# Patient Record
Sex: Female | Born: 1950
Health system: Southern US, Community
[De-identification: ages and names within clinical notes are randomized; demographics above are authoritative.]

## PROBLEM LIST (undated history)

## (undated) DIAGNOSIS — D751 Secondary polycythemia: Secondary | ICD-10-CM

## (undated) DIAGNOSIS — Z87410 Personal history of cervical dysplasia: Secondary | ICD-10-CM

## (undated) DIAGNOSIS — M810 Age-related osteoporosis without current pathological fracture: Secondary | ICD-10-CM

## (undated) DIAGNOSIS — K529 Noninfective gastroenteritis and colitis, unspecified: Secondary | ICD-10-CM

## (undated) DIAGNOSIS — T8859XA Other complications of anesthesia, initial encounter: Secondary | ICD-10-CM

## (undated) DIAGNOSIS — S62609A Fracture of unspecified phalanx of unspecified finger, initial encounter for closed fracture: Secondary | ICD-10-CM

## (undated) DIAGNOSIS — Z8673 Personal history of transient ischemic attack (TIA), and cerebral infarction without residual deficits: Secondary | ICD-10-CM

## (undated) DIAGNOSIS — K52832 Lymphocytic colitis: Secondary | ICD-10-CM

## (undated) DIAGNOSIS — T4145XA Adverse effect of unspecified anesthetic, initial encounter: Secondary | ICD-10-CM

## (undated) DIAGNOSIS — I10 Essential (primary) hypertension: Secondary | ICD-10-CM

## (undated) DIAGNOSIS — G4730 Sleep apnea, unspecified: Secondary | ICD-10-CM

## (undated) DIAGNOSIS — E785 Hyperlipidemia, unspecified: Secondary | ICD-10-CM

## (undated) DIAGNOSIS — J45909 Unspecified asthma, uncomplicated: Secondary | ICD-10-CM

## (undated) DIAGNOSIS — F17200 Nicotine dependence, unspecified, uncomplicated: Secondary | ICD-10-CM

## (undated) DIAGNOSIS — Z9889 Other specified postprocedural states: Secondary | ICD-10-CM

## (undated) DIAGNOSIS — K449 Diaphragmatic hernia without obstruction or gangrene: Secondary | ICD-10-CM

## (undated) DIAGNOSIS — H3530 Unspecified macular degeneration: Secondary | ICD-10-CM

## (undated) DIAGNOSIS — Z8489 Family history of other specified conditions: Secondary | ICD-10-CM

## (undated) DIAGNOSIS — F411 Generalized anxiety disorder: Secondary | ICD-10-CM

## (undated) DIAGNOSIS — Z9289 Personal history of other medical treatment: Secondary | ICD-10-CM

## (undated) DIAGNOSIS — J439 Emphysema, unspecified: Secondary | ICD-10-CM

## (undated) DIAGNOSIS — G4733 Obstructive sleep apnea (adult) (pediatric): Secondary | ICD-10-CM

## (undated) DIAGNOSIS — F419 Anxiety disorder, unspecified: Secondary | ICD-10-CM

## (undated) DIAGNOSIS — R002 Palpitations: Secondary | ICD-10-CM

## (undated) DIAGNOSIS — R112 Nausea with vomiting, unspecified: Secondary | ICD-10-CM

## (undated) DIAGNOSIS — N879 Dysplasia of cervix uteri, unspecified: Secondary | ICD-10-CM

## (undated) DIAGNOSIS — I4729 Other ventricular tachycardia: Secondary | ICD-10-CM

## (undated) DIAGNOSIS — M8580 Other specified disorders of bone density and structure, unspecified site: Secondary | ICD-10-CM

## (undated) DIAGNOSIS — J189 Pneumonia, unspecified organism: Secondary | ICD-10-CM

## (undated) DIAGNOSIS — K219 Gastro-esophageal reflux disease without esophagitis: Secondary | ICD-10-CM

## (undated) HISTORY — DX: Anxiety disorder, unspecified: F41.9

## (undated) HISTORY — PX: ROTATOR CUFF REPAIR: SHX139

## (undated) HISTORY — PX: TONSILLECTOMY: SUR1361

## (undated) HISTORY — PX: NOSE SURGERY: SHX723

## (undated) HISTORY — PX: OOPHORECTOMY: SHX86

## (undated) HISTORY — PX: SHOULDER SURGERY: SHX246

## (undated) HISTORY — PX: GYNECOLOGIC CRYOSURGERY: SHX857

## (undated) HISTORY — PX: COLPOSCOPY: SHX161

## (undated) HISTORY — DX: Hyperlipidemia, unspecified: E78.5

## (undated) HISTORY — DX: Other specified disorders of bone density and structure, unspecified site: M85.80

## (undated) HISTORY — DX: Dysplasia of cervix uteri, unspecified: N87.9

## (undated) HISTORY — PX: THROAT SURGERY: SHX803

## (undated) HISTORY — DX: Personal history of other medical treatment: Z92.89

---
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
## 1995-04-27 HISTORY — PX: THROAT SURGERY: SHX803

## 1997-11-12 ENCOUNTER — Other Ambulatory Visit: Admission: RE | Admit: 1997-11-12 | Discharge: 1997-11-12 | Payer: Self-pay | Admitting: Obstetrics and Gynecology

## 1997-12-14 ENCOUNTER — Emergency Department (HOSPITAL_COMMUNITY): Admission: EM | Admit: 1997-12-14 | Discharge: 1997-12-14 | Payer: Self-pay | Admitting: Emergency Medicine

## 1997-12-25 ENCOUNTER — Inpatient Hospital Stay (HOSPITAL_COMMUNITY): Admission: EM | Admit: 1997-12-25 | Discharge: 1997-12-29 | Payer: Self-pay | Admitting: Emergency Medicine

## 1997-12-25 ENCOUNTER — Encounter: Payer: Self-pay | Admitting: Emergency Medicine

## 1998-01-29 ENCOUNTER — Encounter: Admission: RE | Admit: 1998-01-29 | Discharge: 1998-01-29 | Payer: Self-pay | Admitting: Infectious Diseases

## 1998-01-31 ENCOUNTER — Ambulatory Visit (HOSPITAL_COMMUNITY): Admission: RE | Admit: 1998-01-31 | Discharge: 1998-01-31 | Payer: Self-pay | Admitting: Infectious Diseases

## 1998-04-11 ENCOUNTER — Encounter: Payer: Self-pay | Admitting: Internal Medicine

## 1998-04-11 ENCOUNTER — Ambulatory Visit (HOSPITAL_COMMUNITY): Admission: RE | Admit: 1998-04-11 | Discharge: 1998-04-11 | Payer: Self-pay | Admitting: Internal Medicine

## 1998-04-26 HISTORY — PX: VAGINAL HYSTERECTOMY: SUR661

## 1998-04-30 ENCOUNTER — Ambulatory Visit (HOSPITAL_COMMUNITY): Admission: RE | Admit: 1998-04-30 | Discharge: 1998-04-30 | Payer: Self-pay | Admitting: Internal Medicine

## 1998-06-05 ENCOUNTER — Ambulatory Visit (HOSPITAL_COMMUNITY): Admission: RE | Admit: 1998-06-05 | Discharge: 1998-06-05 | Payer: Self-pay | Admitting: Internal Medicine

## 1998-06-05 ENCOUNTER — Encounter: Payer: Self-pay | Admitting: Internal Medicine

## 1998-08-11 ENCOUNTER — Other Ambulatory Visit: Admission: RE | Admit: 1998-08-11 | Discharge: 1998-08-11 | Payer: Self-pay | Admitting: Obstetrics and Gynecology

## 1998-08-27 ENCOUNTER — Encounter: Payer: Self-pay | Admitting: Internal Medicine

## 1998-08-27 ENCOUNTER — Ambulatory Visit (HOSPITAL_COMMUNITY): Admission: RE | Admit: 1998-08-27 | Discharge: 1998-08-27 | Payer: Self-pay | Admitting: Internal Medicine

## 1998-10-01 ENCOUNTER — Ambulatory Visit (HOSPITAL_COMMUNITY): Admission: RE | Admit: 1998-10-01 | Discharge: 1998-10-01 | Payer: Self-pay | Admitting: Obstetrics and Gynecology

## 1998-12-23 ENCOUNTER — Other Ambulatory Visit: Admission: RE | Admit: 1998-12-23 | Discharge: 1998-12-23 | Payer: Self-pay | Admitting: Obstetrics and Gynecology

## 1999-01-14 ENCOUNTER — Encounter: Payer: Self-pay | Admitting: Internal Medicine

## 1999-01-14 ENCOUNTER — Ambulatory Visit (HOSPITAL_COMMUNITY): Admission: RE | Admit: 1999-01-14 | Discharge: 1999-01-14 | Payer: Self-pay | Admitting: Internal Medicine

## 1999-01-22 ENCOUNTER — Inpatient Hospital Stay (HOSPITAL_COMMUNITY): Admission: RE | Admit: 1999-01-22 | Discharge: 1999-01-24 | Payer: Self-pay | Admitting: Obstetrics and Gynecology

## 1999-02-05 ENCOUNTER — Encounter: Payer: Self-pay | Admitting: Obstetrics and Gynecology

## 1999-02-05 ENCOUNTER — Ambulatory Visit (HOSPITAL_COMMUNITY): Admission: RE | Admit: 1999-02-05 | Discharge: 1999-02-05 | Payer: Self-pay | Admitting: Obstetrics and Gynecology

## 1999-06-27 ENCOUNTER — Encounter: Payer: Self-pay | Admitting: Gastroenterology

## 1999-06-27 ENCOUNTER — Ambulatory Visit (HOSPITAL_COMMUNITY): Admission: RE | Admit: 1999-06-27 | Discharge: 1999-06-27 | Payer: Self-pay | Admitting: Gastroenterology

## 1999-09-28 ENCOUNTER — Encounter (INDEPENDENT_AMBULATORY_CARE_PROVIDER_SITE_OTHER): Payer: Self-pay

## 1999-09-28 ENCOUNTER — Ambulatory Visit (HOSPITAL_COMMUNITY): Admission: RE | Admit: 1999-09-28 | Discharge: 1999-09-28 | Payer: Self-pay | Admitting: Gastroenterology

## 1999-12-16 ENCOUNTER — Ambulatory Visit (HOSPITAL_COMMUNITY): Admission: RE | Admit: 1999-12-16 | Discharge: 1999-12-16 | Payer: Self-pay | Admitting: Neurosurgery

## 1999-12-16 ENCOUNTER — Encounter: Payer: Self-pay | Admitting: Neurosurgery

## 2000-02-28 ENCOUNTER — Encounter: Payer: Self-pay | Admitting: Internal Medicine

## 2000-02-28 ENCOUNTER — Inpatient Hospital Stay (HOSPITAL_COMMUNITY): Admission: EM | Admit: 2000-02-28 | Discharge: 2000-03-01 | Payer: Self-pay | Admitting: *Deleted

## 2000-02-29 ENCOUNTER — Encounter: Payer: Self-pay | Admitting: Internal Medicine

## 2000-04-04 ENCOUNTER — Other Ambulatory Visit: Admission: RE | Admit: 2000-04-04 | Discharge: 2000-04-04 | Payer: Self-pay | Admitting: Obstetrics and Gynecology

## 2000-04-26 HISTORY — PX: BACK SURGERY: SHX140

## 2000-07-30 ENCOUNTER — Ambulatory Visit (HOSPITAL_COMMUNITY): Admission: RE | Admit: 2000-07-30 | Discharge: 2000-07-30 | Payer: Self-pay | Admitting: Neurosurgery

## 2000-07-30 ENCOUNTER — Encounter: Payer: Self-pay | Admitting: Neurosurgery

## 2000-07-31 ENCOUNTER — Encounter: Payer: Self-pay | Admitting: Neurosurgery

## 2000-08-12 ENCOUNTER — Encounter: Payer: Self-pay | Admitting: Neurosurgery

## 2000-08-15 ENCOUNTER — Inpatient Hospital Stay (HOSPITAL_COMMUNITY): Admission: RE | Admit: 2000-08-15 | Discharge: 2000-08-16 | Payer: Self-pay | Admitting: Neurosurgery

## 2000-08-15 ENCOUNTER — Encounter: Payer: Self-pay | Admitting: Neurosurgery

## 2000-08-15 HISTORY — PX: LUMBAR DISC SURGERY: SHX700

## 2000-09-07 ENCOUNTER — Ambulatory Visit (HOSPITAL_COMMUNITY): Admission: RE | Admit: 2000-09-07 | Discharge: 2000-09-07 | Payer: Self-pay | Admitting: Neurosurgery

## 2000-09-07 ENCOUNTER — Encounter: Payer: Self-pay | Admitting: Neurosurgery

## 2001-04-07 ENCOUNTER — Other Ambulatory Visit: Admission: RE | Admit: 2001-04-07 | Discharge: 2001-04-07 | Payer: Self-pay | Admitting: Obstetrics and Gynecology

## 2001-10-10 ENCOUNTER — Encounter: Payer: Self-pay | Admitting: Internal Medicine

## 2001-10-10 ENCOUNTER — Ambulatory Visit (HOSPITAL_COMMUNITY): Admission: RE | Admit: 2001-10-10 | Discharge: 2001-10-10 | Payer: Self-pay | Admitting: Internal Medicine

## 2001-10-24 ENCOUNTER — Encounter: Payer: Self-pay | Admitting: *Deleted

## 2001-10-24 ENCOUNTER — Encounter: Admission: RE | Admit: 2001-10-24 | Discharge: 2001-10-24 | Payer: Self-pay | Admitting: *Deleted

## 2002-01-30 ENCOUNTER — Ambulatory Visit (HOSPITAL_COMMUNITY): Admission: RE | Admit: 2002-01-30 | Discharge: 2002-01-30 | Payer: Self-pay | Admitting: *Deleted

## 2002-04-26 HISTORY — PX: NASAL SINUS SURGERY: SHX719

## 2002-05-25 ENCOUNTER — Other Ambulatory Visit: Admission: RE | Admit: 2002-05-25 | Discharge: 2002-05-25 | Payer: Self-pay | Admitting: Obstetrics and Gynecology

## 2002-07-31 ENCOUNTER — Encounter: Payer: Self-pay | Admitting: Gastroenterology

## 2002-07-31 ENCOUNTER — Encounter: Admission: RE | Admit: 2002-07-31 | Discharge: 2002-07-31 | Payer: Self-pay | Admitting: Gastroenterology

## 2002-09-07 ENCOUNTER — Ambulatory Visit (HOSPITAL_COMMUNITY): Admission: RE | Admit: 2002-09-07 | Discharge: 2002-09-07 | Payer: Self-pay | Admitting: Gastroenterology

## 2002-10-01 ENCOUNTER — Encounter: Admission: RE | Admit: 2002-10-01 | Discharge: 2002-10-01 | Payer: Self-pay | Admitting: Gastroenterology

## 2002-10-01 ENCOUNTER — Encounter: Payer: Self-pay | Admitting: Gastroenterology

## 2002-10-08 ENCOUNTER — Ambulatory Visit (HOSPITAL_COMMUNITY): Admission: RE | Admit: 2002-10-08 | Discharge: 2002-10-08 | Payer: Self-pay | Admitting: Gastroenterology

## 2002-10-08 ENCOUNTER — Encounter: Payer: Self-pay | Admitting: Gastroenterology

## 2002-10-15 ENCOUNTER — Encounter (INDEPENDENT_AMBULATORY_CARE_PROVIDER_SITE_OTHER): Payer: Self-pay | Admitting: *Deleted

## 2002-10-15 ENCOUNTER — Ambulatory Visit (HOSPITAL_COMMUNITY): Admission: RE | Admit: 2002-10-15 | Discharge: 2002-10-15 | Payer: Self-pay | Admitting: *Deleted

## 2002-10-15 HISTORY — PX: LAPAROSCOPIC CHOLECYSTECTOMY: SUR755

## 2002-11-19 ENCOUNTER — Encounter: Payer: Self-pay | Admitting: Internal Medicine

## 2002-11-19 ENCOUNTER — Observation Stay (HOSPITAL_COMMUNITY): Admission: EM | Admit: 2002-11-19 | Discharge: 2002-11-21 | Payer: Self-pay | Admitting: Emergency Medicine

## 2002-11-20 HISTORY — PX: CARDIAC CATHETERIZATION: SHX172

## 2003-07-09 ENCOUNTER — Other Ambulatory Visit: Admission: RE | Admit: 2003-07-09 | Discharge: 2003-07-09 | Payer: Self-pay | Admitting: Obstetrics and Gynecology

## 2003-10-24 ENCOUNTER — Encounter: Admission: RE | Admit: 2003-10-24 | Discharge: 2003-10-24 | Payer: Self-pay | Admitting: Gastroenterology

## 2004-04-26 HISTORY — PX: CHOLECYSTECTOMY: SHX55

## 2004-07-28 ENCOUNTER — Other Ambulatory Visit: Admission: RE | Admit: 2004-07-28 | Discharge: 2004-07-28 | Payer: Self-pay | Admitting: Obstetrics and Gynecology

## 2004-09-14 ENCOUNTER — Encounter: Admission: RE | Admit: 2004-09-14 | Discharge: 2004-09-14 | Payer: Self-pay | Admitting: Gastroenterology

## 2004-10-08 ENCOUNTER — Ambulatory Visit (HOSPITAL_COMMUNITY): Admission: RE | Admit: 2004-10-08 | Discharge: 2004-10-08 | Payer: Self-pay | Admitting: Gastroenterology

## 2005-01-29 ENCOUNTER — Ambulatory Visit (HOSPITAL_COMMUNITY): Admission: RE | Admit: 2005-01-29 | Discharge: 2005-01-29 | Payer: Self-pay | Admitting: Gastroenterology

## 2005-06-18 ENCOUNTER — Encounter: Admission: RE | Admit: 2005-06-18 | Discharge: 2005-06-18 | Payer: Self-pay | Admitting: Internal Medicine

## 2005-08-25 ENCOUNTER — Encounter: Payer: Self-pay | Admitting: Internal Medicine

## 2005-09-06 ENCOUNTER — Other Ambulatory Visit: Admission: RE | Admit: 2005-09-06 | Discharge: 2005-09-06 | Payer: Self-pay | Admitting: Obstetrics and Gynecology

## 2006-01-21 ENCOUNTER — Ambulatory Visit (HOSPITAL_COMMUNITY): Admission: RE | Admit: 2006-01-21 | Discharge: 2006-01-21 | Payer: Self-pay | Admitting: Neurology

## 2006-09-13 ENCOUNTER — Other Ambulatory Visit: Admission: RE | Admit: 2006-09-13 | Discharge: 2006-09-13 | Payer: Self-pay | Admitting: Obstetrics and Gynecology

## 2007-09-19 ENCOUNTER — Other Ambulatory Visit: Admission: RE | Admit: 2007-09-19 | Discharge: 2007-09-19 | Payer: Self-pay | Admitting: Obstetrics and Gynecology

## 2007-10-19 ENCOUNTER — Ambulatory Visit (HOSPITAL_COMMUNITY): Admission: RE | Admit: 2007-10-19 | Discharge: 2007-10-19 | Payer: Self-pay | Admitting: Internal Medicine

## 2008-10-01 ENCOUNTER — Ambulatory Visit: Payer: Self-pay | Admitting: Obstetrics and Gynecology

## 2008-10-01 ENCOUNTER — Encounter: Payer: Self-pay | Admitting: Obstetrics and Gynecology

## 2008-10-01 ENCOUNTER — Other Ambulatory Visit: Admission: RE | Admit: 2008-10-01 | Discharge: 2008-10-01 | Payer: Self-pay | Admitting: Obstetrics and Gynecology

## 2009-10-02 ENCOUNTER — Other Ambulatory Visit: Admission: RE | Admit: 2009-10-02 | Discharge: 2009-10-02 | Payer: Self-pay | Admitting: Obstetrics and Gynecology

## 2009-10-02 ENCOUNTER — Ambulatory Visit: Payer: Self-pay | Admitting: Obstetrics and Gynecology

## 2009-10-15 ENCOUNTER — Ambulatory Visit (HOSPITAL_COMMUNITY): Admission: RE | Admit: 2009-10-15 | Discharge: 2009-10-15 | Payer: Self-pay | Admitting: Obstetrics and Gynecology

## 2009-12-24 ENCOUNTER — Ambulatory Visit: Payer: Self-pay | Admitting: Women's Health

## 2010-03-25 ENCOUNTER — Encounter: Admission: RE | Admit: 2010-03-25 | Discharge: 2010-03-25 | Payer: Self-pay | Admitting: Internal Medicine

## 2010-06-28 ENCOUNTER — Inpatient Hospital Stay (HOSPITAL_COMMUNITY)
Admission: EM | Admit: 2010-06-28 | Discharge: 2010-06-30 | DRG: 313 | Disposition: A | Discharge status: Home / Self Care | Payer: 59 | Attending: Internal Medicine | Admitting: Internal Medicine

## 2010-06-28 ENCOUNTER — Emergency Department (HOSPITAL_COMMUNITY): Payer: 59

## 2010-06-28 DIAGNOSIS — E785 Hyperlipidemia, unspecified: Secondary | ICD-10-CM | POA: Diagnosis present

## 2010-06-28 DIAGNOSIS — E876 Hypokalemia: Secondary | ICD-10-CM | POA: Diagnosis present

## 2010-06-28 DIAGNOSIS — I498 Other specified cardiac arrhythmias: Secondary | ICD-10-CM | POA: Diagnosis present

## 2010-06-28 DIAGNOSIS — K589 Irritable bowel syndrome without diarrhea: Secondary | ICD-10-CM | POA: Diagnosis present

## 2010-06-28 DIAGNOSIS — J45909 Unspecified asthma, uncomplicated: Secondary | ICD-10-CM | POA: Diagnosis present

## 2010-06-28 DIAGNOSIS — F1010 Alcohol abuse, uncomplicated: Secondary | ICD-10-CM | POA: Diagnosis present

## 2010-06-28 DIAGNOSIS — R42 Dizziness and giddiness: Secondary | ICD-10-CM | POA: Diagnosis present

## 2010-06-28 DIAGNOSIS — K219 Gastro-esophageal reflux disease without esophagitis: Secondary | ICD-10-CM | POA: Diagnosis present

## 2010-06-28 DIAGNOSIS — I10 Essential (primary) hypertension: Secondary | ICD-10-CM | POA: Diagnosis present

## 2010-06-28 DIAGNOSIS — R0789 Other chest pain: Principal | ICD-10-CM | POA: Diagnosis present

## 2010-06-28 DIAGNOSIS — F17200 Nicotine dependence, unspecified, uncomplicated: Secondary | ICD-10-CM | POA: Diagnosis present

## 2010-06-28 DIAGNOSIS — F411 Generalized anxiety disorder: Secondary | ICD-10-CM | POA: Diagnosis present

## 2010-06-28 DIAGNOSIS — Z7982 Long term (current) use of aspirin: Secondary | ICD-10-CM

## 2010-06-28 HISTORY — DX: Essential (primary) hypertension: I10

## 2010-06-28 HISTORY — DX: Nicotine dependence, unspecified, uncomplicated: F17.200

## 2010-06-28 LAB — DIFFERENTIAL
Eosinophils Relative: 1 % (ref 0–5)
Monocytes Absolute: 0.5 10*3/uL (ref 0.1–1.0)

## 2010-06-28 LAB — COMPREHENSIVE METABOLIC PANEL
Alkaline Phosphatase: 69 U/L (ref 39–117)
CO2: 26 mEq/L (ref 19–32)
Creatinine, Ser: 0.66 mg/dL (ref 0.4–1.2)
GFR calc Af Amer: >60 mL/min (ref >60)
Potassium: 4.5 mEq/L (ref 3.5–5.1)
Sodium: 139 mEq/L (ref 135–145)

## 2010-06-28 LAB — POCT CARDIAC MARKERS
Myoglobin, poc: 30.4 ng/mL (ref 12–200)
Myoglobin, poc: 33.7 ng/mL (ref 12–200)
Troponin i, poc: <0.05 ng/mL (ref 0.00–0.09)
Troponin i, poc: <0.05 ng/mL (ref 0.00–0.09)

## 2010-06-28 LAB — CBC
HCT: 44.8 % (ref 36.0–46.0)
MCHC: 32.1 g/dL (ref 30.0–36.0)
Platelets: 201 10*3/uL (ref 150–400)
WBC: 7.3 10*3/uL (ref 4.0–10.5)

## 2010-06-28 LAB — PROTIME-INR
INR: 0.84 (ref 0.00–1.49)
Prothrombin Time: 11.7 seconds (ref 11.6–15.2)

## 2010-06-28 LAB — GLUCOSE, CAPILLARY: Glucose-Capillary: 121 mg/dL — ABNORMAL HIGH (ref 70–99)

## 2010-06-28 LAB — APTT: aPTT: 27 seconds (ref 24–37)

## 2010-06-29 ENCOUNTER — Inpatient Hospital Stay (HOSPITAL_COMMUNITY): Payer: 59

## 2010-06-29 ENCOUNTER — Encounter (HOSPITAL_COMMUNITY): Payer: Self-pay | Admitting: Radiology

## 2010-06-29 LAB — CARDIAC PANEL(CRET KIN+CKTOT+MB+TROPI)
CK, MB: 0.7 ng/mL (ref 0.3–4.0)
CK, MB: 0.9 ng/mL (ref 0.3–4.0)
Relative Index: INVALID (ref 0.0–2.5)
Relative Index: INVALID (ref 0.0–2.5)
Total CK: 23 U/L (ref 7–177)
Troponin I: 0.01 ng/mL (ref 0.00–0.06)
Troponin I: <0.01 ng/mL (ref 0.00–0.06)

## 2010-06-29 LAB — LIPID PANEL
HDL: 74 mg/dL (ref >39)
Total CHOL/HDL Ratio: 2.3 RATIO
Triglycerides: 91 mg/dL (ref <150)
VLDL: 18 mg/dL (ref 0–40)

## 2010-06-29 LAB — COMPREHENSIVE METABOLIC PANEL
ALT: 23 U/L (ref 0–35)
AST: 24 U/L (ref 0–37)
Alkaline Phosphatase: 61 U/L (ref 39–117)
BUN: 8 mg/dL (ref 6–23)
CO2: 27 mEq/L (ref 19–32)
Calcium: 9 mg/dL (ref 8.4–10.5)
Creatinine, Ser: 0.66 mg/dL (ref 0.4–1.2)
GFR calc Af Amer: >60 mL/min (ref >60)
Total Bilirubin: 0.5 mg/dL (ref 0.3–1.2)

## 2010-06-29 LAB — PROTIME-INR: INR: 0.85 (ref 0.00–1.49)

## 2010-06-29 LAB — BASIC METABOLIC PANEL
BUN: 8 mg/dL (ref 6–23)
CO2: 27 mEq/L (ref 19–32)
Chloride: 105 mEq/L (ref 96–112)
GFR calc non Af Amer: >60 mL/min (ref >60)
Glucose, Bld: 117 mg/dL — ABNORMAL HIGH (ref 70–99)
Potassium: 3.1 mEq/L — ABNORMAL LOW (ref 3.5–5.1)
Sodium: 139 mEq/L (ref 135–145)

## 2010-06-29 LAB — URINALYSIS, ROUTINE W REFLEX MICROSCOPIC
Bilirubin Urine: NEGATIVE
Hgb urine dipstick: NEGATIVE
Ketones, ur: NEGATIVE mg/dL
Urobilinogen, UA: 0.2 mg/dL (ref 0.0–1.0)

## 2010-06-29 LAB — CBC
HCT: 42.4 % (ref 36.0–46.0)
Hemoglobin: 14.3 g/dL (ref 12.0–15.0)
WBC: 6.3 10*3/uL (ref 4.0–10.5)

## 2010-06-29 LAB — RAPID URINE DRUG SCREEN, HOSP PERFORMED
Barbiturates: NOT DETECTED
Tetrahydrocannabinol: NOT DETECTED

## 2010-06-29 LAB — D-DIMER, QUANTITATIVE: D-Dimer, Quant: 0.35 ug/mL-FEU (ref 0.00–0.48)

## 2010-06-30 LAB — BASIC METABOLIC PANEL
Calcium: 8.9 mg/dL (ref 8.4–10.5)
GFR calc Af Amer: >60 mL/min (ref >60)
GFR calc non Af Amer: >60 mL/min (ref >60)
Glucose, Bld: 94 mg/dL (ref 70–99)
Potassium: 4.2 mEq/L (ref 3.5–5.1)
Sodium: 141 mEq/L (ref 135–145)

## 2010-07-06 NOTE — Discharge Summary (Signed)
NAMEGERALDIN, [name]               ACCOUNT NO.:  [id number]  MEDICAL RECORD NO.:  [id number]           PATIENT TYPE:  I  LOCATION:  2035                         FACILITY:  [facility]  PHYSICIAN:  Jeoffrey Massed, MD    DATE OF BIRTH:  02/26/1951  DATE OF ADMISSION:  06/28/2010 DATE OF DISCHARGE:                        DISCHARGE SUMMARY - REFERRING   PRIMARY CARE PRACTITIONER:  Juline Patch, [name]  PRIMARY CARDIOLOGIST:  [place]aly Hilty, MD  PRIMARY DISCHARGE DIAGNOSES: 1. Dizziness of uncertain etiology, now resolved. 2. Atypical chest pain. 3. Hypertensive urgency. 4. Subpleural lymph node in the left lower lobe need to repeat CT     chest in 6 months' time.  SECONDARY DISCHARGE DIAGNOSES: 1. Longstanding history of hypertension. 2. Dyslipidemia. 3. Gastroesophageal reflux disease. 4. Tobacco abuse. 5. Bronchial asthma. 6. Questionable irritable bowel syndrome. 7. Occasionally anxiety.  DISCHARGE MEDICATIONS: 1. Amlodipine 10 mg 1 tablet daily. 2. Isosorbide mononitrate 30 mg 1 tablet p.o. daily. 3. Metoprolol 25 mg 1 tablet p.o. daily. 4. Crestor 10 mg 1 tablet p.o. daily. 5. Aspirin 81 mg 1 tablet p.o. daily. 6. Cholestyramine 1 packet p.o. daily. 7. Clonidine 0.1 mg 1 tablet p.o. b.i.d. 8. Extra Strength Tylenol 500 mg 1 tablet p.o. q.4 p.r.n. 9. Vitamin D2 50,000 units 1 capsule weekly. 10.Xanax 0.25 mg 1 tablet p.o. daily p.r.n. 11.Xopenex inhaler 1 puff inhaled daily p.r.n.  CONSULTATIONS:  [facility].  BRIEF HISTORY OF PRESENT ILLNESS:  The patient is a very pleasant 60- year-old female who came in on June 28, 2010, with a vague complaints of dizziness, lightheadedness and intermittent left-sided chest pain. Apparently, this was going on for 2 weeks prior to admission.  She did not have dysarthria.  She did not have any focal weakness.  She was then admitted to the hospitalist service for further management and treatment.  For further  details please see the history and physical that was dictated by Dr. Susie Cassette on admission.  PERTINENT RADIOLOGICAL STUDIES: 1. CT of the head done on June 28, 2010 was negative for any acute     intracranial abnormality. 2. X-ray of the chest 2-views on June 27, 2009 showed a 5-mm nodular     density in the right upper lung field.  No prior chest radiographs     for comparison. 3. MRI of the brain without contrast shows a negative noncontrast MRI     appearance to the brain and no significant change since 2007. 4. CT of the chest without contrast shows no evidence of right upper     lung lobe nodule.  Moderate central lobular emphysema.  Probable     subpleural lymph node in the left lower lobe.  If the patient is at     high risk for bronchogenic carcinoma follow-up CT is recommended at     6-12 months. 5. Ultrasound of the aorta showed no evidence of abdominal aortic     aneurysm.  PERTINENT LABORATORY DATA: 1. HbA1c is 5.6. 2. Cardiac enzymes were cycled and these were negative. 3. LDL cholesterol is 76. 4. Urine drug screen was negative. 5. D-dimer was 0.35.  BRIEF HOSPITAL COURSE: 1. Dizziness, lightheadedness.  The patient was admitted with these     complaints.  This was apparently going on for 2 weeks.  The     etiology of this is still uncertain, however, it has resolved.  She     had a CT of the head and MRI of the brain which are essentially     negative.  Her EKG basically showed sinus bradycardia.  Perhaps     this was all attributable to elevated blood pressure that was     noticed on admission. 2. Left-sided chest pain.  The patient did give a history of left-     sided chest pain.  Given the risk factors [facility] was consulted and the patient was put in for a     stress test.  However, that could not be completed today.  Dr.     Rennis Golden from [facility] did see this patient     today and did recommend that the  stress test can be done as an     outpatient.  He also noticed some hypokalemia in her labs yesterday     and has drawn renin and aldosterone levels and he will follow up     these values when the patient follows up with him.  Per his note     his office would call the patient with an appointment. 3. Uncontrolled hypertension.  The patient had very elevated blood     pressure on admission and was started on the medications as noted     above.  With this regimen blood pressure is mostly well controlled     with the highest systolically in the 150s.  Current plans are to     continue this medication.  If she were to develop a headache that     was not responding to Tylenol and she can discontinue the Imdur.     However, she will need her metoprolol dose to be increased back to     50 mg.  This was all explained to the patient and the family in     great detail by me. 4. Tobacco abuse.  She has been counseled extensively by me and also     by our tobacco cessation team.  She claims understanding and will     try and quit the habit as soon as possible. 5. Questionable subpleural lymph node on the left side.  She will need     a repeat CT scan of the chest done in 6 months to assess the lymph     node as she is a smoker.  This was also explained to the patient.     We will defer all of this to her primary doctors. 6. Dyslipidemia.  Pravastatin has now been changed to Crestor.  DISPOSITION:  The patient will be discharged home.  FOLLOWUP INSTRUCTIONS: 1. The patient will follow up with the primary care practitioner, Dr.     Ricki Miller within 1 week.  She is to call and make an appointment. 2. Dr. Rennis Golden from [facility] will follow this     patient up as an outpatient for further stress testing and other     workup for secondary causes of hypertension.  Per his note, his     office will call for an appointment. 3. The patient will need repeat CT of the chest done in 6-12  months  to     assess the left subtotal lymph node as she is a smoker. 4. Total time spent 45 minutes.     Jeoffrey Massed, MD     SG/MEDQ  D:  06/30/2010  T:  06/30/2010  Job:  [id number]  cc:   Juline Patch, [name] [place] Hilty, MD  Electronically Signed by Jeoffrey Massed  on 07/06/2010 03:19:06 PM

## 2010-07-07 DIAGNOSIS — Z9289 Personal history of other medical treatment: Secondary | ICD-10-CM

## 2010-07-07 HISTORY — DX: Personal history of other medical treatment: Z92.89

## 2010-07-08 LAB — ALDOSTERONE + RENIN ACTIVITY W/ RATIO
ALDO / PRA Ratio: 18.2 Ratio (ref 0.9–28.9)
Aldosterone: 2 ng/dL

## 2010-08-01 NOTE — H&P (Signed)
NAMEKOMAL, STANGELO               ACCOUNT NO.:  [id number]  MEDICAL RECORD NO.:  [id number]           PATIENT TYPE:  E  LOCATION:  [facility]                         FACILITY:  [facility]  PHYSICIAN:  Richarda Overlie, MD       DATE OF BIRTH:  04-17-1951  DATE OF ADMISSION:  06/28/2010 DATE OF DISCHARGE:                             HISTORY & PHYSICAL   PRIMARY CARE PHYSICIAN:  Dr. Renne Crigler.  CHIEF COMPLAINT:  Altered mental status.  SUBJECTIVE:  A 60 year old female who presents to the ED with a chief complaint of altered mental status.  The patient states that for the last 2 weeks she has had episodes "feeling swimming headed" as well as dizziness on and off described as lightheadedness, but without any syncopal or near-syncopal episodes.  Occasionally, she has been disoriented to her surroundings and failed to recognize people.  Today, the episode was prolonged and lasted about 1.5-2 hours while the patient was driving from [place] to [place].  After she reached [place], the patient was brought back to [place] by her daughter and brought to the ER for further evaluation.  During this episode, the patient also had a sharp left-sided chest pain under her left breast radiating to her arm associated with numbness and tingling of her left arm, but no obvious shortness of breath.  The patient denies any fever, chills, rigors, or cough over the last few days.  She has lost about 8-10 pounds and has been under a lot of stress.  She attributes her weight loss to her job.  She continues to smoke less than a pack a day and has smoked for the last several years.  She denies any orthopnea, paroxysmal nocturnal dyspnea, or dependent edema.  She occasionally also has epigastric and periumbilical pain, not particularly related to food. She has diarrhea chronically and goes about 4-6 times a day, but denies any blood in the stool or black tarry stools.  She denies any nausea or vomiting.  PAST MEDICAL  HISTORY: 1. History of hypertension. 2. Status post laparoscopic cholecystectomy in June 2004. 3. Small internal hemorrhoids by colonoscopy in 2004. 4. EGD in October 2003 was unremarkable per Dr. Virginia Rochester. 5. Occasional anxiety, tobacco abuse. 6. Gastroesophageal reflux disease. 7. Status post total abdominal hysterectomy secondary to dysfunctional     uterine bleeding in 2000. 8. Status post history of rotator cuff surgeries in the past. 9. History of two lumbar back surgeries. 10.Status post sinus surgery in the past. 11.Status post removal of a benign tumor from throat in 1997. 12.Status post bilateral tubal ligation in the past. 13.Status post cardiac catheterization by Dr. Kristen Cardinal in     2004, which was normal with normal left ventricular function.  MEDICATIONS: 1. Norvasc. 2. Cholestyramine. 3. Clonidine. 4. Xopenex. 5. Pravastatin. 6. Metoprolol.  ALLERGIES:  MORPHINE AND CONTRAST STUDIES.  FAMILY HISTORY:  The patient's mother died at the age of 25 of lung disease and liver cancer.  The death of her father is unknown.  Her maternal grandfather had his first myocardial infarction at 59 years of age and subsequently died of a myocardial infarction at  59 years of age. She has a brother who is 31 years old and has hypertension and hepatitis C.  SOCIAL HISTORY:  The patient is divorced, has 2 children, he lives in [place] and works in the [employer].  She is employed full time and finds her job very stressful.  She smokes less than a pack a day.  She drinks 4-5 times a week and occasionally drinks beer and vodka.  REVIEW OF SYSTEMS:  Complete review of systems was done as documented in HPI.  PHYSICAL EXAMINATION:  VITAL SIGNS:  Blood pressure 187/87, pulse of 64, respirations 18, temperature 98.3. GENERAL:  Currently, alert, oriented and comfortable, in no acute cardiopulmonary distress. HEENT: Pupils equal and reactive.  Extraocular movements intact. NECK:   Supple.  No JVD. LUNGS: Clear to auscultation bilaterally.  No wheezes, no crackles or rhonchi. CARDIOVASCULAR:  Regular rate and rhythm.  No murmurs, rubs, or gallops. ABDOMEN:  Soft, nontender, nondistended with mild periumbilical tenderness, but bowel sounds are hyperactive. EXTREMITIES:  Without any cyanosis, clubbing, or edema, or calf muscle tenderness. NEUROLOGIC:  Cranial nerves II-XII grossly intact.  Strength intact in bilateral upper and lower extremities.  Gait is intact.  LABORATORY DATA:  Chest x-ray shows a 5-mm nodular density in the right upper lung field.  No prior chest radiograph for comparison.  CT of the head without contrast shows no acute intracranial abnormality with a tiny hyperdensity in the left basal ganglion that may reflect to prior lacunar infarction.  Recent ultrasound of the abdomen in November 2011 shows hepatic steatosis, abdominal aortic atherosclerosis, status post cholecystectomy.  ASSESSMENT/PLAN: 8. A 60 year old female who presents with altered mental status and     intermittent chest pain. 2. Hypertensive urgency in the setting of neurologic and cardiac     symptoms. 3. Bradycardia associated with metoprolol and clonidine. 4. Nicotine dependence. 5. A 5-mm nodular density in the right upper lung field. 6. Chest pain fairly atypical for anginal pain.  PLAN:  The patient will be admitted to the telemetry floor.  We will admit her for a TIA workup.  We will obtain an MRI of the brain, carotid Doppler, evaluated for risk factors including hemoglobin A1c and a lipid panel.  She has been strongly counseled about cessation of smoking.  She will be started on a full-dose aspirin.  Lung nodule.  The patient will have a CT angiogram in the morning after being prepped for her contrast allergy with prednisone at 13, 7, and 1 hour prior to the CT and Benadryl 1 hour prior to the CT.  The patient is agreeable to this and she states that she has  been prepped in the past with prednisone and Benadryl for her contrast studies.  Hypertension, very poorly controlled at this time.  We will continue with her metoprolol with an attempt to taper off her clonidine over the next few days as possible as this is contributing to her bradycardia. We will decrease the dose of her metoprolol to 25 mg p.o. daily.  We will start her on p.r.n. hydralazine IV as well as Imdur p.o.  Nicotine dependence, a nicotine patch will be provided.  DISPOSITION:  The patient will be admitted primarily for a TIA, CVA workup if she is ruled out.  From the standpoint, she may need an inpatient or an outpatient stress test.     Richarda Overlie, MD     NA/MEDQ  D:  06/28/2010  T:  06/28/2010  Job:  [id number]  Electronically Signed by Richarda Overlie MD on 08/01/2010 08:39:17 PM

## 2010-09-11 NOTE — Op Note (Signed)
[name], [name]                          ACCOUNT NO.:  [id number]   MEDICAL RECORD NO.:  [id number]                   PATIENT TYPE:  OUT   LOCATION:  MRI                                  FACILITY:  [facility]   PHYSICIAN:  Vikki Ports, [name]         DATE OF BIRTH:  15-Aug-1950   DATE OF PROCEDURE:  10/15/2002  DATE OF DISCHARGE:  10/08/2002                                 OPERATIVE REPORT   PREOPERATIVE DIAGNOSES:  Gallbladder polyp, biliary colic.   POSTOPERATIVE DIAGNOSES:  Gallbladder polyp, biliary colic, evidence of  chronic cholecystitis.   OPERATION PERFORMED:  Laparoscopic cholecystectomy with intraoperative  cholangiogram.   SURGEON:  Vikki Ports, [name]   ASSISTANT:  None.   ANESTHESIA:  General.   DESCRIPTION OF PROCEDURE:  The patient was taken to the operating room and  placed in supine position.  After adequate general anesthesia was induced,  using endotracheal tube, the abdomen was prepped and draped in the normal  sterile fashion.  Using a transverse infraumbilical incision, I dissected  down to the fascia.  This was opened vertically.  The peritoneum was entered  and 0 Vicryl pursestring suture was placed around the fascial defect.  A  Hasson trocar was placed in the abdomen and the abdomen was insufflated with  continuous flow carbon dioxide to a measurement of 15 mmHg.  Under direct  visualization, a 10 mm port was placed in the subxiphoid region and two 5 mm  ports were placed in the right abdomen.  Gallbladder was identified and  retracted cephalad.  The duodenum was adherent to the infundibulum of the  gallbladder consistent with chronic cholecystitis.  This was taken down with  sharp dissection.  The infundibulum was then retracted laterally and the  cystic duct was easily identified.  A good window was created behind it.  It  was clipped.  Cholangiogram was not performed because of the patient's  anaphylaxis to contrast dyes.  The  duct was then clipped distally and  divided.  The cystic artery was identified, dissected the identical way,  triply clipped and divided.  The gallbladder was taken off the gallbladder  bed using Bovie electrocautery and removed through the umbilical port.  Adequate hemostasis was ensured.  Pneumoperitoneum was released.  The  infraumbilical fascial defect was closed with the 0 Vicryl pursestring  suture.  Skin was closed with subcuticular 4-0 Monocryl.  Steri-Strips and  sterile dressings were applied.  The patient tolerated the procedure well  and went to PACU in good condition.                                                Vikki Ports, [name]    KRH/MEDQ  D:  10/16/2002  T:  10/17/2002  Job:  [id number]

## 2010-09-11 NOTE — Discharge Summary (Signed)
[place]. [facility]  Patient:    [name], [name]                   MRN: [id number] Adm. Date:  [id number] Disc. Date: [id number] Attending:  Darnelle Bos                           Discharge Summary  ADMISSION DIAGNOSIS:  Amaurosis fugax.  DISCHARGE DIAGNOSES: 1. Amaurosis fugax. 2. Hypertension, poorly controlled. 3. Chronic tobacco use. 4. Surgically postmenopausal on hormone replacement therapy. 5. Diverticulosis.  Please see the admitted History & Physical examination for detail.  HOSPITAL COURSE:  The patient was admitted and immediately started on Norvasc 5 mg daily and a Catapres TTS-1 patch.  Over the subsequent 12 hours, her blood pressure improved although not quite down into the normal levels. Within 48 hours, however, her blood pressure was well within normal limits. She did suffer some headache from the Norvasc and possibly some stomach discomfort from aspirin and Plavix.  She otherwise had no recurrence of her neurologic symptoms and had total recovery of her vision disturbance.  An MRI and MRA were performed, and the results are pending at the time of discharge. Carotid Dopplers revealed no right internal carotid artery stenosis on the right and a 40 to 60% ICA stenosis on the left.  Vertebral flow was antegrade. No clear etiology was noted.  It was presumed this was on the basis of an embolic event from some vascular source, and so the patient will be continued on Plavix.  Aspirin will be discontinued because of her stomach discomfort. We will seek for better blood pressure control with the combination of medications listed below.  CONDITION UPON DISCHARGE:  Improved.  DISCHARGE MEDICATIONS: 1. Catapres TTS-1 patch, apply one weekly. 2. Climara apply weekly. 3. Plavix 75 mg daily. 4. Norvasc 5 mg daily. 5. Nicotine patch p.r.n. 6. Xanax 0.25 mg t.i.d. p.r.n.  ACTIVITY:  As tolerated.  DIET:  No added salt.  SPECIAL  INSTRUCTIONS:  She was instructed not to smoke.  FOLLOWUP:  She will call to make an appointment to see me in two to three weeks. DD:  03/01/00 TD:  03/01/00 Job: [id number]

## 2010-09-11 NOTE — Op Note (Signed)
[name], [name]               ACCOUNT NO.:  [id number]   MEDICAL RECORD NO.:  [id number]          PATIENT TYPE:  AMB   LOCATION:  ENDO                         FACILITY:  [facility]   PHYSICIAN:  Anselmo Rod, [name]  DATE OF BIRTH:  03-14-1951   DATE OF PROCEDURE:  01/29/2005  DATE OF DISCHARGE:                                 OPERATIVE REPORT   PROCEDURE PERFORMED:  Esophagogastroduodenoscopy with Botox injection above  the LES.   ENDOSCOPIST:  Anselmo Rod, [name]   INSTRUMENT USED:  Olympus video panendoscope.   INDICATIONS FOR PROCEDURE:  Hypertensive LES with chest pain in a 60-year-  old white female.  Botox injection planned.   PRE-PROCEDURE PREPARATION:  Informed consent was procured from the patient.  The patient was fasted for eight hours prior to the procedure.  The risks  and benefits of the procedure including perforation, bleeding, etc., were  discussed with her in great detail.   PRE-PROCEDURE PHYSICAL:  VITAL SIGNS:  The patient had stable vital signs.  NECK:  Supple.  CHEST:  Clear to auscultation.  CARDIOVASCULAR:  S1, S2 regular.  ABDOMEN:  Soft, with normal bowel sounds.   DESCRIPTION OF THE PROCEDURE:  The patient was placed in the left lateral  decubitus position, sedated with 50 mg of Demerol and 5 mg of Versed in slow  incremental doses.  Once the patient was adequately sedated and maintained  on low-flow oxygen and continuous cardiac monitoring, the Olympus video  panendoscope was advanced through the mouth, placed over the tongue, into  the esophagus under direct vision.  The entire esophagus appeared normal.  The LES was patent, but as she had hypertensive pressures on esophageal  manometry, 25 units of Botox were injected in every quadrant  circumferentially.  There was minimal bleeding from the injection site.  The  rest of the gastric mucosa and the proximal small bowel appeared normal.  A  small hiatal hernia was seen on high retroflexion.   The patient tolerated  the procedure well, without immediate complications.   IMPRESSION:  1.  Hypertensive lower esophageal sphincter, Botox injected.  2.  Considering her problems with diarrhea after a cholecystectomy, she has      been advised to increase the cholestyramine to 6 g q.12 h.  3.  Continue present medications.  4.  Avoid nonsteroidals for the next 2-3 weeks.  5.  Outpatient followup in the next 4 weeks, earlier if need be.      Anselmo Rod, [name]  Electronically Signed     JNM/MEDQ  D:  02/01/2005  T:  02/01/2005  Job:  [id number]   cc:   Juline Patch, [name]  Fax: [phone]

## 2010-09-11 NOTE — Op Note (Signed)
[place]. [facility]  Patient:    [name], [name]                       MRN: [id number] Proc. Date: 08/15/00 Adm. Date:  [id number] Attending:  Barton Fanny                           Operative Report  PREOPERATIVE DIAGNOSIS:  Left L3-4 foraminal, extraforaminal disk herniation.  POSTOPERATIVE DIAGNOSIS:  Left L3-4 foraminal, extraforaminal disk herniation.  PROCEDURE:  Left L3-4 extraforaminal microdiskectomy.  SURGEON:  Hewitt Shorts, [name]  ASSISTANT:  Lovell Sheehan.  ANESTHESIA:  General endotracheal anesthesia.  INDICATIONS:  This is a 60 year old woman who presented with an acute left lumbar radiculopathy which was found to be secondary to a left L3-4 extraforaminal disk herniation.  A decision was made to proceed with the left L3-4 extraforaminal microdiskectomy.  PROCEDURE:  The patient was brought to the operating room and placed under general endotracheal anesthesia.  The patient was turned to a prone position and the lumbar region was prepped with Betadine soap and solution and draped in a sterile fashion.  Local x-rays were taken and the L3-4 level identified. The midline was infiltrated with local anesthetic with epinephrine and a midline incision was made and carried down to the subcutaneous tissue. Bipolar cautery and electrocautery was used to maintain hemostasis. Dissection was carried to the left side of the midline and the fascia was incised and the paraspinal muscles dissected from the spinous process and lamina in a subperiosteal fashion.  The L3-4 intralaminar space was identified using x-ray and then dissection was carried laterally over the facet joints. The transverse processes of L4 was identified and then a lateral facetectomy was performed using the [employer].  Dissection was carried down to the superolateral aspect of the pedicle and the superior aspect of the transverse process.  The microscope was draped  and brought into the field to provide additional magnification illumination and visualization and the remainder of the procedure was performed using microsurgical microdissection technique.  The left L3 nerve root was identified and then the left L3-4 disk was identified. There was a subligamentous disk herniation.  The annulus was incised and we proceeded with a thorough diskectomy using a variety of pituitary rongeurs. The left L3-4 nerve root was thoroughly decompressed and all lose fragments of disk material were removed from both disk spaces and the extraforaminal space.  In the end after hemostasis was established and diskectomy completed we instilled 2 cc of penicillin and 80 mg of Depo-Medrol into the extraforaminal space and then proceeded with closure.  The deeper fascia was closed with interrupted undyed 0 Vicryl sutures and the subcutaneous and subcuticular were closed with interrupted and inverted 2-0 interrupted Vicryl sutures and the skin was reapproximated with dermabond.  The patient tolerated the procedure well.  The estimated blood loss was 25 cc.  Sponge and needle count were correct.  Following the surgery the patient was turned back to a supine position to reversed from the anesthetic, extubated and transferred to the recovery room for further care. DD:  08/15/00 TD:  08/15/00 Job: [id number]

## 2010-09-11 NOTE — Cardiovascular Report (Signed)
   [name], [name]                          ACCOUNT NO.:  [id number]   MEDICAL RECORD NO.:  [id number]                   PATIENT TYPE:  OBV   LOCATION:  2002                                 FACILITY:  [facility]   PHYSICIAN:  [name], [name]             DATE OF BIRTH:  [birth year]   DATE OF PROCEDURE:  11/20/2002  DATE OF DISCHARGE:                              CARDIAC CATHETERIZATION   PROCEDURES PERFORMED:  1. Selective coronary angiography by Judkins technique.  2. Retrograde left heart catheterization.  3. Left ventricular angiography.  4. Abdominal aortography.   ENTRY SITE:  Right femoral.   DYE USED:  Omnipaque.   MEDICATIONS GIVEN:  Fentanyl for sedation 25 mg IV and Zofran 4 mg IV for  nausea.  The patient showed no evidence of wheezes, rash, or angioedema  following Omnipaque.  She was premedicated prior to catheterization with  Benadryl, Pepcid, oral prednisone, and IV Solu-Medrol.   RESULTS:  PRESSURES:  The central aortic pressure was 130/70, mean of 95.  Left ventricular pressure was 130/0, end-diastolic pressure 9.  No aortic  valve gradient by pullback technique.   ANGIOGRAPHIC RESULTS:  The coronary arteries were entirely normal.  The  right coronary artery was codominant with the LAD which wrapped around the  cardiac apex.  No lesions were seen in LAD or diagonal branch #1 nor in  circumflex or two obtuse marginal branches.  The right coronary artery  showed no lesions in the main right coronary artery or its posterolateral  and posterior descending branches.   Left ventricular ejection fraction was 60% and normal LV wall motion was  observed.   The abdominal aorta was smooth.  Both common iliacs are normal.  The renal  arteries are normal.   FINAL DIAGNOSES:  1. Angiographically patent coronary arteries with a balanced right and left     coronary system in terms of dominance.  2. Normal left ventricular systolic function.  3. Normal renal  arteries and abdominal aorta.    PLAN:  The patient will recuperate for four hours.  I talked to her  attending during this hospitalization, Elliot Cousin, [name] who indicates the  possibility of a discharge in four hours.  She should follow up with Juline Patch, [name] and should see [place] again at Juline Patch, [name] request.                                               [name], [name]    WHG/MEDQ  D:  11/20/2002  T:  11/21/2002  Job:  [id number]   cc:   Juline Patch, [name]  [address]  [place], [place] [address]  Fax: [phone]   Elliot Cousin, [name]   Cath Lab

## 2010-09-11 NOTE — H&P (Signed)
. [facility]  Patient:    [name], [name]                   MRN: [id number] Adm. Date:  [id number] Attending:  Darnelle Bos CC:         Winn Jock. Earl Gala, [name]   History and Physical  CHIEF COMPLAINT: Temporary loss of vision in right eye with slight right facial numbness.  HISTORY OF PRESENT ILLNESS: [name] is a pleasant 60 year old female with a history of hypertension for almost 30 years.  She is a current smoker and has been smoking for years as well.  She present after an episode this morning of sudden loss of vision in her right eye.  She states it appeared "as though a window was closing over the eye".  This persisted for several minutes.  There was question of whether or not her left eye vision was effected as well, however.  She denies palpitations or shortness of breath.  She has had some left anterior pleuritic chest pain for a few days.  The patient was driving when this visual change occurred.  She has felt some pressure behind her right eye since and has a vague mild numbness to her right face relative to the left she perceives.  She denies any dysarthria, ataxia, or peripheral symptoms in her extremities.  ALLERGIES:  1. IVP DYE.  2. TUSSIONEX.  3. ERYTHROMYCIN.  4. She apparently cannot tolerate WELLBUTRIN in that it causes anxiety.  PAST MEDICAL HISTORY:  1. Hypertension, since approximately the age of 44.  She has used blood     pressure medications intermittently.  She has taken her Dyazide only about     two days out of the last ten, complaining that it causes foot cramps.  2. Chronic tobacco use.  3. Surgical menopause, on hormone replacement therapy.  4. Diverticulosis.  5. History of pleurisy in the past, which seems to be worse with elevated     blood pressures.  6. Herniated lumbar disk in the past.  7. History of depression in the past.  8. Allergic rhinitis.  9. Episodic sinusitis. 10. Questionable  history of asthma in the past.  PAST SURGICAL HISTORY:  1. Lumbar disk.  2. Tubal ligation.  3. Total abdominal hysterectomy and bilateral salpingo-oophorectomy.  4. Status post dilatation and curettage.  5. Status post nasal surgery and sinus surgery.  MEDICATIONS:  1. Dyazide, takes occasionally.  2. Claritin 10 mg q.d. on p.r.n. basis; tends to take it most of the time.  3. Tylenol P.M. 1 every night to every other night.  4. Vioxx 25 mg q.d. for the last three months.  5. Estrogen patch placed weekly.  FAMILY HISTORY: Positive for early coronary artery disease and cerebrovascular disease.  SOCIAL HISTORY: The patient is married.  She has used tobacco for many years.  REVIEW OF SYSTEMS: No exertional chest pain.  She does state that she has a high HDL cholesterol.  PHYSICAL EXAMINATION:  GENERAL: The patient is alert and oriented, and in no acute distress.  VITAL SIGNS: Blood pressure 182/109 on admission to the emergency room and 170/105 after p.o. Norvasc and clonidine patch placed.  Pulse 76 and regular. Respiratory rate 20 and easy.  Temperature 98.1 degrees.  Oxygen saturation 98% on room air.  HEENT: Vision in the right eye is 20/100 and in the left eye 20/70; both eyes 20/50.  She states she uses glasses and she has astigmatism in  the right eye, and does not think her vision is any less in the right eye than usual currently.  EOMI.  PERRL bilaterally.  Fundi not edematous bilaterally. Oropharynx clear.  NECK: Supple, without JVD.  No bruits.  Carotids 2+ bilaterally.  CHEST: Coarse breath sounds bilaterally.  No rales or wheezes.  CARDIAC: Regular rate and rhythm with positive S4.  ABDOMEN; Soft, nontender.  EXTREMITIES: Without clubbing, cyanosis, or edema.  NEUROLOGIC: Cranial nerves intact except for questionable mild numbness to light touch over the right forehead and right face; otherwise, nonfocal examination.  Both eyes with intact visual  fields.  SKIN: No rashes.  LABORATORY DATA: Chest x-ray shows no active disease.  EKG shows sinus rhythm at 60, nonspecific ST-T wave changes; no evidence of LVH.  Head CT shows ethmoid sinusitis present, otherwise normal.  WBC 4100, hemoglobin 16.4, platelet count 166,000.  Sodium 136, potassium 3.6, chloride 104, bicarbonate 25, BUN 12, creatinine 0.6.  Blood sugar 89.  Pro time 11.2, PTT 27.  LFTs normal.  CPK 43.  ASSESSMENT: The patient is a 60 year old female status post surgical menopause, on hormone replacement therapy, with a long history of hypertension, tobacco use (symptoms suggest amaurosis fugax on the right). Need to rule out cerebrovascular disease and control blood pressure.  No left ventricular hypertrophy on electrocardiogram noted.  Wonder if she may have had arterial spasm and a migraine equivalent.  PLAN:  1. Continue Norvasc 5 mg q.d.  2. Clonidine patch has been placed.  3. Hold Dyazide because of complaints of cramping in feet.  4. Add ARB or ACE inhibitor in a.m. if blood pressure still elevated.  May     use IV Hydralazine 10 mg q.3h to q.4h for blood pressure systolic greater     than 190.  Will try to avoid beta-blockers if possible because of history     of asthmatic symptoms in the past.  5. Start aspirin 325 mg q.d. and Plavix 75 mg q.d.  6. Check carotid Dopplers.  7. Consider MRI/MRA of head.  8. Consider 2D echocardiogram of heart.  9. Continue estrogen for now. 10. Nicotine patch topically. 11. Xanax 0.25 mg p.o. q.4h to q.6h p.r.n. anxiety secondary to nicotine     withdrawal. 12. Ethmoid sinus disease wear.  Consider nasal steroid. DD:  02/28/00 TD:  02/29/00 Job: [id number]

## 2010-09-11 NOTE — Op Note (Signed)
   NAMELAKINA, [name]                          ACCOUNT NO.:  [id number]   MEDICAL RECORD NO.:  [id number]                   PATIENT TYPE:  AMB   LOCATION:  ENDO                                 FACILITY:  [facility]   PHYSICIAN:  Anselmo Rod, [name]               DATE OF BIRTH:  July 10, 1950   DATE OF PROCEDURE:  09/07/2002  DATE OF DISCHARGE:                                 OPERATIVE REPORT   PROCEDURE:  Colonoscopy.   ENDOSCOPIST:  Charna Elizabeth, [name]   INSTRUMENT USED:  Olympus video colonoscope.   INDICATIONS FOR PROCEDURE:  This is a 60 year old female with a past history  of colitis undergoing a screening colonoscopy for history of rectal bleeding  and abdominal pain.  Rule out colonic polyps, masses, etc.  Question of IBD.   PROCEDURE PERFORMED:  Informed consent was procured from the patient.  The  patient fasted for eight hours prior to the procedure and prepped with a  bottle of magnesium citrate and a gallon of GOLYTELY the night prior to the  procedure.   PREPROCEDURE PHYSICAL EXAMINATION:  VITAL SIGNS:  The patient had stable  vital signs.  NECK:  Supple.  CHEST:  Clear to auscultation.  HEART:  S1 and S2 regular.  ABDOMEN:  Soft with normal bowel sounds.   DESCRIPTION OF PROCEDURE:  The patient was placed in the left lateral  decubitus position, sedated with 75 mg of Demerol and 7.5 mg of Versed  intravenously.  Once the patient was adequately sedated and maintained on  low flow oxygen, continuous cardiac monitoring, the Olympus video  colonoscope was advanced from the rectum to the cecum and terminal ileum  without difficulty.  The entire colonic mucosa appeared healthy with a  normal vascular pattern.  No masses, polyps, erosions, ulcerations or  diverticula were seen.  Small internal hemorrhoids were seen on  retroflexion.  No erosions or ulcerations were identified.  There was no  evidence of diverticulosis.   IMPRESSION:  Normal colonoscopy to the terminal  ileum except for small  internal hemorrhoids.   RECOMMENDATIONS:  1. An enteroscopy will be planned for the patient at the earliest.  2. A CBC was checked today.  3. Further recommendation at followup.                                              Anselmo Rod, [name]   JNM/MEDQ  D:  09/07/2002  T:  09/07/2002  Job:  [id number]   cc:   Hilliard Clark, [name]

## 2010-09-11 NOTE — H&P (Signed)
Melanie, Reyes                          ACCOUNT NO.:  [id number]   MEDICAL RECORD NO.:  [id number]                   PATIENT TYPE:  OBV   LOCATION:  1829                                 FACILITY:  [facility]   PHYSICIAN:  Elliot Cousin, [name]                 DATE OF BIRTH:  1950/06/22   DATE OF ADMISSION:  11/19/2002  DATE OF DISCHARGE:                                HISTORY & PHYSICAL   CHIEF COMPLAINT:  Chest pain.   HISTORY OF PRESENT ILLNESS:  [name] is a 60 year old woman with a past  medical history significant for hypertension and gastrointestinal reflux  disease, who presented to the emergency department today with chest pain.  The patient was actually seen in her doctor's office. [name]. Renne Crigler, [name],  evaluated the patient today, however, the patient's primary care physician  is Dr. Ricki Miller.  She was evaluated in her doctor's office today secondary to  chest pain.  She states that she woke up this morning at approximately 6:30  a.m. with chest pain.  However, the chest pain did not awaken her.  She  describes the pain as a pressure.  It is located substernally and it  radiates to the right chest, then back to the left chest, and then over to  the right shoulder occasionally.  She has no associated pleurisy,  diaphoresis, shortness of breath, nausea, or lightheadedness this morning.  The pain was initially rated as a 6-7/10, however, the pain subsided  substantially to a 1/10 during the two to three hours following.  The  patient actually went to work today, however, she decided to see her  physician because the chest pain returned after it subsided.  She actually  had an episode of chest pain last week that woke her up out of her sleep.  Again it was an a.m. chest pain.  At that time, the chest pain lasted about  30-45 minutes and it was associated with lightheadedness, diaphoresis, and  nausea.  She had no further chest pain until today.  The patient denies any  heavy  lifting or chest wall pain.  However, she does have a history of  reflux disease and a history of two left rotator cuff surgeries.  The  patient currently rates her pain as a 1/10.  Apparently she was not given  sublingual nitroglycerin in the emergency department.   PAST MEDICAL HISTORY:  1. Hypertension.  2. Status post laparoscopic cholecystectomy in June of 2004 per Dr.     Luan Pulling.  3. Small internal hemorrhoids per colonoscopy on Sep 07, 2002, per Dr. Loreta Ave.  4. EGD in October of 2003 was unremarkable per Dr. Virginia Rochester.  5. Occasional anxiety.  6. Tobacco use.  7. Gastrointestinal reflux disease.  8. Status post total abdominal hysterectomy secondary to dysfunctional     uterine bleeding in 2000.  9. Status post history of two left rotator  cuff surgeries in the past.  10.      Status post history of two lumbar back surgeries in the past.  11.      Status post sinus surgery in the past.  12.      Status post removal of a benign tumor from her throat in 1997.  13.      Status post bilateral tubal ligation in the past.   MEDICATIONS:  1. Clonidine 0.1 mg half of a tablet daily.  2. Hydrochlorothiazide/triamterene 25/37.5 mg daily.  3. Diltiazem ER 240 mg daily.  4. Atenolol 100 mg half of a tablet daily.  5. Ambien 5-10 mg q.h.s. p.r.n.  6. Xanax 0.25 mg t.i.d. p.r.n.  7. Protonix 40 mg b.i.d.  8. Aspirin 81 mg daily.   ALLERGIES:  The patient has allergies to MORPHINE and CONTRAST DYE.   FAMILY HISTORY:  The patient's mother died at 65 years of age of lung and  liver cancer.  The death of her father is unknown.  Her maternal grandfather  had his first myocardial infarction at 59 years of age and subsequently died  of a myocardial infarction at 59 years of age.  She has a brother who is 7  years of age and has hypertension and hepatitis C.   SOCIAL HISTORY:  The patient is separated.  She has two children.  [place]  She is employed full-time.   She now smokes  four cigarettes per day, however, this has been a recent reduction from a  pack per day after nearly 30 years of smoking.  She does drink alcohol,  approximately two beers and one glass of wine three to four days a week.  She denies any illegal drug use.   REVIEW OF SYSTEMS:  As above in the history of present illness.  In  addition, the review of systems is negative for fever, chills, upper  respiratory symptoms, cough, pleurisy, shortness of breath, and swelling in  her legs.  Her review of systems is positive for loose stools, approximately  three to four per day over the past few days.   PHYSICAL EXAMINATION:  VITAL SIGNS:  Temperature 98.6 degrees and blood  pressure 113/85.  The respiratory rate initially was 120, but now is 50.  The oxygen saturation is 100% on room air.  GENERAL APPEARANCE:  The patient is a middle-aged Caucasian woman who is  currently lying in bed in no acute distress.  HEENT:  The head is normocephalic and atraumatic.  Pupils are equal, round,  and reactive to light.  Extraocular movements are intact.  The conjunctivae  are clear.  The sclerae are white.  The oropharynx reveals good dentition.  Mucous membranes are moist.  No posterior exudates or erythema.  NECK:  Supple without any JVD and without any thyromegaly.  LUNGS:  Clear to auscultation bilaterally.  HEART:  S1 and S2 with no murmurs, rubs, or gallops.  ABDOMEN:  There is a well-healed epigastric scar.  Bowel sounds are present.  The abdomen is soft, nontender, and nondistended.  No hepatosplenomegaly.  EXTREMITIES:  Pedal pulses are barely palpable bilaterally.  Her feet are  warm to touch.  No pretibial edema.  No pedal edema.  NEUROLOGIC:  The patient is alert and oriented x 3.  Cranial nerves II-XII  are intact.  Strength is 5/5 throughout.  Sensation is intact to soft touch.   ADMISSION LABORATORY DATA:  Chest x-ray with no acute disease.  EKG with sinus bradycardia and  nonspecific T-wave abnormalities.  The heart rate is  51 beats per minute.  CK-MB 1.6, less than 1.0, and less than 1.0.  Myoglobin 61, 48.5, and 47.9.  Troponin I less than 0.5 x 3 readings.  WBC  5.5, hemoglobin 14.4, hematocrit 41.9, MCV 96.5, platelets 192.  Sodium 138,  potassium 3.2, chloride 100, CO2 33, glucose 88, BUN 14, creatinine 0.6,  calcium 9.1, total protein 6.0, albumin 3.8, AST 32, ALT 30, alkaline  phosphatase 62, total bilirubin 0.9.   ASSESSMENT:  1. Chest pain.  The etiology of the chest pain is uncertain.  However, the     patient will be admitted for cardiac evaluation and to rule out a     myocardial infarction.  The patient does have risk factors, namely     hypertension, tobacco abuse, and family history.  Other considerations     for chest pain include a GI source given that the patient is status post     laparoscopic cholecystectomy in June of 2004 and the patient has a     chronic history of gastrointestinal reflux disease.  Pulmonary etiology,     namely pulmonary embolism, is less likely given that the patient does not     have pleurisy and she is oxygenating 100% on room air.  2. Bradycardia.  The patient's heart rate is ranging between 50-55 during     the emergency department stay.  The bradycardia is most likely secondary     to the combination of clonidine, diltiazem, and atenolol.  The patient     does have some nonspecific T-wave abnormalities on exam.  It is important     to note that the patient had an EKG done in the doctor's office today     which revealed sinus bradycardia with a heart rate of 48 and nonspecific     anterior T-wave abnormalities.  She also recently had a stress test     approximately a week ago, but the results are unknown at this time.  3. Hypokalemia.  This is probably secondary to the patient's HCTZ.  4. Tobacco abuse.  The patient is currently trying to quit.   PLAN:  1. The patient will be admitted for observation to a  telemetry bed.  Cardiac     enzymes, CK, CK-MB, and troponin I will be collected over the next 24     hours.  2. The patient will be continued on aspirin each day.  3. Cardiology will be consulted for further evaluation and management.  4. She will be treated with nitroglycerin sublingual p.r.n. for chest pain,     as well as Percocet.  If the nitroglycerin and Percocet do not relieve     her pain, then will try Demerol IV as needed.  5. Will continue antihypertensives, however, will hold diltiazem for a heart     rate less than 55.  6. Continue PPI with the Protonix 40 mg b.i.d.  7. Will replete potassium chloride p.o. and IV if needed.  Will hold HCTZ     and the patient gentle volume repletion with normal saline with potassium     chloride added at 50 mL/hr.                                                Angelique Blonder  Sherrie Mustache, [name]    DF/MEDQ  D:  11/19/2002  T:  11/19/2002  Job:  [id number]   cc:   Juline Patch, [name]  [address] [place], [place] [address]  Fax: [phone]

## 2010-09-11 NOTE — Discharge Summary (Signed)
[name], [name]                          ACCOUNT NO.:  [id number]   MEDICAL RECORD NO.:  [id number]                   PATIENT TYPE:  OBV   LOCATION:  2002                                 FACILITY:  [facility]   PHYSICIAN:  Elliot Cousin, [name]                 DATE OF BIRTH:  1950-11-19   DATE OF ADMISSION:  11/19/2002  DATE OF DISCHARGE:  11/21/2002                                 DISCHARGE SUMMARY   DISCHARGE DIAGNOSES:  1. Substernal chest pain.  The patient ruled out for an myocardial     infarction.     a. Cardiac catheterization was completely normal per [name],        [name]  2. Bradycardia thought to be secondary to a combination of clonidine,     atenolol, and Cardizem.  3. Hypokalemia thought to be secondary to hydrochlorothiazide.  4. Hypertension.  5. Tobacco abuse.  6. Occasional anxiety.  7. Gastroesophageal reflux disease.  8. 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  9. EGD in October of 2003 was unremarkable per Georgiana Spinner, [name]  10.      Status post total abdominal hysterectomy secondary to dysfunctional     uterine bleeding in 2000.  11.      Status post history of two left rotator cuff surgeries in the past.  12.      Status post history of two lumbar back surgeries in the past.  13.      Status post sinus surgery in the past.  14.      Status post removal of a benign tumor from her throat in 1997.  15.      Status post bilateral tubal ligation in the past.   DISCHARGE MEDICATIONS:  1. HOLD CLONIDINE AND DILTIAZEM UNTIL REEVALUATED BY YOUR PRIMARY CARE     PHYSICIAN.  2. Atenolol 100 mg one-fourth of a tablet daily.  3. HCTZ/Triamterene 25/37.5 mg daily.  4. Aspirin 81 mg daily.  5. Xanax 0.25 mg t.i.d. p.r.n.  6. Ambien 5 mg q.h.s. p.r.n.  7. Protonix 40 mg b.i.d.  8. Extra strength Tylenol as needed for pain.  9. Lipitor 10 mg half tablet at bedtime.   DISCHARGE DISPOSITION:  The patient was  discharged to home on November 21, 2002  in improved and stable condition.  She was asked to call her primary care  physician's office, Juline Patch, [name] for an appointment to be followed up  in the next three to five days.   CONSULTATIONS:  [name], [name], cardiologist.   PROCEDURES PERFORMED:  Cardiac catheterization on November 17, 2002 per [name], [name]  The results revealed normal coronary arteries, left  ventricular function normal, normal renal arteries.  Ejection fraction  approximately 60%.   HISTORY OF PRESENT ILLNESS:  The patient is a 60 year old woman  with a past  medical history significant for hypertension and gastroesophageal reflux  disease who presented to the emergency department on November 19, 2002 with  chest pain.  The patient was evaluated in the office by Soyla Murphy. Renne Crigler,  [name]  When she complained of chest pain Soyla Murphy. Renne Crigler, [name] recommended  that she be evaluated in the emergency department.  The patient states that  the chest pain occurred the morning of admission.  She described the pain as  pressure.  It was located substernally and it radiated to the right chest,  then back to the left chest, then over to the right shoulder occasionally.  There was no associated pleurisy, diaphoresis, shortness of breath, nausea,  or lightheadedness.  Given that the patient had risk factors for cardiac  disease, namely hypertension, tobacco abuse, and positive family history,  the patient was admitted for evaluation of chest pain.   HOSPITAL COURSE:  Problem 1 - CHEST PAIN:  The patient on arrival to the  emergency department had minimal chest pain.  In fact, she rated the chest  pain as 1/10 while she was in the emergency department.  However, during the  earlier part of the day her pain was rated as a 6-7/10.  The patient was not  treated with any heparin or nitroglycerin while she was down in the  emergency department.  The management started when the  patient was admitted  to a telemetry bed.  Cardiac enzymes were obtained q.8h. x3.  The patient  was continued on her baby aspirin each day.  The patient was ordered  sublingual nitroglycerin p.r.n. for chest pain as well as Percocet as needed  for chest pain.  However, the patient did not need either.  She was  continued on her proton pump inhibitor, Protonix 40 mg b.i.d.  She was given  mild/gentle volume repletion with normal saline at 50 mL/hour.  A cardiology  consult was obtained with [facility].   The patient's EKG initially and subsequently revealed sinus bradycardia with  nonspecific T-wave abnormalities.  Initially, her heart rate ranged between  40-55 during the emergency department stay.  The cardiac enzymes were  negative x3.  A cardiac catheterization was provided by [name],  [name] which was performed on November 20, 2002.  The cardiac catheterization was  within normal limits with the patient having normal coronary arteries and  normal left ventricular function with an ejection fraction of 60%.  The  patient subsequently was pain-free during the entire hospital course.  The  etiology of the patient's chest pain is unknown.  Given her history of  gastroesophageal reflux disease, it may have been transient exacerbation.  The patient was admonished to stop smoking and she was advised to continue  her baby aspirin each day.   Problem 2 - BRADYCARDIA:  The patient's initial heart rate ranged between 50-  55 during the first 12 hours of hospitalization.  However, it did drop down  into the mid 40s during the night.  The bradycardia was thought to be  secondary to combination of her antihypertensives Diltiazem, clonidine, and  atenolol.  These medications were withheld during the hospital course.  The  atenolol was restarted at 100 mg.  However, it was only a fourth of a tablet each day.  The clonidine and the Cardizem will be held until the patient is   reevaluated by her primary care physician at the hospital follow-up  appointment.  The patient's heart rate prior  to hospital discharge ranged  between 55-60.   Problem 3 - HYPERTENSION:  The patient's blood pressures were well within  normal limits during the hospitalization.  Her systolic blood pressure  ranged between 110 and 135 despite the antihypertensives being held.  As  above, the patient was advised to not take the clonidine and Cardizem until  reevaluated by her primary care physician.  She was advised to take a fourth  of a tablet of atenolol each day.   Problem 4 - The patient's lipid panel was assessed and was found to be  within normal limits relatively speaking with a total cholesterol of 188, a  total triglyceride of 114, LDL cholesterol of 104, and an HDL cholesterol of  61.  The decision was made to start the patient on Lipitor at 10 mg half  tablet at bedtime given her history of possible inferior mesenteric artery  occlusion seen on the MRI in June of 2004.  She will need follow-up  assessment of her liver function tests and cholesterol in several weeks to  several months.   Problem 5 - HYPOKALEMIA:  The patient's potassium was mildly depressed at  3.2.  She received supplementation p.o. and IV during the hospital course.  Her potassium was 3.7 prior to hospital discharge.  The hypokalemia was  thought to be secondary to the hydrochlorothiazide.  The patient may need  continued potassium supplementation as an outpatient.   Problem 6 - The patient's thyroid function tests were assessed during  hospitalization; however, they were pending at hospital discharge.                                                Elliot Cousin, [name]    DF/MEDQ  D:  11/21/2002  T:  11/22/2002  Job:  [id number]   cc:   Juline Patch, [name]  [address]  [place], [place] [address]  Fax: [phone]

## 2010-09-11 NOTE — Op Note (Signed)
   [name], [name]                          ACCOUNT NO.:  [id number]   MEDICAL RECORD NO.:  [id number]                   PATIENT TYPE:  AMB   LOCATION:  ENDO                                 FACILITY:  [facility]   PHYSICIAN:  Georgiana Spinner, [name]                 DATE OF BIRTH:  1950-07-06   DATE OF PROCEDURE:  DATE OF DISCHARGE:                                 OPERATIVE REPORT   PROCEDURE:  Upper endoscopy.   ENDOSCOPIST:  Georgiana Spinner, [name]   INDICATIONS FOR PROCEDURE:  GERD.   ANESTHESIA:  Demerol 70 and Versed 7 mg .   DESCRIPTION OF PROCEDURE:  With the patient mildly sedated and in the left  lateral decubitus position the Olympus videoscopic endoscope was inserted  into the mouth, passed under direct vision through the esophagus, which  appeared normal.  There was no evidence of Barrett's seen.   We entered into the stomach.  Fundus, body, antrum, duodenal bulb, and  second portion of the duodenum appeared normal and were photographed.  From  this point the endoscope was slowly withdrawn, taking several different  views of the duodenal mucosa. _________ the endoscope and pulled back  into  stomach and placed in retroflexed, and viewed the stomach from below.  This  too was photographed.  The endoscope was then straightened and withdrawn  taking circumferential views in the midepigastric and esophageal mucosa.   The patient's vital signs and pulse oximeter remained stable.  The patient  tolerated the procedure well without apparent complications.   FINDINGS:  This was an unremarkable endoscopic examination.   PLAN:  Add Carafate to patient's present regimen and have patient follow up  with me as an outpatient.                                               Georgiana Spinner, [name]    GMO/MEDQ  D:  01/30/2002  T:  01/30/2002  Job:  [id number]

## 2010-09-11 NOTE — H&P (Signed)
[place]  Patient:    [name], [name]                       MRN: [id number] Adm. Date:  [id number] Attending:  Barton Fanny                         History and Physical  HISTORY OF PRESENT ILLNESS:  A 60 year old right-handed white female who has been a patient of mine for a number of years who presents for evaluation of left lumbar radiculopathy.  We had previously last seen her in October of 2001 and referred her for physical therapy for low back discomfort.   She did not find this therapy helped much, and she continued to have diffuse low back discomfort.  However, about four and one-half months ago, she developed pain that was shooting into the anterior proximal left thigh.  It does not radiate through the entire thigh but is located slightly below the level of the left groin.  She has continued to have low back pain but does not describe any weakness, numbness, or paresthesias.  She was treated with two courses of prednisone 6 to 8 dosepak which tended to help some, but the pain would recur. She has been treated with a number of medications including Neurontin, Tegretol, and Elavil by a number of different physicians without relief.  The patient was studied with MRI of the lumbar spine which shows post surgical changes on the right side of the L4-5 level.  There is degenerative disk disease and spondylosis throughout the lumbar spine, but there is a left L3-4 foraminal and extraforaminal disk herniation which compresses the left L3 nerve roots.  The patient has found the radicular pain increasingly incapacitating and is now admitted for surgery.  PAST MEDICAL HISTORY:  Notable for a history of hypertension as well as a history of previous lumbar surgery.  ALLERGIES:  She reports allergies to IV CONTRAST as well as the fact that MORPHINE and CODEINE cause nausea.  She also reports reaction to AMITRIPTYLINE.  FAMILY HISTORY:   Noncontributory.  SOCIAL HISTORY:  The patient is married.  She smokes about a pack and one-half a day and has been smoking for 34 years.  She has two to three alcoholic drinks per week.  She works in the [employer].  REVIEW OF SYSTEMS:  Notable as described in her History of Present Illness and Past Medical History and is otherwise unremarkable.  PHYSICAL EXAMINATION:  GENERAL:  The patient is a well-developed, well-nourished white female in no acute distress.  LUNGS:  Clear to auscultation.  She has symmetric respiratory excursion.  HEART:  Regular rate and rhythm, normal S1 and S2.  No murmur.  ABDOMEN:  Soft, nondistended, nontender.  Bowel sounds are present.  EXTREMITIES:  No clubbing, cyanosis, or edema.  MUSCULOSKELETAL:  On examination of the patients lumbar spinous process and lumbar musculature, she has flexion of 90 degrees.  She has some discomfort with that.  She is able to extend but has more significant discomfort on extension.  Straight leg raise is negative bilaterally.  NEUROLOGIC:  Good random testing of the iliopsoas bilaterally but somewhat more readily on the left side suggesting mild left iliopsoas weakness.  The quadriceps, dorsiflexors, and plantar flexion are 5 bilaterally.  The left extensor hallucis longus is 4/5, the right is 5/5.  Sensation is intact to pinprick.  Lower extremity  reflexes are 1 to 2 at quadriceps, minimal in the gastrocnemius, and symmetric bilaterally.  Toes are downgoing bilaterally. She has normal gait and stance.  IMPRESSION:  Left L3 radiculopathy secondary to left L3-4 foraminal and extraforaminal disk herniation.  PLAN:  The patient will be admitted for a left L3-4 extraforaminal microdiskectomy.  We discussed the alternatives to the surgery, the nature of the surgical procedure itself, typical length of surgery, hospital stay, and overall recuperation, limitations during the postoperative period, and  risks of surgery including risks of infection, bleeding, possibility of transfusion, the risk of nerve dysfunction, pain, weakness, numbness, paresthesias, risk of anesthetic complications particularly in light of her significant hypertensive and smoking histories, as well as risk of myocardial infarction, stroke, pneumonia, and death.  Understanding all of this, she does wish to proceed with surgery and is admitted for such. DD:  08/15/00 TD:  08/15/00 Job: [id number]

## 2010-10-05 ENCOUNTER — Encounter: Payer: 59 | Admitting: Obstetrics and Gynecology

## 2010-11-21 ENCOUNTER — Other Ambulatory Visit: Payer: Self-pay | Admitting: Obstetrics and Gynecology

## 2010-11-23 ENCOUNTER — Other Ambulatory Visit: Payer: Self-pay | Admitting: *Deleted

## 2010-11-23 MED ORDER — ERGOCALCIFEROL 1.25 MG (50000 UT) PO CAPS: 50000.0000 [IU] | ORAL_CAPSULE | ORAL | Status: DC

## 2010-12-24 ENCOUNTER — Other Ambulatory Visit: Payer: Self-pay | Admitting: Obstetrics and Gynecology

## 2011-01-29 ENCOUNTER — Other Ambulatory Visit: Payer: Self-pay | Admitting: Obstetrics and Gynecology

## 2011-03-04 ENCOUNTER — Other Ambulatory Visit: Payer: Self-pay | Admitting: Obstetrics and Gynecology

## 2011-04-08 ENCOUNTER — Other Ambulatory Visit: Payer: Self-pay | Admitting: *Deleted

## 2011-04-08 DIAGNOSIS — R921 Mammographic calcification found on diagnostic imaging of breast: Secondary | ICD-10-CM

## 2011-04-09 ENCOUNTER — Other Ambulatory Visit: Payer: Self-pay | Admitting: Obstetrics and Gynecology

## 2011-04-09 DIAGNOSIS — R921 Mammographic calcification found on diagnostic imaging of breast: Secondary | ICD-10-CM

## 2011-05-14 ENCOUNTER — Ambulatory Visit: Payer: Self-pay | Admitting: Family Medicine

## 2011-05-28 ENCOUNTER — Encounter: Payer: Self-pay | Admitting: Family Medicine

## 2011-05-28 NOTE — Progress Notes (Signed)
This encounter was created in error - please disregard.

## 2011-09-22 ENCOUNTER — Encounter: Payer: 59 | Admitting: Obstetrics and Gynecology

## 2011-11-08 ENCOUNTER — Encounter: Payer: Self-pay | Admitting: Women's Health

## 2011-11-08 ENCOUNTER — Ambulatory Visit (INDEPENDENT_AMBULATORY_CARE_PROVIDER_SITE_OTHER): Payer: 59 | Admitting: Women's Health

## 2011-11-08 DIAGNOSIS — R300 Dysuria: Secondary | ICD-10-CM

## 2011-11-08 DIAGNOSIS — N898 Other specified noninflammatory disorders of vagina: Secondary | ICD-10-CM

## 2011-11-08 DIAGNOSIS — L293 Anogenital pruritus, unspecified: Secondary | ICD-10-CM

## 2011-11-08 LAB — URINALYSIS W MICROSCOPIC + REFLEX CULTURE
Bilirubin Urine: NEGATIVE
Casts: NONE SEEN
Crystals: NONE SEEN
Glucose, UA: NEGATIVE mg/dL
Protein, ur: NEGATIVE mg/dL
pH: 5.5 (ref 5.0–8.0)

## 2011-11-08 LAB — WET PREP FOR TRICH, YEAST, CLUE: Trich, Wet Prep: NONE SEEN

## 2011-11-08 MED ORDER — NITROFURANTOIN MONOHYD MACRO 100 MG PO CAPS: 100.0000 mg | ORAL_CAPSULE | Freq: Two times a day (BID) | ORAL | Status: AC

## 2011-11-08 MED ORDER — TERCONAZOLE 0.4 % VA CREA: 1.0000 | TOPICAL_CREAM | Freq: Every day | VAGINAL | Status: AC

## 2011-11-08 NOTE — Progress Notes (Signed)
Patient ID: [name], female   DOB: 02-08-1951, 61 y.o.   MRN: [id number] Presents with vaginal itching X a few weeks and dysuria/urinary frequency X 2 days. Denies fever, chills, or flank pain. TVH with BSO/no HRT.   Exam:External vaginal introitus erythematous,  Wet prep done with Q-tip: positive for yeast. U/A: 7-10 WBC's, many bacteria, nitrite positive, trace RBCs. Urine culture pending.  Yeast Vaginitis UTI  Plan: Terazole 1 applicator at bedtime X 7 nights, prescription and use reviewed. Macrobid 100 mg bid X 7 days, instructed to take with food, prescription and use reviewed. Reviewed yeast and UTI prevention. Instructed to increase fluid intake. Instructed to call if no improvement of symptoms.

## 2011-11-08 NOTE — Patient Instructions (Addendum)

## 2011-11-11 LAB — URINE CULTURE: Colony Count: >=100000

## 2011-11-29 ENCOUNTER — Telehealth: Payer: Self-pay | Admitting: *Deleted

## 2011-11-29 MED ORDER — MAGIC MOUTHWASH W/LIDOCAINE: ORAL | Status: DC

## 2011-11-29 NOTE — Telephone Encounter (Signed)
okay

## 2011-11-29 NOTE — Telephone Encounter (Signed)
Patient c/o thinking she has "thrush".  Has been on antibiotics for uti and yeast inf.  Wants to know if we can call in Magic mouth wash?

## 2011-11-29 NOTE — Telephone Encounter (Signed)
rx called in.  Lm for patient informed called in.

## 2012-03-20 ENCOUNTER — Encounter: Payer: Self-pay | Admitting: Obstetrics and Gynecology

## 2012-03-20 ENCOUNTER — Ambulatory Visit (INDEPENDENT_AMBULATORY_CARE_PROVIDER_SITE_OTHER): Payer: 59 | Admitting: Obstetrics and Gynecology

## 2012-03-20 VITALS — BP 122/78 | Ht 64.0 in | Wt 116.0 lb

## 2012-03-20 DIAGNOSIS — Z01419 Encounter for gynecological examination (general) (routine) without abnormal findings: Secondary | ICD-10-CM

## 2012-03-20 DIAGNOSIS — I10 Essential (primary) hypertension: Secondary | ICD-10-CM | POA: Insufficient documentation

## 2012-03-20 DIAGNOSIS — N809 Endometriosis, unspecified: Secondary | ICD-10-CM | POA: Insufficient documentation

## 2012-03-20 DIAGNOSIS — N879 Dysplasia of cervix uteri, unspecified: Secondary | ICD-10-CM | POA: Insufficient documentation

## 2012-03-20 DIAGNOSIS — G43909 Migraine, unspecified, not intractable, without status migrainosus: Secondary | ICD-10-CM | POA: Insufficient documentation

## 2012-03-20 DIAGNOSIS — M8580 Other specified disorders of bone density and structure, unspecified site: Secondary | ICD-10-CM | POA: Insufficient documentation

## 2012-03-20 MED ORDER — ESTRADIOL 2 MG VA RING: 2.0000 mg | VAGINAL_RING | VAGINAL | Status: DC

## 2012-03-20 NOTE — Patient Instructions (Signed)
Schedule mammogram. Schedule IV Reclast with Dr. Ricki Miller.

## 2012-03-20 NOTE — Progress Notes (Signed)
Patient came to see me today for her annual GYN exam. We have treated her osteopenia. She had initially taken Fosamax. She was switched to IV Reclast due  to severe reflux. She has done it twice. The last time was 2011. She is due for her mammogram in December. Her last bone density was 2012. Her worst T score was -1.9. She is status post vaginal hysterectomy, bilateral salpingo-oophorectomy done in 2000 for endometriosis. Prior to hysterectomy she had  cervical dysplasia and was treated with cryosurgery. This was 25-30 years ago. She has had normal Pap smears since then. Her last Pap smear was 2011. She does lab through her PCP. She is not sexually active. She is however having symptomatic atrophic vaginitis.  HEENT: Within normal limits.Kennon Portela present. Neck: No masses. Supraclavicular lymph nodes: Not enlarged. Breasts: Examined in both sitting and lying position. Symmetrical without skin changes or masses. Abdomen: Soft no masses guarding or rebound. No hernias. Pelvic: External within normal limits. BUS within normal limits. Vaginal examination shows poor estrogen effect, no cystocele enterocele or rectocele. Cervix and uterus absent. Adnexa within normal limits. Rectovaginal confirmatory. Extremities within normal limits.  Assessment: #1. Atrophic vaginitis. #2. Cervical dysplasia. #3. Osteopenia.  Plan. Estring ordered. Discussed over-the-counter lubrication. Information on hyalo GYN gel given. Pap not done.The new Pap smear guidelines were discussed with the patient. Plan IV Reclast. She is heard her internist does it in his office. If so she will do it there. If not she will call Filomena Jungling. Mammogram. Discussed followup bone density in June, 2014 after third Reclast.

## 2012-03-21 LAB — URINALYSIS W MICROSCOPIC + REFLEX CULTURE
Bacteria, UA: NONE SEEN
Casts: NONE SEEN
Crystals: NONE SEEN
Leukocytes, UA: NEGATIVE
Nitrite: NEGATIVE
Specific Gravity, Urine: 1.027 (ref 1.005–1.030)
Urobilinogen, UA: 0.2 mg/dL (ref 0.0–1.0)
pH: 5.5 (ref 5.0–8.0)

## 2012-03-27 ENCOUNTER — Telehealth: Payer: Self-pay | Admitting: Obstetrics and Gynecology

## 2012-03-27 NOTE — Telephone Encounter (Signed)
Patient called to let Dr. Reece Agar know Dr. Ricki Miller will authorize her Reclast but he needs you to fax the last Bone Density results with a note saying that you reviewed these results with patient last Monday.   Fax # [phone]

## 2012-03-28 NOTE — Telephone Encounter (Signed)
Records faxed per Dr. Timoteo Expose request. Patient informed we took care of her request.

## 2012-03-28 NOTE — Telephone Encounter (Signed)
Please send office note from 11-25. Last bone density was dec, 2012. I did not find it in epic. I suspect claudia sent it off to be scanned. Please let me know.

## 2012-03-29 ENCOUNTER — Encounter: Payer: Self-pay | Admitting: Obstetrics and Gynecology

## 2012-08-28 ENCOUNTER — Other Ambulatory Visit: Payer: Self-pay | Admitting: Internal Medicine

## 2012-08-28 DIAGNOSIS — R109 Unspecified abdominal pain: Secondary | ICD-10-CM

## 2012-08-30 ENCOUNTER — Inpatient Hospital Stay: Admission: RE | Admit: 2012-08-30 | Payer: 59 | Source: Ambulatory Visit

## 2012-08-30 ENCOUNTER — Ambulatory Visit
Admission: RE | Admit: 2012-08-30 | Discharge: 2012-08-30 | Disposition: A | Discharge status: Home / Self Care | Payer: 59 | Source: Ambulatory Visit | Attending: Internal Medicine | Admitting: Internal Medicine

## 2012-08-30 ENCOUNTER — Other Ambulatory Visit: Payer: Self-pay | Admitting: Internal Medicine

## 2012-08-30 DIAGNOSIS — R110 Nausea: Secondary | ICD-10-CM

## 2012-08-30 DIAGNOSIS — R197 Diarrhea, unspecified: Secondary | ICD-10-CM

## 2012-08-30 DIAGNOSIS — R109 Unspecified abdominal pain: Secondary | ICD-10-CM

## 2012-08-30 MED ORDER — IOHEXOL 300 MG/ML  SOLN: 100.0000 mL | Freq: Once | INTRAMUSCULAR | Status: DC | PRN

## 2012-08-30 MED ORDER — IOHEXOL 300 MG/ML  SOLN
100.0000 mL | Freq: Once | INTRAMUSCULAR | Status: AC | PRN
  Administered 2012-08-30: 100 mL via INTRAVENOUS

## 2012-09-25 ENCOUNTER — Other Ambulatory Visit: Payer: Self-pay | Admitting: Internal Medicine

## 2012-09-26 ENCOUNTER — Telehealth: Payer: Self-pay | Admitting: *Deleted

## 2012-09-26 NOTE — Telephone Encounter (Signed)
Sent refill

## 2012-11-22 ENCOUNTER — Other Ambulatory Visit: Payer: Self-pay | Admitting: *Deleted

## 2012-11-22 ENCOUNTER — Encounter: Payer: Self-pay | Admitting: Women's Health

## 2012-11-22 DIAGNOSIS — M8580 Other specified disorders of bone density and structure, unspecified site: Secondary | ICD-10-CM

## 2012-11-23 ENCOUNTER — Other Ambulatory Visit: Payer: Self-pay | Admitting: *Deleted

## 2012-11-23 DIAGNOSIS — M8580 Other specified disorders of bone density and structure, unspecified site: Secondary | ICD-10-CM

## 2012-11-28 ENCOUNTER — Other Ambulatory Visit: Payer: Self-pay | Admitting: *Deleted

## 2012-11-28 DIAGNOSIS — R928 Other abnormal and inconclusive findings on diagnostic imaging of breast: Secondary | ICD-10-CM

## 2012-12-04 ENCOUNTER — Other Ambulatory Visit: Payer: Self-pay | Admitting: *Deleted

## 2012-12-04 DIAGNOSIS — R928 Other abnormal and inconclusive findings on diagnostic imaging of breast: Secondary | ICD-10-CM

## 2012-12-05 ENCOUNTER — Encounter: Payer: Self-pay | Admitting: Women's Health

## 2012-12-05 ENCOUNTER — Other Ambulatory Visit: Payer: Self-pay | Admitting: Internal Medicine

## 2012-12-05 NOTE — Telephone Encounter (Signed)
Rx was sent to pharmacy electronically. 

## 2012-12-12 ENCOUNTER — Encounter: Payer: Self-pay | Admitting: *Deleted

## 2012-12-13 ENCOUNTER — Encounter: Payer: Self-pay | Admitting: Internal Medicine

## 2012-12-14 ENCOUNTER — Ambulatory Visit (INDEPENDENT_AMBULATORY_CARE_PROVIDER_SITE_OTHER): Payer: 59 | Admitting: Internal Medicine

## 2012-12-14 ENCOUNTER — Encounter: Payer: Self-pay | Admitting: Internal Medicine

## 2012-12-14 VITALS — BP 120/88 | HR 77 | Ht 64.0 in | Wt 120.3 lb

## 2012-12-14 DIAGNOSIS — I10 Essential (primary) hypertension: Secondary | ICD-10-CM

## 2012-12-14 MED ORDER — ROSUVASTATIN CALCIUM 10 MG PO TABS: 10.0000 mg | ORAL_TABLET | Freq: Every day | ORAL | Status: DC

## 2012-12-14 MED ORDER — TELMISARTAN-HCTZ 80-12.5 MG PO TABS: ORAL_TABLET | ORAL | Status: DC

## 2012-12-14 NOTE — Progress Notes (Signed)
OFFICE NOTE  Chief Complaint:  Routine followup  Primary Care Physician: Juline Patch, MD  HPI:  [name] is a 62 year old female I have been following for anxiety as well as difficult to control hypertension. Blood pressure has been much better controlled on her current regimen of Micardis/HCTZ as well as the metoprolol. She is also on Crestor for dyslipidemia and has had a well-controlled lipid profile. Recently, she has been concerned about fast intestinal transits, diarrhea, and/or stomach gurgling. She takes cholestyramine for this and was started on Dexilant for reflux-type symptoms. Although this has been helpful, she continues to have some of those complaints, but no other active cardiac complaints. Unfortunately, she continues to smoke. We discussed smoking cessation today. However, she is not quite ready to quit due to stress in her job. Fortunately her boss quit in the past year and her stress is improved significantly. She tells her she has 2 more years left and then she can retire with attention. Overall she thinks she is doing very well. The only other new issue is that she broke a small bone in the left foot but that has healed up very quickly.  PMHx:  Past Medical History  Diagnosis Date  . Active smoker   . Migraine   . Osteopenia     08/2006 and 08/2008 Dexa Scans w Yolanda Bonine   . Hypertension   . Endometriosis   . Cervical dysplasia   . Anxiety   . Dyslipidemia   . History of nuclear stress test 07/07/2010    dipyridamole; low risk, post-stress EF 65%    Past Surgical History  Procedure Laterality Date  . Vaginal hysterectomy  2000    WITH BSO  . Throat surgery      REMOVAL OF TUMOR  . Shoulder surgery      ROTATOR CUFF X 2  . Nose surgery      X2  . Back surgery  2002    RUPTURED DISC   . Cholecystectomy  2006  . Colposcopy    . Gynecologic cryosurgery    . Oophorectomy      BSO  . Cardiac catheterization  11/20/2002    patent coronary arteries       FAMHx:  Family History  Problem Relation Age of Onset  . Hypertension Mother   . Cancer Mother     LIVER AND LUNG  . Hypertension Brother   . Heart disease Brother   . Hypertension Maternal Grandfather   . Heart disease Maternal Grandfather   . Heart attack Maternal Grandfather     SOCHx:   reports that she has been smoking Cigarettes.  She has a 42 pack-year smoking history. She has never used smokeless tobacco. She reports that she drinks about 3.0 ounces of alcohol per week. She reports that she does not use illicit drugs.  ALLERGIES:  Allergies  Allergen Reactions  . Iodinated Diagnostic Agents Itching    itching but no hives just after iv contrast injection w/ 13 hr prep, future contrast not advised unless absolutely necessary, 13 hr prep would still be advised.//a.calhoun  . Omnipaque [Iohexol] Anaphylaxis  . Codeine Nausea And Vomiting  . Erythromycin   . Morphine And Related Nausea And Vomiting    ROS: A comprehensive review of systems was negative.  HOME MEDS: Current Outpatient Prescriptions  Medication Sig Dispense Refill  . Acetaminophen (TYLENOL PO) Take by mouth as needed.      . Acetaminophen-Guaifenesin 325-200 MG TABS Take by mouth as needed.      Marland Kitchen  ALPRAZolam (XANAX) 0.25 MG tablet Take 0.25 mg by mouth.        Marland Kitchen amLODipine (NORVASC) 5 MG tablet Take 5 mg by mouth. PLS VERIFY DOSE AS WE DO NOT HAVE IT ON FILE!       . CHOLESTYRAMINE PO Take by mouth.       . CLONIDINE HCL PO Take by mouth.        . dexlansoprazole (DEXILANT) 60 MG capsule Take 60 mg by mouth daily as needed.       . diphenhydrAMINE (SOMINEX) 25 MG tablet Take 25 mg by mouth at bedtime as needed for sleep.      . ergocalciferol (VITAMIN D2) 50000 UNITS capsule take 1 capsule by mouth every week  4 capsule  0  . estradiol (ESTRING) 2 MG vaginal ring Place 2 mg vaginally every 3 (three) months. follow package directions  1 each  4  . FLUTICASONE PROPIONATE NA Place into the nose as  needed.      . loperamide (IMODIUM) 2 MG capsule Take 2 mg by mouth 4 (four) times daily as needed for diarrhea or loose stools.      Marland Kitchen loratadine (CLARITIN) 10 MG tablet Take 10 mg by mouth daily. PRN       . metoprolol succinate (TOPROL-XL) 50 MG 24 hr tablet take 1 tablet by mouth once daily  30 tablet  3  . rosuvastatin (CRESTOR) 10 MG tablet Take 1 tablet (10 mg total) by mouth daily.  30 tablet  11  . telmisartan-hydrochlorothiazide (MICARDIS HCT) 80-12.5 MG per tablet take 1 tablet by mouth every morning  30 tablet  11  . VENTOLIN HFA 108 (90 BASE) MCG/ACT inhaler as needed.       No current facility-administered medications for this visit.    LABS/IMAGING: No results found for this or any previous visit (from the past 48 hour(s)). No results found.  VITALS: BP 120/88  Pulse 77  Ht 5\' 4"  (1.626 m)  Wt 120 lb 4.8 oz (54.568 kg)  BMI 20.64 kg/m2  EXAM: General appearance: alert and no distress Neck: no adenopathy, no carotid bruit, no JVD, supple, symmetrical, trachea midline and thyroid not enlarged, symmetric, no tenderness/mass/nodules Lungs: clear to auscultation bilaterally Heart: regular rate and rhythm, S1, S2 normal, no murmur, click, rub or gallop Abdomen: soft, non-tender; bowel sounds normal; no masses,  no organomegaly Extremities: extremities normal, atraumatic, no cyanosis or edema Pulses: 2+ and symmetric Skin: Skin color, texture, turgor normal. No rashes or lesions Neurologic: Grossly normal  EKG: Sinus rhythm at 77 with PACs  ASSESSMENT: 1. Hypertension-controlled 2. Asymptomatic PACs 3. Hyperlipidemia.  PLAN: 1.   this week and is doing well with her blood pressure control now on her current regimen. She stopped taking isosorbide and I think it's indicated she is no longer on this. I would recommend continuing her other blood pressure medications as she is at goal. She also has had pretty good cholesterol control, but is not taking her Crestor every  day. I encouraged her to continue to try to take it every day or at least a few times a week. Finally she had some asymptomatic PACs today. She reports she's had some palpitations on and off for most of her life but is not bothered by them. We'll plan to see her back in a year.  Chrystie Nose, MD, [facility] Attending Cardiologist [facility]  Aliyana Dlugosz C 12/14/2012, 5:00 PM

## 2012-12-14 NOTE — Patient Instructions (Signed)
Your physician wants you to follow-up in: 1 year. You will receive a reminder letter in the mail two months in advance. If you don't receive a letter, please call our office to schedule the follow-up appointment.  

## 2013-01-29 ENCOUNTER — Other Ambulatory Visit: Payer: Self-pay | Admitting: *Deleted

## 2013-01-29 ENCOUNTER — Other Ambulatory Visit: Payer: Self-pay | Admitting: Internal Medicine

## 2013-01-29 MED ORDER — AMLODIPINE BESYLATE 5 MG PO TABS: 5.0000 mg | ORAL_TABLET | Freq: Every day | ORAL | Status: DC

## 2013-01-29 NOTE — Telephone Encounter (Signed)
Rx was sent to pharmacy electronically. 

## 2013-07-26 ENCOUNTER — Telehealth: Payer: Self-pay | Admitting: *Deleted

## 2013-07-26 NOTE — Telephone Encounter (Signed)
I called patient regarding not scheduling her 6 month follow up from [facility]. We received a letter requesting patient to please contact. # given to patient and she is going to call.

## 2013-08-06 ENCOUNTER — Encounter: Payer: Self-pay | Admitting: Women's Health

## 2013-11-30 ENCOUNTER — Encounter: Payer: Self-pay | Admitting: Women's Health

## 2013-12-14 ENCOUNTER — Encounter: Payer: Self-pay | Admitting: Internal Medicine

## 2013-12-14 ENCOUNTER — Ambulatory Visit (INDEPENDENT_AMBULATORY_CARE_PROVIDER_SITE_OTHER): Payer: 59 | Admitting: Internal Medicine

## 2013-12-14 VITALS — BP 122/80 | HR 71 | Ht 63.75 in | Wt 119.5 lb

## 2013-12-14 DIAGNOSIS — E785 Hyperlipidemia, unspecified: Secondary | ICD-10-CM

## 2013-12-14 DIAGNOSIS — I10 Essential (primary) hypertension: Secondary | ICD-10-CM

## 2013-12-14 DIAGNOSIS — R9431 Abnormal electrocardiogram [ECG] [EKG]: Secondary | ICD-10-CM | POA: Insufficient documentation

## 2013-12-14 MED ORDER — DEXLANSOPRAZOLE 60 MG PO CPDR: 60.0000 mg | DELAYED_RELEASE_CAPSULE | Freq: Every day | ORAL | Status: DC | PRN

## 2013-12-14 MED ORDER — ROSUVASTATIN CALCIUM 10 MG PO TABS: 10.0000 mg | ORAL_TABLET | Freq: Every day | ORAL | Status: DC

## 2013-12-14 NOTE — Progress Notes (Signed)
OFFICE NOTE  Chief Complaint:  Routine followup  Primary Care Physician: Tommy Medal, MD  HPI:  [name] is a 63 year old female I have been following for anxiety as well as difficult to control hypertension. Blood pressure has been much better controlled on her current regimen of Micardis/HCTZ as well as the metoprolol. She is also on Crestor for dyslipidemia and has had a well-controlled lipid profile. Recently, she has been concerned about fast intestinal transits, diarrhea, and/or stomach gurgling. She takes cholestyramine for this and was started on Dexilant for reflux-type symptoms. Although this has been helpful, she continues to have some of those complaints, but no other active cardiac complaints. Unfortunately, she continues to smoke. We discussed smoking cessation today. However, she is not quite ready to quit due to stress in her job. Fortunately her boss quit in the past year and her stress is improved significantly. She tells her she has 2 more years left and then she can retire with attention. Overall she thinks she is doing very well. The only other new issue is that she broke a small bone in the left foot but that has healed up very quickly.  [name] returns today for followup. She reports having some upper midepigastric pain but has not been taking her PPI regularly. Just reports not taking her cholesterol medication regularly. She doesn't be taken her blood pressure medicines and her blood pressure is well-controlled today.  PMHx:  Past Medical History  Diagnosis Date  . Active smoker   . Migraine   . Osteopenia     08/2006 and 08/2008 Dexa Scans w Isaiah Blakes   . Hypertension   . Endometriosis   . Cervical dysplasia   . Anxiety   . Dyslipidemia   . History of nuclear stress test 07/07/2010    dipyridamole; low risk, post-stress EF 65%    Past Surgical History  Procedure Laterality Date  . Vaginal hysterectomy  2000    WITH BSO  . Throat  surgery      REMOVAL OF TUMOR  . Shoulder surgery      ROTATOR CUFF X 2  . Nose surgery      X2  . Back surgery  2002    RUPTURED DISC   . Cholecystectomy  2006  . Colposcopy    . Gynecologic cryosurgery    . Oophorectomy      BSO  . Cardiac catheterization  11/20/2002    patent coronary arteries     FAMHx:  Family History  Problem Relation Age of Onset  . Hypertension Mother   . Cancer Mother     LIVER AND LUNG  . Hypertension Brother   . Heart disease Brother   . Hypertension Maternal Grandfather   . Heart disease Maternal Grandfather   . Heart attack Maternal Grandfather     SOCHx:   reports that she has been smoking Cigarettes.  She has a 42 pack-year smoking history. She has never used smokeless tobacco. She reports that she drinks about 3 - 4 ounces of alcohol per week. She reports that she does not use illicit drugs.  ALLERGIES:  Allergies  Allergen Reactions  . Iodinated Diagnostic Agents Itching    itching but no hives just after iv contrast injection w/ 13 hr prep, future contrast not advised unless absolutely necessary, 13 hr prep would still be advised.//a.calhoun  . Omnipaque [Iohexol] Anaphylaxis  . Codeine Nausea And Vomiting  . Erythromycin   . Morphine And Related Nausea And Vomiting  ROS: A comprehensive review of systems was negative except for: Gastrointestinal: positive for abdominal pain and reflux symptoms  HOME MEDS: Current Outpatient Prescriptions  Medication Sig Dispense Refill  . Acetaminophen (TYLENOL PO) Take by mouth as needed.      . ALPRAZolam (XANAX) 0.25 MG tablet Take 0.25 mg by mouth.        Marland Kitchen amLODipine (NORVASC) 5 MG tablet Take 1 tablet (5 mg total) by mouth daily.  30 tablet  11  . CHOLESTYRAMINE PO Take by mouth daily.       Marland Kitchen dexlansoprazole (DEXILANT) 60 MG capsule Take 1 capsule (60 mg total) by mouth daily as needed.  30 capsule  11  . diphenhydrAMINE (SOMINEX) 25 MG tablet Take 25 mg by mouth at bedtime as needed  for sleep.      . famotidine (PEPCID) 10 MG tablet Take 10 mg by mouth daily as needed for heartburn or indigestion.      Marland Kitchen FLUTICASONE PROPIONATE NA Place into the nose as needed.      . loperamide (IMODIUM) 2 MG capsule Take 2 mg by mouth 4 (four) times daily as needed for diarrhea or loose stools.      Marland Kitchen loratadine (CLARITIN) 10 MG tablet Take 10 mg by mouth daily. PRN       . metoprolol succinate (TOPROL-XL) 50 MG 24 hr tablet Take 1 tablet (50 mg total) by mouth daily.  30 tablet  10  . rosuvastatin (CRESTOR) 10 MG tablet Take 1 tablet (10 mg total) by mouth daily.  30 tablet  11  . telmisartan-hydrochlorothiazide (MICARDIS HCT) 80-12.5 MG per tablet take 1 tablet by mouth every morning  30 tablet  11  . temazepam (RESTORIL) 15 MG capsule Take 1 capsule by mouth at bedtime as needed.      . tiotropium (SPIRIVA) 18 MCG inhalation capsule Place 18 mcg into inhaler and inhale daily.      . VENTOLIN HFA 108 (90 BASE) MCG/ACT inhaler as needed.       No current facility-administered medications for this visit.    LABS/IMAGING: No results found for this or any previous visit (from the past 48 hour(s)). No results found.  VITALS: BP 122/80  Pulse 71  Ht 5' 3.75" (1.619 m)  Wt 119 lb 8 oz (54.205 kg)  BMI 20.68 kg/m2  EXAM: General appearance: alert and no distress Neck: no adenopathy, no carotid bruit, no JVD, supple, symmetrical, trachea midline and thyroid not enlarged, symmetric, no tenderness/mass/nodules Lungs: clear to auscultation bilaterally Heart: regular rate and rhythm, S1, S2 normal, no murmur, click, rub or gallop Abdomen: soft, non-tender; bowel sounds normal; no masses,  no organomegaly Extremities: extremities normal, atraumatic, no cyanosis or edema Pulses: 2+ and symmetric Skin: Skin color, texture, turgor normal. No rashes or lesions Neurologic: Grossly normal  EKG: Sinus rhythm at 71, non-specific TW  changes  ASSESSMENT: 1. Hypertension-controlled 2. Hyperlipidemia (not taking medications)  PLAN: 1.   [name] is doing well with good blood pressure control. Unfortunately she's not taking her cholesterol medication as prescribed. Every written for that prescription and encouraged her to take it. In addition a she's having some reflux complaints and is not taking heard excellent as prescribed. She says she has been taking some over her reflux medication but is not clear. The patient would do better on that medication and I gave her prescription for it. Let us see her back annually or sooner as necessary.  Pixie Casino, MD, [facility] Attending  Cardiologist [facility] 12/14/2013, 8:36 AM

## 2013-12-14 NOTE — Patient Instructions (Signed)
Your physician wants you to follow-up in: 1 year. You will receive a reminder letter in the mail two months in advance. If you don't receive a letter, please call our office to schedule the follow-up appointment.  

## 2013-12-18 ENCOUNTER — Other Ambulatory Visit: Payer: Self-pay | Admitting: Internal Medicine

## 2013-12-18 NOTE — Telephone Encounter (Signed)
Rx was sent to pharmacy electronically. 

## 2014-02-20 ENCOUNTER — Other Ambulatory Visit: Payer: Self-pay | Admitting: Internal Medicine

## 2014-02-20 NOTE — Telephone Encounter (Signed)
Rx was sent to pharmacy electronically. 

## 2014-02-25 ENCOUNTER — Encounter: Payer: Self-pay | Admitting: Internal Medicine

## 2014-02-26 ENCOUNTER — Other Ambulatory Visit: Payer: Self-pay

## 2014-05-02 ENCOUNTER — Ambulatory Visit (INDEPENDENT_AMBULATORY_CARE_PROVIDER_SITE_OTHER): Payer: 59 | Admitting: Women's Health

## 2014-05-02 ENCOUNTER — Other Ambulatory Visit (HOSPITAL_COMMUNITY)
Admission: RE | Admit: 2014-05-02 | Discharge: 2014-05-02 | Disposition: A | Discharge status: Home / Self Care | Payer: 59 | Source: Ambulatory Visit | Attending: Gynecology | Admitting: Gynecology

## 2014-05-02 ENCOUNTER — Encounter: Payer: Self-pay | Admitting: Women's Health

## 2014-05-02 VITALS — BP 110/80 | Ht 64.0 in | Wt 120.0 lb

## 2014-05-02 DIAGNOSIS — M8580 Other specified disorders of bone density and structure, unspecified site: Secondary | ICD-10-CM

## 2014-05-02 DIAGNOSIS — Z01419 Encounter for gynecological examination (general) (routine) without abnormal findings: Secondary | ICD-10-CM | POA: Diagnosis present

## 2014-05-02 DIAGNOSIS — Z23 Encounter for immunization: Secondary | ICD-10-CM

## 2014-05-02 MED ORDER — VITAMIN D (ERGOCALCIFEROL) 1.25 MG (50000 UNIT) PO CAPS: 50000.0000 [IU] | ORAL_CAPSULE | ORAL | Status: DC

## 2014-05-02 NOTE — Progress Notes (Signed)
[name] [birth year] [id number]    History:    Presents for annual exam.  2000 TVH with BSO for endometriosis history of abnormal Pap treated with cryotherapy prior. Normal colonoscopy 2001, 2011. Has had Zostavax. 2014 Osteopenia T score -2, managed by primary care on Prolia for the past 2 years, Reclast 1 dose, prior to that Fosamax originally but had problems with side effects. Not sexually active in years.  Past medical history, past surgical history, family history and social history were all reviewed and documented in the EPIC chart. Works for the [employer]. Planning to retire this year. Has 2 children, 6 grandchildren all doing well.   ROS:  A ROS was performed and pertinent positives and negatives are included.  Exam:  Filed Vitals:   05/02/14 1520  BP: 110/80    General appearance:  Normal Thyroid:  Symmetrical, normal in size, without palpable masses or nodularity. Respiratory  Auscultation:  Clear without wheezing or rhonchi Cardiovascular  Auscultation:  Regular rate, without rubs, murmurs or gallops  Edema/varicosities:  Not grossly evident Abdominal  Soft,nontender, without masses, guarding or rebound.  Liver/spleen:  No organomegaly noted  Hernia:  None appreciated  Skin  Inspection:  Grossly normal   Breasts: Examined lying and sitting/pendulous.     Right: Without masses, retractions, discharge or axillary adenopathy.     Left: Without masses, retractions, discharge or axillary adenopathy. Gentitourinary   Inguinal/mons:  Normal without inguinal adenopathy  External genitalia:  Normal  BUS/Urethra/Skene's glands:  Normal  Vagina:  Normal  Cervix:  Absent Uterus: Absent Adnexa/parametria:     Rt: Without masses or tenderness.   Lt: Without masses or tenderness.  Anus and perineum: Normal  Digital rectal exam: Normal sphincter tone without palpated masses or tenderness  Assessment/Plan:  64 y.o. DWF G2P2 for annual exam.   TVH/BSO for  endometriosis on no HRT Smoker Osteopenia primary care manages Prolia Hypertension/heart disease primary care manages labs and meds  Plan: T dap today, encouraged Pneumovax will discuss with primary care. Aware of hazards of smoking trying to quit. Reviewed importance of home safety, fall prevention and regular exercise. SBE's, continue annual 3-D tomography history of dense breasts. UA, Pap. Reviewed if Pap normal would not need to do any further Paps.   Huel Cote [facility], 5:17 PM 05/02/2014

## 2014-05-02 NOTE — Patient Instructions (Signed)

## 2014-05-03 LAB — VITAMIN D 25 HYDROXY (VIT D DEFICIENCY, FRACTURES): Vit D, 25-Hydroxy: 25 ng/mL — ABNORMAL LOW (ref 30–100)

## 2014-05-03 LAB — URINALYSIS W MICROSCOPIC + REFLEX CULTURE
BACTERIA UA: NONE SEEN
BILIRUBIN URINE: NEGATIVE
CASTS: NONE SEEN
Crystals: NONE SEEN
GLUCOSE, UA: NEGATIVE mg/dL
HGB URINE DIPSTICK: NEGATIVE
KETONES UR: NEGATIVE mg/dL
Leukocytes, UA: NEGATIVE
NITRITE: NEGATIVE
PROTEIN: NEGATIVE mg/dL
Specific Gravity, Urine: 1.019 (ref 1.005–1.030)
Squamous Epithelial / LPF: NONE SEEN
Urobilinogen, UA: 0.2 mg/dL (ref 0.0–1.0)
pH: 5 (ref 5.0–8.0)

## 2014-05-06 ENCOUNTER — Other Ambulatory Visit: Payer: Self-pay | Admitting: Women's Health

## 2014-05-06 DIAGNOSIS — E559 Vitamin D deficiency, unspecified: Secondary | ICD-10-CM

## 2014-05-06 LAB — CYTOLOGY - PAP

## 2014-07-25 ENCOUNTER — Telehealth: Payer: Self-pay | Admitting: *Deleted

## 2014-07-25 ENCOUNTER — Other Ambulatory Visit: Payer: 59

## 2014-07-25 DIAGNOSIS — E559 Vitamin D deficiency, unspecified: Secondary | ICD-10-CM

## 2014-07-25 DIAGNOSIS — R3915 Urgency of urination: Secondary | ICD-10-CM

## 2014-07-25 LAB — URINALYSIS W MICROSCOPIC + REFLEX CULTURE
GLUCOSE, UA: NEGATIVE mg/dL
Hgb urine dipstick: NEGATIVE
Ketones, ur: NEGATIVE mg/dL
Leukocytes, UA: NEGATIVE
Nitrite: NEGATIVE
Protein, ur: 30 mg/dL — AB
Specific Gravity, Urine: >1.03 — ABNORMAL HIGH (ref 1.005–1.030)
Urobilinogen, UA: 0.2 mg/dL (ref 0.0–1.0)
pH: 5 (ref 5.0–8.0)

## 2014-07-25 NOTE — Telephone Encounter (Signed)
Pt aware.

## 2014-07-25 NOTE — Telephone Encounter (Signed)
ok 

## 2014-07-25 NOTE — Telephone Encounter (Signed)
Pt is coming in today to have Vitamin D level checked, would like to leave a u/a as well c/o lower back discomfort, urgency. Order will be placed.

## 2014-07-26 LAB — VITAMIN D 25 HYDROXY (VIT D DEFICIENCY, FRACTURES): VIT D 25 HYDROXY: 35 ng/mL (ref 30–100)

## 2014-09-13 ENCOUNTER — Other Ambulatory Visit: Payer: Self-pay | Admitting: Internal Medicine

## 2014-09-13 DIAGNOSIS — R42 Dizziness and giddiness: Secondary | ICD-10-CM

## 2014-09-13 DIAGNOSIS — R2689 Other abnormalities of gait and mobility: Secondary | ICD-10-CM

## 2014-09-20 ENCOUNTER — Ambulatory Visit
Admission: RE | Admit: 2014-09-20 | Discharge: 2014-09-20 | Disposition: A | Discharge status: Home / Self Care | Payer: 59 | Source: Ambulatory Visit | Attending: Internal Medicine | Admitting: Internal Medicine

## 2014-09-20 DIAGNOSIS — R2689 Other abnormalities of gait and mobility: Secondary | ICD-10-CM

## 2014-09-20 DIAGNOSIS — R42 Dizziness and giddiness: Secondary | ICD-10-CM

## 2014-09-25 ENCOUNTER — Telehealth: Payer: Self-pay | Admitting: Internal Medicine

## 2014-10-01 NOTE — Telephone Encounter (Signed)
Close encounter 

## 2014-11-26 ENCOUNTER — Other Ambulatory Visit: Payer: Self-pay | Admitting: Women's Health

## 2014-11-26 NOTE — Telephone Encounter (Signed)
Per 07/25/14  Result note "Please call and review vitamin D level 35, normal range can now use over-the-counter 2000 vitamin D daily." rx will be denied

## 2014-12-02 ENCOUNTER — Encounter: Payer: Self-pay | Admitting: Internal Medicine

## 2014-12-02 ENCOUNTER — Telehealth: Payer: Self-pay | Admitting: Internal Medicine

## 2014-12-06 ENCOUNTER — Encounter: Payer: Self-pay | Admitting: Women's Health

## 2014-12-06 NOTE — Telephone Encounter (Signed)
Close encounter 

## 2014-12-09 ENCOUNTER — Encounter: Payer: Self-pay | Admitting: Women's Health

## 2014-12-11 ENCOUNTER — Telehealth: Payer: Self-pay | Admitting: Gynecology

## 2014-12-11 NOTE — Telephone Encounter (Signed)
[name] needs prolia, DEXA is stable, had no Prolia last year due to a mix up at her primary care, (had several doses prior, 1 infusion of reclast) she is going to fax a calcium level/CMP that she had done at primary care. Please check coverage and schedule with patient. Thanks  Above note from [employer]  Per Elon Alas, place the above note on hold for now. Pt did receive her Prolia in May 2016

## 2014-12-17 ENCOUNTER — Ambulatory Visit: Payer: 59 | Admitting: Internal Medicine

## 2014-12-18 NOTE — Telephone Encounter (Signed)
Per Christ Kick. She will receive her Prolia at her PCP this is where she started her Prolia and will continue.

## 2014-12-20 ENCOUNTER — Other Ambulatory Visit: Payer: Self-pay | Admitting: Internal Medicine

## 2014-12-20 NOTE — Telephone Encounter (Signed)
Rx request sent to pharmacy.  

## 2015-01-20 ENCOUNTER — Other Ambulatory Visit: Payer: Self-pay | Admitting: Internal Medicine

## 2015-01-20 NOTE — Telephone Encounter (Signed)
°  1. Which medications need to be refilled? Telmisartan- HCTZ   2. Which pharmacy is medication to be sent to? [facility] on [address]   3. Do they need a 30 day or 90 day supply? She would like a 90 day supply since Friday is her last day at her job  4. Would they like a call back once the medication has been sent to the pharmacy? Yes

## 2015-01-20 NOTE — Telephone Encounter (Signed)
Rx request sent to pharmacy.  

## 2015-01-22 ENCOUNTER — Ambulatory Visit (INDEPENDENT_AMBULATORY_CARE_PROVIDER_SITE_OTHER): Payer: 59 | Admitting: Internal Medicine

## 2015-01-22 ENCOUNTER — Encounter: Payer: Self-pay | Admitting: Internal Medicine

## 2015-01-22 VITALS — BP 120/84 | HR 88 | Ht 64.0 in | Wt 118.6 lb

## 2015-01-22 DIAGNOSIS — Z720 Tobacco use: Secondary | ICD-10-CM

## 2015-01-22 DIAGNOSIS — I10 Essential (primary) hypertension: Secondary | ICD-10-CM

## 2015-01-22 DIAGNOSIS — R9431 Abnormal electrocardiogram [ECG] [EKG]: Secondary | ICD-10-CM

## 2015-01-22 DIAGNOSIS — E785 Hyperlipidemia, unspecified: Secondary | ICD-10-CM | POA: Diagnosis not present

## 2015-01-22 MED ORDER — TELMISARTAN-HCTZ 80-12.5 MG PO TABS: 1.0000 | ORAL_TABLET | Freq: Every morning | ORAL | Status: DC

## 2015-01-22 NOTE — Patient Instructions (Signed)
Follow-up annually

## 2015-01-22 NOTE — Progress Notes (Signed)
OFFICE NOTE  Chief Complaint:  Routine followup  Primary Care Physician: Melanie Gravel, MD  HPI:  [name] is a 64 year old female I have been following for anxiety as well as difficult to control hypertension. Blood pressure has been much better controlled on her current regimen of Micardis/HCTZ as well as the metoprolol. She is also on Crestor for dyslipidemia and has had a well-controlled lipid profile. Recently, she has been concerned about fast intestinal transits, diarrhea, and/or stomach gurgling. She takes cholestyramine for this and was started on Dexilant for reflux-type symptoms. Although this has been helpful, she continues to have some of those complaints, but no other active cardiac complaints. Unfortunately, she continues to smoke. We discussed smoking cessation today. However, she is not quite ready to quit due to stress in her job. Fortunately her boss quit in the past year and her stress is improved significantly. She tells her she has 2 more years left and then she can retire with attention. Overall she thinks she is doing very well. The only other new issue is that she broke a small bone in the left foot but that has healed up very quickly.  [name] returns today for followup. She reports having some upper midepigastric pain but has not been taking her PPI regularly. Just reports not taking her cholesterol medication regularly. She doesn't be taken her blood pressure medicines and her blood pressure is well-controlled today.  I saw [name] back today in follow-up. She tells me that she is only a few days away from retirement. Unfortunately there'll be a 3-4 week delay in insurance coverage before she can start with her new state insurance. She was told that she could get insurance coverage from [place] however the cost would be more than $800. From a medical standpoint she seems to be doing well and is interested in quitting cigarettes. She got a prescription  for Chantix but has not yet started. Blood pressure is well-controlled. She has had good cholesterol control on Crestor.  PMHx:  Past Medical History  Diagnosis Date  . Active smoker   . Migraine   . Osteopenia     08/2006 and 08/2008 Dexa Scans w Isaiah Blakes   . Hypertension   . Endometriosis   . Cervical dysplasia   . Anxiety   . Dyslipidemia   . History of nuclear stress test 07/07/2010    dipyridamole; low risk, post-stress EF 65%    Past Surgical History  Procedure Laterality Date  . Vaginal hysterectomy  2000    WITH BSO  . Throat surgery      REMOVAL OF TUMOR  . Shoulder surgery      ROTATOR CUFF X 2  . Nose surgery      X2  . Back surgery  2002    RUPTURED DISC   . Cholecystectomy  2006  . Colposcopy    . Gynecologic cryosurgery    . Oophorectomy      BSO  . Cardiac catheterization  11/20/2002    patent coronary arteries     FAMHx:  Family History  Problem Relation Age of Onset  . Hypertension Mother   . Cancer Mother     LIVER AND LUNG  . Hypertension Brother   . Heart disease Brother   . Hypertension Maternal Grandfather   . Heart disease Maternal Grandfather   . Heart attack Maternal Grandfather     SOCHx:   reports that she has been smoking Cigarettes.  She has a  42 pack-year smoking history. She has never used smokeless tobacco. She reports that she drinks about 3.6 - 4.8 oz of alcohol per week. She reports that she does not use illicit drugs.  ALLERGIES:  Allergies  Allergen Reactions  . Iodinated Diagnostic Agents Itching    itching but no hives just after iv contrast injection w/ 13 hr prep, future contrast not advised unless absolutely necessary, 13 hr prep would still be advised.//a.calhoun  . Omnipaque [Iohexol] Anaphylaxis  . Codeine Nausea And Vomiting  . Erythromycin   . Morphine And Related Nausea And Vomiting    ROS: A comprehensive review of systems was negative.  HOME MEDS: Current Outpatient Prescriptions  Medication Sig  Dispense Refill  . Acetaminophen (TYLENOL PO) Take by mouth as needed.    . ALPRAZolam (XANAX) 0.25 MG tablet Take 0.25 mg by mouth at bedtime as needed.     Marland Kitchen amLODipine (NORVASC) 5 MG tablet take 1 tablet by mouth once daily 30 tablet 10  . CHANTIX CONTINUING MONTH PAK 1 MG tablet   0  . CHANTIX STARTING MONTH PAK 0.5 MG X 11 & 1 MG X 42 tablet   0  . cholestyramine (QUESTRAN) 4 G packet Take 1 packet by mouth daily.  1  . CHOLESTYRAMINE PO Take by mouth daily.     . CRESTOR 10 MG tablet take 1 tablet by mouth once daily 30 tablet 11  . DEXILANT 60 MG capsule take 1 capsule by mouth once daily if needed 30 capsule 11  . FLUTICASONE PROPIONATE NA Place into the nose as needed.    . loperamide (IMODIUM) 2 MG capsule Take 2 mg by mouth 4 (four) times daily as needed for diarrhea or loose stools.    Marland Kitchen loratadine (CLARITIN) 10 MG tablet Take 10 mg by mouth daily. PRN     . metoprolol succinate (TOPROL-XL) 50 MG 24 hr tablet take 1 tablet by mouth once daily 30 tablet 10  . telmisartan-hydrochlorothiazide (MICARDIS HCT) 80-12.5 MG tablet Take 1 tablet by mouth every morning. 90 tablet 3  . temazepam (RESTORIL) 15 MG capsule Take 1 capsule by mouth at bedtime as needed.    . tiotropium (SPIRIVA) 18 MCG inhalation capsule Place 18 mcg into inhaler and inhale daily.    . VENTOLIN HFA 108 (90 BASE) MCG/ACT inhaler as needed.     No current facility-administered medications for this visit.    LABS/IMAGING: No results found for this or any previous visit (from the past 48 hour(s)). No results found.  VITALS: BP 120/84 mmHg  Pulse 88  Ht '5\' 4"'$  (1.626 m)  Wt 118 lb 9.6 oz (53.797 kg)  BMI 20.35 kg/m2  EXAM: General appearance: alert and no distress Neck: no adenopathy, no carotid bruit, no JVD, supple, symmetrical, trachea midline and thyroid not enlarged, symmetric, no tenderness/mass/nodules Lungs: clear to auscultation bilaterally Heart: regular rate and rhythm, S1, S2 normal, no murmur,  click, rub or gallop Abdomen: soft, non-tender; bowel sounds normal; no masses,  no organomegaly Extremities: extremities normal, atraumatic, no cyanosis or edema Pulses: 2+ and symmetric Skin: Skin color, texture, turgor normal. No rashes or lesions Neurologic: Grossly normal  EKG: Normal sinus rhythm at 88  ASSESSMENT: 1. Hypertension-controlled 2. Hyperlipidemia- on Crestor 3. Tobacco abuse-ready to quit with Chantix  PLAN: 1.   Mrs. Darthula is doing well. Her blood pressures controlled and she is now on Crestor. Cholesterol is followed by her primary care provider. She's not ready to quit smoking and has  a prescription for Chantix. She says she only needs to retire. Hopefully this will happen in a few days. Plan to see her back annually or sooner as necessary.  Pixie Casino, MD, [facility] Attending Cardiologist [place] 01/22/2015, 1:17 PM

## 2015-04-10 ENCOUNTER — Other Ambulatory Visit: Payer: Self-pay | Admitting: Internal Medicine

## 2015-04-11 NOTE — Telephone Encounter (Signed)
Rx request sent to pharmacy.  

## 2015-10-14 ENCOUNTER — Encounter: Payer: Self-pay | Admitting: Hematology and Oncology

## 2015-10-14 ENCOUNTER — Telehealth: Payer: Self-pay | Admitting: Hematology and Oncology

## 2015-10-14 NOTE — Telephone Encounter (Signed)
Telephone call to patient to schedule appointment. Scheduled with Gudena on 6/26 at 1pm, voiced understanding. Demographics verified, address and location given to the patient. Letter to referring.

## 2015-10-20 ENCOUNTER — Telehealth: Payer: Self-pay | Admitting: Hematology and Oncology

## 2015-10-20 ENCOUNTER — Ambulatory Visit (HOSPITAL_BASED_OUTPATIENT_CLINIC_OR_DEPARTMENT_OTHER): Payer: BC Managed Care – PPO

## 2015-10-20 ENCOUNTER — Encounter: Payer: Self-pay | Admitting: Hematology and Oncology

## 2015-10-20 ENCOUNTER — Ambulatory Visit (HOSPITAL_BASED_OUTPATIENT_CLINIC_OR_DEPARTMENT_OTHER): Payer: BC Managed Care – PPO | Admitting: Hematology and Oncology

## 2015-10-20 VITALS — BP 131/92 | HR 77 | Temp 98.7°F | Resp 18 | Wt 112.5 lb

## 2015-10-20 DIAGNOSIS — R7989 Other specified abnormal findings of blood chemistry: Secondary | ICD-10-CM

## 2015-10-20 DIAGNOSIS — Z720 Tobacco use: Secondary | ICD-10-CM

## 2015-10-20 DIAGNOSIS — D751 Secondary polycythemia: Secondary | ICD-10-CM

## 2015-10-20 LAB — CBC WITH DIFFERENTIAL/PLATELET
BASO%: 1.3 % (ref 0.0–2.0)
BASOS ABS: 0.1 10*3/uL (ref 0.0–0.1)
EOS%: 2.4 % (ref 0.0–7.0)
Eosinophils Absolute: 0.1 10*3/uL (ref 0.0–0.5)
HEMATOCRIT: 46.4 % (ref 34.8–46.6)
HEMOGLOBIN: 15.6 g/dL (ref 11.6–15.9)
LYMPH#: 2 10*3/uL (ref 0.9–3.3)
LYMPH%: 40.2 % (ref 14.0–49.7)
MCH: 33.3 pg (ref 25.1–34.0)
MCHC: 33.7 g/dL (ref 31.5–36.0)
MCV: 98.9 fL (ref 79.5–101.0)
MONO#: 0.3 10*3/uL (ref 0.1–0.9)
MONO%: 6.2 % (ref 0.0–14.0)
NEUT%: 49.9 % (ref 38.4–76.8)
NEUTROS ABS: 2.4 10*3/uL (ref 1.5–6.5)
Platelets: 205 10*3/uL (ref 145–400)
RBC: 4.7 10*6/uL (ref 3.70–5.45)
RDW: 13.1 % (ref 11.2–14.5)
WBC: 4.9 10*3/uL (ref 3.9–10.3)

## 2015-10-20 LAB — IRON AND TIBC
%SAT: 36 % (ref 21–57)
Iron: 118 ug/dL (ref 41–142)
TIBC: 325 ug/dL (ref 236–444)
UIBC: 207 ug/dL (ref 120–384)

## 2015-10-20 LAB — FERRITIN: FERRITIN: 776 ng/mL — AB (ref 9–269)

## 2015-10-20 NOTE — Telephone Encounter (Signed)
appt made and avs printed. Pt sent to lab per [facility] orders

## 2015-10-20 NOTE — Progress Notes (Signed)
[place] NOTE  Patient Care Team: Jani Gravel, MD as PCP - General (Internal Medicine)  CHIEF COMPLAINTS/PURPOSE OF CONSULTATION:  Elevated hemoglobin and ferritin  HISTORY OF PRESENTING ILLNESS:  [name] 65 y.o. female is here because of recent diagnosis of elevated hemoglobin of 16.5 and elevated ferritin of 344. Patient has a past medical history significant for tobacco abuse, obstructive sleep apnea, COPD, hypertension, hyperlipidemia, GERD. She reports that all her life she was told that she has "man blood" probably referring to elevation of hemoglobin. Patient has face mask for CAPP to treat obstructive sleep apnea.  I reviewed her records extensively and collaborated the history with the patient.  MEDICAL HISTORY:  Past Medical History  Diagnosis Date  . Active smoker   . Migraine   . Osteopenia     08/2006 and 08/2008 Dexa Scans w Isaiah Blakes   . Hypertension   . Endometriosis   . Cervical dysplasia   . Anxiety   . Dyslipidemia   . History of nuclear stress test 07/07/2010    dipyridamole; low risk, post-stress EF 65%    SURGICAL HISTORY: Past Surgical History  Procedure Laterality Date  . Vaginal hysterectomy  2000    WITH BSO  . Throat surgery      REMOVAL OF TUMOR  . Shoulder surgery      ROTATOR CUFF X 2  . Nose surgery      X2  . Back surgery  2002    RUPTURED DISC   . Cholecystectomy  2006  . Colposcopy    . Gynecologic cryosurgery    . Oophorectomy      BSO  . Cardiac catheterization  11/20/2002    patent coronary arteries     SOCIAL HISTORY: Social History   Social History  . Marital Status: Divorced    Spouse Name: N/A  . Number of Children: N/A  . Years of Education: N/A   Occupational History  . Not on file.   Social History Main Topics  . Smoking status: Current Every Day Smoker -- 1.00 packs/day for 42 years    Types: Cigarettes  . Smokeless tobacco: Never Used  . Alcohol Use: 3.6 - 4.8 oz/week    6-8  Standard drinks or equivalent per week  . Drug Use: No  . Sexual Activity: No     Comment: INTERCOUSRE AGE 16,SEXUAL PARTNERS MORE THAN 5   Other Topics Concern  . Not on file   Social History Narrative    FAMILY HISTORY: Family History  Problem Relation Age of Onset  . Hypertension Mother   . Cancer Mother     LIVER AND LUNG  . Hypertension Brother   . Heart disease Brother   . Hypertension Maternal Grandfather   . Heart disease Maternal Grandfather   . Heart attack Maternal Grandfather     ALLERGIES:  is allergic to iodinated diagnostic agents; omnipaque; codeine; erythromycin; and morphine and related.  MEDICATIONS:  Current Outpatient Prescriptions  Medication Sig Dispense Refill  . Acetaminophen (TYLENOL PO) Take by mouth as needed.    . ALPRAZolam (XANAX) 0.25 MG tablet Take 0.25 mg by mouth at bedtime as needed.     Marland Kitchen amLODipine (NORVASC) 5 MG tablet take 1 tablet by mouth once daily 30 tablet 6  . CHANTIX CONTINUING MONTH PAK 1 MG tablet   0  . CHANTIX STARTING MONTH PAK 0.5 MG X 11 & 1 MG X 42 tablet   0  . cholestyramine (QUESTRAN)  4 G packet Take 1 packet by mouth daily.  1  . CHOLESTYRAMINE PO Take by mouth daily.     . CRESTOR 10 MG tablet take 1 tablet by mouth once daily 30 tablet 11  . DEXILANT 60 MG capsule take 1 capsule by mouth once daily if needed 30 capsule 11  . FLUTICASONE PROPIONATE NA Place into the nose as needed.    . loperamide (IMODIUM) 2 MG capsule Take 2 mg by mouth 4 (four) times daily as needed for diarrhea or loose stools.    Marland Kitchen loratadine (CLARITIN) 10 MG tablet Take 10 mg by mouth daily. PRN     . metoprolol succinate (TOPROL-XL) 50 MG 24 hr tablet take 1 tablet by mouth once daily 30 tablet 6  . telmisartan-hydrochlorothiazide (MICARDIS HCT) 80-12.5 MG tablet Take 1 tablet by mouth every morning. 90 tablet 3  . temazepam (RESTORIL) 15 MG capsule Take 1 capsule by mouth at bedtime as needed.    . tiotropium (SPIRIVA) 18 MCG inhalation  capsule Place 18 mcg into inhaler and inhale daily.    . VENTOLIN HFA 108 (90 BASE) MCG/ACT inhaler as needed.     No current facility-administered medications for this visit.    REVIEW OF SYSTEMS:   Constitutional: Denies fevers, chills or abnormal night sweats Eyes: Denies blurriness of vision, double vision or watery eyes Ears, nose, mouth, throat, and face: Denies mucositis or sore throat Respiratory: Mild shortness of breath exertion Cardiovascular: Denies palpitation, chest discomfort or lower extremity swelling Gastrointestinal:  Denies nausea, heartburn or change in bowel habits Skin: Denies abnormal skin rashes Lymphatics: Denies new lymphadenopathy or easy bruising Neurological:Denies numbness, tingling or new weaknesses Behavioral/Psych: Mood is stable, no new changes  Breast:  Denies any palpable lumps or discharge All other systems were reviewed with the patient and are negative.  PHYSICAL EXAMINATION: ECOG PERFORMANCE STATUS: 1 - Symptomatic but completely ambulatory  Filed Vitals:   10/20/15 1313  BP: 131/92  Pulse: 77  Temp: 98.7 F (37.1 C)  Resp: 18   Filed Weights   10/20/15 1313  Weight: 112 lb 8 oz (51.03 kg)    GENERAL:alert, no distress and comfortable SKIN: skin color, texture, turgor are normal, no rashes or significant lesions EYES: normal, conjunctiva are pink and non-injected, sclera clear OROPHARYNX:no exudate, no erythema and lips, buccal mucosa, and tongue normal  NECK: supple, thyroid normal size, non-tender, without nodularity LYMPH:  no palpable lymphadenopathy in the cervical, axillary or inguinal LUNGS: clear to auscultation and percussion with normal breathing effort HEART: regular rate & rhythm and no murmurs and no lower extremity edema ABDOMEN:abdomen soft, non-tender and normal bowel sounds Musculoskeletal:no cyanosis of digits and no clubbing  PSYCH: alert & oriented x 3 with fluent speech NEURO: no focal motor/sensory  deficits  LABORATORY DATA:  I have reviewed the data as listed Lab Results  Component Value Date   WBC 6.3 06/28/2010   HGB 14.3 06/28/2010   HCT 42.4 06/28/2010   MCV 97.0 06/28/2010   PLT 201 06/28/2010   Lab Results  Component Value Date   NA 141 06/30/2010   K 4.2 06/30/2010   CL 111 06/30/2010   CO2 26 06/30/2010    RADIOGRAPHIC STUDIES: I have personally reviewed the radiological reports and agreed with the findings in the report.  ASSESSMENT AND PLAN:  Polycythemia, secondary I discussed the differential diagnosis for elevated hemoglobin between reactive/secondary polycythemia versus Riley polycythemia vera. Patient does have a history of tobacco abuse long-standing along  with some symptoms of COPD with bilateral chest. Most likely the etiology for elevated hemoglobin is a combination of COPD plus obstructive sleep apnea. However in order to be thorough I would like to send for JAK-2 mutation testing as well as erythropoietin level.  Elevated ferritin: 344 I discussed with the patient that it is moderately elevated in the differential diagnosis once again consists of true hemochromatosis versus reactive secondary to inflammation. I would like to send for the complete iron studies including iron TIBC 9 saturation to determine if it is primary or false-positive test due to underlying inflammation.  I wrote down the discussion and explaining her in detail the purpose of these testing. Patient understands this thoroughly and I will see her back in 2 weeks to discuss the results.   All questions were answered. The patient knows to call the clinic with any problems, questions or concerns.    Rulon Eisenmenger, MD 10/20/2015

## 2015-10-20 NOTE — Assessment & Plan Note (Signed)
I discussed the differential diagnosis for elevated hemoglobin between reactive/secondary polycythemia versus Riley polycythemia vera. Patient does have a history of tobacco abuse long-standing along with some symptoms of COPD with bilateral chest. Most likely the etiology for elevated hemoglobin is a combination of COPD plus obstructive sleep apnea. However in order to be thorough I would like to send for JAK-2 mutation testing as well as erythropoietin level.  Elevated ferritin: 344 I discussed with the patient that it is moderately elevated in the differential diagnosis once again consists of true hemochromatosis versus reactive secondary to inflammation. I would like to send for the complete iron studies including iron TIBC 9 saturation to determine if it is primary or false-positive test due to underlying inflammation.  I wrote down the discussion and explaining her in detail the purpose of these testing. Patient understands this thoroughly and I will see her back in 2 weeks to discuss the results.

## 2015-10-21 LAB — ERYTHROPOIETIN: ERYTHROPOIETIN: 12.9 m[IU]/mL (ref 2.6–18.5)

## 2015-11-03 ENCOUNTER — Ambulatory Visit (HOSPITAL_BASED_OUTPATIENT_CLINIC_OR_DEPARTMENT_OTHER): Payer: BC Managed Care – PPO | Admitting: Hematology and Oncology

## 2015-11-03 ENCOUNTER — Encounter: Payer: Self-pay | Admitting: Hematology and Oncology

## 2015-11-03 ENCOUNTER — Other Ambulatory Visit: Payer: Self-pay

## 2015-11-03 VITALS — BP 133/93 | HR 79 | Temp 98.0°F | Resp 18 | Ht 64.0 in | Wt 113.4 lb

## 2015-11-03 DIAGNOSIS — Z720 Tobacco use: Secondary | ICD-10-CM | POA: Diagnosis not present

## 2015-11-03 DIAGNOSIS — D751 Secondary polycythemia: Secondary | ICD-10-CM

## 2015-11-03 NOTE — Progress Notes (Signed)
Patient Care Team: Jani Gravel, MD as PCP - General (Internal Medicine)  DIAGNOSIS: Secondary polycythemia  CHIEF COMPLIANT: Follow-up on blood work done for evaluation of erythrocytosis  INTERVAL HISTORY: [name] is a 65 year old with above-mentioned history of elevated hemoglobin on underwent blood work to evaluate causes of elevated hemoglobin. She is here today to discuss the results of the tests. She continues to smoke cigarettes. Denies any other new complaints or concerns.  REVIEW OF SYSTEMS:   Constitutional: Denies fevers, chills or abnormal weight loss Eyes: Denies blurriness of vision Ears, nose, mouth, throat, and face: Denies mucositis or sore throat Respiratory: Denies cough, dyspnea or wheezes Cardiovascular: Denies palpitation, chest discomfort Gastrointestinal:  Denies nausea, heartburn or change in bowel habits Skin: Denies abnormal skin rashes Lymphatics: Denies new lymphadenopathy or easy bruising Neurological:Denies numbness, tingling or new weaknesses Behavioral/Psych: Mood is stable, no new changes  Extremities: No lower extremity edema  All other systems were reviewed with the patient and are negative.  I have reviewed the past medical history, past surgical history, social history and family history with the patient and they are unchanged from previous note.  ALLERGIES:  is allergic to iodinated diagnostic agents; omnipaque; codeine; erythromycin; and morphine and related.  MEDICATIONS:  Current Outpatient Prescriptions  Medication Sig Dispense Refill  . Acetaminophen (TYLENOL PO) Take by mouth as needed.    . ALPRAZolam (XANAX) 0.25 MG tablet Take 0.25 mg by mouth at bedtime as needed.     Marland Kitchen amLODipine (NORVASC) 5 MG tablet take 1 tablet by mouth once daily 30 tablet 6  . ciprofloxacin (CIPRO) 500 MG tablet Take 500 mg by mouth 2 (two) times daily. for 10 days  0  . CRESTOR 10 MG tablet take 1 tablet by mouth once daily 30 tablet 11  .  diphenhydrAMINE (SOMINEX) 25 MG tablet Take 25 mg by mouth daily.    Marland Kitchen loperamide (IMODIUM) 2 MG capsule Take 2 mg by mouth 4 (four) times daily as needed for diarrhea or loose stools.    Marland Kitchen loratadine (CLARITIN) 10 MG tablet Take 10 mg by mouth daily. PRN     . metoprolol succinate (TOPROL-XL) 50 MG 24 hr tablet take 1 tablet by mouth once daily 30 tablet 6  . telmisartan-hydrochlorothiazide (MICARDIS HCT) 80-12.5 MG tablet Take 1 tablet by mouth every morning. 90 tablet 3  . tiotropium (SPIRIVA) 18 MCG inhalation capsule Place 18 mcg into inhaler and inhale daily.    . VENTOLIN HFA 108 (90 BASE) MCG/ACT inhaler as needed.    . cholestyramine (QUESTRAN) 4 G packet Take 1 packet by mouth daily. Reported on 11/03/2015  1   No current facility-administered medications for this visit.    PHYSICAL EXAMINATION: ECOG PERFORMANCE STATUS: 1 - Symptomatic but completely ambulatory  Filed Vitals:   11/03/15 1512  BP: 133/93  Pulse: 79  Temp: 98 F (36.7 C)  Resp: 18   Filed Weights   11/03/15 1512  Weight: 113 lb 6.4 oz (51.438 kg)    GENERAL:alert, no distress and comfortable SKIN: skin color, texture, turgor are normal, no rashes or significant lesions EYES: normal, Conjunctiva are pink and non-injected, sclera clear OROPHARYNX:no exudate, no erythema and lips, buccal mucosa, and tongue normal  NECK: supple, thyroid normal size, non-tender, without nodularity LYMPH:  no palpable lymphadenopathy in the cervical, axillary or inguinal LUNGS: clear to auscultation and percussion with normal breathing effort HEART: regular rate & rhythm and no murmurs and no lower extremity edema ABDOMEN:abdomen soft,  non-tender and normal bowel sounds MUSCULOSKELETAL:no cyanosis of digits and no clubbing  NEURO: alert & oriented x 3 with fluent speech, no focal motor/sensory deficits EXTREMITIES: No lower extremity edema  LABORATORY DATA:  I have reviewed the data as listed   Chemistry      Component  Value Date/Time   NA 141 06/30/2010 1025   K 4.2 06/30/2010 1025   CL 111 06/30/2010 1025   CO2 26 06/30/2010 1025   BUN 6 06/30/2010 1025   CREATININE 0.51 06/30/2010 1025      Component Value Date/Time   CALCIUM 8.9 06/30/2010 1025   ALKPHOS 61 06/28/2010 2340   AST 24 06/28/2010 2340   ALT 23 06/28/2010 2340   BILITOT 0.5 06/28/2010 2340       Lab Results  Component Value Date   WBC 4.9 10/20/2015   HGB 15.6 10/20/2015   HCT 46.4 10/20/2015   MCV 98.9 10/20/2015   PLT 205 10/20/2015   NEUTROABS 2.4 10/20/2015     ASSESSMENT & PLAN:  Secondary polycythemia: Most likely related to tobacco abuse/respiratory issues. JAK-2 mutation analysis: Normal Erythropoietin level: Normal 12.9 Hemoglobin: 15.6 Iron studies: Normal 36% Iron saturation Ferritin 776: Acute phase reactant related to underlying inflammation.  Since this is secondary polycythemia, the goals of treatment would be to help her quit smoking and improve her oxygenation. She does not need phlebotomy on any other hemoglobin lowering therapies. Patient was asked to return back to see [place] if there has been any change in her health status or hemoglobin levels. We are happy to see the patient on an as-needed basis.  No orders of the defined types were placed in this encounter.   The patient has a good understanding of the overall plan. she agrees with it. she will call with any problems that may develop before the next visit here.   Rulon Eisenmenger, MD 11/03/2015

## 2015-11-11 ENCOUNTER — Other Ambulatory Visit: Payer: Self-pay | Admitting: Internal Medicine

## 2015-11-11 DIAGNOSIS — R3915 Urgency of urination: Secondary | ICD-10-CM

## 2015-11-11 DIAGNOSIS — M545 Low back pain: Secondary | ICD-10-CM

## 2015-11-12 ENCOUNTER — Other Ambulatory Visit: Payer: BC Managed Care – PPO

## 2015-12-15 ENCOUNTER — Encounter: Payer: Self-pay | Admitting: Women's Health

## 2015-12-17 ENCOUNTER — Other Ambulatory Visit: Payer: Self-pay | Admitting: Radiology

## 2015-12-22 ENCOUNTER — Encounter: Payer: Self-pay | Admitting: Women's Health

## 2015-12-23 ENCOUNTER — Other Ambulatory Visit: Payer: Self-pay | Admitting: Internal Medicine

## 2015-12-23 NOTE — Telephone Encounter (Signed)
Rx request sent to pharmacy.  

## 2015-12-24 ENCOUNTER — Encounter: Payer: Self-pay | Admitting: Women's Health

## 2016-01-02 ENCOUNTER — Encounter: Payer: Self-pay | Admitting: Women's Health

## 2016-02-01 ENCOUNTER — Other Ambulatory Visit: Payer: Self-pay | Admitting: Internal Medicine

## 2016-03-25 ENCOUNTER — Encounter: Payer: Self-pay | Admitting: Internal Medicine

## 2016-03-25 ENCOUNTER — Ambulatory Visit (INDEPENDENT_AMBULATORY_CARE_PROVIDER_SITE_OTHER): Payer: BC Managed Care – PPO | Admitting: Internal Medicine

## 2016-03-25 VITALS — BP 130/86 | HR 74 | Ht 64.0 in | Wt 112.8 lb

## 2016-03-25 DIAGNOSIS — R9431 Abnormal electrocardiogram [ECG] [EKG]: Secondary | ICD-10-CM

## 2016-03-25 DIAGNOSIS — Z720 Tobacco use: Secondary | ICD-10-CM | POA: Diagnosis not present

## 2016-03-25 DIAGNOSIS — I10 Essential (primary) hypertension: Secondary | ICD-10-CM

## 2016-03-25 DIAGNOSIS — R0602 Shortness of breath: Secondary | ICD-10-CM | POA: Diagnosis not present

## 2016-03-25 DIAGNOSIS — E785 Hyperlipidemia, unspecified: Secondary | ICD-10-CM | POA: Diagnosis not present

## 2016-03-25 NOTE — Patient Instructions (Signed)
Medication Instructions:   NO CHANGE  Testing/Procedures:  Your physician has requested that you have a lexiscan myoview. For further information please visit [email or web address]. Please follow instruction sheet, as given.    Follow-Up:  Your physician wants you to follow-up in: [place] will receive a reminder letter in the mail two months in advance. If you don't receive a letter, please call our office to schedule the follow-up appointment.   If you need a refill on your cardiac medications before your next appointment, please call your pharmacy.

## 2016-03-26 DIAGNOSIS — R0602 Shortness of breath: Secondary | ICD-10-CM | POA: Insufficient documentation

## 2016-03-26 NOTE — Progress Notes (Signed)
OFFICE NOTE  Chief Complaint:  Routine follow-up, no complaints  Primary Care Physician: Jani Gravel, MD  HPI:  [name] is a 65 year old female I have been following for anxiety as well as difficult to control hypertension. Blood pressure has been much better controlled on her current regimen of Micardis/HCTZ as well as the metoprolol. She is also on Crestor for dyslipidemia and has had a well-controlled lipid profile. Recently, she has been concerned about fast intestinal transits, diarrhea, and/or stomach gurgling. She takes cholestyramine for this and was started on Dexilant for reflux-type symptoms. Although this has been helpful, she continues to have some of those complaints, but no other active cardiac complaints. Unfortunately, she continues to smoke. We discussed smoking cessation today. However, she is not quite ready to quit due to stress in her job. Fortunately her boss quit in the past year and her stress is improved significantly. She tells her she has 2 more years left and then she can retire with attention. Overall she thinks she is doing very well. The only other new issue is that she broke a small bone in the left foot but that has healed up very quickly.  [name] returns today for followup. She reports having some upper midepigastric pain but has not been taking her PPI regularly. Just reports not taking her cholesterol medication regularly. She doesn't be taken her blood pressure medicines and her blood pressure is well-controlled today.  I saw [name] back today in follow-up. She tells me that she is only a few days away from retirement. Unfortunately there'll be a 3-4 week delay in insurance coverage before she can start with her new state insurance. She was told that she could get insurance coverage from [facility] however the cost would be more than $800. From a medical standpoint she seems to be doing well and is interested in quitting cigarettes. She got a  prescription for Chantix but has not yet started. Blood pressure is well-controlled. She has had good cholesterol control on Crestor.  03/26/2016  [name] returns today for follow-up. Blood pressure appears to be well-controlled. She denies any chest pain, but has had some worsening fatigue and dyspnea. She says she gets short of breath walking up stairs and has had recent decreased exercise tolerance.   PMHx:  Past Medical History:  Diagnosis Date  . Active smoker   . Anxiety   . Cervical dysplasia   . Dyslipidemia   . Endometriosis   . History of nuclear stress test 07/07/2010   dipyridamole; low risk, post-stress EF 65%  . Hypertension   . Migraine   . Osteopenia    08/2006 and 08/2008 Dexa Scans w Isaiah Blakes     Past Surgical History:  Procedure Laterality Date  . BACK SURGERY  2002   RUPTURED DISC   . CARDIAC CATHETERIZATION  11/20/2002   patent coronary arteries   . CHOLECYSTECTOMY  2006  . COLPOSCOPY    . GYNECOLOGIC CRYOSURGERY    . NOSE SURGERY     X2  . OOPHORECTOMY     BSO  . SHOULDER SURGERY     ROTATOR CUFF X 2  . THROAT SURGERY     REMOVAL OF TUMOR  . VAGINAL HYSTERECTOMY  2000   WITH BSO    FAMHx:  Family History  Problem Relation Age of Onset  . Hypertension Mother   . Cancer Mother     LIVER AND LUNG  . Hypertension Brother   . Heart disease  Brother   . Hypertension Maternal Grandfather   . Heart disease Maternal Grandfather   . Heart attack Maternal Grandfather     SOCHx:   reports that she has been smoking Cigarettes.  She has a 42.00 pack-year smoking history. She has never used smokeless tobacco. She reports that she drinks about 3.6 - 4.8 oz of alcohol per week . She reports that she does not use drugs.  ALLERGIES:  Allergies  Allergen Reactions  . Iodinated Diagnostic Agents Itching    itching but no hives just after iv contrast injection w/ 13 hr prep, future contrast not advised unless absolutely necessary, 13 hr prep would still  be advised.//a.calhoun  . Ioxaglate Itching    itching but no hives just after iv contrast injection w/ 13 hr prep, future contrast not advised unless absolutely necessary, 13 hr prep would still be advised.//a.calhoun  . Omnipaque [Iohexol] Anaphylaxis  . Buprenorphine Hcl Nausea And Vomiting  . Codeine Nausea And Vomiting  . Erythromycin   . Morphine And Related Nausea And Vomiting    ROS: A comprehensive review of systems was negative.  HOME MEDS: Current Outpatient Prescriptions  Medication Sig Dispense Refill  . Acetaminophen (TYLENOL PO) Take by mouth as needed.    . ALPRAZolam (XANAX) 0.25 MG tablet Take 0.25 mg by mouth at bedtime as needed.     Marland Kitchen amLODipine (NORVASC) 5 MG tablet take 1 tablet by mouth once daily 30 tablet 11  . cholestyramine (QUESTRAN) 4 G packet Take 1 packet by mouth daily. Reported on 11/03/2015  1  . diphenhydrAMINE (SOMINEX) 25 MG tablet Take 25 mg by mouth daily.    Marland Kitchen loperamide (IMODIUM) 2 MG capsule Take 2 mg by mouth 4 (four) times daily as needed for diarrhea or loose stools.    Marland Kitchen loratadine (CLARITIN) 10 MG tablet Take 10 mg by mouth daily. PRN     . metoprolol succinate (TOPROL-XL) 50 MG 24 hr tablet take 1 tablet by mouth once daily 30 tablet 11  . rosuvastatin (CRESTOR) 10 MG tablet take 1 tablet by mouth once daily 30 tablet 11  . telmisartan-hydrochlorothiazide (MICARDIS HCT) 80-12.5 MG tablet take 1 tablet by mouth every morning 90 tablet 0  . tiotropium (SPIRIVA) 18 MCG inhalation capsule Place 18 mcg into inhaler and inhale daily.    . VENTOLIN HFA 108 (90 BASE) MCG/ACT inhaler as needed.     No current facility-administered medications for this visit.     LABS/IMAGING: No results found for this or any previous visit (from the past 48 hour(s)). No results found.  VITALS: BP 130/86   Pulse 74   Ht '5\' 4"'$  (1.626 m)   Wt 112 lb 12.8 oz (51.2 kg)   BMI 19.36 kg/m   EXAM: General appearance: alert and no distress Neck: no  adenopathy, no carotid bruit, no JVD, supple, symmetrical, trachea midline and thyroid not enlarged, symmetric, no tenderness/mass/nodules Lungs: clear to auscultation bilaterally Heart: regular rate and rhythm, S1, S2 normal, no murmur, click, rub or gallop Abdomen: soft, non-tender; bowel sounds normal; no masses,  no organomegaly Extremities: extremities normal, atraumatic, no cyanosis or edema Pulses: 2+ and symmetric Skin: Skin color, texture, turgor normal. No rashes or lesions Neurologic: Grossly normal  EKG: Normal sinus rhythm at 74, T-wave abnormalities inferiorly and anteriorly suggestive of ischemia  ASSESSMENT: 1. Progressive dyspnea and fatigue with ischemic EKG changes 2. Hypertension-controlled 3. Hyperlipidemia- on Crestor 4. Tobacco abuse  PLAN: 1.   [name] has had some  progressive dyspnea and fatigue and is noted to have some T-wave changes on her EKG concerning for ischemia. It's been more than 5 years since her last stress test and I like for her to undergo another exercise Myoview. We again talked about smoking cessation. Blood pressure appears well controlled. She is on cholesterol medication. I'll contact her with the results of her stress test and otherwise see her back annually or sooner if it's abnormal.  Pixie Casino, MD, [facility] Attending Cardiologist [place] 03/26/2016, 4:28 PM

## 2016-04-02 ENCOUNTER — Telehealth (HOSPITAL_COMMUNITY): Payer: Self-pay

## 2016-04-02 NOTE — Telephone Encounter (Signed)
Encounter complete. 

## 2016-04-07 ENCOUNTER — Ambulatory Visit (HOSPITAL_COMMUNITY)
Admission: RE | Admit: 2016-04-07 | Discharge: 2016-04-07 | Disposition: A | Discharge status: Home / Self Care | Payer: BC Managed Care – PPO | Source: Ambulatory Visit | Attending: Cardiovascular Disease | Admitting: Cardiovascular Disease

## 2016-04-07 DIAGNOSIS — R0602 Shortness of breath: Secondary | ICD-10-CM | POA: Insufficient documentation

## 2016-04-07 LAB — MYOCARDIAL PERFUSION IMAGING
CHL CUP NUCLEAR SDS: 2
CHL CUP NUCLEAR SRS: 5
CSEPPHR: 103 {beats}/min
LV dias vol: 52 mL (ref 46–106)
LV sys vol: 15 mL
NUC STRESS TID: 1.21
Rest HR: 85 {beats}/min
SSS: 7

## 2016-04-07 MED ORDER — TECHNETIUM TC 99M TETROFOSMIN IV KIT
32.0000 | PACK | Freq: Once | INTRAVENOUS | Status: AC | PRN
  Administered 2016-04-07: 32 via INTRAVENOUS
  Filled 2016-04-07: qty 32

## 2016-04-07 MED ORDER — AMINOPHYLLINE 25 MG/ML IV SOLN
75.0000 mg | Freq: Once | INTRAVENOUS | Status: AC
  Administered 2016-04-07: 75 mg via INTRAVENOUS

## 2016-04-07 MED ORDER — TECHNETIUM TC 99M TETROFOSMIN IV KIT
10.2000 | PACK | Freq: Once | INTRAVENOUS | Status: AC | PRN
  Administered 2016-04-07: 10.2 via INTRAVENOUS
  Filled 2016-04-07: qty 11

## 2016-04-07 MED ORDER — REGADENOSON 0.4 MG/5ML IV SOLN
0.4000 mg | Freq: Once | INTRAVENOUS | Status: AC
  Administered 2016-04-07: 0.4 mg via INTRAVENOUS

## 2016-05-08 ENCOUNTER — Other Ambulatory Visit: Payer: Self-pay | Admitting: Internal Medicine

## 2016-05-10 NOTE — Telephone Encounter (Signed)
Rx(s) sent to pharmacy electronically.  

## 2016-06-24 ENCOUNTER — Encounter: Payer: Self-pay | Admitting: Women's Health

## 2016-07-02 ENCOUNTER — Encounter: Payer: Self-pay | Admitting: Women's Health

## 2016-07-13 ENCOUNTER — Ambulatory Visit (INDEPENDENT_AMBULATORY_CARE_PROVIDER_SITE_OTHER): Payer: BC Managed Care – PPO | Admitting: Women's Health

## 2016-07-13 ENCOUNTER — Encounter: Payer: Self-pay | Admitting: Women's Health

## 2016-07-13 VITALS — BP 121/81 | Ht 64.0 in | Wt 113.6 lb

## 2016-07-13 DIAGNOSIS — F5101 Primary insomnia: Secondary | ICD-10-CM | POA: Diagnosis not present

## 2016-07-13 DIAGNOSIS — Z01419 Encounter for gynecological examination (general) (routine) without abnormal findings: Secondary | ICD-10-CM | POA: Diagnosis not present

## 2016-07-13 MED ORDER — ESZOPICLONE 1 MG PO TABS
1.0000 mg | ORAL_TABLET | Freq: Every evening | ORAL | 3 refills | Status: DC | PRN
Start: 2016-07-13 — End: 2017-03-25

## 2016-07-13 MED ORDER — ALPRAZOLAM 0.25 MG PO TABS: 0.2500 mg | ORAL_TABLET | Freq: Every evening | ORAL | 1 refills | Status: DC | PRN

## 2016-07-13 NOTE — Progress Notes (Signed)
RYAN PALERMO Oct 03, 1950 [id number]    History:    Presents for annual exam.   2000 TVH with BSO for endometriosis on no HRT. Normal Pap history, negative breast biopsy 12/2015 follow-up mammogram this month normal. Osteopenia managed by primary care had been on Reclast for one year has now been on Prolia for 4 years at primary care. Negative colonoscopy 2011. Zostavax 2014. Not sexually active in years. Smoker. Having a difficult time with sleep especially  past year after retiring from working at [employer] for many years. No relief with numerous over-the-counter products for sleep.  Past medical history, past surgical history, family history and social history were all reviewed and documented in the EPIC chart. Daughter has 4 children, son has 2 children, daughter is 2-1/2 has had several open heart surgeries for defect. Doing better.  ROS:  A ROS was performed and pertinent positives and negatives are included.  Exam:  Vitals:   07/13/16 1500  BP: 121/81  Weight: 113 lb 9.6 oz (51.5 kg)  Height: '5\' 4"'$  (1.626 m)   Body mass index is 19.5 kg/m.   General appearance:  Normal Thyroid:  Symmetrical, normal in size, without palpable masses or nodularity. Respiratory  Auscultation:  Clear without wheezing or rhonchi Cardiovascular  Auscultation:  Regular rate, without rubs, murmurs or gallops  Edema/varicosities:  Not grossly evident Abdominal  Soft,nontender, without masses, guarding or rebound.  Liver/spleen:  No organomegaly noted  Hernia:  None appreciated  Skin  Inspection:  Grossly normal   Breasts: Examined lying and sitting.     Right: Without masses, retractions, discharge or axillary adenopathy.     Left: Without masses, retractions, discharge or axillary adenopathy. Gentitourinary   Inguinal/mons:  Normal without inguinal adenopathy  External genitalia:  Normal  BUS/Urethra/Skene's glands:  Normal  Vagina:  Normal  Cervix:  And uterus  absent  Adnexa/parametria:     Rt: Without masses or tenderness.   Lt: Without masses or tenderness.  Anus and perineum: Normal  Digital rectal exam: Normal sphincter tone without palpated masses or tenderness  Assessment/Plan:  66 y.o. D WF G2 P2 for annual exam, biggest problem is no sleep.    2000 TVH with BSO for endometriosis on no HRT Insomnia Osteopenia on Prolia per primary care Smoker Hypertension, asthma, hypercholesterolemia-primary care manages labs and meds  Plan: Long discussion on insomnia/sleep hygiene. Will try Lunesta 1 mg at bedtime for 1 week. Prescription given, reviewed importance of short-term use, addictive. Reviewed importance of bedtime and awakening times, currently sleeping most of the day due to poor sleep at night. Instructed to call if no relief. SBE's, continue annual screening 3-D mammogram. Strongly encouraged no or decreasing smoking. Aware of hazards, tips to quit reviewed. Home safety, fall prevention and importance of weightbearing exercise reviewed, encourage daily brisk walk. Pneumonia vaccine at 89. Refill of Xanax 0.25 when necessary uses rarely, addictive properties reviewed.    Huel Cote WHNP, 5:10 PM 07/13/2016

## 2016-07-13 NOTE — Patient Instructions (Addendum)
Health Maintenance for Postmenopausal Women Menopause is a normal process in which your reproductive ability comes to an end. This process happens gradually over a span of months to years, usually between the ages of 33 and 38. Menopause is complete when you have missed 12 consecutive menstrual periods. It is important to talk with your health care provider about some of the most common conditions that affect postmenopausal women, such as heart disease, cancer, and bone loss (osteoporosis). Adopting a healthy lifestyle and getting preventive care can help to promote your health and wellness. Those actions can also lower your chances of developing some of these common conditions. What should I know about menopause? During menopause, you may experience a number of symptoms, such as:  Moderate-to-severe hot flashes.  Night sweats.  Decrease in sex drive.  Mood swings.  Headaches.  Tiredness.  Irritability.  Memory problems.  Insomnia. Choosing to treat or not to treat menopausal changes is an individual decision that you make with your health care provider. What should I know about hormone replacement therapy and supplements? Hormone therapy products are effective for treating symptoms that are associated with menopause, such as hot flashes and night sweats. Hormone replacement carries certain risks, especially as you become older. If you are thinking about using estrogen or estrogen with progestin treatments, discuss the benefits and risks with your health care provider. What should I know about heart disease and stroke? Heart disease, heart attack, and stroke become more likely as you age. This may be due, in part, to the hormonal changes that your body experiences during menopause. These can affect how your body processes dietary fats, triglycerides, and cholesterol. Heart attack and stroke are both medical emergencies. There are many things that you can do to help prevent heart disease  and stroke:  Have your blood pressure checked at least every 1-2 years. High blood pressure causes heart disease and increases the risk of stroke.  If you are 48-61 years old, ask your health care provider if you should take aspirin to prevent a heart attack or a stroke.  Do not use any tobacco products, including cigarettes, chewing tobacco, or electronic cigarettes. If you need help quitting, ask your health care provider.  It is important to eat a healthy diet and maintain a healthy weight.  Be sure to include plenty of vegetables, fruits, low-fat dairy products, and lean protein.  Avoid eating foods that are high in solid fats, added sugars, or salt (sodium).  Get regular exercise. This is one of the most important things that you can do for your health.  Try to exercise for at least 150 minutes each week. The type of exercise that you do should increase your heart rate and make you sweat. This is known as moderate-intensity exercise.  Try to do strengthening exercises at least twice each week. Do these in addition to the moderate-intensity exercise.  Know your numbers.Ask your health care provider to check your cholesterol and your blood glucose. Continue to have your blood tested as directed by your health care provider. What should I know about cancer screening? There are several types of cancer. Take the following steps to reduce your risk and to catch any cancer development as early as possible. Breast Cancer  Practice breast self-awareness.  This means understanding how your breasts normally appear and feel.  It also means doing regular breast self-exams. Let your health care provider know about any changes, no matter how small.  If you are 40 or older,  have a clinician do a breast exam (clinical breast exam or CBE) every year. Depending on your age, family history, and medical history, it may be recommended that you also have a yearly breast X-ray (mammogram).  If you  have a family history of breast cancer, talk with your health care provider about genetic screening.  If you are at high risk for breast cancer, talk with your health care provider about having an MRI and a mammogram every year.  Breast cancer (BRCA) gene test is recommended for women who have family members with BRCA-related cancers. Results of the assessment will determine the need for genetic counseling and BRCA1 and for BRCA2 testing. BRCA-related cancers include these types:  Breast. This occurs in males or females.  Ovarian.  Tubal. This may also be called fallopian tube cancer.  Cancer of the abdominal or pelvic lining (peritoneal cancer).  Prostate.  Pancreatic. Cervical, Uterine, and Ovarian Cancer  Your health care provider may recommend that you be screened regularly for cancer of the pelvic organs. These include your ovaries, uterus, and vagina. This screening involves a pelvic exam, which includes checking for microscopic changes to the surface of your cervix (Pap test).  For women ages 21-65, health care providers may recommend a pelvic exam and a Pap test every three years. For women ages 23-65, they may recommend the Pap test and pelvic exam, combined with testing for human papilloma virus (HPV), every five years. Some types of HPV increase your risk of cervical cancer. Testing for HPV may also be done on women of any age who have unclear Pap test results.  Other health care providers may not recommend any screening for nonpregnant women who are considered low risk for pelvic cancer and have no symptoms. Ask your health care provider if a screening pelvic exam is right for you.  If you have had past treatment for cervical cancer or a condition that could lead to cancer, you need Pap tests and screening for cancer for at least 20 years after your treatment. If Pap tests have been discontinued for you, your risk factors (such as having a new sexual partner) need to be reassessed  to determine if you should start having screenings again. Some women have medical problems that increase the chance of getting cervical cancer. In these cases, your health care provider may recommend that you have screening and Pap tests more often.  If you have a family history of uterine cancer or ovarian cancer, talk with your health care provider about genetic screening.  If you have vaginal bleeding after reaching menopause, tell your health care provider.  There are currently no reliable tests available to screen for ovarian cancer. Lung Cancer  Lung cancer screening is recommended for adults 99-83 years old who are at high risk for lung cancer because of a history of smoking. A yearly low-dose CT scan of the lungs is recommended if you:  Currently smoke.  Have a history of at least 30 pack-years of smoking and you currently smoke or have quit within the past 15 years. A pack-year is smoking an average of one pack of cigarettes per day for one year. Yearly screening should:  Continue until it has been 15 years since you quit.  Stop if you develop a health problem that would prevent you from having lung cancer treatment. Colorectal Cancer  This type of cancer can be detected and can often be prevented.  Routine colorectal cancer screening usually begins at age 72 and continues  through age 75.  If you have risk factors for colon cancer, your health care provider may recommend that you be screened at an earlier age.  If you have a family history of colorectal cancer, talk with your health care provider about genetic screening.  Your health care provider may also recommend using home test kits to check for hidden blood in your stool.  A small camera at the end of a tube can be used to examine your colon directly (sigmoidoscopy or colonoscopy). This is done to check for the earliest forms of colorectal cancer.  Direct examination of the colon should be repeated every 5-10 years until  age 75. However, if early forms of precancerous polyps or small growths are found or if you have a family history or genetic risk for colorectal cancer, you may need to be screened more often. Skin Cancer  Check your skin from head to toe regularly.  Monitor any moles. Be sure to tell your health care provider:  About any new moles or changes in moles, especially if there is a change in a mole's shape or color.  If you have a mole that is larger than the size of a pencil eraser.  If any of your family members has a history of skin cancer, especially at a young age, talk with your health care provider about genetic screening.  Always use sunscreen. Apply sunscreen liberally and repeatedly throughout the day.  Whenever you are outside, protect yourself by wearing long sleeves, pants, a wide-brimmed hat, and sunglasses. What should I know about osteoporosis? Osteoporosis is a condition in which bone destruction happens more quickly than new bone creation. After menopause, you may be at an increased risk for osteoporosis. To help prevent osteoporosis or the bone fractures that can happen because of osteoporosis, the following is recommended:  If you are 19-50 years old, get at least 1,000 mg of calcium and at least 600 mg of vitamin D per day.  If you are older than age 50 but younger than age 70, get at least 1,200 mg of calcium and at least 600 mg of vitamin D per day.  If you are older than age 70, get at least 1,200 mg of calcium and at least 800 mg of vitamin D per day. Smoking and excessive alcohol intake increase the risk of osteoporosis. Eat foods that are rich in calcium and vitamin D, and do weight-bearing exercises several times each week as directed by your health care provider. What should I know about how menopause affects my mental health? Depression may occur at any age, but it is more common as you become older. Common symptoms of depression include:  Low or sad  mood.  Changes in sleep patterns.  Changes in appetite or eating patterns.  Feeling an overall lack of motivation or enjoyment of activities that you previously enjoyed.  Frequent crying spells. Talk with your health care provider if you think that you are experiencing depression. What should I know about immunizations? It is important that you get and maintain your immunizations. These include:  Tetanus, diphtheria, and pertussis (Tdap) booster vaccine.  Influenza every year before the flu season begins.  Pneumonia vaccine.  Shingles vaccine. Your health care provider may also recommend other immunizations. This information is not intended to replace advice given to you by your health care provider. Make sure you discuss any questions you have with your health care provider. Document Released: 06/04/2005 Document Revised: 10/31/2015 Document Reviewed: 01/14/2015 Elsevier Interactive Patient   Education  2017 [employer]. Insomnia Insomnia is a sleep disorder that makes it difficult to fall asleep or to stay asleep. Insomnia can cause tiredness (fatigue), low energy, difficulty concentrating, mood swings, and poor performance at work or school. There are three different ways to classify insomnia:  Difficulty falling asleep.  Difficulty staying asleep.  Waking up too early in the morning. Any type of insomnia can be long-term (chronic) or short-term (acute). Both are common. Short-term insomnia usually lasts for three months or less. Chronic insomnia occurs at least three times a week for longer than three months. What are the causes? Insomnia may be caused by another condition, situation, or substance, such as:  Anxiety.  Certain medicines.  Gastroesophageal reflux disease (GERD) or other gastrointestinal conditions.  Asthma or other breathing conditions.  Restless legs syndrome, sleep apnea, or other sleep disorders.  Chronic pain.  Menopause. This may include hot  flashes.  Stroke.  Abuse of alcohol, tobacco, or illegal drugs.  Depression.  Caffeine.  Neurological disorders, such as Alzheimer disease.  An overactive thyroid (hyperthyroidism). The cause of insomnia may not be known. What increases the risk? Risk factors for insomnia include:  Gender. Women are more commonly affected than men.  Age. Insomnia is more common as you get older.  Stress. This may involve your professional or personal life.  Income. Insomnia is more common in people with lower income.  Lack of exercise.  Irregular work schedule or night shifts.  Traveling between different time zones. What are the signs or symptoms? If you have insomnia, trouble falling asleep or trouble staying asleep is the main symptom. This may lead to other symptoms, such as:  Feeling fatigued.  Feeling nervous about going to sleep.  Not feeling rested in the morning.  Having trouble concentrating.  Feeling irritable, anxious, or depressed. How is this treated? Treatment for insomnia depends on the cause. If your insomnia is caused by an underlying condition, treatment will focus on addressing the condition. Treatment may also include:  Medicines to help you sleep.  Counseling or therapy.  Lifestyle adjustments. Follow these instructions at home:  Take medicines only as directed by your health care provider.  Keep regular sleeping and waking hours. Avoid naps.  Keep a sleep diary to help you and your health care provider figure out what could be causing your insomnia. Include:  When you sleep.  When you wake up during the night.  How well you sleep.  How rested you feel the next day.  Any side effects of medicines you are taking.  What you eat and drink.  Make your bedroom a comfortable place where it is easy to fall asleep:  Put up shades or special blackout curtains to block light from outside.  Use a white noise machine to block noise.  Keep the  temperature cool.  Exercise regularly as directed by your health care provider. Avoid exercising right before bedtime.  Use relaxation techniques to manage stress. Ask your health care provider to suggest some techniques that may work well for you. These may include:  Breathing exercises.  Routines to release muscle tension.  Visualizing peaceful scenes.  Cut back on alcohol, caffeinated beverages, and cigarettes, especially close to bedtime. These can disrupt your sleep.  Do not overeat or eat spicy foods right before bedtime. This can lead to digestive discomfort that can make it hard for you to sleep.  Limit screen use before bedtime. This includes:  Watching TV.  Using your smartphone, tablet,  and computer.  Stick to a routine. This can help you fall asleep faster. Try to do a quiet activity, brush your teeth, and go to bed at the same time each night.  Get out of bed if you are still awake after 15 minutes of trying to sleep. Keep the lights down, but try reading or doing a quiet activity. When you feel sleepy, go back to bed.  Make sure that you drive carefully. Avoid driving if you feel very sleepy.  Keep all follow-up appointments as directed by your health care provider. This is important. Contact a health care provider if:  You are tired throughout the day or have trouble in your daily routine due to sleepiness.  You continue to have sleep problems or your sleep problems get worse. Get help right away if:  You have serious thoughts about hurting yourself or someone else. This information is not intended to replace advice given to you by your health care provider. Make sure you discuss any questions you have with your health care provider. Document Released: 04/09/2000 Document Revised: 09/12/2015 Document Reviewed: 01/11/2014 Elsevier Interactive Patient Education  2017 [employer]  Clostridium Difficile Infection  Clostridium difficile (C. difficile or C. diff)  infection causes inflammation of the large intestine (colon). This condition can result in damage to the lining of your colon and may lead to another condition called colitis. This infection can be passed from person to person (is contagious). Follow these instructions at home: Eating and drinking   Drink enough fluid to keep your pee (urine) clear or pale yellow.  Avoid drinking:  Milk.  Caffeine.  Alcohol.  Follow exact instructions from your doctor about how to get enough fluid in your body (rehydrate).  Eat small meals often instead of large meals. Medicines   Take your antibiotic medicine as told by your doctor. Do not stop taking the antibiotic even if you start to feel better unless your doctor told you to do that.  Take over-the-counter and prescription medicines only as told by your doctor.  Do not use medicines to help with watery poop (diarrhea). General instructions   Wash your hands fully before you prepare food and after you use the bathroom. Make sure people who live with you also wash their  hands often.  Clean the surfaces that you touch. Use a product that contains chlorine bleach.  Keep all follow-up visits as told by your doctor. This is important. Contact a doctor if:  Your symptoms do not get better with treatment.  Your symptoms get worse with treatment.  Your symptoms go away and then come back.  You have a fever.  You have new symptoms. Get help right away if:  You have more pain or tenderness in your belly (abdomen).  Your poop (stool) is mostly bloody.  Your poop looks dark black and tarry.  You cannot eat or drink without throwing up (vomiting).  You have signs of dehydration, such as:  Dark pee, very little pee, or no pee.  Cracked lips.  Not making tears when you cry.  Dry mouth.  Sunken eyes.  Feeling sleepy.  Feeling weak.  Feeling dizzy. This information is not intended to replace advice given to you by your  health care provider. Make sure you discuss any questions you have with your health care provider. Document Released: 02/07/2009 Document Revised: 09/18/2015 Document Reviewed: 10/14/2014 Elsevier Interactive Patient Education  2017 [employer].

## 2016-08-11 ENCOUNTER — Telehealth: Payer: Self-pay | Admitting: *Deleted

## 2016-08-11 MED ORDER — ESZOPICLONE 2 MG PO TABS: 2.0000 mg | ORAL_TABLET | Freq: Every evening | ORAL | 0 refills | Status: DC | PRN

## 2016-08-11 NOTE — Telephone Encounter (Signed)
Pt was prescribed lunesta 1 mg at [facility] 07/13/16 states medication is not helping with sleep. Pt said she can fall asleep then wake back up and not able to go back to sleep. Please advise

## 2016-08-11 NOTE — Telephone Encounter (Signed)
Okay, try Lunesta 2 mg, please call in #[phone] per patient. Review with patient importance of sleep hygiene going to bed at same time, getting up same time even on the weekends, no napping, avoid sleeping in on the weekends. 1 mg helps you to fall asleep , 2 mg sometimes will help with both falling asleep and staying asleep.

## 2016-08-11 NOTE — Telephone Encounter (Signed)
Pt informed, Rx called in.

## 2016-10-07 ENCOUNTER — Other Ambulatory Visit: Payer: Self-pay | Admitting: Internal Medicine

## 2016-10-07 DIAGNOSIS — R1011 Right upper quadrant pain: Secondary | ICD-10-CM

## 2016-10-11 ENCOUNTER — Ambulatory Visit
Admission: RE | Admit: 2016-10-11 | Discharge: 2016-10-11 | Disposition: A | Discharge status: Home / Self Care | Payer: Medicare Other | Source: Ambulatory Visit | Attending: Internal Medicine | Admitting: Internal Medicine

## 2016-10-11 DIAGNOSIS — R1011 Right upper quadrant pain: Secondary | ICD-10-CM

## 2016-11-10 ENCOUNTER — Other Ambulatory Visit: Payer: Self-pay | Admitting: Internal Medicine

## 2016-11-10 DIAGNOSIS — R945 Abnormal results of liver function studies: Secondary | ICD-10-CM

## 2016-11-15 ENCOUNTER — Other Ambulatory Visit: Payer: Self-pay | Admitting: Gastroenterology

## 2016-11-15 DIAGNOSIS — R935 Abnormal findings on diagnostic imaging of other abdominal regions, including retroperitoneum: Secondary | ICD-10-CM

## 2016-11-15 NOTE — Progress Notes (Signed)
Dorien Bessent MD 

## 2016-11-26 ENCOUNTER — Ambulatory Visit
Admission: RE | Admit: 2016-11-26 | Discharge: 2016-11-26 | Disposition: A | Discharge status: Home / Self Care | Payer: Medicare Other | Source: Ambulatory Visit | Attending: Gastroenterology | Admitting: Gastroenterology

## 2016-11-26 DIAGNOSIS — R935 Abnormal findings on diagnostic imaging of other abdominal regions, including retroperitoneum: Secondary | ICD-10-CM

## 2016-11-26 MED ORDER — GADOBENATE DIMEGLUMINE 529 MG/ML IV SOLN
10.0000 mL | Freq: Once | INTRAVENOUS | Status: AC | PRN
  Administered 2016-11-26: 10 mL via INTRAVENOUS

## 2017-01-11 ENCOUNTER — Other Ambulatory Visit: Payer: Self-pay | Admitting: Internal Medicine

## 2017-01-11 NOTE — Telephone Encounter (Signed)
Rx(s) sent to pharmacy electronically.  

## 2017-01-13 ENCOUNTER — Telehealth: Payer: Self-pay | Admitting: Internal Medicine

## 2017-01-13 NOTE — Telephone Encounter (Signed)
Called patient and LVM for her to call back to schedule her yearly followup with Dr. Debara Pickett.

## 2017-02-06 ENCOUNTER — Other Ambulatory Visit: Payer: Self-pay | Admitting: Internal Medicine

## 2017-03-25 ENCOUNTER — Ambulatory Visit: Payer: Medicare Other | Admitting: Internal Medicine

## 2017-03-25 ENCOUNTER — Encounter: Payer: Self-pay | Admitting: Internal Medicine

## 2017-03-25 VITALS — BP 122/72 | HR 79 | Ht 64.0 in | Wt 113.0 lb

## 2017-03-25 DIAGNOSIS — R0602 Shortness of breath: Secondary | ICD-10-CM | POA: Diagnosis not present

## 2017-03-25 DIAGNOSIS — Z720 Tobacco use: Secondary | ICD-10-CM

## 2017-03-25 DIAGNOSIS — E785 Hyperlipidemia, unspecified: Secondary | ICD-10-CM

## 2017-03-25 DIAGNOSIS — I10 Essential (primary) hypertension: Secondary | ICD-10-CM

## 2017-03-25 NOTE — Patient Instructions (Signed)
Your physician wants you to follow-up in: 12 months with Dr. Hilty. You will receive a reminder letter in the mail two months in advance. If you don't receive a letter, please call our office to schedule the follow-up appointment.  

## 2017-03-25 NOTE — Progress Notes (Signed)
OFFICE NOTE  Chief Complaint:  No complaints  Primary Care Physician: Jani Gravel, MD  HPI:  [name] is a 66 year old female I have been following for anxiety as well as difficult to control hypertension. Blood pressure has been much better controlled on her current regimen of Micardis/HCTZ as well as the metoprolol. She is also on Crestor for dyslipidemia and has had a well-controlled lipid profile. Recently, she has been concerned about fast intestinal transits, diarrhea, and/or stomach gurgling. She takes cholestyramine for this and was started on Dexilant for reflux-type symptoms. Although this has been helpful, she continues to have some of those complaints, but no other active cardiac complaints. Unfortunately, she continues to smoke. We discussed smoking cessation today. However, she is not quite ready to quit due to stress in her job. Fortunately her boss quit in the past year and her stress is improved significantly. She tells her she has 2 more years left and then she can retire with attention. Overall she thinks she is doing very well. The only other new issue is that she broke a small bone in the left foot but that has healed up very quickly.  [name] returns today for followup. She reports having some upper midepigastric pain but has not been taking her PPI regularly. Just reports not taking her cholesterol medication regularly. She doesn't be taken her blood pressure medicines and her blood pressure is well-controlled today.  I saw [name] back today in follow-up. She tells me that she is only a few days away from retirement. Unfortunately there'll be a 3-4 week delay in insurance coverage before she can start with her new state insurance. She was told that she could get insurance coverage from [employer] however the cost would be more than $800. From a medical standpoint she seems to be doing well and is interested in quitting cigarettes. She got a prescription for  Chantix but has not yet started. Blood pressure is well-controlled. She has had good cholesterol control on Crestor.  03/26/2016  [name] returns today for follow-up. Blood pressure appears to be well-controlled. She denies any chest pain, but has had some worsening fatigue and dyspnea. She says she gets short of breath walking up stairs and has had recent decreased exercise tolerance.   03/25/2017  [name] was seen today in follow-up.  She recently won a trip to [place] and unfortunately fell and fractured 3 ribs.  She seems to be recovering from that.  She is not taking pain medicine regularly.  She has had some nausea and shortness of breath which is consistent.  She had a stress test last year which was negative for ischemia and showed normal LV function.  She denies any worsening or new symptoms associated with that.  PMHx:  Past Medical History:  Diagnosis Date  . Active smoker   . Anxiety   . Cervical dysplasia   . Dyslipidemia   . Endometriosis   . History of nuclear stress test 07/07/2010   dipyridamole; low risk, post-stress EF 65%  . Hypertension   . Migraine   . Osteopenia    08/2006 and 08/2008 Dexa Scans w Isaiah Blakes     Past Surgical History:  Procedure Laterality Date  . BACK SURGERY  2002   RUPTURED DISC   . CARDIAC CATHETERIZATION  11/20/2002   patent coronary arteries   . CHOLECYSTECTOMY  2006  . COLPOSCOPY    . GYNECOLOGIC CRYOSURGERY    . NOSE SURGERY  X2  . OOPHORECTOMY     BSO  . SHOULDER SURGERY     ROTATOR CUFF X 2  . THROAT SURGERY     REMOVAL OF TUMOR  . VAGINAL HYSTERECTOMY  2000   WITH BSO    FAMHx:  Family History  Problem Relation Age of Onset  . Hypertension Mother   . Cancer Mother        LIVER AND LUNG  . Hypertension Brother   . Heart disease Brother   . Hypertension Maternal Grandfather   . Heart disease Maternal Grandfather   . Heart attack Maternal Grandfather     SOCHx:   reports that she has been smoking  cigarettes.  She has a 42.00 pack-year smoking history. she has never used smokeless tobacco. She reports that she drinks about 3.6 - 4.8 oz of alcohol per week. She reports that she does not use drugs.  ALLERGIES:  Allergies  Allergen Reactions  . Iodinated Diagnostic Agents Itching    itching but no hives just after iv contrast injection w/ 13 hr prep, future contrast not advised unless absolutely necessary, 13 hr prep would still be advised.//a.calhoun  . Ioxaglate Itching    itching but no hives just after iv contrast injection w/ 13 hr prep, future contrast not advised unless absolutely necessary, 13 hr prep would still be advised.//a.calhoun  . Omnipaque [Iohexol] Anaphylaxis  . Buprenorphine Hcl Nausea And Vomiting  . Codeine Nausea And Vomiting  . Erythromycin   . Morphine And Related Nausea And Vomiting    ROS: A comprehensive review of systems was negative.  HOME MEDS: Current Outpatient Medications  Medication Sig Dispense Refill  . ALPRAZolam (XANAX) 0.5 MG tablet Take 0.5 mg by mouth at bedtime as needed for anxiety.    Marland Kitchen amLODipine (NORVASC) 5 MG tablet take 1 tablet by mouth once daily 60 tablet 0  . aspirin EC 81 MG tablet Take 81 mg by mouth daily.    Marland Kitchen azithromycin (ZITHROMAX) 250 MG tablet TK 2 TS PO FOR 1 DAY THEN TK 1 T PO D FOR 4 DAYS  1  . BELSOMRA 20 MG TABS Take 20 mg by mouth daily.  0  . denosumab (PROLIA) 60 MG/ML SOLN injection Inject 60 mg into the skin every 6 (six) months. Administer in upper arm, thigh, or abdomen    . diphenhydrAMINE (BENADRYL) 25 MG tablet Take 25 mg by mouth every 6 (six) hours as needed.    . fluconazole (DIFLUCAN) 150 MG tablet Take 150 mg by mouth daily.    Marland Kitchen loperamide (IMODIUM) 2 MG capsule Take 2 mg by mouth 4 (four) times daily as needed for diarrhea or loose stools.    . Mesalamine (DELZICOL) 400 MG CPDR DR capsule Take 400 mg by mouth 4 (four) times daily.    . metoprolol succinate (TOPROL-XL) 50 MG 24 hr tablet take 1  tablet by mouth once daily 60 tablet 0  . montelukast (SINGULAIR) 10 MG tablet Take 10 mg by mouth daily.  0  . oxyCODONE-acetaminophen (PERCOCET/ROXICET) 5-325 MG tablet Take 0.5-1 tablets by mouth every 6 (six) hours as needed.  0  . rosuvastatin (CRESTOR) 10 MG tablet take 1 tablet by mouth once daily 60 tablet 0  . temazepam (RESTORIL) 15 MG capsule Take 15 mg by mouth at bedtime as needed for sleep.    . VENTOLIN HFA 108 (90 BASE) MCG/ACT inhaler as needed.    . Vitamin D, Ergocalciferol, (DRISDOL) 50000 units CAPS capsule Take 1  capsule by mouth once a week.  0   No current facility-administered medications for this visit.     LABS/IMAGING: No results found for this or any previous visit (from the past 48 hour(s)). No results found.  VITALS: BP 122/72   Pulse 79   Ht 5\' 4"  (1.626 m)   Wt 113 lb (51.3 kg)   BMI 19.40 kg/m   EXAM: General appearance: alert and no distress Neck: no adenopathy, no carotid bruit, no JVD, supple, symmetrical, trachea midline and thyroid not enlarged, symmetric, no tenderness/mass/nodules Lungs: clear to auscultation bilaterally Heart: regular rate and rhythm, S1, S2 normal, no murmur, click, rub or gallop Abdomen: soft, non-tender; bowel sounds normal; no masses,  no organomegaly Extremities: extremities normal, atraumatic, no cyanosis or edema Pulses: 2+ and symmetric Skin: Skin color, texture, turgor normal. No rashes or lesions Neurologic: Grossly normal  EKG: Normal sinus rhythm at 79, nonspecific T wave changes-personally reviewed   ASSESSMENT:  1. Dyspnea on exertion and fatigue-low risk nuclear stress test (03/2016) 2. Hypertension-controlled 3. Hyperlipidemia- on Crestor 4. Tobacco abuse  PLAN: 1.   Mrs. Michalle is doing well with stable dyspnea and fatigue.  She has difficulty sleeping at night which plays a role in this.  Blood pressures been well controlled.  Her cholesterol is at goal.  She continues to smoke.  Recently she had  several rib fractures after a fall when in [place], but is recovering from that.  Otherwise no changes to her medicines today.  Follow-up with me annually or sooner as necessary.  Pixie Casino, MD, [facility], [place] Director of the [facility]  [facility] Attending Cardiologist  Direct Dial: [phone]  Fax: [phone]  Website:  [email or web address] 03/25/2017, 3:59 PM

## 2017-03-30 ENCOUNTER — Other Ambulatory Visit: Payer: Self-pay

## 2017-03-30 MED ORDER — METOPROLOL SUCCINATE ER 50 MG PO TB24: 50.0000 mg | ORAL_TABLET | Freq: Every day | ORAL | 6 refills | Status: DC

## 2017-03-30 MED ORDER — AMLODIPINE BESYLATE 5 MG PO TABS: 5.0000 mg | ORAL_TABLET | Freq: Every day | ORAL | 6 refills | Status: DC

## 2017-03-30 MED ORDER — ROSUVASTATIN CALCIUM 10 MG PO TABS: 10.0000 mg | ORAL_TABLET | Freq: Every day | ORAL | 0 refills | Status: DC

## 2017-04-12 ENCOUNTER — Other Ambulatory Visit: Payer: Self-pay

## 2017-04-12 MED ORDER — ROSUVASTATIN CALCIUM 10 MG PO TABS: 10.0000 mg | ORAL_TABLET | Freq: Every day | ORAL | 0 refills | Status: DC

## 2017-06-10 ENCOUNTER — Other Ambulatory Visit: Payer: Self-pay | Admitting: Internal Medicine

## 2017-06-10 NOTE — Telephone Encounter (Signed)
REFILL 

## 2017-06-27 ENCOUNTER — Other Ambulatory Visit: Payer: Self-pay | Admitting: *Deleted

## 2017-09-01 ENCOUNTER — Other Ambulatory Visit: Payer: Self-pay

## 2017-09-05 ENCOUNTER — Other Ambulatory Visit: Payer: Self-pay

## 2017-09-05 ENCOUNTER — Telehealth: Payer: Self-pay | Admitting: Internal Medicine

## 2017-09-05 MED ORDER — TELMISARTAN-HCTZ 80-12.5 MG PO TABS: 1.0000 | ORAL_TABLET | Freq: Every day | ORAL | 2 refills | Status: DC

## 2017-09-05 NOTE — Telephone Encounter (Signed)
New Message:      Pt c/o medication issue:  1. Name of Medication: telmesartan  2. How are you currently taking this medication (dosage and times per day)? N/A  3. Are you having a reaction (difficulty breathing--STAT)? No  4. What is your medication issue? Pt states we will not refill this prescription for her and she would like to know why

## 2017-09-05 NOTE — Telephone Encounter (Signed)
Returned call to patient. She states she has been on telmisartan-hctz 80-12.5mg  for 5 years and it was not refilled. Per chart review, was taken off med list 02/2017 for "change in therapy" but MD did not change meds, was removed during work up. Apologized to patient for inconvenience. Rx(s) sent to pharmacy electronically.

## 2017-10-31 ENCOUNTER — Encounter: Payer: Self-pay | Admitting: Women's Health

## 2017-10-31 ENCOUNTER — Ambulatory Visit: Payer: Medicare Other | Admitting: Women's Health

## 2017-10-31 VITALS — Ht 64.0 in | Wt 109.0 lb

## 2017-10-31 DIAGNOSIS — N898 Other specified noninflammatory disorders of vagina: Secondary | ICD-10-CM | POA: Diagnosis not present

## 2017-10-31 DIAGNOSIS — B373 Candidiasis of vulva and vagina: Secondary | ICD-10-CM

## 2017-10-31 DIAGNOSIS — Z01419 Encounter for gynecological examination (general) (routine) without abnormal findings: Secondary | ICD-10-CM | POA: Diagnosis not present

## 2017-10-31 DIAGNOSIS — B3731 Acute candidiasis of vulva and vagina: Secondary | ICD-10-CM

## 2017-10-31 LAB — WET PREP FOR TRICH, YEAST, CLUE

## 2017-10-31 MED ORDER — TERCONAZOLE 0.4 % VA CREA
1.0000 | TOPICAL_CREAM | Freq: Every day | VAGINAL | 0 refills | Status: DC
Start: 2017-10-31 — End: 2018-04-06

## 2017-10-31 MED ORDER — ALPRAZOLAM 0.5 MG PO TABS: 0.5000 mg | ORAL_TABLET | Freq: Every evening | ORAL | 0 refills | Status: DC | PRN

## 2017-10-31 MED ORDER — NYSTATIN 100000 UNIT/GM EX POWD: Freq: Four times a day (QID) | CUTANEOUS | 0 refills | Status: DC

## 2017-10-31 NOTE — Patient Instructions (Signed)
Health Maintenance for Postmenopausal Women Menopause is a normal process in which your reproductive ability comes to an end. This process happens gradually over a span of months to years, usually between the ages of 22 and 9. Menopause is complete when you have missed 12 consecutive menstrual periods. It is important to talk with your health care provider about some of the most common conditions that affect postmenopausal women, such as heart disease, cancer, and bone loss (osteoporosis). Adopting a healthy lifestyle and getting preventive care can help to promote your health and wellness. Those actions can also lower your chances of developing some of these common conditions. What should I know about menopause? During menopause, you may experience a number of symptoms, such as:  Moderate-to-severe hot flashes.  Night sweats.  Decrease in sex drive.  Mood swings.  Headaches.  Tiredness.  Irritability.  Memory problems.  Insomnia.  Choosing to treat or not to treat menopausal changes is an individual decision that you make with your health care provider. What should I know about hormone replacement therapy and supplements? Hormone therapy products are effective for treating symptoms that are associated with menopause, such as hot flashes and night sweats. Hormone replacement carries certain risks, especially as you become older. If you are thinking about using estrogen or estrogen with progestin treatments, discuss the benefits and risks with your health care provider. What should I know about heart disease and stroke? Heart disease, heart attack, and stroke become more likely as you age. This may be due, in part, to the hormonal changes that your body experiences during menopause. These can affect how your body processes dietary fats, triglycerides, and cholesterol. Heart attack and stroke are both medical emergencies. There are many things that you can do to help prevent heart disease  and stroke:  Have your blood pressure checked at least every 1-2 years. High blood pressure causes heart disease and increases the risk of stroke.  If you are 53-22 years old, ask your health care provider if you should take aspirin to prevent a heart attack or a stroke.  Do not use any tobacco products, including cigarettes, chewing tobacco, or electronic cigarettes. If you need help quitting, ask your health care provider.  It is important to eat a healthy diet and maintain a healthy weight. ? Be sure to include plenty of vegetables, fruits, low-fat dairy products, and lean protein. ? Avoid eating foods that are high in solid fats, added sugars, or salt (sodium).  Get regular exercise. This is one of the most important things that you can do for your health. ? Try to exercise for at least 150 minutes each week. The type of exercise that you do should increase your heart rate and make you sweat. This is known as moderate-intensity exercise. ? Try to do strengthening exercises at least twice each week. Do these in addition to the moderate-intensity exercise.  Know your numbers.Ask your health care provider to check your cholesterol and your blood glucose. Continue to have your blood tested as directed by your health care provider.  What should I know about cancer screening? There are several types of cancer. Take the following steps to reduce your risk and to catch any cancer development as early as possible. Breast Cancer  Practice breast self-awareness. ? This means understanding how your breasts normally appear and feel. ? It also means doing regular breast self-exams. Let your health care provider know about any changes, no matter how small.  If you are 40  or older, have a clinician do a breast exam (clinical breast exam or CBE) every year. Depending on your age, family history, and medical history, it may be recommended that you also have a yearly breast X-ray (mammogram).  If you  have a family history of breast cancer, talk with your health care provider about genetic screening.  If you are at high risk for breast cancer, talk with your health care provider about having an MRI and a mammogram every year.  Breast cancer (BRCA) gene test is recommended for women who have family members with BRCA-related cancers. Results of the assessment will determine the need for genetic counseling and BRCA1 and for BRCA2 testing. BRCA-related cancers include these types: ? Breast. This occurs in males or females. ? Ovarian. ? Tubal. This may also be called fallopian tube cancer. ? Cancer of the abdominal or pelvic lining (peritoneal cancer). ? Prostate. ? Pancreatic.  Cervical, Uterine, and Ovarian Cancer Your health care provider may recommend that you be screened regularly for cancer of the pelvic organs. These include your ovaries, uterus, and vagina. This screening involves a pelvic exam, which includes checking for microscopic changes to the surface of your cervix (Pap test).  For women ages 21-65, health care providers may recommend a pelvic exam and a Pap test every three years. For women ages 79-65, they may recommend the Pap test and pelvic exam, combined with testing for human papilloma virus (HPV), every five years. Some types of HPV increase your risk of cervical cancer. Testing for HPV may also be done on women of any age who have unclear Pap test results.  Other health care providers may not recommend any screening for nonpregnant women who are considered low risk for pelvic cancer and have no symptoms. Ask your health care provider if a screening pelvic exam is right for you.  If you have had past treatment for cervical cancer or a condition that could lead to cancer, you need Pap tests and screening for cancer for at least 20 years after your treatment. If Pap tests have been discontinued for you, your risk factors (such as having a new sexual partner) need to be  reassessed to determine if you should start having screenings again. Some women have medical problems that increase the chance of getting cervical cancer. In these cases, your health care provider may recommend that you have screening and Pap tests more often.  If you have a family history of uterine cancer or ovarian cancer, talk with your health care provider about genetic screening.  If you have vaginal bleeding after reaching menopause, tell your health care provider.  There are currently no reliable tests available to screen for ovarian cancer.  Lung Cancer Lung cancer screening is recommended for adults 69-62 years old who are at high risk for lung cancer because of a history of smoking. A yearly low-dose CT scan of the lungs is recommended if you:  Currently smoke.  Have a history of at least 30 pack-years of smoking and you currently smoke or have quit within the past 15 years. A pack-year is smoking an average of one pack of cigarettes per day for one year.  Yearly screening should:  Continue until it has been 15 years since you quit.  Stop if you develop a health problem that would prevent you from having lung cancer treatment.  Colorectal Cancer  This type of cancer can be detected and can often be prevented.  Routine colorectal cancer screening usually begins at  age 42 and continues through age 45.  If you have risk factors for colon cancer, your health care provider may recommend that you be screened at an earlier age.  If you have a family history of colorectal cancer, talk with your health care provider about genetic screening.  Your health care provider may also recommend using home test kits to check for hidden blood in your stool.  A small camera at the end of a tube can be used to examine your colon directly (sigmoidoscopy or colonoscopy). This is done to check for the earliest forms of colorectal cancer.  Direct examination of the colon should be repeated every  5-10 years until age 71. However, if early forms of precancerous polyps or small growths are found or if you have a family history or genetic risk for colorectal cancer, you may need to be screened more often.  Skin Cancer  Check your skin from head to toe regularly.  Monitor any moles. Be sure to tell your health care provider: ? About any new moles or changes in moles, especially if there is a change in a mole's shape or color. ? If you have a mole that is larger than the size of a pencil eraser.  If any of your family members has a history of skin cancer, especially at a Perry Brucato age, talk with your health care provider about genetic screening.  Always use sunscreen. Apply sunscreen liberally and repeatedly throughout the day.  Whenever you are outside, protect yourself by wearing long sleeves, pants, a wide-brimmed hat, and sunglasses.  What should I know about osteoporosis? Osteoporosis is a condition in which bone destruction happens more quickly than new bone creation. After menopause, you may be at an increased risk for osteoporosis. To help prevent osteoporosis or the bone fractures that can happen because of osteoporosis, the following is recommended:  If you are 46-71 years old, get at least 1,000 mg of calcium and at least 600 mg of vitamin D per day.  If you are older than age 55 but younger than age 65, get at least 1,200 mg of calcium and at least 600 mg of vitamin D per day.  If you are older than age 54, get at least 1,200 mg of calcium and at least 800 mg of vitamin D per day.  Smoking and excessive alcohol intake increase the risk of osteoporosis. Eat foods that are rich in calcium and vitamin D, and do weight-bearing exercises several times each week as directed by your health care provider. What should I know about how menopause affects my mental health? Depression may occur at any age, but it is more common as you become older. Common symptoms of depression  include:  Low or sad mood.  Changes in sleep patterns.  Changes in appetite or eating patterns.  Feeling an overall lack of motivation or enjoyment of activities that you previously enjoyed.  Frequent crying spells.  Talk with your health care provider if you think that you are experiencing depression. What should I know about immunizations? It is important that you get and maintain your immunizations. These include:  Tetanus, diphtheria, and pertussis (Tdap) booster vaccine.  Influenza every year before the flu season begins.  Pneumonia vaccine.  Shingles vaccine.  Your health care provider may also recommend other immunizations. This information is not intended to replace advice given to you by your health care provider. Make sure you discuss any questions you have with your health care provider. Document Released: 06/04/2005  Document Revised: 10/31/2015 Document Reviewed: 01/14/2015 Elsevier Interactive Patient Education  2018 Elsevier Inc.  

## 2017-10-31 NOTE — Progress Notes (Signed)
[name] [birth year] [id number]    History:    Presents for breast and pelvic exam.  2000 TVH with BSO for endometriosis on no HRT.  2014 zostavac.  Normal Pap and mammogram history.  Osteopenia on Prolia per primary care.  Primary care manages hypertension, hypercholesteremia.  Smoker.  History of colitis.  Past medical history, past surgical history, family history and social history were all reviewed and documented in the EPIC chart.  2 children, son has 2 children youngest with a heart defect and has had several open heart surgeries, daughter has 4 daughters all healthy.  Went to [place] had a fall in the shower broke ribs and had to be flown home second day of vacation.  ROS:  A ROS was performed and pertinent positives and negatives are included.  Exam:  Vitals:   10/31/17 1548  Weight: 109 lb (49.4 kg)  Height: 5\' 4"  (1.626 m)   Body mass index is 18.71 kg/m.   General appearance:  Normal Thyroid:  Symmetrical, normal in size, without palpable masses or nodularity. Respiratory  Auscultation:  Clear without wheezing or rhonchi Cardiovascular  Auscultation:  Regular rate, without rubs, murmurs or gallops  Edema/varicosities:  Not grossly evident Abdominal  Soft,nontender, without masses, guarding or rebound.  Liver/spleen:  No organomegaly noted  Hernia:  None appreciated  Skin  Inspection:  Grossly normal   Breasts: Examined lying and sitting.     Right: Without masses, retractions, discharge or axillary adenopathy.     Left: Without masses, retractions, discharge or axillary adenopathy. Gentitourinary   Inguinal/mons:  Normal without inguinal adenopathy  External genitalia:  Normal  BUS/Urethra/Skene's glands:  Normal  Vagina: Mild atrophy, erythema at introitus wet prep positive for yeast  Cervix: Absent uterus: Absent  Adnexa/parametria:     Rt: Without masses or tenderness.   Lt: Without masses or tenderness.  Anus and perineum: Normal  Digital rectal  exam: Normal sphincter tone without palpated masses or tenderness  Assessment/Plan:  67 y.o. D WF G2, P2 for breast and pelvic exam with vaginal itching  2000 TVH with BSO for endometriosis Yeast vaginitis Osteopenia on Prolia per primary care Hypertension primary care manages Smoker  Plan: Terazol 7 1 applicator at bedtime x7, prescription, proper use given and reviewed requested a cream.  SBE's,  annual screening mammogram overdue instructed to schedule.  Safety, fall prevention and importance of balance type exercise encouraged.  Vitamin D 2000 daily encouraged.  Aware of hazards of smoking tips for quitting reviewed.  Xanax 0.5 as needed.  Has used one prescription in the past year, aware of addictive properties.   Hiko, 5:14 PM 10/31/2017

## 2018-01-11 ENCOUNTER — Encounter: Payer: Self-pay | Admitting: Women's Health

## 2018-03-26 ENCOUNTER — Other Ambulatory Visit: Payer: Self-pay | Admitting: Internal Medicine

## 2018-04-06 ENCOUNTER — Encounter (INDEPENDENT_AMBULATORY_CARE_PROVIDER_SITE_OTHER): Payer: Self-pay

## 2018-04-06 ENCOUNTER — Ambulatory Visit: Payer: Medicare Other | Admitting: Internal Medicine

## 2018-04-06 ENCOUNTER — Encounter: Payer: Self-pay | Admitting: Internal Medicine

## 2018-04-06 VITALS — BP 106/80 | HR 75 | Ht 64.0 in | Wt 114.2 lb

## 2018-04-06 DIAGNOSIS — E785 Hyperlipidemia, unspecified: Secondary | ICD-10-CM

## 2018-04-06 DIAGNOSIS — Z720 Tobacco use: Secondary | ICD-10-CM | POA: Diagnosis not present

## 2018-04-06 DIAGNOSIS — I10 Essential (primary) hypertension: Secondary | ICD-10-CM

## 2018-04-06 NOTE — Patient Instructions (Signed)
Medication Instructions:  Continue current medications If you need a refill on your cardiac medications before your next appointment, please call your pharmacy.   Lab work: NONE If you have labs (blood work) drawn today and your tests are completely normal, you will receive your results only by: Marland Kitchen MyChart Message (if you have MyChart) OR . A paper copy in the mail If you have any lab test that is abnormal or we need to change your treatment, we will call you to review the results.  Testing/Procedures: NONE  Follow-Up: At [facility], you and your health needs are our priority.  As part of our continuing mission to provide you with exceptional heart care, we have created designated Provider Care Teams.  These Care Teams include your primary Cardiologist (physician) and Advanced Practice Providers (APPs -  Physician Assistants and Nurse Practitioners) who all work together to provide you with the care you need, when you need it. You will need a follow up appointment in 12 months.  Please call our office 2 months in advance to schedule this appointment.  You may see Dr. Debara Pickett or one of the following Advanced Practice Providers on your designated Care Team: Almyra Deforest, [place] . Fabian Sharp, PA-C  Any Other Special Instructions Will Be Listed Below (If Applicable).

## 2018-04-06 NOTE — Progress Notes (Signed)
OFFICE NOTE  Chief Complaint:  No complaints  Primary Care Physician: Jani Gravel, MD  HPI:  [name] is a 67 year old female I have been following for anxiety as well as difficult to control hypertension. Blood pressure has been much better controlled on her current regimen of Micardis/HCTZ as well as the metoprolol. She is also on Crestor for dyslipidemia and has had a well-controlled lipid profile. Recently, she has been concerned about fast intestinal transits, diarrhea, and/or stomach gurgling. She takes cholestyramine for this and was started on Dexilant for reflux-type symptoms. Although this has been helpful, she continues to have some of those complaints, but no other active cardiac complaints. Unfortunately, she continues to smoke. We discussed smoking cessation today. However, she is not quite ready to quit due to stress in her job. Fortunately her boss quit in the past year and her stress is improved significantly. She tells her she has 2 more years left and then she can retire with attention. Overall she thinks she is doing very well. The only other new issue is that she broke a small bone in the left foot but that has healed up very quickly.  [name] returns today for followup. She reports having some upper midepigastric pain but has not been taking her PPI regularly. Just reports not taking her cholesterol medication regularly. She doesn't be taken her blood pressure medicines and her blood pressure is well-controlled today.  I saw [name] back today in follow-up. She tells me that she is only a few days away from retirement. Unfortunately there'll be a 3-4 week delay in insurance coverage before she can start with her new state insurance. She was told that she could get insurance coverage from [place] however the cost would be more than $800. From a medical standpoint she seems to be doing well and is interested in quitting cigarettes. She got a prescription for  Chantix but has not yet started. Blood pressure is well-controlled. She has had good cholesterol control on Crestor.  03/26/2016  [name] returns today for follow-up. Blood pressure appears to be well-controlled. She denies any chest pain, but has had some worsening fatigue and dyspnea. She says she gets short of breath walking up stairs and has had recent decreased exercise tolerance.   03/25/2017  [name] was seen today in follow-up.  She recently won a trip to [place] and unfortunately fell and fractured 3 ribs.  She seems to be recovering from that.  She is not taking pain medicine regularly.  She has had some nausea and shortness of breath which is consistent.  She had a stress test last year which was negative for ischemia and showed normal LV function.  She denies any worsening or new symptoms associated with that.  04/06/2018  [name] is seen today in follow-up.  Overall she is doing well.  She denies any chest pain or worsening shortness of breath.  Her EKG, personally reviewed today shows some anteroseptal T wave inversions which are slightly worse than previously.  Despite this she is completely asymptomatic.  She is trying to work on smoking cessation.  She says she still struggles with sleep at night.  Of note her weight has been declining.  She is borderline underweight today.  Labs from June 2019 showed total cholesterol 140, HDL 56, LDL 66 and triglycerides 92.  Creatinine was normal.  PMHx:  Past Medical History:  Diagnosis Date  . Active smoker   . Anxiety   .  Cervical dysplasia   . Dyslipidemia   . Endometriosis   . History of nuclear stress test 07/07/2010   dipyridamole; low risk, post-stress EF 65%  . Hypertension   . Migraine   . Osteopenia    08/2006 and 08/2008 Dexa Scans w Isaiah Blakes     Past Surgical History:  Procedure Laterality Date  . BACK SURGERY  2002   RUPTURED DISC   . CARDIAC CATHETERIZATION  11/20/2002   patent coronary arteries   .  CHOLECYSTECTOMY  2006  . COLPOSCOPY    . GYNECOLOGIC CRYOSURGERY    . NOSE SURGERY     X2  . OOPHORECTOMY     BSO  . SHOULDER SURGERY     ROTATOR CUFF X 2  . THROAT SURGERY     REMOVAL OF TUMOR  . VAGINAL HYSTERECTOMY  2000   WITH BSO    FAMHx:  Family History  Problem Relation Age of Onset  . Hypertension Mother   . Cancer Mother        LIVER AND LUNG  . Hypertension Brother   . Heart disease Brother   . Hypertension Maternal Grandfather   . Heart disease Maternal Grandfather   . Heart attack Maternal Grandfather     SOCHx:   reports that she has been smoking cigarettes. She has a 42.00 pack-year smoking history. She has never used smokeless tobacco. She reports current alcohol use of about 6.0 - 8.0 standard drinks of alcohol per week. She reports that she does not use drugs.  ALLERGIES:  Allergies  Allergen Reactions  . Iodinated Diagnostic Agents Itching    itching but no hives just after iv contrast injection w/ 13 hr prep, future contrast not advised unless absolutely necessary, 13 hr prep would still be advised.//a.calhoun  . Ioxaglate Itching    itching but no hives just after iv contrast injection w/ 13 hr prep, future contrast not advised unless absolutely necessary, 13 hr prep would still be advised.//a.calhoun  . Omnipaque [Iohexol] Anaphylaxis  . Buprenorphine Hcl Nausea And Vomiting  . Codeine Nausea And Vomiting  . Erythromycin   . Morphine And Related Nausea And Vomiting    ROS: Pertinent items noted in HPI and remainder of comprehensive ROS otherwise negative.  HOME MEDS: Current Outpatient Medications  Medication Sig Dispense Refill  . ALPRAZolam (XANAX) 1 MG tablet Take 1 mg by mouth 3 (three) times daily as needed for anxiety.    Marland Kitchen amLODipine (NORVASC) 5 MG tablet Take 1 tablet (5 mg total) by mouth daily. 60 tablet 6  . aspirin EC 81 MG tablet Take 81 mg by mouth daily.    . cholestyramine (QUESTRAN) 4 GM/DOSE powder Take 4 g by mouth every  other day.    . denosumab (PROLIA) 60 MG/ML SOLN injection Inject 60 mg into the skin every 6 (six) months. Administer in upper arm, thigh, or abdomen    . diphenhydrAMINE (BENADRYL) 25 MG tablet Take 25 mg by mouth every 6 (six) hours as needed.    . loperamide (IMODIUM) 2 MG capsule Take 2 mg by mouth 4 (four) times daily as needed for diarrhea or loose stools.    . metoprolol succinate (TOPROL-XL) 50 MG 24 hr tablet Take 1 tablet (50 mg total) by mouth daily. Take with or immediately following a meal. 60 tablet 6  . montelukast (SINGULAIR) 10 MG tablet Take 10 mg by mouth daily.  0  . nystatin (MYCOSTATIN/NYSTOP) powder Apply topically 4 (four) times daily. 15 g 0  .  rosuvastatin (CRESTOR) 10 MG tablet TAKE 1 TABLET(10 MG) BY MOUTH DAILY 90 tablet 0  . telmisartan-hydrochlorothiazide (MICARDIS HCT) 80-12.5 MG tablet Take 1 tablet by mouth daily. 90 tablet 2  . VENTOLIN HFA 108 (90 BASE) MCG/ACT inhaler as needed.    . BELSOMRA 20 MG TABS Take 20 mg by mouth daily.  0   No current facility-administered medications for this visit.     LABS/IMAGING: No results found for this or any previous visit (from the past 48 hour(s)). No results found.  VITALS: BP 106/80   Pulse 75   Ht 5\' 4"  (1.626 m)   Wt 114 lb 3.2 oz (51.8 kg)   BMI 19.60 kg/m   EXAM: General appearance: alert and no distress Neck: no adenopathy, no carotid bruit, no JVD, supple, symmetrical, trachea midline and thyroid not enlarged, symmetric, no tenderness/mass/nodules Lungs: clear to auscultation bilaterally Heart: regular rate and rhythm, S1, S2 normal, no murmur, click, rub or gallop Abdomen: soft, non-tender; bowel sounds normal; no masses,  no organomegaly Extremities: extremities normal, atraumatic, no cyanosis or edema Pulses: 2+ and symmetric Skin: Skin color, texture, turgor normal. No rashes or lesions Neurologic: Grossly normal  EKG: Normal sinus rhythm, anteroseptal T wave inversions-personally  reviewed  ASSESSMENT:  1. Dyspnea on exertion and fatigue-low risk nuclear stress test (03/2016) 2. Hypertension-controlled 3. Hyperlipidemia- on Crestor 4. Tobacco abuse  PLAN: 1.   Mrs. Stephanieann not describing any worsening dyspnea on exertion, chest pain or other concerning symptoms.  She did have a low-grade stress test about 2 years ago.  Her EKG shows some abnormal anteroseptal T waves today but because she is asymptomatic I will not pursue further testing.  I encouraged her to reach out to [place] if she has any further symptoms.  Blood pressures well controlled.  Her weight is declining and she is borderline underweight at this point.  She is working on smoking cessation.  She reports poor sleep at night.  Will defer to her PCP regarding this.  Follow-up annually or sooner as necessary.  Pixie Casino, MD, [facility], [place] Director of the [facility]  [facility] Attending Cardiologist  Direct Dial: [phone]  Fax: [phone]  Website:  [email or web address] 04/06/2018, 10:06 AM

## 2018-06-16 ENCOUNTER — Other Ambulatory Visit: Payer: Self-pay | Admitting: Internal Medicine

## 2018-08-04 ENCOUNTER — Other Ambulatory Visit: Payer: Self-pay | Admitting: Internal Medicine

## 2018-08-07 ENCOUNTER — Other Ambulatory Visit: Payer: Self-pay | Admitting: Internal Medicine

## 2018-11-04 ENCOUNTER — Other Ambulatory Visit: Payer: Self-pay | Admitting: Internal Medicine

## 2018-11-22 NOTE — Progress Notes (Signed)
Cardiology Office Note:    Date:  11/23/2018   ID:  [name], DOB 1950-11-01, MRN [id number]  PCP:  Jani Gravel, MD  Cardiologist:  Pixie Casino, MD   Referring MD: Jani Gravel, MD   Chief Complaint  Patient presents with  . Follow-up    medication refill    History of Present Illness:    [name] is a 68 y.o. female with a hx of anxiety, difficult to control HTN, dyslipidemia, and tobacco use. She had normal coronaries by heart cath in 2004 and negative stress test in 2012. Stress test in 2017 did not show ischemia but did show inferior lateral TWI on EKG. She remains asymptomatic, so further evaluation was not pursued. She was last seen in clinic by Dr. Debara Pickett 04/06/18 and was doing well without complaints. EKG notable for abnormal anteroseptal T waves at that time, but she remained asymptomatic.   She presents today for routine follow up and medication refills. She is maintained on ASA, statin, norvasc, toprol, and telmisartan-HCTZ.    She has some anxiety because her brother suddenly died last month of a heart attack.   She denies anginal complaints. She continues to smoke. BP is well-controlled on her current regimen. KPN with recent normal renal function and lipid profile. Overall she is doing very well.   Past Medical History:  Diagnosis Date  . Active smoker   . Anxiety   . Cervical dysplasia   . Dyslipidemia   . Endometriosis   . History of nuclear stress test 07/07/2010   dipyridamole; low risk, post-stress EF 65%  . Hypertension   . Migraine   . Osteopenia    08/2006 and 08/2008 Dexa Scans w Isaiah Blakes     Past Surgical History:  Procedure Laterality Date  . BACK SURGERY  2002   RUPTURED DISC   . CARDIAC CATHETERIZATION  11/20/2002   patent coronary arteries   . CHOLECYSTECTOMY  2006  . COLPOSCOPY    . GYNECOLOGIC CRYOSURGERY    . NOSE SURGERY     X2  . OOPHORECTOMY     BSO  . SHOULDER SURGERY     ROTATOR CUFF X 2  . THROAT SURGERY     REMOVAL OF TUMOR  . VAGINAL HYSTERECTOMY  2000   WITH BSO    Current Medications: Current Meds  Medication Sig  . ALPRAZolam (XANAX) 1 MG tablet Take 1 mg by mouth 3 (three) times daily as needed for anxiety.  Marland Kitchen amLODipine (NORVASC) 5 MG tablet Take 1 tablet (5 mg total) by mouth daily.  Marland Kitchen aspirin EC 81 MG tablet Take 81 mg by mouth daily.  . cholestyramine (QUESTRAN) 4 GM/DOSE powder Take 4 g by mouth every other day.  . denosumab (PROLIA) 60 MG/ML SOLN injection Inject 60 mg into the skin every 6 (six) months. Administer in upper arm, thigh, or abdomen  . diphenhydrAMINE (BENADRYL) 25 MG tablet Take 25 mg by mouth every 6 (six) hours as needed.  . metoprolol succinate (TOPROL-XL) 50 MG 24 hr tablet Take 1 tablet (50 mg total) by mouth daily. Take with or immediately following a meal.  . montelukast (SINGULAIR) 10 MG tablet Take 10 mg by mouth daily.  . rosuvastatin (CRESTOR) 10 MG tablet Take 1 tablet (10 mg total) by mouth daily.  Marland Kitchen telmisartan-hydrochlorothiazide (MICARDIS HCT) 80-12.5 MG tablet Take 1 tablet by mouth daily.  . temazepam (RESTORIL) 15 MG capsule Take 1 capsule by mouth at bedtime as needed for sleep.  Marland Kitchen  VENTOLIN HFA 108 (90 BASE) MCG/ACT inhaler as needed.  . Vitamin D, Ergocalciferol, (DRISDOL) 1.25 MG (50000 UT) CAPS capsule Take 1 capsule by mouth once a week.  . [DISCONTINUED] amLODipine (NORVASC) 5 MG tablet TAKE 1 TABLET(5 MG) BY MOUTH DAILY  . [DISCONTINUED] metoprolol succinate (TOPROL-XL) 50 MG 24 hr tablet Take 1 tablet (50 mg total) by mouth daily. Take with or immediately following a meal.  . [DISCONTINUED] rosuvastatin (CRESTOR) 10 MG tablet TAKE 1 TABLET(10 MG) BY MOUTH DAILY  . [DISCONTINUED] telmisartan-hydrochlorothiazide (MICARDIS HCT) 80-12.5 MG tablet Take 1 tablet by mouth daily. Pt needs to make appt for further refills     Allergies:   Iodinated diagnostic agents, Ioxaglate, Omnipaque [iohexol], Buprenorphine hcl, Codeine, Erythromycin, and  Morphine and related   Social History   Socioeconomic History  . Marital status: Divorced    Spouse name: Not on file  . Number of children: Not on file  . Years of education: Not on file  . Highest education level: Not on file  Occupational History  . Not on file  Social Needs  . Financial resource strain: Not on file  . Food insecurity    Worry: Not on file    Inability: Not on file  . Transportation needs    Medical: Not on file    Non-medical: Not on file  Tobacco Use  . Smoking status: Current Every Day Smoker    Packs/day: 1.00    Years: 42.00    Pack years: 42.00    Types: Cigarettes  . Smokeless tobacco: Never Used  Substance and Sexual Activity  . Alcohol use: Yes    Alcohol/week: 6.0 - 8.0 standard drinks    Types: 6 - 8 Standard drinks or equivalent per week  . Drug use: No  . Sexual activity: Never    Birth control/protection: Surgical    Comment: INTERCOUSRE AGE 87,SEXUAL PARTNERS MORE THAN 5  Lifestyle  . Physical activity    Days per week: Not on file    Minutes per session: Not on file  . Stress: Not on file  Relationships  . Social Herbalist on phone: Not on file    Gets together: Not on file    Attends religious service: Not on file    Active member of club or organization: Not on file    Attends meetings of clubs or organizations: Not on file    Relationship status: Not on file  Other Topics Concern  . Not on file  Social History Narrative  . Not on file     Family History: The patient's family history includes Cancer in her mother; Heart attack in her maternal grandfather; Heart disease in her brother and maternal grandfather; Hypertension in her brother, maternal grandfather, and mother.  ROS:   Please see the history of present illness.     All other systems reviewed and are negative.  EKGs/Labs/Other Studies Reviewed:    The following studies were reviewed today:  Myoview 04/07/16:  The left ventricular ejection  fraction is hyperdynamic (>65%).  Nuclear stress EF: 71%.  The study is normal.  This is a low risk study.  There was no ST segment deviation noted during stress.   Normal resting and stress perfusion. No ischemia or infarction EF 71% Baseline ECG with inferior lateral T wave inversions   EKG:  EKG is not ordered today.   Recent Labs: No results found for requested labs within last 8760 hours.  Recent Lipid Panel  Component Value Date/Time   CHOL  06/29/2010 0415    168        ATP III CLASSIFICATION:  <200     mg/dL   Desirable  200-239  mg/dL   Borderline High  >=240    mg/dL   High          TRIG 91 06/29/2010 0415   HDL 74 06/29/2010 0415   CHOLHDL 2.3 06/29/2010 0415   VLDL 18 06/29/2010 0415   LDLCALC  06/29/2010 0415    76        Total Cholesterol/HDL:CHD Risk Coronary Heart Disease Risk Table                     Men   Women  1/2 Average Risk   3.4   3.3  Average Risk       5.0   4.4  2 X Average Risk   9.6   7.1  3 X Average Risk  23.4   11.0        Use the calculated Patient Ratio above and the CHD Risk Table to determine the patient's CHD Risk.        ATP III CLASSIFICATION (LDL):  <100     mg/dL   Optimal  100-129  mg/dL   Near or Above                    Optimal  130-159  mg/dL   Borderline  160-189  mg/dL   High  >190     mg/dL   Very High    Physical Exam:    VS:  BP 127/81   Pulse 92   Temp 99.4 F (37.4 C) (Temporal)   Ht 5\' 4"  (1.626 m)   Wt 105 lb (47.6 kg)   SpO2 93%   BMI 18.02 kg/m     Wt Readings from Last 3 Encounters:  11/23/18 105 lb (47.6 kg)  04/06/18 114 lb 3.2 oz (51.8 kg)  10/31/17 109 lb (49.4 kg)     GEN:  Well nourished, well developed in no acute distress HEENT: Normal NECK: No JVD; No carotid bruits LYMPHATICS: No lymphadenopathy CARDIAC: RRR, no murmurs, rubs, gallops RESPIRATORY:  Clear to auscultation without rales, wheezing or rhonchi  ABDOMEN: Soft, non-tender, non-distended MUSCULOSKELETAL:  No  edema; No deformity  SKIN: Warm and dry NEUROLOGIC:  Alert and oriented x 3 PSYCHIATRIC:  Normal affect   ASSESSMENT:    1. Essential hypertension   2. Dyslipidemia   3. Tobacco abuse   4. SOB (shortness of breath)    PLAN:    In order of problems listed above:  Essential hypertension  Pressure is well-controlled. Continue on current regimen. Recent labs with normal creatinine.   Dyslipidemia  Per KPN, lipids on 09/2018: Total cholesterol: 175 Triglycerides: 132 HDL: 72 LDL: 77 Continue 10 mg crestor.   Tobacco abuse  No plans to quit smoking  SOB (shortness of breath) Stable   Follow up with Dr. Debara Pickett in 6 months.    Medication Adjustments/Labs and Tests Ordered: Current medicines are reviewed at length with the patient today.  Concerns regarding medicines are outlined above.  No orders of the defined types were placed in this encounter.  Meds ordered this encounter  Medications  . amLODipine (NORVASC) 5 MG tablet    Sig: Take 1 tablet (5 mg total) by mouth daily.    Dispense:  90 tablet    Refill:  1  .  metoprolol succinate (TOPROL-XL) 50 MG 24 hr tablet    Sig: Take 1 tablet (50 mg total) by mouth daily. Take with or immediately following a meal.    Dispense:  90 tablet    Refill:  1  . rosuvastatin (CRESTOR) 10 MG tablet    Sig: Take 1 tablet (10 mg total) by mouth daily.    Dispense:  90 tablet    Refill:  1  . telmisartan-hydrochlorothiazide (MICARDIS HCT) 80-12.5 MG tablet    Sig: Take 1 tablet by mouth daily.    Dispense:  90 tablet    Refill:  1    Signed, Ledora Bottcher, [place]  11/23/2018 4:32 PM    [facility]

## 2018-11-23 ENCOUNTER — Ambulatory Visit (INDEPENDENT_AMBULATORY_CARE_PROVIDER_SITE_OTHER): Payer: Medicare Other | Admitting: Physician Assistant

## 2018-11-23 ENCOUNTER — Other Ambulatory Visit: Payer: Self-pay

## 2018-11-23 ENCOUNTER — Encounter (INDEPENDENT_AMBULATORY_CARE_PROVIDER_SITE_OTHER): Payer: Self-pay

## 2018-11-23 ENCOUNTER — Encounter: Payer: Self-pay | Admitting: Physician Assistant

## 2018-11-23 VITALS — BP 127/81 | HR 92 | Temp 99.4°F | Ht 64.0 in | Wt 105.0 lb

## 2018-11-23 DIAGNOSIS — Z720 Tobacco use: Secondary | ICD-10-CM

## 2018-11-23 DIAGNOSIS — E785 Hyperlipidemia, unspecified: Secondary | ICD-10-CM

## 2018-11-23 DIAGNOSIS — I10 Essential (primary) hypertension: Secondary | ICD-10-CM | POA: Diagnosis not present

## 2018-11-23 DIAGNOSIS — R0602 Shortness of breath: Secondary | ICD-10-CM | POA: Diagnosis not present

## 2018-11-23 MED ORDER — TELMISARTAN-HCTZ 80-12.5 MG PO TABS: 1.0000 | ORAL_TABLET | Freq: Every day | ORAL | 1 refills | Status: DC

## 2018-11-23 MED ORDER — METOPROLOL SUCCINATE ER 50 MG PO TB24: 50.0000 mg | ORAL_TABLET | Freq: Every day | ORAL | 1 refills | Status: DC

## 2018-11-23 MED ORDER — AMLODIPINE BESYLATE 5 MG PO TABS: 5.0000 mg | ORAL_TABLET | Freq: Every day | ORAL | 1 refills | Status: DC

## 2018-11-23 MED ORDER — ROSUVASTATIN CALCIUM 10 MG PO TABS: 10.0000 mg | ORAL_TABLET | Freq: Every day | ORAL | 1 refills | Status: DC

## 2018-11-23 NOTE — Patient Instructions (Addendum)
Medication Instructions:  Your physician recommends that you continue on your current medications as directed. Please refer to the Current Medication list given to you today.  If you need a refill on your cardiac medications before your next appointment, please call your pharmacy.    Follow-Up: At [facility], you and your health needs are our priority.  As part of our continuing mission to provide you with exceptional heart care, we have created designated Provider Care Teams.  These Care Teams include your primary Cardiologist (physician) and Advanced Practice Providers (APPs -  Physician Assistants and Nurse Practitioners) who all work together to provide you with the care you need, when you need it. . Please keep your scheduled follow-up appointment with Dr. Debara Pickett in December.  Any Other Special Instructions Will Be Listed Below (If Applicable). None

## 2019-01-17 ENCOUNTER — Encounter: Payer: Self-pay | Admitting: Women's Health

## 2019-01-22 ENCOUNTER — Encounter: Payer: Self-pay | Admitting: Women's Health

## 2019-04-09 ENCOUNTER — Ambulatory Visit: Payer: Medicare Other | Admitting: Internal Medicine

## 2019-04-09 ENCOUNTER — Encounter: Payer: Self-pay | Admitting: Internal Medicine

## 2019-04-09 ENCOUNTER — Other Ambulatory Visit: Payer: Self-pay

## 2019-04-09 VITALS — BP 104/70 | HR 85 | Ht 64.0 in | Wt 106.8 lb

## 2019-04-09 DIAGNOSIS — E785 Hyperlipidemia, unspecified: Secondary | ICD-10-CM | POA: Diagnosis not present

## 2019-04-09 DIAGNOSIS — R911 Solitary pulmonary nodule: Secondary | ICD-10-CM | POA: Diagnosis not present

## 2019-04-09 DIAGNOSIS — Z801 Family history of malignant neoplasm of trachea, bronchus and lung: Secondary | ICD-10-CM

## 2019-04-09 DIAGNOSIS — F17200 Nicotine dependence, unspecified, uncomplicated: Secondary | ICD-10-CM | POA: Diagnosis not present

## 2019-04-09 DIAGNOSIS — R634 Abnormal weight loss: Secondary | ICD-10-CM | POA: Diagnosis not present

## 2019-04-09 DIAGNOSIS — I10 Essential (primary) hypertension: Secondary | ICD-10-CM

## 2019-04-09 MED ORDER — METOPROLOL SUCCINATE ER 25 MG PO TB24: 25.0000 mg | ORAL_TABLET | Freq: Every day | ORAL | 3 refills | Status: DC

## 2019-04-09 NOTE — Progress Notes (Signed)
OFFICE NOTE  Chief Complaint:  No complaints  Primary Care Physician: Jani Gravel, MD  HPI:  [name] is a 68 year old female I have been following for anxiety as well as difficult to control hypertension. Blood pressure has been much better controlled on her current regimen of Micardis/HCTZ as well as the metoprolol. She is also on Crestor for dyslipidemia and has had a well-controlled lipid profile. Recently, she has been concerned about fast intestinal transits, diarrhea, and/or stomach gurgling. She takes cholestyramine for this and was started on Dexilant for reflux-type symptoms. Although this has been helpful, she continues to have some of those complaints, but no other active cardiac complaints. Unfortunately, she continues to smoke. We discussed smoking cessation today. However, she is not quite ready to quit due to stress in her job. Fortunately her boss quit in the past year and her stress is improved significantly. She tells her she has 2 more years left and then she can retire with attention. Overall she thinks she is doing very well. The only other new issue is that she broke a small bone in the left foot but that has healed up very quickly.  Ms. [name] returns today for followup. She reports having some upper midepigastric pain but has not been taking her PPI regularly. Just reports not taking her cholesterol medication regularly. She doesn't be taken her blood pressure medicines and her blood pressure is well-controlled today.  I saw [name] back today in follow-up. She tells me that she is only a few days away from retirement. Unfortunately there'll be a 3-4 week delay in insurance coverage before she can start with her new state insurance. She was told that she could get insurance coverage from [place] however the cost would be more than $800. From a medical standpoint she seems to be doing well and is interested in quitting cigarettes. She got a prescription for Chantix  but has not yet started. Blood pressure is well-controlled. She has had good cholesterol control on Crestor.  03/26/2016  [name] returns today for follow-up. Blood pressure appears to be well-controlled. She denies any chest pain, but has had some worsening fatigue and dyspnea. She says she gets short of breath walking up stairs and has had recent decreased exercise tolerance.   03/25/2017  [name] was seen today in follow-up.  She recently won a trip to [place] and unfortunately fell and fractured 3 ribs.  She seems to be recovering from that.  She is not taking pain medicine regularly.  She has had some nausea and shortness of breath which is consistent.  She had a stress test last year which was negative for ischemia and showed normal LV function.  She denies any worsening or new symptoms associated with that.  04/06/2018  [name] is seen today in follow-up.  Overall she is doing well.  She denies any chest pain or worsening shortness of breath.  Her EKG, personally reviewed today shows some anteroseptal T wave inversions which are slightly worse than previously.  Despite this she is completely asymptomatic.  She is trying to work on smoking cessation.  She says she still struggles with sleep at night.  Of note her weight has been declining.  She is borderline underweight today.  Labs from June 2019 showed total cholesterol 140, HDL 56, LDL 66 and triglycerides 92.  Creatinine was normal.  04/09/2019  [name] returns today for follow-up.  Her weight is down a little more.  She continues  to smoke and recently has reported increasing productive cough and phlegm.  I was reviewing old records and indicated that she had a CT scan in 2012 of the chest which showed emphysematous disease and some pulmonary nodules.  I do not see any follow-up of that in our system nor even a chest x-ray.  Is not clear whether she has had follow-up with this with her primary care provider.  She continues to  smoke and her mother actually died of lung cancer.  She also told me that her brother recently died.  He has been in the hospital couple times with coronary disease and had a bypass.  Blood pressure is actually low today.  Recently she was seen by Doreene Adas, PA-C, who restarted Toprol.  Apparently she was not taking this medicine but restarted it however is also on amlodipine in combination telmisartan/HCTZ.  Blood pressure today is accordingly low.  PMHx:  Past Medical History:  Diagnosis Date  . Active smoker   . Anxiety   . Cervical dysplasia   . Dyslipidemia   . Endometriosis   . History of nuclear stress test 07/07/2010   dipyridamole; low risk, post-stress EF 65%  . Hypertension   . Migraine   . Osteopenia    08/2006 and 08/2008 Dexa Scans w Isaiah Blakes     Past Surgical History:  Procedure Laterality Date  . BACK SURGERY  2002   RUPTURED DISC   . CARDIAC CATHETERIZATION  11/20/2002   patent coronary arteries   . CHOLECYSTECTOMY  2006  . COLPOSCOPY    . GYNECOLOGIC CRYOSURGERY    . NOSE SURGERY     X2  . OOPHORECTOMY     BSO  . SHOULDER SURGERY     ROTATOR CUFF X 2  . THROAT SURGERY     REMOVAL OF TUMOR  . VAGINAL HYSTERECTOMY  2000   WITH BSO    FAMHx:  Family History  Problem Relation Age of Onset  . Hypertension Mother   . Cancer Mother        LIVER AND LUNG  . Hypertension Brother   . Heart disease Brother   . Hypertension Maternal Grandfather   . Heart disease Maternal Grandfather   . Heart attack Maternal Grandfather     SOCHx:   reports that she has been smoking cigarettes. She has a 42.00 pack-year smoking history. She has never used smokeless tobacco. She reports current alcohol use of about 6.0 - 8.0 standard drinks of alcohol per week. She reports that she does not use drugs.  ALLERGIES:  Allergies  Allergen Reactions  . Iodinated Diagnostic Agents Itching    itching but no hives just after iv contrast injection w/ 13 hr prep, future contrast  not advised unless absolutely necessary, 13 hr prep would still be advised.//a.calhoun  . Ioxaglate Itching    itching but no hives just after iv contrast injection w/ 13 hr prep, future contrast not advised unless absolutely necessary, 13 hr prep would still be advised.//a.calhoun  . Omnipaque [Iohexol] Anaphylaxis  . Buprenorphine Hcl Nausea And Vomiting  . Codeine Nausea And Vomiting  . Erythromycin   . Morphine And Related Nausea And Vomiting    ROS: Pertinent items noted in HPI and remainder of comprehensive ROS otherwise negative.  HOME MEDS: Current Outpatient Medications  Medication Sig Dispense Refill  . ALPRAZolam (XANAX) 1 MG tablet Take 1 mg by mouth 3 (three) times daily as needed for anxiety.    Marland Kitchen amLODipine (NORVASC) 5 MG  tablet Take 1 tablet (5 mg total) by mouth daily. 90 tablet 1  . aspirin EC 81 MG tablet Take 81 mg by mouth daily.    . cholestyramine (QUESTRAN) 4 GM/DOSE powder Take 4 g by mouth every other day.    . denosumab (PROLIA) 60 MG/ML SOLN injection Inject 60 mg into the skin every 6 (six) months. Administer in upper arm, thigh, or abdomen    . diphenhydrAMINE (BENADRYL) 25 MG tablet Take 25 mg by mouth every 6 (six) hours as needed.    . metoprolol succinate (TOPROL-XL) 50 MG 24 hr tablet Take 1 tablet (50 mg total) by mouth daily. Take with or immediately following a meal. 90 tablet 1  . montelukast (SINGULAIR) 10 MG tablet Take 10 mg by mouth daily.  0  . rosuvastatin (CRESTOR) 10 MG tablet Take 1 tablet (10 mg total) by mouth daily. 90 tablet 1  . telmisartan-hydrochlorothiazide (MICARDIS HCT) 80-12.5 MG tablet Take 1 tablet by mouth daily. 90 tablet 1  . temazepam (RESTORIL) 15 MG capsule Take 1 capsule by mouth at bedtime as needed for sleep.    . VENTOLIN HFA 108 (90 BASE) MCG/ACT inhaler as needed.    . Vitamin D, Ergocalciferol, (DRISDOL) 1.25 MG (50000 UT) CAPS capsule Take 1 capsule by mouth once a week.     No current facility-administered  medications for this visit.    LABS/IMAGING: No results found for this or any previous visit (from the past 48 hour(s)). No results found.  VITALS: BP 104/70   Pulse 85   Ht 5\' 4"  (1.626 m)   Wt 106 lb 12.8 oz (48.4 kg)   SpO2 92%   BMI 18.33 kg/m   EXAM: General appearance: alert and no distress Neck: no adenopathy, no carotid bruit, no JVD, supple, symmetrical, trachea midline and thyroid not enlarged, symmetric, no tenderness/mass/nodules Lungs: clear to auscultation bilaterally Heart: regular rate and rhythm, S1, S2 normal, no murmur, click, rub or gallop Abdomen: soft, non-tender; bowel sounds normal; no masses,  no organomegaly Extremities: extremities normal, atraumatic, no cyanosis or edema Pulses: 2+ and symmetric Skin: Skin color, texture, turgor normal. No rashes or lesions Neurologic: Grossly normal  EKG: Sinus rhythm with PACs at 85, inferolateral ST and T wave changes-personally reviewed  ASSESSMENT:  1. Dyspnea on exertion and fatigue-low risk nuclear stress test (03/2016) 2. Hypertension-controlled 3. Hyperlipidemia- on Crestor 4. Tobacco abuse 5. History of lung nodules/emphysema  PLAN: 1.   Mrs. Merrilee recently has an increase in phlegm and productive cough.  She has a history of lung nodules and emphysema seen on CT scan in 2012.  Is not clear whether she has had any recent follow-up of this.  I had like to repeat a CT scan for follow-up.  Her mother did die of lung cancer.  Additionally, her blood pressure is low today.  I advised her to decrease her Toprol-XL from 50 to 25 mg daily.  Follow-up annually or sooner as necessary.  Pixie Casino, MD, [facility], [place] Director of the [facility] Attending Cardiologist  Direct Dial: [phone]  Fax: [phone]  Website:  [email or web address] 04/09/2019, 4:12 PM

## 2019-04-09 NOTE — Patient Instructions (Addendum)
Medication Instructions:  Decrease metoprolol succinate to 25mg  daily  *If you need a refill on your cardiac medications before your next appointment, please call your pharmacy*  Lab Work: NONE If you have labs (blood work) drawn today and your tests are completely normal, you will receive your results only by: Marland Kitchen MyChart Message (if you have MyChart) OR . A paper copy in the mail If you have any lab test that is abnormal or we need to change your treatment, we will call you to review the results.  Testing/Procedures: Chest Ct @ [place]: At [facility], you and your health needs are our priority.  As part of our continuing mission to provide you with exceptional heart care, we have created designated Provider Care Teams.  These Care Teams include your primary Cardiologist (physician) and Advanced Practice Providers (APPs -  Physician Assistants and Nurse Practitioners) who all work together to provide you with the care you need, when you need it.  Your next appointment:   12 month(s)  The format for your next appointment:   In Person  Provider:   You may see Pixie Casino, MD or one of the following Advanced Practice Providers on your designated Care Team:    Almyra Deforest, PA-C  Fabian Sharp, PA-C or   Roby Lofts, [place]   Other Instructions

## 2019-04-13 ENCOUNTER — Telehealth: Payer: Self-pay | Admitting: Internal Medicine

## 2019-04-13 DIAGNOSIS — R05 Cough: Secondary | ICD-10-CM

## 2019-04-13 DIAGNOSIS — R634 Abnormal weight loss: Secondary | ICD-10-CM

## 2019-04-13 DIAGNOSIS — Z801 Family history of malignant neoplasm of trachea, bronchus and lung: Secondary | ICD-10-CM

## 2019-04-13 DIAGNOSIS — R059 Cough, unspecified: Secondary | ICD-10-CM

## 2019-04-13 DIAGNOSIS — R911 Solitary pulmonary nodule: Secondary | ICD-10-CM

## 2019-04-13 DIAGNOSIS — F17200 Nicotine dependence, unspecified, uncomplicated: Secondary | ICD-10-CM

## 2019-04-13 NOTE — Telephone Encounter (Signed)
Lung cancer screening CT.  Dr. Lemmie Evens

## 2019-04-13 NOTE — Telephone Encounter (Signed)
Hildred Alamin from [facility] was calling for clarification on the patient's CT orders for 12/22. Hildred Alamin wanted to know if the patient should have a  lung cancer screening based on family hx, or if Dr. Debara Pickett just wants to do a regular CT without Contrast.  If Hildred Alamin is not available, Avril will be able to help document any new orders.

## 2019-04-13 NOTE — Telephone Encounter (Signed)
Please advise on order

## 2019-04-13 NOTE — Telephone Encounter (Signed)
Order changed per MD

## 2019-04-17 ENCOUNTER — Other Ambulatory Visit: Payer: Medicare Other

## 2019-04-17 ENCOUNTER — Ambulatory Visit
Admission: RE | Admit: 2019-04-17 | Discharge: 2019-04-17 | Disposition: A | Discharge status: Home / Self Care | Payer: Medicare Other | Source: Ambulatory Visit | Attending: Internal Medicine | Admitting: Internal Medicine

## 2019-04-17 DIAGNOSIS — R911 Solitary pulmonary nodule: Secondary | ICD-10-CM

## 2019-04-17 DIAGNOSIS — R634 Abnormal weight loss: Secondary | ICD-10-CM

## 2019-04-17 DIAGNOSIS — F17200 Nicotine dependence, unspecified, uncomplicated: Secondary | ICD-10-CM

## 2019-04-17 DIAGNOSIS — R05 Cough: Secondary | ICD-10-CM

## 2019-04-17 DIAGNOSIS — Z801 Family history of malignant neoplasm of trachea, bronchus and lung: Secondary | ICD-10-CM

## 2019-04-17 DIAGNOSIS — R059 Cough, unspecified: Secondary | ICD-10-CM

## 2019-04-23 ENCOUNTER — Other Ambulatory Visit: Payer: Self-pay

## 2019-04-23 MED ORDER — ROSUVASTATIN CALCIUM 10 MG PO TABS: 10.0000 mg | ORAL_TABLET | Freq: Every day | ORAL | 1 refills | Status: DC

## 2019-04-24 ENCOUNTER — Other Ambulatory Visit: Payer: Self-pay | Admitting: Internal Medicine

## 2019-04-24 DIAGNOSIS — R911 Solitary pulmonary nodule: Secondary | ICD-10-CM

## 2019-04-26 ENCOUNTER — Other Ambulatory Visit: Payer: Self-pay | Admitting: *Deleted

## 2019-04-26 DIAGNOSIS — R911 Solitary pulmonary nodule: Secondary | ICD-10-CM

## 2019-04-26 NOTE — Progress Notes (Unsigned)
PET

## 2019-05-01 ENCOUNTER — Other Ambulatory Visit: Payer: Self-pay

## 2019-05-01 ENCOUNTER — Institutional Professional Consult (permissible substitution): Payer: Medicare PPO | Admitting: Thoracic Surgery (Cardiothoracic Vascular Surgery)

## 2019-05-01 ENCOUNTER — Encounter: Payer: Self-pay | Admitting: Thoracic Surgery (Cardiothoracic Vascular Surgery)

## 2019-05-01 VITALS — BP 136/91 | HR 98 | Temp 97.7°F | Resp 16 | Ht 64.0 in | Wt 106.0 lb

## 2019-05-01 DIAGNOSIS — I712 Thoracic aortic aneurysm, without rupture, unspecified: Secondary | ICD-10-CM

## 2019-05-01 DIAGNOSIS — D381 Neoplasm of uncertain behavior of trachea, bronchus and lung: Secondary | ICD-10-CM | POA: Diagnosis not present

## 2019-05-01 DIAGNOSIS — I700 Atherosclerosis of aorta: Secondary | ICD-10-CM

## 2019-05-01 NOTE — Progress Notes (Signed)
PCP is Jani Gravel, MD Referring Provider is Debara Pickett Nadean Corwin, MD  Chief Complaint  Patient presents with  . Lung Lesion    LULobe per lung cancer screening CT ....Marland KitchenPET is scheduled for 05/08/19    HPI: [name] is sent for consultation regarding a right upper lobe lung nodule.  [name] is a 69 year old woman with a history of tobacco abuse, COPD, anxiety, hypertension, dyslipidemia, migraine, and endometriosis.  She is followed by Dr. Debara Pickett for her hypertension.  She recently saw Dr. Debara Pickett.  In review of her chart he noted that she had a CT back in 2012 which showed some lung nodules.  Given her smoking history she was a candidate for low-dose CT screening.  He did a CT of the chest which showed a 1.3 cm mixed density nodule in the posterior lateral right upper lobe.  There was no hilar or mediastinal adenopathy.  She was also noted to have a 4.2 cm ascending aneurysm in the setting of aortic atherosclerosis.  She says that she has been very anxious lately.  She smokes a pack a day of cigarettes or less.  She started smoking when she was 17.  She has tried to quit but has not been able to do so.  She lives alone.  She says that she is lost about 5 pounds over the past 3 months.  She attributes that to not eating well during the Covid situation.  She denies any unusual headaches or visual changes.  She does get short of breath with heavy exertion but not with routine activities and says that she can walk up a flight of stairs without stopping.  She is not having any chest pain, pressure, or tightness.  Zubrod Score: At the time of surgery this patient's most appropriate activity status/level should be described as: []     0    Normal activity, no symptoms [x]     1    Restricted in physical strenuous activity but ambulatory, able to do out light work []     2    Ambulatory and capable of self care, unable to do work activities, up and about >50 % of waking hours                              []      3    Only limited self care, in bed greater than 50% of waking hours []     4    Completely disabled, no self care, confined to bed or chair []     5    Moribund  Past Medical History:  Diagnosis Date  . Active smoker   . Anxiety   . Cervical dysplasia   . Dyslipidemia   . Endometriosis   . History of nuclear stress test 07/07/2010   dipyridamole; low risk, post-stress EF 65%  . Hypertension   . Migraine   . Osteopenia    08/2006 and 08/2008 Dexa Scans w Isaiah Blakes     Past Surgical History:  Procedure Laterality Date  . BACK SURGERY  2002   RUPTURED DISC   . CARDIAC CATHETERIZATION  11/20/2002   patent coronary arteries   . CHOLECYSTECTOMY  2006  . COLPOSCOPY    . GYNECOLOGIC CRYOSURGERY    . NOSE SURGERY     X2  . OOPHORECTOMY     BSO  . SHOULDER SURGERY     ROTATOR CUFF X 2  . THROAT SURGERY  REMOVAL OF TUMOR  . VAGINAL HYSTERECTOMY  2000   WITH BSO    Family History  Problem Relation Age of Onset  . Hypertension Mother   . Cancer Mother        LIVER AND LUNG  . Hypertension Brother   . Heart disease Brother   . Hypertension Maternal Grandfather   . Heart disease Maternal Grandfather   . Heart attack Maternal Grandfather     Social History Social History   Tobacco Use  . Smoking status: Current Every Day Smoker    Packs/day: 1.00    Years: 42.00    Pack years: 42.00    Types: Cigarettes  . Smokeless tobacco: Never Used  Substance Use Topics  . Alcohol use: Yes    Alcohol/week: 6.0 - 8.0 standard drinks    Types: 6 - 8 Standard drinks or equivalent per week  . Drug use: No    Current Outpatient Medications  Medication Sig Dispense Refill  . amLODipine (NORVASC) 5 MG tablet Take 1 tablet (5 mg total) by mouth daily. 90 tablet 1  . aspirin EC 81 MG tablet Take 81 mg by mouth daily.    . cholestyramine (QUESTRAN) 4 GM/DOSE powder Take 4 g by mouth every other day.    . denosumab (PROLIA) 60 MG/ML SOLN injection Inject 60 mg into the skin every  6 (six) months. Administer in upper arm, thigh, or abdomen    . diphenhydrAMINE (BENADRYL) 25 MG tablet Take 25 mg by mouth every 6 (six) hours as needed.    . montelukast (SINGULAIR) 10 MG tablet Take 10 mg by mouth daily.  0  . rosuvastatin (CRESTOR) 10 MG tablet Take 1 tablet (10 mg total) by mouth daily. 90 tablet 1  . telmisartan-hydrochlorothiazide (MICARDIS HCT) 80-12.5 MG tablet Take 1 tablet by mouth daily. 90 tablet 1  . temazepam (RESTORIL) 15 MG capsule Take 1 capsule by mouth at bedtime as needed for sleep.    . VENTOLIN HFA 108 (90 BASE) MCG/ACT inhaler as needed.    . Vitamin D, Ergocalciferol, (DRISDOL) 1.25 MG (50000 UT) CAPS capsule Take 1 capsule by mouth once a week.    . ALPRAZolam (XANAX) 1 MG tablet Take 1 mg by mouth 3 (three) times daily as needed for anxiety.    . metoprolol succinate (TOPROL-XL) 25 MG 24 hr tablet Take 1 tablet (25 mg total) by mouth daily. Take with or immediately following a meal. (Patient not taking: Reported on 05/01/2019) 90 tablet 3   No current facility-administered medications for this visit.    Allergies  Allergen Reactions  . Iodinated Diagnostic Agents Itching    itching but no hives just after iv contrast injection w/ 13 hr prep, future contrast not advised unless absolutely necessary, 13 hr prep would still be advised.//a.calhoun  . Ioxaglate Itching    itching but no hives just after iv contrast injection w/ 13 hr prep, future contrast not advised unless absolutely necessary, 13 hr prep would still be advised.//a.calhoun  . Omnipaque [Iohexol] Anaphylaxis  . Buprenorphine Hcl Nausea And Vomiting  . Codeine Nausea And Vomiting  . Erythromycin   . Morphine And Related Nausea And Vomiting    Review of Systems  Constitutional: Positive for unexpected weight change (Has lost 5 pounds). Negative for activity change and appetite change.  HENT: Positive for trouble swallowing. Negative for voice change.   Eyes: Negative for visual  disturbance.  Respiratory: Positive for cough (Chronic, productive, clear sputum) and shortness of breath (  With heavy exertion).   Cardiovascular: Negative for chest pain and leg swelling.  Gastrointestinal: Positive for abdominal pain (Reflux) and diarrhea.  Genitourinary: Negative for difficulty urinating and dysuria.  Musculoskeletal: Positive for myalgias (Leg cramps).  Neurological: Negative for seizures and syncope.       History of TIA 7 years ago  Hematological: Negative for adenopathy. Bruises/bleeds easily.  Psychiatric/Behavioral:       Stress    BP (!) 136/91 (BP Location: Left Arm, Patient Position: Sitting, Cuff Size: Normal)   Pulse 98   Temp 97.7 F (36.5 C)   Resp 16   Ht 5\' 4"  (1.626 m)   Wt 106 lb (48.1 kg)   SpO2 93% Comment: RA  BMI 18.19 kg/m  Physical Exam Vitals reviewed.  Constitutional:      General: She is not in acute distress. HENT:     Head: Normocephalic and atraumatic.  Eyes:     General: No scleral icterus.    Extraocular Movements: Extraocular movements intact.  Cardiovascular:     Rate and Rhythm: Normal rate and regular rhythm.     Heart sounds: Normal heart sounds. No murmur. No friction rub. No gallop.   Pulmonary:     Effort: Pulmonary effort is normal. No respiratory distress.     Breath sounds: No wheezing or rales.  Abdominal:     General: There is no distension.     Palpations: Abdomen is soft.     Tenderness: There is no abdominal tenderness.  Musculoskeletal:        General: No swelling.     Cervical back: Neck supple.  Lymphadenopathy:     Cervical: No cervical adenopathy.  Skin:    General: Skin is warm and dry.  Neurological:     General: No focal deficit present.     Mental Status: She is alert and oriented to person, place, and time.     Cranial Nerves: No cranial nerve deficit.    Diagnostic Tests: CT CHEST WITHOUT CONTRAST LOW-DOSE FOR LUNG CANCER SCREENING  TECHNIQUE: Multidetector CT imaging of the chest  was performed following the standard protocol without IV contrast.  COMPARISON:  CT chest dated 06/29/2010  FINDINGS: Cardiovascular: The heart is normal in size. No pericardial effusion.  4.2 cm ascending thoracic aortic aneurysm, unchanged. Atherosclerotic calcifications of the aortic arch.  Three vessel coronary atherosclerosis.  Mediastinum/Nodes: No suspicious mediastinal lymphadenopathy.  Visualized thyroid is unremarkable.  Lungs/Pleura: Moderate centrilobular and paraseptal emphysematous changes, upper lung predominant.  Biapical pleural-parenchymal scarring, right greater than left.  No focal consolidation. Mild scarring/atelectasis in the medial right lower lobe.  13.1 mm solid/subsolid subpleural nodule in the posterolateral right upper lobe (image 61), new from 2012, suspicious.  No pleural effusion or pneumothorax.  Upper Abdomen: Visualized upper abdomen is notable for prior cholecystectomy and vascular calcifications.  Musculoskeletal: Visualized osseous structures are within normal limits.  IMPRESSION: Lung-RADS 4A, suspicious. Follow up low-dose chest CT without contrast in 3 months (please use the following order, "CT CHEST LCS NODULE FOLLOW-UP W/O CM") is recommended. Alternatively, PET could be considered.  13.1 mm solid/subsolid subpleural nodule in the posterior left upper lobe.  Stable 4.2 cm ascending thoracic aortic aneurysm. In this patient, attention on follow-up is satisfactory. This recommendation follows 2010 ACCF/AHA/AATS/ACR/ASA/SCA/SCAI/SIR/STS/SVM Guidelines for the Diagnosis and Management of Patients with Thoracic Aortic Disease. Circulation. 2010; 121: D176-H607. Aortic aneurysm NOS (ICD10-I71.9)  Aortic Atherosclerosis (ICD10-I70.0) and Emphysema (ICD10-J43.9).   Electronically Signed   By: Henderson Newcomer.D.  On: 04/18/2019 12:03 I personally reviewed the CT images and concur with the findings  noted above  Impression: Trinaty Bundrick is a 70 year old woman with a history of hypertension, hyperlipidemia, tobacco abuse, COPD, anxiety, migraines, and endometriosis.  She recently had a CT for lung cancer screening which revealed emphysema, a 1.3 cm mixed density nodule in the right upper lobe, a 4.2 cm ascending aneurysm, and thoracic aortic atherosclerosis.  Lung nodule-obviously primary concern is lung cancer given her age and smoking history.  The nodule is mixed density.  Infectious and inflammatory nodules are also in the differential diagnosis.  This nodule is really a toss up in a PET/CT will be vital in helping [place] determine the initial diagnostic work-up.  This nodule is really not favorable for percutaneous or bronchoscopic biopsy.  Really I think the options fall to surgical resection versus radiographic follow-up.  The PET will help determine which of those is the better option for her.  Both she and her son understand that the PET is not definitive one way or the other in regards to cancer, it only provides additional information to help [place] make our decision.  They understand the relative advantages and disadvantages to an aggressive versus a conservative approach.  We did have a discussion about the surgical approach which in her case would be a robotic VATS approach.  That is minimally invasive.  We would do a wedge resection followed by a frozen section intraoperatively.  Frozen section will determine whether a lobectomy were necessary.  Based on her history I think she could tolerate a lobectomy.  It may be difficult to get pulmonary function testing.  If it looks like we will need to do surgery, then we will plan to do a 6-minute walk test on her.  Ascending aneurysm/aortic atherosclerosis-4.2 cm ascending aneurysm with calcification.  Emphasized the importance of tobacco cessation.  Emphasized the importance of blood pressure control.  Will need annual follow-up.  Tobacco  abuse-discussed the importance of cessation.  She understands the issues involved, but unfortunately has been unable to quit at this point in time.  Plan: PET/CT next week Return after PET/CT to discuss surgery versus radiographic follow-up.  Melrose Nakayama, MD [facility] [phone]

## 2019-05-08 ENCOUNTER — Ambulatory Visit (HOSPITAL_COMMUNITY): Payer: Medicare Other

## 2019-05-08 ENCOUNTER — Other Ambulatory Visit: Payer: Self-pay

## 2019-05-08 ENCOUNTER — Ambulatory Visit (HOSPITAL_COMMUNITY)
Admission: RE | Admit: 2019-05-08 | Discharge: 2019-05-08 | Disposition: A | Discharge status: Home / Self Care | Payer: Medicare PPO | Source: Ambulatory Visit | Attending: Thoracic Surgery (Cardiothoracic Vascular Surgery) | Admitting: Thoracic Surgery (Cardiothoracic Vascular Surgery)

## 2019-05-08 DIAGNOSIS — I2510 Atherosclerotic heart disease of native coronary artery without angina pectoris: Secondary | ICD-10-CM | POA: Insufficient documentation

## 2019-05-08 DIAGNOSIS — I700 Atherosclerosis of aorta: Secondary | ICD-10-CM | POA: Insufficient documentation

## 2019-05-08 DIAGNOSIS — I712 Thoracic aortic aneurysm, without rupture: Secondary | ICD-10-CM | POA: Insufficient documentation

## 2019-05-08 DIAGNOSIS — J439 Emphysema, unspecified: Secondary | ICD-10-CM | POA: Diagnosis not present

## 2019-05-08 DIAGNOSIS — R911 Solitary pulmonary nodule: Secondary | ICD-10-CM | POA: Diagnosis present

## 2019-05-08 LAB — GLUCOSE, CAPILLARY: Glucose-Capillary: 110 mg/dL — ABNORMAL HIGH (ref 70–99)

## 2019-05-08 MED ORDER — FLUDEOXYGLUCOSE F - 18 (FDG) INJECTION
5.0000 | Freq: Once | INTRAVENOUS | Status: AC | PRN
  Administered 2019-05-08: 5 via INTRAVENOUS

## 2019-05-09 ENCOUNTER — Encounter: Payer: Self-pay | Admitting: *Deleted

## 2019-05-09 ENCOUNTER — Ambulatory Visit: Payer: Medicare PPO | Admitting: Thoracic Surgery (Cardiothoracic Vascular Surgery)

## 2019-05-09 ENCOUNTER — Other Ambulatory Visit: Payer: Self-pay | Admitting: *Deleted

## 2019-05-09 VITALS — BP 119/65 | HR 85 | Temp 97.7°F | Resp 20 | Ht 64.0 in | Wt 106.0 lb

## 2019-05-09 DIAGNOSIS — R911 Solitary pulmonary nodule: Secondary | ICD-10-CM

## 2019-05-09 NOTE — Progress Notes (Signed)
Six minute walk test performed. Beginning VS @ 1610: Resp 20, HR 85, sp02 94% on RA. Pt denies dyspnea or fatigue prior to beginning test. Pt walks 9.5 laps=931 feet in 6 minutes without difficulty. She denies dyspnea or fatigue during and after walking. VS @ 1616: Resp 24, HR 89, spO2 94% on RA.

## 2019-05-09 NOTE — H&P (View-Only) (Signed)
[address]       [place] [address]             [phone]     HPI: [name] returns to discussed the results of [name] PET/CT  [name] is a 69 year old woman with a history of tobacco abuse, COPD, anxiety, hypertension, dyslipidemia, endometriosis, and migraines.  She recently saw Dr. Debara Pickett regarding [name] hypertension.  In reviewing [name] chart he noted she had a lung nodules on a CT back in 2012.  Given [name] smoking history she was a candidate for low-dose CT screening.  A CT of the chest showed a 1.3 cm mixed density nodule in the posterior lateral right upper lobe.  There was no hilar or mediastinal adenopathy.  I saw [name] in the office last week.  She was felt to be a good candidate for surgical resection but [name] PET scan has not yet been done.  She now returns to discuss the results of the PET and further decide how she would like to proceed.  Past Medical History:  Diagnosis Date  . Active smoker   . Anxiety   . Cervical dysplasia   . Dyslipidemia   . Endometriosis   . History of nuclear stress test 07/07/2010   dipyridamole; low risk, post-stress EF 65%  . Hypertension   . Migraine   . Osteopenia    08/2006 and 08/2008 Dexa Scans w [name]     Current Outpatient Medications  Medication Sig Dispense Refill  . ALPRAZolam (XANAX) 1 MG tablet Take 1 mg by mouth 3 (three) times daily as needed for anxiety.    Marland Kitchen amLODipine (NORVASC) 5 MG tablet Take 1 tablet (5 mg total) by mouth daily. 90 tablet 1  . aspirin EC 81 MG tablet Take 81 mg by mouth daily.    . cholestyramine (QUESTRAN) 4 GM/DOSE powder Take 4 g by mouth every other day.    . denosumab (PROLIA) 60 MG/ML SOLN injection Inject 60 mg into the skin every 6 (six) months. Administer in upper arm, thigh, or abdomen    . diphenhydrAMINE (BENADRYL) 25 MG tablet Take 25 mg by mouth every 6 (six) hours as needed.    . metoprolol succinate (TOPROL-XL) 25 MG 24 hr tablet Take 1 tablet (25 mg total) by  mouth daily. Take with or immediately following a meal. 90 tablet 3  . montelukast (SINGULAIR) 10 MG tablet Take 10 mg by mouth daily.  0  . rosuvastatin (CRESTOR) 10 MG tablet Take 1 tablet (10 mg total) by mouth daily. 90 tablet 1  . telmisartan-hydrochlorothiazide (MICARDIS HCT) 80-12.5 MG tablet Take 1 tablet by mouth daily. 90 tablet 1  . temazepam (RESTORIL) 15 MG capsule Take 1 capsule by mouth at bedtime as needed for sleep.    . VENTOLIN HFA 108 (90 BASE) MCG/ACT inhaler as needed.    . Vitamin D, Ergocalciferol, (DRISDOL) 1.25 MG (50000 UT) CAPS capsule Take 1 capsule by mouth once a week.     No current facility-administered medications for this visit.    Physical Exam BP 119/65 (BP Location: Right Arm, Patient Position: Sitting, Cuff Size: Normal)   Pulse 85   Temp 97.7 F (36.5 C) (Skin)   Resp 20   Ht 5\' 4"  (1.626 m)   Wt 106 lb (48.1 kg)   SpO2 91% Comment: RA  BMI 18.83 kg/m  69 year old woman in no acute distress Alert and oriented x3 with no focal deficits Lungs clear  Cardiac regular rate and rhythm No edema See note from 05/01/2019 for complete exam.  Diagnostic Tests: NUCLEAR MEDICINE PET SKULL BASE TO THIGH  TECHNIQUE: 5.0 mCi F-18 FDG was injected intravenously. Full-ring PET imaging was performed from the skull base to thigh after the radiotracer. CT data was obtained and used for attenuation correction and anatomic localization.  Fasting blood glucose: 110 mg/dl  COMPARISON:  Lung cancer screening CT of 04/17/2019. Abdominopelvic CT 10/11/2016.  FINDINGS: Mediastinal blood pool activity: SUV max 2.1  Liver activity: SUV max NA  NECK: No areas of abnormal hypermetabolism.  Incidental CT findings: No cervical adenopathy. Bilateral carotid atherosclerosis.  CHEST: Hypermetabolism which corresponds to the area of pleural-based posterior right upper lobe nodularity. Example of 1.3 cm and a S.U.V. max of 2.4 on 18/8.  No thoracic nodal  hypermetabolism.  Incidental CT findings: Advanced bullous type emphysema. Aortic and coronary artery atherosclerosis. Ascending aortic aneurysm including at 4.1 cm.  ABDOMEN/PELVIS: No abdominopelvic parenchymal or nodal hypermetabolism.  Incidental CT findings: Normal adrenal glands. Cholecystectomy. Bilateral low-density renal lesions which are likely cysts. Abdominal aortic atherosclerosis. Hysterectomy. Pelvic floor laxity.  SKELETON: Hypermetabolism corresponding to a non healed, likely subacute right posterolateral tenth rib fracture on 95/4. There is adjacent right posterior eleventh rib deformity which is likely related to remote trauma.  Focus of hypermetabolism about the anterior superior most aspect of the right first rib and adjacent medial right clavicle, without well-defined CT correlate. This measures a S.U.V. max of 5.4.  Incidental CT findings: none  IMPRESSION: 1. Mild hypermetabolism corresponding to the posterior right upper lobe nodular opacity. Although this could represent an area of post infectious/inflammatory scarring, indolent neoplasm cannot be excluded. 2. No hypermetabolic thoracic nodes. 3. Right tenth rib likely subacute non healed fracture with hypermetabolism. Hypermetabolism within either the anterior first right rib or adjacent medial right clavicle is without CT correlate, but also favored to be posttraumatic. 4. Incidental findings, including: Aortic atherosclerosis (ICD10-I70.0), coronary artery atherosclerosis and emphysema (ICD10-J43.9). Ascending aortic aneurysm of 4.1 cm.   Electronically Signed   By: Abigail Miyamoto [name]   On: 05/08/2019 15:08 I personally reviewed the PET/CT images and concur with the findings noted above.  Right 10th rib fracture consistent with recent trauma.  Impression: [name] is a 69 year old woman with a history of hypertension, hyperlipidemia, tobacco abuse, COPD, anxiety, endometriosis,  and migraines.  She was found to have a 1.3 cm mixed density nodule in the right upper lobe on a low-dose CT for lung cancer screening.  That nodule is hypermetabolic on PET/CT with an SUV of 2.4.  These findings are highly suspicious for a stage Ia (T1, N0) non-small cell lung cancer.  Infectious and inflammatory nodules are also in the differential but are less likely given [name] age, smoking history, appearance of the nodule, and activity on PET/CT.  My index of suspicion is high enough that a negative CT-guided or bronchoscopic biopsy would not be sufficient to rule out cancer.  I recommended to [name] and [name] son that we proceed with a robotic right VATS for wedge resection to be followed by right upper lobectomy if the intraoperative frozen section is positive for cancer.  If the lesion is benign we would not need to do any additional resection.  I described the general nature of the procedure to them.  They understand this would be a minimally invasive approach requiring multiple small incisions.  We would plan to do the procedure under general anesthesia.  We  discussed the use of drains to postoperatively, the expected hospital stay, and the overall recovery.  I informed him of the indications, risks, benefits, and alternatives.  They understand the risks include, but are not limited to death, MI, DVT, PE, bleeding, possible need for transfusion, infection, prolonged air leak, cardiac arrhythmias, as well as the possibility of other unforeseeable complications.  6-minute walk test was done in the office.  She tolerated that well walking 935 feet with no desaturation or tachycardia.  This would indicate adequate pulmonary reserve to tolerate a lobectomy.  Plan: Robotic right VATS for wedge resection and possible right upper lobectomy on Monday, 05/28/2019  Melrose Nakayama, MD [facility] [phone]

## 2019-05-09 NOTE — Progress Notes (Signed)
[address]       [place] [address]             [phone]     HPI: [name] returns to discussed the results of her PET/CT  [name] is a 69 year old woman with a history of tobacco abuse, COPD, anxiety, hypertension, dyslipidemia, endometriosis, and migraines.  She recently saw Dr. Debara Pickett regarding her hypertension.  In reviewing her chart he noted she had a lung nodules on a CT back in 2012.  Given her smoking history she was a candidate for low-dose CT screening.  A CT of the chest showed a 1.3 cm mixed density nodule in the posterior lateral right upper lobe.  There was no hilar or mediastinal adenopathy.  I saw her in the office last week.  She was felt to be a good candidate for surgical resection but her PET scan has not yet been done.  She now returns to discuss the results of the PET and further decide how she would like to proceed.  Past Medical History:  Diagnosis Date  . Active smoker   . Anxiety   . Cervical dysplasia   . Dyslipidemia   . Endometriosis   . History of nuclear stress test 07/07/2010   dipyridamole; low risk, post-stress EF 65%  . Hypertension   . Migraine   . Osteopenia    08/2006 and 08/2008 Dexa Scans w Isaiah Blakes     Current Outpatient Medications  Medication Sig Dispense Refill  . ALPRAZolam (XANAX) 1 MG tablet Take 1 mg by mouth 3 (three) times daily as needed for anxiety.    Marland Kitchen amLODipine (NORVASC) 5 MG tablet Take 1 tablet (5 mg total) by mouth daily. 90 tablet 1  . aspirin EC 81 MG tablet Take 81 mg by mouth daily.    . cholestyramine (QUESTRAN) 4 GM/DOSE powder Take 4 g by mouth every other day.    . denosumab (PROLIA) 60 MG/ML SOLN injection Inject 60 mg into the skin every 6 (six) months. Administer in upper arm, thigh, or abdomen    . diphenhydrAMINE (BENADRYL) 25 MG tablet Take 25 mg by mouth every 6 (six) hours as needed.    . metoprolol succinate (TOPROL-XL) 25 MG 24 hr tablet Take 1 tablet (25 mg total) by  mouth daily. Take with or immediately following a meal. 90 tablet 3  . montelukast (SINGULAIR) 10 MG tablet Take 10 mg by mouth daily.  0  . rosuvastatin (CRESTOR) 10 MG tablet Take 1 tablet (10 mg total) by mouth daily. 90 tablet 1  . telmisartan-hydrochlorothiazide (MICARDIS HCT) 80-12.5 MG tablet Take 1 tablet by mouth daily. 90 tablet 1  . temazepam (RESTORIL) 15 MG capsule Take 1 capsule by mouth at bedtime as needed for sleep.    . VENTOLIN HFA 108 (90 BASE) MCG/ACT inhaler as needed.    . Vitamin D, Ergocalciferol, (DRISDOL) 1.25 MG (50000 UT) CAPS capsule Take 1 capsule by mouth once a week.     No current facility-administered medications for this visit.    Physical Exam BP 119/65 (BP Location: Right Arm, Patient Position: Sitting, Cuff Size: Normal)   Pulse 85   Temp 97.7 F (36.5 C) (Skin)   Resp 20   Ht 5\' 4"  (1.626 m)   Wt 106 lb (48.1 kg)   SpO2 91% Comment: RA  BMI 18.37 kg/m  69 year old woman in no acute distress Alert and oriented x3 with no focal deficits Lungs clear  Cardiac regular rate and rhythm No edema See note from 05/01/2019 for complete exam.  Diagnostic Tests: NUCLEAR MEDICINE PET SKULL BASE TO THIGH  TECHNIQUE: 5.0 mCi F-18 FDG was injected intravenously. Full-ring PET imaging was performed from the skull base to thigh after the radiotracer. CT data was obtained and used for attenuation correction and anatomic localization.  Fasting blood glucose: 110 mg/dl  COMPARISON:  Lung cancer screening CT of 04/17/2019. Abdominopelvic CT 10/11/2016.  FINDINGS: Mediastinal blood pool activity: SUV max 2.1  Liver activity: SUV max NA  NECK: No areas of abnormal hypermetabolism.  Incidental CT findings: No cervical adenopathy. Bilateral carotid atherosclerosis.  CHEST: Hypermetabolism which corresponds to the area of pleural-based posterior right upper lobe nodularity. Example of 1.3 cm and a S.U.V. max of 2.4 on 18/8.  No thoracic nodal  hypermetabolism.  Incidental CT findings: Advanced bullous type emphysema. Aortic and coronary artery atherosclerosis. Ascending aortic aneurysm including at 4.1 cm.  ABDOMEN/PELVIS: No abdominopelvic parenchymal or nodal hypermetabolism.  Incidental CT findings: Normal adrenal glands. Cholecystectomy. Bilateral low-density renal lesions which are likely cysts. Abdominal aortic atherosclerosis. Hysterectomy. Pelvic floor laxity.  SKELETON: Hypermetabolism corresponding to a non healed, likely subacute right posterolateral tenth rib fracture on 95/4. There is adjacent right posterior eleventh rib deformity which is likely related to remote trauma.  Focus of hypermetabolism about the anterior superior most aspect of the right first rib and adjacent medial right clavicle, without well-defined CT correlate. This measures a S.U.V. max of 5.4.  Incidental CT findings: none  IMPRESSION: 1. Mild hypermetabolism corresponding to the posterior right upper lobe nodular opacity. Although this could represent an area of post infectious/inflammatory scarring, indolent neoplasm cannot be excluded. 2. No hypermetabolic thoracic nodes. 3. Right tenth rib likely subacute non healed fracture with hypermetabolism. Hypermetabolism within either the anterior first right rib or adjacent medial right clavicle is without CT correlate, but also favored to be posttraumatic. 4. Incidental findings, including: Aortic atherosclerosis (ICD10-I70.0), coronary artery atherosclerosis and emphysema (ICD10-J43.9). Ascending aortic aneurysm of 4.1 cm.   Electronically Signed   By: Abigail Miyamoto [name]   On: 05/08/2019 15:08 I personally reviewed the PET/CT images and concur with the findings noted above.  Right 10th rib fracture consistent with recent trauma.  Impression: [name] is a 69 year old woman with a history of hypertension, hyperlipidemia, tobacco abuse, COPD, anxiety, endometriosis,  and migraines.  She was found to have a 1.3 cm mixed density nodule in the right upper lobe on a low-dose CT for lung cancer screening.  That nodule is hypermetabolic on PET/CT with an SUV of 2.4.  These findings are highly suspicious for a stage Ia (T1, N0) non-small cell lung cancer.  Infectious and inflammatory nodules are also in the differential but are less likely given her age, smoking history, appearance of the nodule, and activity on PET/CT.  My index of suspicion is high enough that a negative CT-guided or bronchoscopic biopsy would not be sufficient to rule out cancer.  I recommended to Mrs. Ellender and her son that we proceed with a robotic right VATS for wedge resection to be followed by right upper lobectomy if the intraoperative frozen section is positive for cancer.  If the lesion is benign we would not need to do any additional resection.  I described the general nature of the procedure to them.  They understand this would be a minimally invasive approach requiring multiple small incisions.  We would plan to do the procedure under general anesthesia.  We  discussed the use of drains to postoperatively, the expected hospital stay, and the overall recovery.  I informed him of the indications, risks, benefits, and alternatives.  They understand the risks include, but are not limited to death, MI, DVT, PE, bleeding, possible need for transfusion, infection, prolonged air leak, cardiac arrhythmias, as well as the possibility of other unforeseeable complications.  6-minute walk test was done in the office.  She tolerated that well walking 935 feet with no desaturation or tachycardia.  This would indicate adequate pulmonary reserve to tolerate a lobectomy.  Plan: Robotic right VATS for wedge resection and possible right upper lobectomy on Monday, 05/28/2019  Melrose Nakayama, MD [facility] [phone]

## 2019-05-14 ENCOUNTER — Encounter: Payer: Self-pay | Admitting: Thoracic Surgery (Cardiothoracic Vascular Surgery)

## 2019-05-20 ENCOUNTER — Encounter: Payer: Self-pay | Admitting: Thoracic Surgery (Cardiothoracic Vascular Surgery)

## 2019-05-21 ENCOUNTER — Other Ambulatory Visit: Payer: Self-pay

## 2019-05-21 MED ORDER — TELMISARTAN-HCTZ 80-12.5 MG PO TABS: 1.0000 | ORAL_TABLET | Freq: Every day | ORAL | 1 refills | Status: DC

## 2019-05-23 NOTE — Progress Notes (Signed)
[facility] [place], [place] [address] AT [address] [place] [address] Phone: [phone] Fax: [phone]      Your procedure is scheduled on Monday May 28, 2019.  Report to [facility] Main Entrance "A" at 05:30 A.M., and check in at the Admitting office.  Call this number if you have problems the morning of surgery:  [phone]   Call [phone] if you have any questions prior to your surgery date Monday-Friday 8am-4pm    Remember:  Do not eat or drink after midnight the night before your surgery  Take these medicines the morning of surgery with A SIP OF WATER: Amlodipine (NORVASC) Cholestyramine (QUESTRAN) Metoprolol succinate (TOPROL-XL) Rosuvastatin (CRESTOR) Ventolin HFA inhaler - as needed  **Please bring all inhalers with you the day of surgery.    As of today, STOP taking any Aleve, Naproxen, Ibuprofen, Motrin, Advil, Goody's, BC's, all herbal medications, fish oil, and all vitamins.   Follow your surgeon's instructions on when to stop Aspirin.  If no instructions were given by your surgeon then you will need to call the office to get those instructions.      The Morning of Surgery  Do not wear jewelry, make-up or nail polish.  Do not wear lotions, powders, perfumes, or deodorant  Do not shave 48 hours prior to surgery.   Do not bring valuables to the hospital.  [facility] is not responsible for any belongings or valuables.  If you are a smoker, DO NOT Smoke 24 hours prior to surgery  If you wear a CPAP at night please bring your mask the morning of surgery   Remember that you must have someone to transport you home after your surgery, and remain with you for 24 hours if you are discharged the same day.   Please bring cases for contacts, glasses, hearing aids, dentures or bridgework because it cannot be worn into surgery.    Leave your suitcase in the car.  After surgery it  may be brought to your room.  For patients admitted to the hospital, discharge time will be determined by your treatment team.  Patients discharged the day of surgery will not be allowed to drive home.    Special instructions:   [facility]- Preparing For Surgery  Before surgery, you can play an important role. Because skin is not sterile, your skin needs to be as free of germs as possible. You can reduce the number of germs on your skin by washing with CHG (chlorahexidine gluconate) Soap before surgery.  CHG is an antiseptic cleaner which kills germs and bonds with the skin to continue killing germs even after washing.    Oral Hygiene is also important to reduce your risk of infection.  Remember - BRUSH YOUR TEETH THE MORNING OF SURGERY WITH YOUR REGULAR TOOTHPASTE  Please do not use if you have an allergy to CHG or antibacterial soaps. If your skin becomes reddened/irritated stop using the CHG.  Do not shave (including legs and underarms) for at least 48 hours prior to first CHG shower. It is OK to shave your face.  Please follow these instructions carefully.   1. Shower the NIGHT BEFORE SURGERY and the MORNING OF SURGERY with CHG Soap.   2. If you chose to wash your hair, wash your hair first as usual with your normal shampoo.  3. After you shampoo, rinse your hair and body thoroughly to remove the shampoo.  4. Use CHG as you would any other liquid soap. You can apply CHG directly to the skin and wash gently with a scrungie or a clean washcloth.   5. Apply the CHG Soap to your body ONLY FROM THE NECK DOWN.  Do not use on open wounds or open sores. Avoid contact with your eyes, ears, mouth and genitals (private parts). Wash Face and genitals (private parts)  with your normal soap.   6. Wash thoroughly, paying special attention to the area where your surgery will be performed.  7. Thoroughly rinse your body with warm water from the neck down.  8. DO NOT shower/wash with your normal  soap after using and rinsing off the CHG Soap.  9. Pat yourself dry with a CLEAN TOWEL.  10. Wear CLEAN PAJAMAS to bed the night before surgery, wear comfortable clothes the morning of surgery  11. Place CLEAN SHEETS on your bed the night of your first shower and DO NOT SLEEP WITH PETS.    Day of Surgery:  Please shower the morning of surgery with the CHG soap Do not apply any deodorants/lotions. Please wear clean clothes to the hospital/surgery center.   Remember to brush your teeth WITH YOUR REGULAR TOOTHPASTE.   Please read over the following fact sheets that you were given.

## 2019-05-24 ENCOUNTER — Encounter (HOSPITAL_COMMUNITY)
Admission: RE | Admit: 2019-05-24 | Discharge: 2019-05-24 | Disposition: A | Discharge status: Home / Self Care | Payer: Medicare PPO | Source: Ambulatory Visit | Attending: Thoracic Surgery (Cardiothoracic Vascular Surgery) | Admitting: Thoracic Surgery (Cardiothoracic Vascular Surgery)

## 2019-05-24 ENCOUNTER — Other Ambulatory Visit (HOSPITAL_COMMUNITY)
Admission: RE | Admit: 2019-05-24 | Discharge: 2019-05-24 | Disposition: A | Discharge status: Home / Self Care | Payer: Medicare PPO | Source: Ambulatory Visit | Attending: Thoracic Surgery (Cardiothoracic Vascular Surgery) | Admitting: Thoracic Surgery (Cardiothoracic Vascular Surgery)

## 2019-05-24 ENCOUNTER — Ambulatory Visit (HOSPITAL_COMMUNITY)
Admission: RE | Admit: 2019-05-24 | Discharge: 2019-05-24 | Disposition: A | Discharge status: Home / Self Care | Payer: Medicare PPO | Source: Ambulatory Visit | Attending: Thoracic Surgery (Cardiothoracic Vascular Surgery) | Admitting: Thoracic Surgery (Cardiothoracic Vascular Surgery)

## 2019-05-24 ENCOUNTER — Other Ambulatory Visit: Payer: Self-pay

## 2019-05-24 ENCOUNTER — Encounter (HOSPITAL_COMMUNITY): Payer: Self-pay

## 2019-05-24 DIAGNOSIS — Z20822 Contact with and (suspected) exposure to covid-19: Secondary | ICD-10-CM | POA: Insufficient documentation

## 2019-05-24 DIAGNOSIS — Z01818 Encounter for other preprocedural examination: Secondary | ICD-10-CM | POA: Insufficient documentation

## 2019-05-24 DIAGNOSIS — Z7982 Long term (current) use of aspirin: Secondary | ICD-10-CM | POA: Diagnosis not present

## 2019-05-24 DIAGNOSIS — R911 Solitary pulmonary nodule: Secondary | ICD-10-CM

## 2019-05-24 DIAGNOSIS — E785 Hyperlipidemia, unspecified: Secondary | ICD-10-CM | POA: Insufficient documentation

## 2019-05-24 DIAGNOSIS — Z7901 Long term (current) use of anticoagulants: Secondary | ICD-10-CM | POA: Diagnosis not present

## 2019-05-24 DIAGNOSIS — Z79899 Other long term (current) drug therapy: Secondary | ICD-10-CM | POA: Insufficient documentation

## 2019-05-24 DIAGNOSIS — J449 Chronic obstructive pulmonary disease, unspecified: Secondary | ICD-10-CM | POA: Diagnosis not present

## 2019-05-24 DIAGNOSIS — F17200 Nicotine dependence, unspecified, uncomplicated: Secondary | ICD-10-CM | POA: Diagnosis not present

## 2019-05-24 DIAGNOSIS — I10 Essential (primary) hypertension: Secondary | ICD-10-CM | POA: Diagnosis not present

## 2019-05-24 HISTORY — DX: Nausea with vomiting, unspecified: R11.2

## 2019-05-24 HISTORY — DX: Other complications of anesthesia, initial encounter: T88.59XA

## 2019-05-24 HISTORY — DX: Sleep apnea, unspecified: G47.30

## 2019-05-24 HISTORY — DX: Nausea with vomiting, unspecified: Z98.890

## 2019-05-24 HISTORY — DX: Pneumonia, unspecified organism: J18.9

## 2019-05-24 LAB — PROTIME-INR
INR: 0.9 (ref 0.8–1.2)
Prothrombin Time: 11.6 seconds (ref 11.4–15.2)

## 2019-05-24 LAB — URINALYSIS, ROUTINE W REFLEX MICROSCOPIC
Bilirubin Urine: NEGATIVE
Glucose, UA: NEGATIVE mg/dL
Hgb urine dipstick: NEGATIVE
Ketones, ur: 20 mg/dL — AB
Nitrite: NEGATIVE
Protein, ur: NEGATIVE mg/dL
Specific Gravity, Urine: 1.018 (ref 1.005–1.030)
pH: 5 (ref 5.0–8.0)

## 2019-05-24 LAB — CBC
HCT: 44.9 % (ref 36.0–46.0)
Hemoglobin: 15.7 g/dL — ABNORMAL HIGH (ref 12.0–15.0)
MCH: 34.1 pg — ABNORMAL HIGH (ref 26.0–34.0)
MCHC: 35 g/dL (ref 30.0–36.0)
MCV: 97.4 fL (ref 80.0–100.0)
Platelets: 218 10*3/uL (ref 150–400)
RBC: 4.61 MIL/uL (ref 3.87–5.11)
RDW: 12.6 % (ref 11.5–15.5)
WBC: 9.3 10*3/uL (ref 4.0–10.5)
nRBC: 0 % (ref 0.0–0.2)

## 2019-05-24 LAB — APTT: aPTT: 29 seconds (ref 24–36)

## 2019-05-24 LAB — COMPREHENSIVE METABOLIC PANEL
ALT: 30 U/L (ref 0–44)
AST: 30 U/L (ref 15–41)
Albumin: 3.9 g/dL (ref 3.5–5.0)
Alkaline Phosphatase: 69 U/L (ref 38–126)
Anion gap: 15 (ref 5–15)
BUN: 7 mg/dL — ABNORMAL LOW (ref 8–23)
CO2: 22 mmol/L (ref 22–32)
Calcium: 9 mg/dL (ref 8.9–10.3)
Chloride: 92 mmol/L — ABNORMAL LOW (ref 98–111)
Creatinine, Ser: 0.51 mg/dL (ref 0.44–1.00)
GFR calc Af Amer: >60 mL/min (ref >60)
GFR calc non Af Amer: >60 mL/min (ref >60)
Glucose, Bld: 110 mg/dL — ABNORMAL HIGH (ref 70–99)
Potassium: 3.2 mmol/L — ABNORMAL LOW (ref 3.5–5.1)
Sodium: 129 mmol/L — ABNORMAL LOW (ref 135–145)
Total Bilirubin: 1 mg/dL (ref 0.3–1.2)
Total Protein: 6.6 g/dL (ref 6.5–8.1)

## 2019-05-24 LAB — BLOOD GAS, ARTERIAL
Acid-Base Excess: 0.9 mmol/L (ref 0.0–2.0)
Bicarbonate: 24.3 mmol/L (ref 20.0–28.0)
Drawn by: 421801
FIO2: 21
O2 Saturation: 97.6 %
Patient temperature: 37
pCO2 arterial: 34.7 mmHg (ref 32.0–48.0)
pH, Arterial: 7.459 — ABNORMAL HIGH (ref 7.350–7.450)
pO2, Arterial: 93.3 mmHg (ref 83.0–108.0)

## 2019-05-24 LAB — SURGICAL PCR SCREEN
MRSA, PCR: NEGATIVE
Staphylococcus aureus: NEGATIVE

## 2019-05-24 LAB — ABO/RH: ABO/RH(D): O POS

## 2019-05-24 LAB — SARS CORONAVIRUS 2 (TAT 6-24 HRS): SARS Coronavirus 2: NEGATIVE

## 2019-05-24 NOTE — Progress Notes (Signed)
PCP - Jani Gravel MD Cardiologist - Lyman Bishop MD   Chest x-ray - 05/24/19 EKG - 05/24/19 Stress Test - 07/07/2010 ECHO - pt denies Cardiac Cath - 11/20/2002   CPAP - yes- "I got a CPAP but I don't wear it because it's given me so many issues. I've had bronchitis and pneumonia with it so I don't wear it."   Aspirin Instructions: Follow your surgeon's instructions on when to stop Aspirin.  If no instructions were given by your surgeon then you will need to call the office to get those instructions.   Per pt she contacted office and they told her do not take DOS.   COVID TEST- 05/24/19   Anesthesia review: no  Patient denies shortness of breath, fever, cough and chest pain at PAT appointment   All instructions explained to the patient, with a verbal understanding of the material. Patient agrees to go over the instructions while at home for a better understanding. Patient also instructed to self quarantine after being tested for COVID-19. The opportunity to ask questions was provided.

## 2019-05-25 NOTE — Anesthesia Preprocedure Evaluation (Addendum)
Anesthesia Evaluation  Patient identified by MRN, date of birth, ID band Patient awake    Reviewed: Allergy & Precautions, NPO status , Patient's Chart, lab work & pertinent test results  Airway Mallampati: II  TM Distance: >3 FB  Positive for:  Tracheal deviation   Dental  (+) Teeth Intact, Chipped, Dental Advisory Given,    Pulmonary Current Smoker and Patient abstained from smoking.,    breath sounds clear to auscultation       Cardiovascular hypertension,  Rhythm:Regular Rate:Normal     Neuro/Psych    GI/Hepatic   Endo/Other    Renal/GU      Musculoskeletal   Abdominal   Peds  Hematology   Anesthesia Other Findings   Reproductive/Obstetrics                           Anesthesia Physical Anesthesia Plan  ASA: III  Anesthesia Plan: General   Post-op Pain Management:    Induction: Intravenous  PONV Risk Score and Plan: Ondansetron and Dexamethasone  Airway Management Planned: Double Lumen EBT  Additional Equipment: Arterial line, CVP and Ultrasound Guidance Line Placement  Intra-op Plan:   Post-operative Plan: Extubation in OR  Informed Consent: I have reviewed the patients History and Physical, chart, labs and discussed the procedure including the risks, benefits and alternatives for the proposed anesthesia with the patient or authorized representative who has indicated his/her understanding and acceptance.     Dental advisory given  Plan Discussed with: CRNA and Anesthesiologist  Anesthesia Plan Comments: (PAT note written 05/25/2019 by Myra Gianotti, PA-C. )       Anesthesia Quick Evaluation

## 2019-05-25 NOTE — Progress Notes (Signed)
Anesthesia Chart Review:  Case: [id number] Date/Time: 05/28/19 0715   Procedure: XI ROBOTIC ASSISTED THORASCOPY-WEDGE RESECTION, POSSIBLE UPPER LOBECTOMY (Right Chest)   Anesthesia type: General   Pre-op diagnosis: RUL NODULE   Location: [facility] OR ROOM 10 / [place] [facility]   Surgeons: Melrose Nakayama, MD      DISCUSSION: Patient is a 69 year old female scheduled for the above procedure. She recently had a lung cancer screening CT that showed a RUL nodule. She was referred to CT surgery by cardiologist Dr. Debara Pickett.  History includes smoking, post-operative N/V, HTN, endometriosis, dyslipidemia, OSA (intolerant to CPAP), anxiety, back surgery (left L3-4 microdiskectomy 08/15/00). Reported, "I'm usually hard to wake up" after anesthesia. Remote history of normal coronaries by 2004 [facility]. Known inferolateral T wave abnormality with non-ischemic stress test in 2017. Stable 4.2 cm ascending TAA by 03/2019 chest CT.  Preoperative labs showed mild hyponatremia with Na 129 and mild hypokalemia with K 3.2. No recent comparison labs currently available. She is on an HCTZ combo medication (Micardis HCT). Will order ISTAT8 for the morning of surgery to check for stability, otherwise anticipate that surgeon can follow lab trends post-operatively. 05/24/19 presurgical COVID-19 test was negative. Anesthesia team to evaluate on the day of surgery.    VS: BP 112/80   Pulse 85   Temp 36.9 C (Oral)   Resp 17   Ht 5\' 4"  (1.626 m)   Wt 49 kg   SpO2 96%   BMI 18.54 kg/m   PROVIDERS: Jani Gravel, MD is PCP Pixie Casino, MD is cardiologist. Last visit 04/09/19 for difficult to control HTN.    LABS: Preoperative labs noted. See DISCUSSION. (all labs ordered are listed, but only abnormal results are displayed)  Labs Reviewed  BLOOD GAS, ARTERIAL - Abnormal; Notable for the following components:      Result Value   pH, Arterial 7.459 (*)    Allens test (pass/fail) BRACHIAL ARTERY (*)    All other components within  normal limits  CBC - Abnormal; Notable for the following components:   Hemoglobin 15.7 (*)    MCH 34.1 (*)    All other components within normal limits  COMPREHENSIVE METABOLIC PANEL - Abnormal; Notable for the following components:   Sodium 129 (*)    Potassium 3.2 (*)    Chloride 92 (*)    Glucose, Bld 110 (*)    BUN 7 (*)    All other components within normal limits  URINALYSIS, ROUTINE W REFLEX MICROSCOPIC - Abnormal; Notable for the following components:   APPearance HAZY (*)    Ketones, ur 20 (*)    Leukocytes,Ua TRACE (*)    Bacteria, UA RARE (*)    All other components within normal limits  SURGICAL PCR SCREEN  APTT  PROTIME-INR  TYPE AND SCREEN  ABO/RH    OTHER: 6 minute walk test 05/09/19: Beginning VS @ 1610: Resp 20, HR 85, sp02 94% on RA. Pt denies dyspnea or fatigue prior to beginning test. Pt walks 9.5 laps=931 feet in 6 minutes without difficulty. She denies dyspnea or fatigue during and after walking. VS @ 1616: Resp 24, HR 89, spO2 94% on RA.    IMAGES: CXR 05/24/19: IMPRESSION: 1. No acute cardiopulmonary disease. 2. COPD. Pleural based right upper lobe nodule noted on the prior CT is vaguely seen radiographically.  PET scan 05/08/19: IMPRESSION: 1. Mild hypermetabolism corresponding to the posterior right upper lobe nodular opacity. Although this could represent an area of post infectious/inflammatory scarring, indolent  neoplasm cannot be excluded. 2. No hypermetabolic thoracic nodes. 3. Right tenth rib likely subacute non healed fracture with hypermetabolism. Hypermetabolism within either the anterior first right rib or adjacent medial right clavicle is without CT correlate, but also favored to be posttraumatic. 4. Incidental findings, including: Aortic atherosclerosis (ICD10-I70.0), coronary artery atherosclerosis and emphysema (ICD10-J43.9). Ascending aortic aneurysm of 4.1 cm.  CT Chest lung cancer screening 04/17/19: IMPRESSION: -  Lung-RADS 4A, suspicious. Follow up low-dose chest CT without contrast in 3 months (please use the following order, "CT CHEST LCS NODULE FOLLOW-UP W/O CM") is recommended. Alternatively, PET could be considered. - 13.1 mm solid/subsolid subpleural nodule in the posterior left upper lobe. - Stable 4.2 cm ascending thoracic aortic aneurysm. In this patient, attention on follow-up is satisfactory. This recommendation follows 2010 ACCF/AHA/AATS/ACR/ASA/SCA/SCAI/SIR/STS/SVM Guidelines for the Diagnosis and Management of Patients with Thoracic Aortic Disease. Circulation. 2010; 121: K160-F093. Aortic aneurysm NOS (ICD10-I71.9) - Aortic Atherosclerosis (ICD10-I70.0) and Emphysema (ICD10-J43.9).   EKG: 05/24/19:  Normal sinus rhythm Septal infarct , age undetermined new since 06/29/2010. T wave inversion now absent. Abnormal ECG Confirmed by Virgina Jock, Manish ([id number]) on 05/24/2019 7:24:18 PM - Most recent comparison EKG is from 04/09/19 (from [facility]) and also shows septal infarct with inferolateral T wave abnormality.   CV: Nuclear stress test 04/07/16:  The left ventricular ejection fraction is hyperdynamic (>65%).  Nuclear stress EF: 71%.  The study is normal.  This is a low risk study.  There was no ST segment deviation noted during stress. Normal resting and stress perfusion. No ischemia or infarction EF 71% Baseline ECG with inferior lateral T wave inversions   Cardiac cath 11/20/02: FINAL DIAGNOSES:  1. Angiographically patent coronary arteries with a balanced right and left     coronary system in terms of dominance.  2. Normal left ventricular systolic function.  3. Normal renal arteries and abdominal aorta.  Carotid US 10/24/01: IMPRESSION NO SIGNIFICANT CAROTID STENOSES DETECTED BY ULTRASOUND.  ESTIMATED 0 TO 40% BILATERAL ICA STENOSES BASED ON VELOCITY CRITERIA.  Past Medical History:  Diagnosis Date  . Active smoker   . Anxiety   . Cervical dysplasia   .  Complication of anesthesia    "I'm usually hard to wake up"  . Dyslipidemia   . Endometriosis   . History of nuclear stress test 07/07/2010   dipyridamole; low risk, post-stress EF 65%  . Hypertension   . Migraine   . Osteopenia    08/2006 and 08/2008 Dexa Scans w [facility] Blakes   . Pneumonia    hx pneunomia ~2016  . PONV (postoperative nausea and vomiting)   . Sleep apnea    does not wear her CPAP because "it gave me respiratory issues (bronchities and pneumonia)"    Past Surgical History:  Procedure Laterality Date  . BACK SURGERY  2002   RUPTURED DISC   . CARDIAC CATHETERIZATION  11/20/2002   patent coronary arteries   . CHOLECYSTECTOMY  2006  . COLPOSCOPY    . GYNECOLOGIC CRYOSURGERY    . NOSE SURGERY     X2  . OOPHORECTOMY     BSO  . SHOULDER SURGERY     ROTATOR CUFF X 2  . THROAT SURGERY     REMOVAL OF TUMOR  . TONSILLECTOMY    . VAGINAL HYSTERECTOMY  2000   WITH BSO    MEDICATIONS: . amLODipine (NORVASC) 5 MG tablet  . aspirin EC 81 MG tablet  . cholestyramine (QUESTRAN) 4 GM/DOSE powder  . denosumab (PROLIA) 60 MG/ML  SOLN injection  . metoprolol succinate (TOPROL-XL) 25 MG 24 hr tablet  . montelukast (SINGULAIR) 10 MG tablet  . rosuvastatin (CRESTOR) 10 MG tablet  . telmisartan-hydrochlorothiazide (MICARDIS HCT) 80-12.5 MG tablet  . temazepam (RESTORIL) 15 MG capsule  . VENTOLIN HFA 108 (90 BASE) MCG/ACT inhaler  . Vitamin D, Ergocalciferol, (DRISDOL) 1.25 MG (50000 UT) CAPS capsule   No current facility-administered medications for this encounter.    Myra Gianotti, PA-C Surgical Short Stay/Anesthesiology [facility] Phone [phone] [facility] Phone [phone] 05/25/2019 11:06 AM

## 2019-05-28 ENCOUNTER — Other Ambulatory Visit: Payer: Self-pay

## 2019-05-28 ENCOUNTER — Inpatient Hospital Stay (HOSPITAL_COMMUNITY): Payer: Medicare PPO | Admitting: Physician Assistant

## 2019-05-28 ENCOUNTER — Inpatient Hospital Stay (HOSPITAL_COMMUNITY)
Admission: RE | Admit: 2019-05-28 | Discharge: 2019-06-13 | DRG: 164 | Disposition: A | Discharge status: Home Health | Payer: Medicare PPO | Attending: Thoracic Surgery (Cardiothoracic Vascular Surgery) | Admitting: Thoracic Surgery (Cardiothoracic Vascular Surgery)

## 2019-05-28 ENCOUNTER — Inpatient Hospital Stay (HOSPITAL_COMMUNITY): Payer: Medicare PPO

## 2019-05-28 ENCOUNTER — Encounter (HOSPITAL_COMMUNITY)
Admission: RE | Disposition: A | Discharge status: Home Health | Payer: Self-pay | Source: Home / Self Care | Attending: Thoracic Surgery (Cardiothoracic Vascular Surgery)

## 2019-05-28 ENCOUNTER — Inpatient Hospital Stay (HOSPITAL_COMMUNITY): Payer: Medicare PPO | Admitting: Vascular Surgery

## 2019-05-28 ENCOUNTER — Encounter (HOSPITAL_COMMUNITY): Payer: Self-pay | Admitting: Thoracic Surgery (Cardiothoracic Vascular Surgery)

## 2019-05-28 DIAGNOSIS — Z801 Family history of malignant neoplasm of trachea, bronchus and lung: Secondary | ICD-10-CM

## 2019-05-28 DIAGNOSIS — I471 Supraventricular tachycardia: Secondary | ICD-10-CM | POA: Diagnosis not present

## 2019-05-28 DIAGNOSIS — Z4682 Encounter for fitting and adjustment of non-vascular catheter: Secondary | ICD-10-CM

## 2019-05-28 DIAGNOSIS — K2100 Gastro-esophageal reflux disease with esophagitis, without bleeding: Secondary | ICD-10-CM | POA: Diagnosis not present

## 2019-05-28 DIAGNOSIS — D62 Acute posthemorrhagic anemia: Secondary | ICD-10-CM | POA: Diagnosis not present

## 2019-05-28 DIAGNOSIS — I442 Atrioventricular block, complete: Secondary | ICD-10-CM | POA: Diagnosis not present

## 2019-05-28 DIAGNOSIS — E876 Hypokalemia: Secondary | ICD-10-CM | POA: Diagnosis not present

## 2019-05-28 DIAGNOSIS — Z7982 Long term (current) use of aspirin: Secondary | ICD-10-CM

## 2019-05-28 DIAGNOSIS — K259 Gastric ulcer, unspecified as acute or chronic, without hemorrhage or perforation: Secondary | ICD-10-CM | POA: Diagnosis present

## 2019-05-28 DIAGNOSIS — Z902 Acquired absence of lung [part of]: Secondary | ICD-10-CM

## 2019-05-28 DIAGNOSIS — Z79899 Other long term (current) drug therapy: Secondary | ICD-10-CM

## 2019-05-28 DIAGNOSIS — Z9049 Acquired absence of other specified parts of digestive tract: Secondary | ICD-10-CM

## 2019-05-28 DIAGNOSIS — I10 Essential (primary) hypertension: Secondary | ICD-10-CM | POA: Diagnosis present

## 2019-05-28 DIAGNOSIS — K567 Ileus, unspecified: Secondary | ICD-10-CM

## 2019-05-28 DIAGNOSIS — I472 Ventricular tachycardia: Secondary | ICD-10-CM | POA: Diagnosis not present

## 2019-05-28 DIAGNOSIS — K449 Diaphragmatic hernia without obstruction or gangrene: Secondary | ICD-10-CM | POA: Diagnosis present

## 2019-05-28 DIAGNOSIS — J398 Other specified diseases of upper respiratory tract: Secondary | ICD-10-CM | POA: Diagnosis present

## 2019-05-28 DIAGNOSIS — F419 Anxiety disorder, unspecified: Secondary | ICD-10-CM | POA: Diagnosis present

## 2019-05-28 DIAGNOSIS — F17210 Nicotine dependence, cigarettes, uncomplicated: Secondary | ICD-10-CM | POA: Diagnosis present

## 2019-05-28 DIAGNOSIS — C3491 Malignant neoplasm of unspecified part of right bronchus or lung: Secondary | ICD-10-CM

## 2019-05-28 DIAGNOSIS — K9189 Other postprocedural complications and disorders of digestive system: Secondary | ICD-10-CM | POA: Diagnosis not present

## 2019-05-28 DIAGNOSIS — R001 Bradycardia, unspecified: Secondary | ICD-10-CM | POA: Diagnosis not present

## 2019-05-28 DIAGNOSIS — M8580 Other specified disorders of bone density and structure, unspecified site: Secondary | ICD-10-CM | POA: Diagnosis present

## 2019-05-28 DIAGNOSIS — I6523 Occlusion and stenosis of bilateral carotid arteries: Secondary | ICD-10-CM | POA: Diagnosis present

## 2019-05-28 DIAGNOSIS — G4730 Sleep apnea, unspecified: Secondary | ICD-10-CM | POA: Diagnosis present

## 2019-05-28 DIAGNOSIS — I2510 Atherosclerotic heart disease of native coronary artery without angina pectoris: Secondary | ICD-10-CM | POA: Diagnosis present

## 2019-05-28 DIAGNOSIS — J439 Emphysema, unspecified: Secondary | ICD-10-CM | POA: Diagnosis present

## 2019-05-28 DIAGNOSIS — X58XXXA Exposure to other specified factors, initial encounter: Secondary | ICD-10-CM | POA: Diagnosis present

## 2019-05-28 DIAGNOSIS — Z888 Allergy status to other drugs, medicaments and biological substances status: Secondary | ICD-10-CM

## 2019-05-28 DIAGNOSIS — Z885 Allergy status to narcotic agent status: Secondary | ICD-10-CM

## 2019-05-28 DIAGNOSIS — Z90710 Acquired absence of both cervix and uterus: Secondary | ICD-10-CM

## 2019-05-28 DIAGNOSIS — Z8701 Personal history of pneumonia (recurrent): Secondary | ICD-10-CM

## 2019-05-28 DIAGNOSIS — R911 Solitary pulmonary nodule: Secondary | ICD-10-CM

## 2019-05-28 DIAGNOSIS — E785 Hyperlipidemia, unspecified: Secondary | ICD-10-CM | POA: Diagnosis present

## 2019-05-28 DIAGNOSIS — J9382 Other air leak: Secondary | ICD-10-CM | POA: Diagnosis not present

## 2019-05-28 DIAGNOSIS — Z87410 Personal history of cervical dysplasia: Secondary | ICD-10-CM

## 2019-05-28 DIAGNOSIS — S2231XA Fracture of one rib, right side, initial encounter for closed fracture: Secondary | ICD-10-CM | POA: Diagnosis present

## 2019-05-28 DIAGNOSIS — C3411 Malignant neoplasm of upper lobe, right bronchus or lung: Principal | ICD-10-CM | POA: Diagnosis present

## 2019-05-28 DIAGNOSIS — R140 Abdominal distension (gaseous): Secondary | ICD-10-CM

## 2019-05-28 DIAGNOSIS — Z8249 Family history of ischemic heart disease and other diseases of the circulatory system: Secondary | ICD-10-CM

## 2019-05-28 DIAGNOSIS — Z09 Encounter for follow-up examination after completed treatment for conditions other than malignant neoplasm: Secondary | ICD-10-CM

## 2019-05-28 DIAGNOSIS — Z881 Allergy status to other antibiotic agents status: Secondary | ICD-10-CM

## 2019-05-28 DIAGNOSIS — I712 Thoracic aortic aneurysm, without rupture: Secondary | ICD-10-CM | POA: Diagnosis present

## 2019-05-28 DIAGNOSIS — J939 Pneumothorax, unspecified: Secondary | ICD-10-CM

## 2019-05-28 DIAGNOSIS — K120 Recurrent oral aphthae: Secondary | ICD-10-CM | POA: Diagnosis not present

## 2019-05-28 DIAGNOSIS — R112 Nausea with vomiting, unspecified: Secondary | ICD-10-CM

## 2019-05-28 DIAGNOSIS — E871 Hypo-osmolality and hyponatremia: Secondary | ICD-10-CM | POA: Diagnosis not present

## 2019-05-28 DIAGNOSIS — R9431 Abnormal electrocardiogram [ECG] [EKG]: Secondary | ICD-10-CM | POA: Diagnosis not present

## 2019-05-28 DIAGNOSIS — Z8711 Personal history of peptic ulcer disease: Secondary | ICD-10-CM

## 2019-05-28 HISTORY — PX: THORACOSCOPY: SUR1347

## 2019-05-28 HISTORY — DX: Personal history of peptic ulcer disease: Z87.11

## 2019-05-28 HISTORY — PX: INTERCOSTAL NERVE BLOCK: SHX5021

## 2019-05-28 HISTORY — PX: NODE DISSECTION: SHX5269

## 2019-05-28 HISTORY — DX: Malignant neoplasm of unspecified part of right bronchus or lung: C34.91

## 2019-05-28 LAB — POCT I-STAT 7, (LYTES, BLD GAS, ICA,H+H)
Acid-base deficit: 1 mmol/L (ref 0.0–2.0)
Bicarbonate: 28.2 mmol/L — ABNORMAL HIGH (ref 20.0–28.0)
Calcium, Ion: 1.16 mmol/L (ref 1.15–1.40)
HCT: 39 % (ref 36.0–46.0)
Hemoglobin: 13.3 g/dL (ref 12.0–15.0)
O2 Saturation: 96 %
Patient temperature: 34.3
Potassium: 3.3 mmol/L — ABNORMAL LOW (ref 3.5–5.1)
Sodium: 132 mmol/L — ABNORMAL LOW (ref 135–145)
TCO2: 30 mmol/L (ref 22–32)
pCO2 arterial: 61.5 mmHg — ABNORMAL HIGH (ref 32.0–48.0)
pH, Arterial: 7.255 — ABNORMAL LOW (ref 7.350–7.450)
pO2, Arterial: 89 mmHg (ref 83.0–108.0)

## 2019-05-28 LAB — POCT I-STAT, CHEM 8
BUN: 12 mg/dL (ref 8–23)
Calcium, Ion: 1.13 mmol/L — ABNORMAL LOW (ref 1.15–1.40)
Chloride: 92 mmol/L — ABNORMAL LOW (ref 98–111)
Creatinine, Ser: 0.5 mg/dL (ref 0.44–1.00)
Glucose, Bld: 96 mg/dL (ref 70–99)
HCT: 45 % (ref 36.0–46.0)
Hemoglobin: 15.3 g/dL — ABNORMAL HIGH (ref 12.0–15.0)
Potassium: 3.2 mmol/L — ABNORMAL LOW (ref 3.5–5.1)
Sodium: 131 mmol/L — ABNORMAL LOW (ref 135–145)
TCO2: 31 mmol/L (ref 22–32)

## 2019-05-28 LAB — PREPARE RBC (CROSSMATCH)

## 2019-05-28 SURGERY — LOBECTOMY, LUNG, ROBOT-ASSISTED, USING VATS
Anesthesia: General | Site: Chest | Laterality: Right

## 2019-05-28 MED ORDER — MIDAZOLAM HCL 2 MG/2ML IJ SOLN
INTRAMUSCULAR | Status: AC
  Filled 2019-05-28: qty 2

## 2019-05-28 MED ORDER — MIDAZOLAM HCL 5 MG/5ML IJ SOLN
INTRAMUSCULAR | Status: DC | PRN
  Administered 2019-05-28: 1 mg via INTRAVENOUS
  Administered 2019-05-28: 2 mg via INTRAVENOUS

## 2019-05-28 MED ORDER — PROPOFOL 10 MG/ML IV BOLUS
INTRAVENOUS | Status: DC | PRN
  Administered 2019-05-28: 30 mg via INTRAVENOUS
  Administered 2019-05-28: 100 mg via INTRAVENOUS
  Administered 2019-05-28: 10 mg via INTRAVENOUS

## 2019-05-28 MED ORDER — ONDANSETRON HCL 4 MG/2ML IJ SOLN
4.0000 mg | Freq: Four times a day (QID) | INTRAMUSCULAR | Status: DC | PRN
  Administered 2019-05-29 – 2019-06-07 (×8): 4 mg via INTRAVENOUS
  Filled 2019-05-28 (×8): qty 2

## 2019-05-28 MED ORDER — SODIUM CHLORIDE 0.9 % IR SOLN
Status: DC | PRN
  Administered 2019-05-28: 1000 mL

## 2019-05-28 MED ORDER — SUGAMMADEX SODIUM 200 MG/2ML IV SOLN
INTRAVENOUS | Status: DC | PRN
  Administered 2019-05-28: 200 mg via INTRAVENOUS

## 2019-05-28 MED ORDER — EPHEDRINE SULFATE 50 MG/ML IJ SOLN
INTRAMUSCULAR | Status: DC | PRN
  Administered 2019-05-28 (×7): 5 mg via INTRAVENOUS
  Administered 2019-05-28: 10 mg via INTRAVENOUS
  Administered 2019-05-28: 5 mg via INTRAVENOUS

## 2019-05-28 MED ORDER — CHOLESTYRAMINE 4 G PO PACK
4.0000 g | PACK | Freq: Every day | ORAL | Status: DC
  Administered 2019-05-29 – 2019-06-04 (×5): 4 g via ORAL
  Filled 2019-05-28 (×9): qty 1

## 2019-05-28 MED ORDER — FENTANYL CITRATE (PF) 100 MCG/2ML IJ SOLN: 25.0000 ug | INTRAMUSCULAR | Status: DC | PRN

## 2019-05-28 MED ORDER — NICOTINE 21 MG/24HR TD PT24
21.0000 mg | MEDICATED_PATCH | Freq: Every day | TRANSDERMAL | Status: DC
  Administered 2019-05-28 – 2019-06-13 (×17): 21 mg via TRANSDERMAL
  Filled 2019-05-28 (×17): qty 1

## 2019-05-28 MED ORDER — SCOPOLAMINE 1 MG/3DAYS TD PT72
MEDICATED_PATCH | TRANSDERMAL | Status: DC | PRN
  Administered 2019-05-28: 1 via TRANSDERMAL

## 2019-05-28 MED ORDER — FENTANYL 40 MCG/ML IV SOLN
INTRAVENOUS | Status: DC
  Administered 2019-05-28 – 2019-05-29 (×2): 10 ug via INTRAVENOUS
  Administered 2019-05-29: 20 ug via INTRAVENOUS
  Filled 2019-05-28: qty 25

## 2019-05-28 MED ORDER — SODIUM CHLORIDE 0.9 % IV SOLN: INTRAVENOUS | Status: DC | PRN

## 2019-05-28 MED ORDER — ALBUMIN HUMAN 5 % IV SOLN
INTRAVENOUS | Status: AC
  Filled 2019-05-28: qty 250

## 2019-05-28 MED ORDER — 0.9 % SODIUM CHLORIDE (POUR BTL) OPTIME
TOPICAL | Status: DC | PRN
  Administered 2019-05-28 (×2): 1000 mL

## 2019-05-28 MED ORDER — ASPIRIN EC 81 MG PO TBEC
81.0000 mg | DELAYED_RELEASE_TABLET | Freq: Every day | ORAL | Status: DC
  Administered 2019-05-28 – 2019-06-13 (×17): 81 mg via ORAL
  Filled 2019-05-28 (×17): qty 1

## 2019-05-28 MED ORDER — FENTANYL CITRATE (PF) 250 MCG/5ML IJ SOLN
INTRAMUSCULAR | Status: AC
  Filled 2019-05-28: qty 5

## 2019-05-28 MED ORDER — ONDANSETRON HCL 4 MG/2ML IJ SOLN
INTRAMUSCULAR | Status: DC | PRN
  Administered 2019-05-28: 4 mg via INTRAVENOUS

## 2019-05-28 MED ORDER — ONDANSETRON HCL 4 MG/2ML IJ SOLN
INTRAMUSCULAR | Status: AC
  Filled 2019-05-28: qty 2

## 2019-05-28 MED ORDER — BISACODYL 5 MG PO TBEC
10.0000 mg | DELAYED_RELEASE_TABLET | Freq: Every day | ORAL | Status: DC
  Administered 2019-05-28: 17:00:00 10 mg via ORAL
  Filled 2019-05-28 (×2): qty 2

## 2019-05-28 MED ORDER — CEFAZOLIN SODIUM-DEXTROSE 2-4 GM/100ML-% IV SOLN
2.0000 g | INTRAVENOUS | Status: AC
  Administered 2019-05-28: 2 g via INTRAVENOUS
  Filled 2019-05-28: qty 100

## 2019-05-28 MED ORDER — NALOXONE HCL 0.4 MG/ML IJ SOLN: 0.4000 mg | INTRAMUSCULAR | Status: DC | PRN

## 2019-05-28 MED ORDER — PHENYLEPHRINE HCL (PRESSORS) 10 MG/ML IV SOLN
INTRAVENOUS | Status: DC | PRN
  Administered 2019-05-28 (×3): 80 ug via INTRAVENOUS
  Administered 2019-05-28 (×3): 40 ug via INTRAVENOUS

## 2019-05-28 MED ORDER — AMLODIPINE BESYLATE 5 MG PO TABS
5.0000 mg | ORAL_TABLET | Freq: Every day | ORAL | Status: DC
  Administered 2019-05-29 – 2019-06-13 (×16): 5 mg via ORAL
  Filled 2019-05-28 (×16): qty 1

## 2019-05-28 MED ORDER — HEMOSTATIC AGENTS (NO CHARGE) OPTIME
TOPICAL | Status: DC | PRN
  Administered 2019-05-28: 2 via TOPICAL
  Administered 2019-05-28: 3 via TOPICAL

## 2019-05-28 MED ORDER — EPHEDRINE 5 MG/ML INJ
INTRAVENOUS | Status: AC
  Filled 2019-05-28: qty 10

## 2019-05-28 MED ORDER — ONDANSETRON HCL 4 MG/2ML IJ SOLN: 4.0000 mg | Freq: Four times a day (QID) | INTRAMUSCULAR | Status: DC | PRN

## 2019-05-28 MED ORDER — LIDOCAINE 2% (20 MG/ML) 5 ML SYRINGE
INTRAMUSCULAR | Status: AC
  Filled 2019-05-28: qty 5

## 2019-05-28 MED ORDER — KETOROLAC TROMETHAMINE 15 MG/ML IJ SOLN
15.0000 mg | Freq: Three times a day (TID) | INTRAMUSCULAR | Status: AC | PRN
  Administered 2019-06-02: 15 mg via INTRAVENOUS
  Filled 2019-05-28: qty 1

## 2019-05-28 MED ORDER — LACTATED RINGERS IV SOLN: INTRAVENOUS | Status: DC | PRN

## 2019-05-28 MED ORDER — ROCURONIUM BROMIDE 10 MG/ML (PF) SYRINGE
PREFILLED_SYRINGE | INTRAVENOUS | Status: AC
  Filled 2019-05-28: qty 10

## 2019-05-28 MED ORDER — SODIUM CHLORIDE 0.9% FLUSH
9.0000 mL | INTRAVENOUS | Status: DC | PRN
  Administered 2019-06-04: 9 mL via INTRAVENOUS

## 2019-05-28 MED ORDER — ALBUMIN HUMAN 5 % IV SOLN: INTRAVENOUS | Status: DC | PRN

## 2019-05-28 MED ORDER — TELMISARTAN-HCTZ 80-12.5 MG PO TABS: 1.0000 | ORAL_TABLET | Freq: Every day | ORAL | Status: DC

## 2019-05-28 MED ORDER — PHENYLEPHRINE HCL-NACL 10-0.9 MG/250ML-% IV SOLN
INTRAVENOUS | Status: DC | PRN
  Administered 2019-05-28: 30 ug/min via INTRAVENOUS

## 2019-05-28 MED ORDER — IRBESARTAN 150 MG PO TABS
300.0000 mg | ORAL_TABLET | Freq: Every day | ORAL | Status: DC
  Administered 2019-05-29: 10:00:00 300 mg via ORAL
  Filled 2019-05-28: qty 2

## 2019-05-28 MED ORDER — ALBUMIN HUMAN 5 % IV SOLN
12.5000 g | Freq: Once | INTRAVENOUS | Status: AC
  Administered 2019-05-28: 12.5 g via INTRAVENOUS

## 2019-05-28 MED ORDER — ONDANSETRON HCL 4 MG/2ML IJ SOLN: 4.0000 mg | Freq: Once | INTRAMUSCULAR | Status: DC | PRN

## 2019-05-28 MED ORDER — DIPHENHYDRAMINE HCL 12.5 MG/5ML PO ELIX: 12.5000 mg | ORAL_SOLUTION | Freq: Four times a day (QID) | ORAL | Status: DC | PRN

## 2019-05-28 MED ORDER — PHENYLEPHRINE 40 MCG/ML (10ML) SYRINGE FOR IV PUSH (FOR BLOOD PRESSURE SUPPORT)
PREFILLED_SYRINGE | INTRAVENOUS | Status: AC
  Filled 2019-05-28: qty 10

## 2019-05-28 MED ORDER — GLYCOPYRROLATE PF 0.2 MG/ML IJ SOSY
PREFILLED_SYRINGE | INTRAMUSCULAR | Status: DC | PRN
  Administered 2019-05-28: .1 mg via INTRAVENOUS

## 2019-05-28 MED ORDER — PROPOFOL 10 MG/ML IV BOLUS
INTRAVENOUS | Status: AC
  Filled 2019-05-28: qty 20

## 2019-05-28 MED ORDER — MONTELUKAST SODIUM 10 MG PO TABS
10.0000 mg | ORAL_TABLET | Freq: Every day | ORAL | Status: DC
  Administered 2019-05-29 – 2019-06-12 (×15): 10 mg via ORAL
  Filled 2019-05-28 (×15): qty 1

## 2019-05-28 MED ORDER — CEFAZOLIN SODIUM-DEXTROSE 2-4 GM/100ML-% IV SOLN
2.0000 g | Freq: Three times a day (TID) | INTRAVENOUS | Status: AC
  Administered 2019-05-28 – 2019-05-29 (×2): 2 g via INTRAVENOUS
  Filled 2019-05-28 (×2): qty 100

## 2019-05-28 MED ORDER — ENOXAPARIN SODIUM 40 MG/0.4ML ~~LOC~~ SOLN
40.0000 mg | Freq: Every day | SUBCUTANEOUS | Status: DC
  Administered 2019-05-30 – 2019-06-13 (×15): 40 mg via SUBCUTANEOUS
  Filled 2019-05-28 (×17): qty 0.4

## 2019-05-28 MED ORDER — CHLORHEXIDINE GLUCONATE CLOTH 2 % EX PADS
6.0000 | MEDICATED_PAD | Freq: Every day | CUTANEOUS | Status: DC
  Administered 2019-05-29 – 2019-06-13 (×12): 6 via TOPICAL

## 2019-05-28 MED ORDER — DEXAMETHASONE SODIUM PHOSPHATE 10 MG/ML IJ SOLN
INTRAMUSCULAR | Status: DC | PRN
  Administered 2019-05-28: 10 mg via INTRAVENOUS

## 2019-05-28 MED ORDER — ROSUVASTATIN CALCIUM 5 MG PO TABS
10.0000 mg | ORAL_TABLET | Freq: Every day | ORAL | Status: DC
  Administered 2019-05-29 – 2019-06-13 (×16): 10 mg via ORAL
  Filled 2019-05-28 (×17): qty 2

## 2019-05-28 MED ORDER — BUPIVACAINE LIPOSOME 1.3 % IJ SUSP
20.0000 mL | Freq: Once | INTRAMUSCULAR | Status: DC
  Filled 2019-05-28: qty 20

## 2019-05-28 MED ORDER — SODIUM CHLORIDE 0.9 % IV SOLN: INTRAVENOUS | Status: DC

## 2019-05-28 MED ORDER — ACETAMINOPHEN 160 MG/5ML PO SOLN
1000.0000 mg | Freq: Four times a day (QID) | ORAL | Status: AC
  Administered 2019-05-29 – 2019-06-01 (×7): 1000 mg via ORAL
  Filled 2019-05-28 (×7): qty 40.6

## 2019-05-28 MED ORDER — BUPIVACAINE HCL (PF) 0.5 % IJ SOLN
INTRAMUSCULAR | Status: AC
  Filled 2019-05-28: qty 30

## 2019-05-28 MED ORDER — DEXAMETHASONE SODIUM PHOSPHATE 10 MG/ML IJ SOLN
INTRAMUSCULAR | Status: AC
  Filled 2019-05-28: qty 1

## 2019-05-28 MED ORDER — DIPHENHYDRAMINE HCL 50 MG/ML IJ SOLN: 12.5000 mg | Freq: Four times a day (QID) | INTRAMUSCULAR | Status: DC | PRN

## 2019-05-28 MED ORDER — ALBUTEROL SULFATE HFA 108 (90 BASE) MCG/ACT IN AERS
INHALATION_SPRAY | RESPIRATORY_TRACT | Status: DC | PRN
  Administered 2019-05-28 (×2): 6 via RESPIRATORY_TRACT

## 2019-05-28 MED ORDER — ALBUTEROL SULFATE (2.5 MG/3ML) 0.083% IN NEBU
2.5000 mg | INHALATION_SOLUTION | RESPIRATORY_TRACT | Status: DC
  Administered 2019-05-28 – 2019-05-29 (×5): 2.5 mg via RESPIRATORY_TRACT
  Filled 2019-05-28 (×5): qty 3

## 2019-05-28 MED ORDER — FENTANYL 40 MCG/ML IV SOLN
INTRAVENOUS | Status: DC
  Administered 2019-05-28: 14:00:00 1000 ug via INTRAVENOUS
  Filled 2019-05-28: qty 25

## 2019-05-28 MED ORDER — PANTOPRAZOLE SODIUM 40 MG PO TBEC
40.0000 mg | DELAYED_RELEASE_TABLET | Freq: Every day | ORAL | Status: DC
  Administered 2019-05-28 – 2019-06-03 (×7): 40 mg via ORAL
  Filled 2019-05-28 (×7): qty 1

## 2019-05-28 MED ORDER — SODIUM CHLORIDE 0.9% FLUSH: 9.0000 mL | INTRAVENOUS | Status: DC | PRN

## 2019-05-28 MED ORDER — GLYCOPYRROLATE PF 0.2 MG/ML IJ SOSY
PREFILLED_SYRINGE | INTRAMUSCULAR | Status: AC
  Filled 2019-05-28: qty 1

## 2019-05-28 MED ORDER — SODIUM CHLORIDE FLUSH 0.9 % IV SOLN
INTRAVENOUS | Status: DC | PRN
  Administered 2019-05-28: 80 mL

## 2019-05-28 MED ORDER — ROCURONIUM BROMIDE 50 MG/5ML IV SOSY
PREFILLED_SYRINGE | INTRAVENOUS | Status: DC | PRN
  Administered 2019-05-28: 60 mg via INTRAVENOUS
  Administered 2019-05-28: 10 mg via INTRAVENOUS
  Administered 2019-05-28: 40 mg via INTRAVENOUS
  Administered 2019-05-28: 10 mg via INTRAVENOUS

## 2019-05-28 MED ORDER — HYDROCHLOROTHIAZIDE 12.5 MG PO CAPS
12.5000 mg | ORAL_CAPSULE | Freq: Every day | ORAL | Status: DC
  Administered 2019-05-29 – 2019-06-07 (×10): 12.5 mg via ORAL
  Filled 2019-05-28 (×10): qty 1

## 2019-05-28 MED ORDER — SODIUM CHLORIDE 0.9% IV SOLUTION: Freq: Once | INTRAVENOUS | Status: DC

## 2019-05-28 MED ORDER — SENNOSIDES-DOCUSATE SODIUM 8.6-50 MG PO TABS
1.0000 | ORAL_TABLET | Freq: Every day | ORAL | Status: DC
  Administered 2019-05-28 – 2019-06-12 (×7): 1 via ORAL
  Filled 2019-05-28 (×10): qty 1

## 2019-05-28 MED ORDER — ACETAMINOPHEN 500 MG PO TABS
1000.0000 mg | ORAL_TABLET | Freq: Four times a day (QID) | ORAL | Status: AC
  Administered 2019-05-28 – 2019-05-29 (×4): 1000 mg via ORAL
  Filled 2019-05-28 (×7): qty 2

## 2019-05-28 MED ORDER — FENTANYL CITRATE (PF) 100 MCG/2ML IJ SOLN
INTRAMUSCULAR | Status: DC | PRN
  Administered 2019-05-28 (×2): 50 ug via INTRAVENOUS
  Administered 2019-05-28: 100 ug via INTRAVENOUS
  Administered 2019-05-28 (×4): 50 ug via INTRAVENOUS

## 2019-05-28 SURGICAL SUPPLY — 146 items
ADH SKN CLS APL DERMABOND .7 (GAUZE/BANDAGES/DRESSINGS) ×2
APPLIER CLIP ROT 10 11.4 M/L (STAPLE)
APR CLP MED LRG 11.4X10 (STAPLE)
BAG SPEC RTRVL 10 TROC 200 (ENDOMECHANICALS) ×2
BAG SPEC RTRVL LRG 6X4 10 (ENDOMECHANICALS)
BLADE CLIPPER SURG (BLADE) ×2 IMPLANT
BLADE SURG 11 STRL SS (BLADE) ×2 IMPLANT
BNDG COHESIVE 4X5 TAN STRL (GAUZE/BANDAGES/DRESSINGS) ×2 IMPLANT
CANISTER SUCT 3000ML PPV (MISCELLANEOUS) ×4 IMPLANT
CANNULA REDUC XI 12-8 STAPL (CANNULA) ×2
CANNULA REDUC XI 12-8MM STAPL (CANNULA) ×2
CANNULA REDUCER 12-8 DVNC XI (CANNULA) ×4 IMPLANT
CATH THORACIC 28FR (CATHETERS) ×2 IMPLANT
CATH THORACIC 28FR RT ANG (CATHETERS) IMPLANT
CATH THORACIC 36FR (CATHETERS) IMPLANT
CATH THORACIC 36FR RT ANG (CATHETERS) IMPLANT
CLIP APPLIE ROT 10 11.4 M/L (STAPLE) IMPLANT
CLIP VESOCCLUDE MED 6/CT (CLIP) IMPLANT
CNTNR URN SCR LID CUP LEK RST (MISCELLANEOUS) IMPLANT
CONN ST 1/4X3/8  BEN (MISCELLANEOUS)
CONN ST 1/4X3/8 BEN (MISCELLANEOUS) IMPLANT
CONN Y 3/8X3/8X3/8  BEN (MISCELLANEOUS)
CONN Y 3/8X3/8X3/8 BEN (MISCELLANEOUS) IMPLANT
CONT SPEC 4OZ CLIKSEAL STRL BL (MISCELLANEOUS) ×8 IMPLANT
CONT SPEC 4OZ STRL OR WHT (MISCELLANEOUS) ×60
COVER SURGICAL LIGHT HANDLE (MISCELLANEOUS) IMPLANT
DEFOGGER SCOPE WARMER CLEARIFY (MISCELLANEOUS) ×4 IMPLANT
DERMABOND ADVANCED (GAUZE/BANDAGES/DRESSINGS) ×2
DERMABOND ADVANCED .7 DNX12 (GAUZE/BANDAGES/DRESSINGS) IMPLANT
DRAIN CHANNEL 28F RND 3/8 FF (WOUND CARE) IMPLANT
DRAIN CHANNEL 32F RND 10.7 FF (WOUND CARE) IMPLANT
DRAPE ARM DVNC X/XI (DISPOSABLE) ×8 IMPLANT
DRAPE COLUMN DVNC XI (DISPOSABLE) ×2 IMPLANT
DRAPE CV SPLIT W-CLR ANES SCRN (DRAPES) ×4 IMPLANT
DRAPE DA VINCI XI ARM (DISPOSABLE) ×8
DRAPE DA VINCI XI COLUMN (DISPOSABLE) ×2
DRAPE ORTHO SPLIT 77X108 STRL (DRAPES) ×4
DRAPE SURG ORHT 6 SPLT 77X108 (DRAPES) ×2 IMPLANT
DRAPE WARM FLUID 44X44 (DRAPES) IMPLANT
ELECT BLADE 6.5 EXT (BLADE) IMPLANT
ELECT REM PT RETURN 9FT ADLT (ELECTROSURGICAL) ×4
ELECTRODE REM PT RTRN 9FT ADLT (ELECTROSURGICAL) ×2 IMPLANT
GAUZE KITTNER 4X10 (MISCELLANEOUS) ×4 IMPLANT
GAUZE KITTNER 4X8 (MISCELLANEOUS) ×8 IMPLANT
GAUZE SPONGE 4X4 12PLY STRL (GAUZE/BANDAGES/DRESSINGS) ×4 IMPLANT
GLOVE BIO SURGEON STRL SZ 6.5 (GLOVE) ×4 IMPLANT
GLOVE BIO SURGEON STRL SZ7 (GLOVE) ×8 IMPLANT
GLOVE BIO SURGEONS STRL SZ 6.5 (GLOVE) ×4
GLOVE BIOGEL PI IND STRL 7.5 (GLOVE) IMPLANT
GLOVE BIOGEL PI IND STRL 8 (GLOVE) IMPLANT
GLOVE BIOGEL PI INDICATOR 7.5 (GLOVE) ×2
GLOVE BIOGEL PI INDICATOR 8 (GLOVE) ×4
GLOVE ECLIPSE 8.0 STRL XLNG CF (GLOVE) ×8 IMPLANT
GLOVE SURG SIGNA 7.5 PF LTX (GLOVE) ×8 IMPLANT
GOWN STRL REIN 2XL XLG LVL4 (GOWN DISPOSABLE) ×2 IMPLANT
GOWN STRL REUS W/ TWL LRG LVL3 (GOWN DISPOSABLE) ×4 IMPLANT
GOWN STRL REUS W/ TWL XL LVL3 (GOWN DISPOSABLE) ×8 IMPLANT
GOWN STRL REUS W/TWL LRG LVL3 (GOWN DISPOSABLE) ×8
GOWN STRL REUS W/TWL XL LVL3 (GOWN DISPOSABLE) ×16
HEMOSTAT SURGICEL 2X14 (HEMOSTASIS) ×20 IMPLANT
IRRIGATOR SUCT 8 DISP DVNC XI (IRRIGATION / IRRIGATOR) ×2 IMPLANT
IRRIGATOR SUCTION 8MM XI DISP (IRRIGATION / IRRIGATOR) ×2
KIT BASIN OR (CUSTOM PROCEDURE TRAY) ×4 IMPLANT
KIT SUCTION CATH 14FR (SUCTIONS) IMPLANT
KIT TURNOVER KIT B (KITS) ×4 IMPLANT
LOOP VESSEL SUPERMAXI WHITE (MISCELLANEOUS) IMPLANT
NDL HYPO 25GX1X1/2 BEV (NEEDLE) ×2 IMPLANT
NDL SPNL 18GX3.5 QUINCKE PK (NEEDLE) IMPLANT
NEEDLE HYPO 25GX1X1/2 BEV (NEEDLE) ×4 IMPLANT
NEEDLE SPNL 18GX3.5 QUINCKE PK (NEEDLE) IMPLANT
NS IRRIG 1000ML POUR BTL (IV SOLUTION) ×6 IMPLANT
PACK CHEST (CUSTOM PROCEDURE TRAY) ×4 IMPLANT
PAD ARMBOARD 7.5X6 YLW CONV (MISCELLANEOUS) ×8 IMPLANT
PATTIES SURGICAL 3 X3 (GAUZE/BANDAGES/DRESSINGS) ×4
PATTIES SURGICAL 3X3 (GAUZE/BANDAGES/DRESSINGS) ×4 IMPLANT
POUCH ENDO CATCH II 15MM (MISCELLANEOUS) IMPLANT
POUCH RETRIEVAL ECOSAC 10 (ENDOMECHANICALS) IMPLANT
POUCH RETRIEVAL ECOSAC 10MM (ENDOMECHANICALS) ×2
POUCH SPECIMEN RETRIEVAL 10MM (ENDOMECHANICALS) IMPLANT
RELOAD STAPLE 45 2.5 WHT DVNC (STAPLE) IMPLANT
RELOAD STAPLE 45 3.5 BLU DVNC (STAPLE) IMPLANT
RELOAD STAPLE 45 4.3 GRN DVNC (STAPLE) IMPLANT
RELOAD STAPLER 2.5X45 WHT DVNC (STAPLE) ×6 IMPLANT
RELOAD STAPLER 3.5X45 BLU DVNC (STAPLE) ×30 IMPLANT
RELOAD STAPLER 4.3X45 GRN DVNC (STAPLE) ×14 IMPLANT
SCISSORS LAP 5X35 DISP (ENDOMECHANICALS) IMPLANT
SEAL CANN UNIV 5-8 DVNC XI (MISCELLANEOUS) ×4 IMPLANT
SEAL XI 5MM-8MM UNIVERSAL (MISCELLANEOUS) ×4
SEALANT PROGEL (MISCELLANEOUS) IMPLANT
SEALANT SURG COSEAL 4ML (VASCULAR PRODUCTS) IMPLANT
SEALANT SURG COSEAL 8ML (VASCULAR PRODUCTS) IMPLANT
SEALER VESSEL DA VINCI XI (MISCELLANEOUS) ×2
SEALER VESSEL EXT DVNC XI (MISCELLANEOUS) IMPLANT
SET IRRIG TUBING LAPAROSCOPIC (IRRIGATION / IRRIGATOR) ×2 IMPLANT
SET TUBE SMOKE EVAC HIGH FLOW (TUBING) IMPLANT
SHEARS HARMONIC HDI 20CM (ELECTROSURGICAL) IMPLANT
SHEET MEDIUM DRAPE 40X70 STRL (DRAPES) ×4 IMPLANT
SOL ANTI FOG 6CC (MISCELLANEOUS) ×2 IMPLANT
SOLUTION ANTI FOG 6CC (MISCELLANEOUS) ×2
SPECIMEN JAR MEDIUM (MISCELLANEOUS) IMPLANT
SPONGE INTESTINAL PEANUT (DISPOSABLE) IMPLANT
SPONGE TONSIL TAPE 1 RFD (DISPOSABLE) ×4 IMPLANT
STAPLER 45 SUREFORM CVD (STAPLE) ×4
STAPLER 45 SUREFORM CVD DVNC (STAPLE) IMPLANT
STAPLER CANNULA SEAL DVNC XI (STAPLE) ×4 IMPLANT
STAPLER CANNULA SEAL XI (STAPLE) ×4
STAPLER RELOAD 2.5X45 WHITE (STAPLE) ×6
STAPLER RELOAD 2.5X45 WHT DVNC (STAPLE) ×6
STAPLER RELOAD 3.5X45 BLU DVNC (STAPLE) ×30
STAPLER RELOAD 3.5X45 BLUE (STAPLE) ×30
STAPLER RELOAD 4.3X45 GREEN (STAPLE) ×14
STAPLER RELOAD 4.3X45 GRN DVNC (STAPLE) ×14
STOPCOCK 4 WAY LG BORE MALE ST (IV SETS) ×4 IMPLANT
SUT PDS AB 3-0 SH 27 (SUTURE) IMPLANT
SUT PROLENE 4 0 RB 1 (SUTURE)
SUT PROLENE 4-0 RB1 .5 CRCL 36 (SUTURE) IMPLANT
SUT SILK  1 MH (SUTURE) ×8
SUT SILK 1 MH (SUTURE) ×4 IMPLANT
SUT SILK 1 TIES 10X30 (SUTURE) ×4 IMPLANT
SUT SILK 2 0 SH (SUTURE) IMPLANT
SUT SILK 2 0SH CR/8 30 (SUTURE) IMPLANT
SUT SILK 3 0 SH 30 (SUTURE) IMPLANT
SUT SILK 3 0SH CR/8 30 (SUTURE) IMPLANT
SUT VIC AB 1 CTX 36 (SUTURE) ×4
SUT VIC AB 1 CTX36XBRD ANBCTR (SUTURE) IMPLANT
SUT VIC AB 2-0 CTX 36 (SUTURE) ×2 IMPLANT
SUT VIC AB 3-0 MH 27 (SUTURE) IMPLANT
SUT VIC AB 3-0 X1 27 (SUTURE) ×10 IMPLANT
SUT VICRYL 0 TIES 12 18 (SUTURE) ×4 IMPLANT
SUT VICRYL 0 UR6 27IN ABS (SUTURE) ×8 IMPLANT
SUT VICRYL 2 TP 1 (SUTURE) IMPLANT
SYR 10ML LL (SYRINGE) ×4 IMPLANT
SYR 20ML ECCENTRIC (SYRINGE) ×2 IMPLANT
SYR 30ML LL (SYRINGE) ×4 IMPLANT
SYR 50ML LL SCALE MARK (SYRINGE) ×4 IMPLANT
SYSTEM SAHARA CHEST DRAIN ATS (WOUND CARE) ×4 IMPLANT
TAPE CLOTH 4X10 WHT NS (GAUZE/BANDAGES/DRESSINGS) ×4 IMPLANT
TIP APPLICATOR SPRAY EXTEND 16 (VASCULAR PRODUCTS) IMPLANT
TOWEL GREEN STERILE (TOWEL DISPOSABLE) ×4 IMPLANT
TOWEL GREEN STERILE FF (TOWEL DISPOSABLE) ×4 IMPLANT
TRAY FOLEY MTR SLVR 16FR STAT (SET/KITS/TRAYS/PACK) ×4 IMPLANT
TRAY WAYNE PNEUMOTHORAX 14X18 (TRAY / TRAY PROCEDURE) ×2 IMPLANT
TROCAR BLADELESS 12MM (ENDOMECHANICALS) ×4 IMPLANT
TROCAR XCEL BLADELESS 5X75MML (TROCAR) IMPLANT
TUBING EXTENTION W/L.L. (IV SETS) ×4 IMPLANT
WATER STERILE IRR 1000ML POUR (IV SOLUTION) ×4 IMPLANT

## 2019-05-28 NOTE — Anesthesia Procedure Notes (Addendum)
Arterial Line Insertion Start/End2/04/2019 6:50 AM, 05/28/2019 7:02 AM Performed by: Roberts Gaudy, MD, Inda Coke, CRNA, CRNA  Patient location: Pre-op. Preanesthetic checklist: patient identified, IV checked, site marked, risks and benefits discussed, surgical consent, monitors and equipment checked, pre-op evaluation, timeout performed and anesthesia consent Lidocaine 1% used for infiltration Left, radial was placed Catheter size: 20 G Hand hygiene performed , maximum sterile barriers used  and Seldinger technique used  Attempts: 1 Procedure performed without using ultrasound guided technique. Following insertion, dressing applied and Biopatch. Post procedure assessment: normal and unchanged  Patient tolerated the procedure well with no immediate complications.

## 2019-05-28 NOTE — Anesthesia Procedure Notes (Signed)
Central Venous Catheter Insertion Performed by: Annye Asa, MD, anesthesiologist Start/End2/04/2019 6:55 AM, 05/28/2019 7:08 AM Preanesthetic checklist: patient identified, IV checked, site marked, risks and benefits discussed, surgical consent, monitors and equipment checked, pre-op evaluation, timeout performed and anesthesia consent Position: Trendelenburg Lidocaine 1% used for infiltration and patient sedated Hand hygiene performed , maximum sterile barriers used  and Seldinger technique used Catheter size: 8 Fr Central line was placed.Double lumen Procedure performed using ultrasound guided technique. Ultrasound Notes:anatomy identified, needle tip was noted to be adjacent to the nerve/plexus identified, no ultrasound evidence of intravascular and/or intraneural injection and image(s) printed for medical record Attempts: 1 Following insertion, line sutured, dressing applied and Biopatch. Post procedure assessment: blood return through all ports, free fluid flow and no air  Patient tolerated the procedure well with no immediate complications. Additional procedure comments: CVP: Timeout, sterile prep, drape, FBP R neck.  Trendelenburg position.  1% lido local, finder and trocar RIJ 1st pass with US guidance.  2 lumen placed over J wire. Biopatch and sterile dressing on.  Patient tolerated well.  VSS.  Jenita Seashore, MD.

## 2019-05-28 NOTE — Brief Op Note (Addendum)
05/28/2019  12:22 PM  PATIENT:  [name]  69 y.o. female  PRE-OPERATIVE DIAGNOSIS:  RUL NODULE  POST-OPERATIVE DIAGNOSIS:  Non-small cell carcinoma Right upper lobe- Clinical stage IA(T1N0)  PROCEDURE:  Procedure(s): XI ROBOTIC ASSISTED THORASCOPY-WEDGE RESECTION AND  RIGHT  UPPER LOBECTOMY (Right) Intercostal Nerve Block (Right) Node Dissection  SURGEON:  Surgeon(s) and Role:    * Melrose Nakayama, MD - Primary    * Lightfoot, Lucile Crater, MD - Assisting  PHYSICIAN ASSISTANT: WAYNE GOLD PA-C  ANESTHESIA:   general  EBL:  250 mL   BLOOD ADMINISTERED:none  DRAINS: 1 PIGTAIL AND 1 OLEURAL CHEST TUBE   LOCAL MEDICATIONS USED: EXPAREL   SPECIMEN:  Source of Specimen:  RUL WEDGE AND COMPLETION LOBECTOMY, LN SAMPLING  DISPOSITION OF SPECIMEN:  PATHOLOGY  COUNTS:  YES  TOURNIQUET:  * No tourniquets in log *  DICTATION: .Other Dictation: Dictation Number [id number]  PLAN OF CARE: Admit to inpatient   PATIENT DISPOSITION:  PACU - hemodynamically stable.   Delay start of Pharmacological VTE agent (>24hrs) due to surgical blood loss or risk of bleeding: yes  COMPLICATIONS: NO KNOWN  Frozen showed non-small cell carcinoma

## 2019-05-28 NOTE — Transfer of Care (Signed)
Immediate Anesthesia Transfer of Care Note  Patient: [name]  Procedure(s) Performed: XI ROBOTIC ASSISTED THORASCOPY-WEDGE RESECTION AND  RIGHT  UPPER LOBECTOMY (Right Chest) Intercostal Nerve Block (Right Chest) Node Dissection (Chest)  Patient Location: PACU  Anesthesia Type:General  Level of Consciousness: awake and alert   Airway & Oxygen Therapy: Patient Spontanous Breathing and Patient connected to nasal cannula oxygen  Post-op Assessment: Report given to RN and Post -op Vital signs reviewed and stable  Post vital signs: Reviewed and stable  Last Vitals:  Vitals Value Taken Time  BP 85/49 05/28/19 1305  Temp    Pulse 68 05/28/19 1312  Resp 17 05/28/19 1312  SpO2 98 % 05/28/19 1312  Vitals shown include unvalidated device data.  Last Pain:  Vitals:   05/28/19 0617  TempSrc:   PainSc: 0-No pain         Complications: No apparent anesthesia complications

## 2019-05-28 NOTE — Interval H&P Note (Signed)
History and Physical Interval Note:  05/28/2019 7:21 AM  [name]  has presented today for surgery, with the diagnosis of RUL NODULE.  The various methods of treatment have been discussed with the patient and family. After consideration of risks, benefits and other options for treatment, the patient has consented to  Procedure(s): XI ROBOTIC ASSISTED THORASCOPY-WEDGE RESECTION, POSSIBLE UPPER LOBECTOMY (Right) as a surgical intervention.  The patient's history has been reviewed, patient examined, no change in status, stable for surgery.  I have reviewed the patient's chart and labs.  Questions were answered to the patient's satisfaction.     Melrose Nakayama

## 2019-05-28 NOTE — Progress Notes (Signed)
Spoke with Dr. Roxan Hockey, stated pt. Did not need another cxr this am.

## 2019-05-28 NOTE — Anesthesia Procedure Notes (Signed)
Procedure Name: Intubation Date/Time: 05/28/2019 7:51 AM Performed by: Inda Coke, CRNA Pre-anesthesia Checklist: Patient identified, Emergency Drugs available, Suction available and Patient being monitored Patient Re-evaluated:Patient Re-evaluated prior to induction Oxygen Delivery Method: Circle System Utilized Preoxygenation: Pre-oxygenation with 100% oxygen Induction Type: IV induction Ventilation: Mask ventilation without difficulty and Oral airway inserted - appropriate to patient size Laryngoscope Size: Mac and 3 Grade View: Grade I Tube type: Oral Endobronchial tube: Left, Double lumen EBT, EBT position confirmed by auscultation and EBT position confirmed by fiberoptic bronchoscope and 35 Fr Number of attempts: 1 Airway Equipment and Method: Stylet Placement Confirmation: ETT inserted through vocal cords under direct vision,  positive ETCO2 and breath sounds checked- equal and bilateral Secured at: 29 cm Tube secured with: Tape Dental Injury: Teeth and Oropharynx as per pre-operative assessment

## 2019-05-28 NOTE — Progress Notes (Signed)
Desats to low 80's on 2l [place] while eating, encouraged deep inhalation. O2 increased to 4L [place] pulse ox -95%. Continue to monitor.

## 2019-05-28 NOTE — Progress Notes (Signed)
Transferred- in from PACU by bed awake and alert.

## 2019-05-29 ENCOUNTER — Encounter: Payer: Self-pay | Admitting: *Deleted

## 2019-05-29 ENCOUNTER — Inpatient Hospital Stay (HOSPITAL_COMMUNITY): Payer: Medicare PPO

## 2019-05-29 LAB — CBC
HCT: 30.4 % — ABNORMAL LOW (ref 36.0–46.0)
Hemoglobin: 10.3 g/dL — ABNORMAL LOW (ref 12.0–15.0)
MCH: 33.9 pg (ref 26.0–34.0)
MCHC: 33.9 g/dL (ref 30.0–36.0)
MCV: 100 fL (ref 80.0–100.0)
Platelets: 161 10*3/uL (ref 150–400)
RBC: 3.04 MIL/uL — ABNORMAL LOW (ref 3.87–5.11)
RDW: 12.7 % (ref 11.5–15.5)
WBC: 8.6 10*3/uL (ref 4.0–10.5)
nRBC: 0 % (ref 0.0–0.2)

## 2019-05-29 LAB — BLOOD GAS, ARTERIAL
Acid-Base Excess: 14.4 mmol/L — ABNORMAL HIGH (ref 0.0–2.0)
Bicarbonate: 39.2 mmol/L — ABNORMAL HIGH (ref 20.0–28.0)
FIO2: 32
O2 Saturation: 69.3 %
Patient temperature: 37
pCO2 arterial: 54.8 mmHg — ABNORMAL HIGH (ref 32.0–48.0)
pH, Arterial: 7.468 — ABNORMAL HIGH (ref 7.350–7.450)
pO2, Arterial: 38.6 mmHg — CL (ref 83.0–108.0)

## 2019-05-29 LAB — BASIC METABOLIC PANEL
Anion gap: 9 (ref 5–15)
BUN: 7 mg/dL — ABNORMAL LOW (ref 8–23)
CO2: 23 mmol/L (ref 22–32)
Calcium: 7.9 mg/dL — ABNORMAL LOW (ref 8.9–10.3)
Chloride: 102 mmol/L (ref 98–111)
Creatinine, Ser: 0.54 mg/dL (ref 0.44–1.00)
GFR calc Af Amer: >60 mL/min (ref >60)
GFR calc non Af Amer: >60 mL/min (ref >60)
Glucose, Bld: 138 mg/dL — ABNORMAL HIGH (ref 70–99)
Potassium: 3.3 mmol/L — ABNORMAL LOW (ref 3.5–5.1)
Sodium: 134 mmol/L — ABNORMAL LOW (ref 135–145)

## 2019-05-29 LAB — SURGICAL PATHOLOGY

## 2019-05-29 MED ORDER — PROMETHAZINE HCL 25 MG/ML IJ SOLN
12.5000 mg | Freq: Four times a day (QID) | INTRAMUSCULAR | Status: DC | PRN
  Administered 2019-05-29 – 2019-06-01 (×3): 12.5 mg via INTRAVENOUS
  Filled 2019-05-29 (×3): qty 1

## 2019-05-29 MED ORDER — SODIUM CHLORIDE 0.9% FLUSH
10.0000 mL | Freq: Two times a day (BID) | INTRAVENOUS | Status: DC
  Administered 2019-05-29 – 2019-06-13 (×22): 10 mL

## 2019-05-29 MED ORDER — POTASSIUM CHLORIDE CRYS ER 20 MEQ PO TBCR
30.0000 meq | EXTENDED_RELEASE_TABLET | Freq: Two times a day (BID) | ORAL | Status: DC
  Administered 2019-05-29 (×2): 30 meq via ORAL
  Filled 2019-05-29 (×2): qty 1

## 2019-05-29 MED ORDER — FENTANYL 50 MCG/ML IV PCA SOLN
INTRAVENOUS | Status: DC
  Administered 2019-05-29: 10 ug via INTRAVENOUS
  Administered 2019-05-29: 0 ug via INTRAVENOUS
  Administered 2019-05-30 (×2): 10 ug via INTRAVENOUS

## 2019-05-29 MED ORDER — ALUM & MAG HYDROXIDE-SIMETH 200-200-20 MG/5ML PO SUSP
30.0000 mL | Freq: Four times a day (QID) | ORAL | Status: DC | PRN
  Administered 2019-05-29 – 2019-06-09 (×6): 30 mL via ORAL
  Filled 2019-05-29 (×6): qty 30

## 2019-05-29 MED ORDER — FENTANYL 50 MCG/ML IV PCA SOLN
INTRAVENOUS | Status: DC
  Filled 2019-05-29: qty 20

## 2019-05-29 MED ORDER — ALBUTEROL SULFATE (2.5 MG/3ML) 0.083% IN NEBU
2.5000 mg | INHALATION_SOLUTION | Freq: Three times a day (TID) | RESPIRATORY_TRACT | Status: DC
  Administered 2019-05-29 – 2019-06-02 (×12): 2.5 mg via RESPIRATORY_TRACT
  Filled 2019-05-29 (×12): qty 3

## 2019-05-29 MED ORDER — SODIUM CHLORIDE 0.9% FLUSH: 10.0000 mL | INTRAVENOUS | Status: DC | PRN

## 2019-05-29 MED ORDER — POTASSIUM CHLORIDE 10 MEQ/50ML IV SOLN
10.0000 meq | INTRAVENOUS | Status: AC
  Administered 2019-05-29 (×2): 10 meq via INTRAVENOUS
  Filled 2019-05-29 (×2): qty 50

## 2019-05-29 NOTE — Discharge Summary (Addendum)
Physician Discharge Summary       [address]       [place] [address]             [phone]    Patient ID: [name] MRN: [id number] DOB/AGE: [birth year] 69 y.o.  Admit date: 05/28/2019 Discharge date: 06/13/2019  Admission Diagnoses: RUL nodule  Discharge Diagnoses:  1. Non small carcinoma RUL 2. S/P lobectomy of lung 3. Expected ABL anemia 4. Postoperative ileus 5. History of tobacco abuse 6. History of dyslipidemia 7. History of hypertension 8. History of anxiety 9. History of endometriosis 10. History of osteopenia 11. History of migraine 12. History of cervical dysplasia   Consults: GI  Procedure (s): XI robotic assisted right thoracoscopy, wedge resection followed by RUL, lymph node dissection, and intercostal nerve block by Dr. Roxan Hockey and Dr. Kipp Brood on 05/28/2019.  Pathology: MICROSCOPIC DIAGNOSIS:   A. LUNG, RIGHT UPPER LOBE, WEDGE RESECTION:  - Invasive adenocarcinoma, well differentiated, spanning 1.6 cm.  - Tumor is limited to lung.  - Final resection margin is negative.  - See oncology table.   B. LYMPH NODE, LEVEL 9R, EXCISION:  - One of one lymph nodes negative for carcinoma (0/1).   C. LYMPH NODE, LEVEL 7, EXCISION:  - One of one lymph nodes negative for carcinoma (0/1).   D. LYMPH NODE, LEVEL 7 #2, EXCISION:  - One of one lymph nodes negative for carcinoma (0/1).   E. LYMPH NODE, LEVEL 7 #3, EXCISION:  - One of one lymph nodes negative for carcinoma (0/1).   F. LYMPH NODE, LEVEL 11, EXCISION:  - One of one lymph nodes negative for carcinoma (0/1).   G. LYMPH NODE, LEVEL 12, EXCISION:  - One of one lymph nodes negative for carcinoma (0/1).   H. LYMPH NODE, LEVEL 10R, EXCISION:  - One of one lymph nodes negative for carcinoma (0/1).   I. LYMPH NODE, LEVEL 4R, EXCISION:  - One of one lymph nodes negative for carcinoma (0/1).   J. LYMPH NODE, LEVEL 10R #2, EXCISION:  - One of one lymph nodes negative  for carcinoma (0/1).   K. LYMPH NODE, LEVEL 4R #2, EXCISION:  - One of one lymph nodes negative for carcinoma (0/1).   L. LYMPH NODE, LEVEL 10R #3, EXCISION:  - One of one lymph nodes negative for carcinoma (0/1).   M. LYMPH NODE, LEVEL 12R #2, EXCISION:  - One of one lymph nodes negative for carcinoma (0/1).   N. LUNG, RIGHT UPPER LOBE, LOBECTOMY:  - Benign lung tissue with emphysematous change.  - Bronchial and vascular margins are negative.  - One of one lymph nodes negative for carcinoma (0/1).  TNM Code: pT1b, pN0   History of Presenting Illness: [name] is a 69 year old woman with a history of tobacco abuse, COPD, anxiety, hypertension, dyslipidemia, endometriosis, and migraines.  She recently saw Dr. Debara Pickett regarding her hypertension.  In reviewing her chart he noted she had a lung nodules on a CT back in 2012.  Given her smoking history she was a candidate for low-dose CT screening.  A CT of the chest showed a 1.3 cm mixed density nodule in the posterior lateral right upper lobe.  There was no hilar or mediastinal adenopathy.  Dr. Roxan Hockey initially saw her in the office on 05/01/2019.  She was felt to be a good candidate for surgical resection but her PET scan had not yet been done.  She returned to the office on  05/09/2019 to discuss  the results of the PET and further decide how she would like to proceed. Dr. Roxan Hockey recommended to [name] and her son that we proceed with a robotic right VATS for wedge resection to be followed by right upper lobectomy if the intraoperative frozen section is positive for cancer.  If the lesion is benign, we would not need to do any additional resection.   Dr. Roxan Hockey described the general nature of the procedure to them.  They understand this would be a minimally invasive approach requiring multiple small incisions.  We would plan to do the procedure under general anesthesia.  We discussed the use of drains to postoperatively, the  expected hospital stay, and the overall recovery.  Dr. Roxan Hockey informed themm of the indications, risks, benefits, and alternatives.  They understand and agreed to proceed with surgery. She presented to [facility] on [facility]/01 in order to undergo XI robotic assisted right thoracoscopy, wedge resection followed by RUL, lymph node dissection, and intercostal nerve block.  Brief Hospital Course:  The patient remained afebrile and hemodynamically stable. A line and foley were removed early in the post operative course. Patient had complaints of tongue soreness (she bit her tongue at some point) and numbness. Anesthesiology did see the patient on 02/03. These post-operative issues possibly resulted from the presence of the double lumen tube for six hours during surgery. Oral care and potential outpatient ENT follow-up was recommended, if anterior tongue numbness presents. Patient also had complaints of swallowing difficulties, nausea, and vomiting. She was given Reglan. KUB done 02/04 showed probable post operative ileus. Her diet was decreased to clear liquids. Speech pathology evaluated her 02/04 and a thin liquid diet was recommended.  Chest tube output gradually decreased,  there was a small air leak.  This resolved and her chest tube was removed.  She developed a mild pneumothorax post chest tube removal.  Due to this previously placed pigtail catheter was placed to suction. Chest tube was placed to water seal on 05/29/19. Chest tube continued to have an air leak and chest  x ray on 02/05 showed right apical space.  Follow up chest x ray showed stable appearance of right apical space. We did a trial of clamping her chest tube.  Repeat CXR remained stable but unfortunately once tube was unclamped air leak redeveloped.  Once air leak resolved, the chest tube was removed on 06/11/2019.  Follow up CXR showed stable appearance of right apical space.   The patient developed issues with dysphagia with any oral intake.   She was started on Carafate and Magic Mouthwash.  She ultimately was evaluated by GI who performed an EGD which showed esophagitis.  The patient symptoms improved and she was able to tolerate oral intake.  She developed severe hypokalemia and hyponatremia.  She was aggressively supplemented and started on folic acid.  She developed wide complex tachycardia and episodic complete heart block.  EP consult was obtained they recommended complete ischemic workup.  However the patient declined workup at this time.  She again developed abdominal distention with decreased bowel movements.  General surgery consult was obtained and they recommended increase mobility.  They recommended continuing electrolyte replacement.    Patient has been requiring several liters of oxygen via [place].  She has been weaned to 2L but will require oxygen at discharge.  Wounds are clean and dry.  Her hypokalemia resolved with most recent level being 3.6.  She remains hyponatremic at 126 but is asymptomatic.  She will require follow up  with her family physician. Patient is felt surgically stable for discharge today.  Latest Vital Signs: Blood pressure 120/80, pulse 86, temperature (!) 97.5 F (36.4 C), temperature source Oral, resp. rate 16, height 5\' 4"  (1.626 m), weight 49 kg, SpO2 90 %.  Physical Exam:  General appearance: alert, cooperative and no distress Heart: regular rate and rhythm Lungs: clear to auscultation bilaterally Abdomen: soft, non-tender; mild distention, bowel sounds normal; no masses,  no organomegaly Extremities: extremities normal, atraumatic, no cyanosis or edema Wound: clean and dry  Discharge Condition: Stable and discharged to home.  Recent laboratory studies:  Lab Results  Component Value Date   WBC 10.4 06/04/2019   HGB 10.1 (L) 06/04/2019   HCT 30.0 (L) 06/04/2019   MCV 100.3 (H) 06/04/2019   PLT 263 06/04/2019   Lab Results  Component Value Date   NA 126 (L) 06/13/2019   K 3.6 06/13/2019    CL 88 (L) 06/13/2019   CO2 29 06/13/2019   CREATININE 0.42 (L) 06/13/2019   GLUCOSE 93 06/13/2019    Diagnostic Studies:  1. Mild hypermetabolism corresponding to the posterior right upper lobe nodular opacity. Although this could represent an area of post infectious/inflammatory scarring, indolent neoplasm cannot be excluded. 2. No hypermetabolic thoracic nodes. 3. Right tenth rib likely subacute non healed fracture with hypermetabolism. Hypermetabolism within either the anterior first right rib or adjacent medial right clavicle is without CT correlate, but also favored to be posttraumatic. 4. Incidental findings, including: Aortic atherosclerosis (ICD10-I70.0), coronary artery atherosclerosis and emphysema (ICD10-J43.9). Ascending aortic aneurysm of 4.1 cm.  Pathology:  SURGICAL PATHOLOGY  CASE: [id number]  PATIENT: [name]  Surgical Pathology Report   Clinical History: RUL nodule (cm)   FINAL MICROSCOPIC DIAGNOSIS:   A. LUNG, RIGHT UPPER LOBE, WEDGE RESECTION:  - Invasive adenocarcinoma, well differentiated, spanning 1.6 cm.  - Tumor is limited to lung.  - Final resection margin is negative.  - See oncology table.   B. LYMPH NODE, LEVEL 9R, EXCISION:  - One of one lymph nodes negative for carcinoma (0/1).   C. LYMPH NODE, LEVEL 7, EXCISION:  - One of one lymph nodes negative for carcinoma (0/1).   D. LYMPH NODE, LEVEL 7 #2, EXCISION:  - One of one lymph nodes negative for carcinoma (0/1).   E. LYMPH NODE, LEVEL 7 #3, EXCISION:  - One of one lymph nodes negative for carcinoma (0/1).   F. LYMPH NODE, LEVEL 11, EXCISION:  - One of one lymph nodes negative for carcinoma (0/1).   G. LYMPH NODE, LEVEL 12, EXCISION:  - One of one lymph nodes negative for carcinoma (0/1).   H. LYMPH NODE, LEVEL 10R, EXCISION:  - One of one lymph nodes negative for carcinoma (0/1).   I. LYMPH NODE, LEVEL 4R, EXCISION:  - One of one lymph nodes negative for carcinoma  (0/1).   J. LYMPH NODE, LEVEL 10R #2, EXCISION:  - One of one lymph nodes negative for carcinoma (0/1).   K. LYMPH NODE, LEVEL 4R #2, EXCISION:  - One of one lymph nodes negative for carcinoma (0/1).   L. LYMPH NODE, LEVEL 10R #3, EXCISION:  - One of one lymph nodes negative for carcinoma (0/1).   M. LYMPH NODE, LEVEL 12R #2, EXCISION:  - One of one lymph nodes negative for carcinoma (0/1).   N. LUNG, RIGHT UPPER LOBE, LOBECTOMY:  - Benign lung tissue with emphysematous change.  - Bronchial and vascular margins are negative.  - One of one lymph  nodes negative for carcinoma (0/1).   A. STOMACH, ANTRUM, BIOPSY:  - Gastric antral mucosa with mild reactive gastropathy.  - Warthin-Starry stain is negative for Helicobacter pylori.   DG Chest 2 View  Result Date: 06/13/2019 CLINICAL DATA:  Status post chest tube removal EXAM: CHEST - 2 VIEW COMPARISON:  06/12/2019, 06/11/2019 FINDINGS: There is a redemonstrated small volume right-sided hydropneumothorax with an air-fluid level at the right apex. Small, layering bilateral pleural effusions at the lung bases. No acute appearing airspace opacity. Right upper extremity PICC. The heart and mediastinum are unremarkable. IMPRESSION: 1. There is a redemonstrated small volume right-sided hydropneumothorax with an air-fluid level at the right apex. 2.  Small, layering bilateral pleural effusions at the lung bases. 3.  No acute appearing airspace opacities. Electronically Signed   By: Eddie Candle [name]   On: 06/13/2019 09:35   DG Chest 2 View  Result Date: 06/12/2019 CLINICAL DATA:  Post lobectomy EXAM: CHEST - 2 VIEW COMPARISON:  06/11/2019 FINDINGS: Post right upper lobectomy. Increased lucency at the right apex likely reflects air within the lobectomy site. Mild bibasilar atelectasis. Small pleural effusions. Normal heart size. Stable position of right PICC. IMPRESSION: Increased lucency at the right apex likely reflects air within the lobectomy site.  Bibasilar atelectasis and small pleural effusions. Electronically Signed   By: Macy Mis [name]   On: 06/12/2019 08:50   DG Chest 2 View  Result Date: 06/08/2019 CLINICAL DATA:  Evaluate chest tube. History of right upper lobectomy. EXAM: CHEST - 2 VIEW COMPARISON:  06/07/2019 FINDINGS: Right pigtail chest tube is stable in position. There continues to be a right apical pneumothorax which is mildly complex and unchanged. Stable densities in the right hilum with postoperative changes. Stable volume loss in the right hemithorax. Few densities at the left lung base and suspect atelectasis and small left pleural effusion. Atherosclerotic calcifications at the aortic arch. Stable appearance of the heart and mediastinum. IMPRESSION: 1. Stable appearance of the right apical pneumothorax. Stable position of the right chest tube. 2. Atelectasis and probable small effusion at the left lung base. 3. Postsurgical changes and volume loss in the right hemithorax. Electronically Signed   By: Markus Daft [name]   On: 06/08/2019 08:45   DG Chest 2 View  Result Date: 05/24/2019 CLINICAL DATA:  Pre-op exam for nodule of upper lobe of Right lung. Pt denies any chest complaints such as cough or SOB. Pt is a current smoker. Pt hx HTN. Pt was tested today for COVID EXAM: CHEST - 2 VIEW COMPARISON:  06/28/2010 FINDINGS: Cardiac silhouette is normal in size and configuration. No mediastinal or hilar masses. There is no evidence of adenopathy. Lungs are hyperexpanded. Mild interstitial thickening noted in the peripheral bases. Relative lucency noted in the upper lobes consistent with emphysema. Vague opacity noted in the peripheral right upper lobe corresponds to the pleural based irregular nodule noted on the prior CT from 04/17/2019. Lungs otherwise clear. No pleural effusion or pneumothorax. Skeletal structures are intact. IMPRESSION: 1. No acute cardiopulmonary disease. 2. COPD. Pleural based right upper lobe nodule noted on the  prior CT is vaguely seen radiographically. Electronically Signed   By: Lajean Manes [name]   On: 05/24/2019 11:42   DG Abd 1 View  Result Date: 06/03/2019 CLINICAL DATA:  69 year old female postoperative day 6 from right lung VATS. Ileus. EXAM: ABDOMEN - 1 VIEW COMPARISON:  Portable abdomen 06/02/2019 and earlier. FINDINGS: Portable AP supine view at 0428 hours. A pelvic tube, probably  a rectal tube, is now in place. Mildly improved bowel gas pattern since yesterday, although there remain mildly dilated gas-filled small bowel loops in the mid abdomen. There is large bowel gas, to the descending colon. Stable cholecystectomy clips. No enteric tube identified. Extensive Aortoiliac calcified atherosclerosis. No acute osseous abnormality identified. IMPRESSION: 1. Improved but not resolved ileus gas pattern since yesterday. 2.  Aortic Atherosclerosis (ICD10-I70.0). Electronically Signed   By: Genevie Ann [name]   On: 06/03/2019 06:40   DG Abd 1 View  Result Date: 05/31/2019 CLINICAL DATA:  Abdominal pain and distension, postop from right upper lobe wedge resection 05/28/2019 EXAM: ABDOMEN - 1 VIEW COMPARISON:  10/11/2016 FINDINGS: 2 supine frontal views of the abdomen and pelvis demonstrate distended gas-filled loops of small bowel within the right mid abdomen measuring up to 3.7 cm in diameter. There is a paucity of colonic gas. There is elevation of the right hemidiaphragm. Otherwise lung bases are clear. IMPRESSION: 1. Nonspecific gaseous distention of the small bowel, which likely reflect postoperative ileus. Serial radiographic follow-up recommended. 2. Elevated right hemidiaphragm. Electronically Signed   By: Randa Ngo [name]   On: 05/31/2019 08:52   DG Chest 1V REPEAT Same Day  Result Date: 06/11/2019 CLINICAL DATA:  Chest tube removal.  Post lobectomy. EXAM: CHEST - 1 VIEW SAME DAY COMPARISON:  06/11/2019 FINDINGS: Right chest tube removed.  No pneumothorax Right arm PICC tip in the lower SVC unchanged.  Postsurgical changes right upper lobe with apical pleural thickening. This is unchanged. Mild bibasilar atelectasis.  Small effusions.  Negative for edema. IMPRESSION: Right chest tube removed.  No pneumothorax. Electronically Signed   By: Franchot Gallo [name]   On: 06/11/2019 13:37   DG Chest 1V REPEAT Same Day  Result Date: 06/07/2019 CLINICAL DATA:  Post lobectomy EXAM: CHEST - 1 VIEW SAME DAY COMPARISON:  Portable exam at 1233 hrs compared to 0629 hrs FINDINGS: Pigtail RIGHT thoracostomy tube again identified. Normal heart size, mediastinal contours, and pulmonary vascularity. Atherosclerotic calcification aorta. Postoperative changes with staple line and atelectasis in RIGHT perihilar region and RIGHT upper lobe. Persistent RIGHT apex pneumothorax little changed in size since prior study. At underlying emphysematous changes with mild atelectasis and effusion at LEFT base again seen. Bones demineralized. IMPRESSION: Persistent RIGHT apex pneumothorax despite RIGHT thoracostomy tube. Changes of COPD with postoperative changes in the RIGHT upper lobe. Subsegmental atelectasis and small pleural effusion at LEFT base. Electronically Signed   By: Lavonia Dana [name]   On: 06/07/2019 12:49   DG CHEST PORT 1 VIEW  Result Date: 06/11/2019 CLINICAL DATA:  Follow-up chest tube EXAM: PORTABLE CHEST 1 VIEW COMPARISON:  06/10/2019 FINDINGS: Pigtail catheter is again noted in the right lung base stable from the prior exam. Right-sided PICC line is again seen at the cavoatrial junction. Cardiac shadow is stable. Stable right apical pneumothorax is noted with slight increased density which may represent some developing effusion. No new focal infiltrate is seen. Persistent left basilar atelectasis is noted. IMPRESSION: Slight increased density in the right apex which may be related to a developing effusion in the area of previous pneumothorax. The degree of right lung aeration is stable. The remainder of the exam is stable.  Electronically Signed   By: Inez Catalina [name]   On: 06/11/2019 08:10   DG CHEST PORT 1 VIEW  Result Date: 06/10/2019 CLINICAL DATA:  Follow-up chest tube EXAM: PORTABLE CHEST 1 VIEW COMPARISON:  06/09/2019 FINDINGS: Cardiac shadow is stable. Aortic calcifications are again seen.  Right-sided PICC line is again noted and stable as is a right-sided chest tube. Persistent right apical pneumothorax is seen. Postsurgical changes on the right are noted with volume loss. No new focal infiltrate or sizable effusion is seen. Mild left basilar atelectasis remains. IMPRESSION: Overall stable appearance when compared with the prior exam with right apical pneumothorax and left basilar atelectasis. Electronically Signed   By: Inez Catalina [name]   On: 06/10/2019 09:19   DG CHEST PORT 1 VIEW  Result Date: 06/09/2019 CLINICAL DATA:  Pneumothorax. EXAM: PORTABLE CHEST 1 VIEW COMPARISON:  06/08/2019 FINDINGS: Sequelae right upper lobectomy are again identified. A right PICC has been placed and terminates near the superior cavoatrial junction. A right chest tube remains in place. The cardiomediastinal silhouette is unchanged with normal heart size and asymmetric right hilar densities which may be postsurgical. A small right apical pneumothorax has minimally enlarged. Left basilar opacities are unchanged and likely reflect a small pleural effusion and atelectasis. IMPRESSION: 1. Minimally increased size of small right apical pneumothorax. 2. Unchanged left basilar atelectasis and small pleural effusion. Electronically Signed   By: Logan Bores [name]   On: 06/09/2019 10:18   DG CHEST PORT 1 VIEW  Result Date: 06/07/2019 CLINICAL DATA:  Follow-up right chest tube EXAM: PORTABLE CHEST 1 VIEW COMPARISON:  06/06/2019 FINDINGS: Cardiac shadow is stable. Right jugular central line is been removed in the interval. Right chest tube is again seen with persistent apical pneumothorax. Postsurgical changes in the right hilum are noted. No  new focal abnormality on the right is seen. Minimal left pleural effusion is noted. IMPRESSION: No significant change from the prior exam. Electronically Signed   By: Inez Catalina [name]   On: 06/07/2019 09:33   DG CHEST PORT 1 VIEW  Result Date: 06/06/2019 CLINICAL DATA:  Chest tube placement.  Sore chest. EXAM: PORTABLE CHEST 1 VIEW COMPARISON:  06/05/2019 FINDINGS: Right IJ central venous catheter unchanged. Right pleural pigtail drainage catheter unchanged. Moderate postsurgical changes over the right perihilar region and mid to upper lung with stable small to moderate size right apical pneumothorax. Minimal linear atelectasis left base. Possible tiny amount left pleural fluid improved. Cardiomediastinal silhouette and remainder of the exam is unchanged. IMPRESSION: 1. Postsurgical changes over the right perihilar region and mid to upper lung with stable small to moderate right apical pneumothorax. Right-sided chest tube unchanged. 2. Minimal linear atelectasis left base with tiny amount left pleural fluid slightly improved. Electronically Signed   By: Marin Olp [name]   On: 06/06/2019 09:45   DG CHEST PORT 1 VIEW  Result Date: 06/05/2019 CLINICAL DATA:  Chest tube EXAM: PORTABLE CHEST 1 VIEW COMPARISON:  Yesterday FINDINGS: Postoperative right lung with apical pneumothorax measuring 3.5 cm craniocaudal, stable. Stable chest tube positioning at the base. Right IJ catheter with tip at the upper cavoatrial junction. Streaky opacity at the left lung base. Emphysema. IMPRESSION: 1. Unchanged right apical pneumothorax. 2. Mild progression of left base atelectasis. Electronically Signed   By: Monte Fantasia [name]   On: 06/05/2019 09:30   DG CHEST PORT 1 VIEW  Result Date: 06/04/2019 CLINICAL DATA:  69 year old female postoperative day 7 right upper lobectomy for non-small cell carcinoma. EXAM: PORTABLE CHEST 1 VIEW COMPARISON:  Portable chest 06/03/2019 and earlier. FINDINGS: Portable AP semi upright view at  0513 hours. Stable pigtail type right chest tube. Stable right pneumothorax. Improved right lung base ventilation since yesterday. Stable right IJ central line. Stable cardiac size and mediastinal contours. Larger left  lung volume and mildly improved left lung base ventilation also. No new pulmonary opacity. IMPRESSION: 1. Stable right chest tube and right pneumothorax following right upper lobectomy. 2. Larger lung volumes and improved bibasilar ventilation. Electronically Signed   By: Genevie Ann [name]   On: 06/04/2019 06:04   DG CHEST PORT 1 VIEW  Result Date: 06/03/2019 CLINICAL DATA:  69 year old female status post right lung VATS, right upper lobectomy postoperative day 6. Right chest tubes. EXAM: PORTABLE CHEST 1 VIEW COMPARISON:  06/02/2019 portable chest and earlier. FINDINGS: Portable AP semi upright view at 0427 hours. The large caliber right chest tube has been removed. The smaller pigtail type chest tube remains in place. Stable mild to moderate sized right apical pneumothorax since yesterday, pleural edge is visible to the right lateral 3/4 rib interface. Other vertical tubes continue to project over the right chest near midline of unclear significance. Improved right lung volume since yesterday. Stable cardiac size and mediastinal contours. Crowding of chronic pulmonary interstitial markings. No definite edema or pleural effusion. Negative visible bowel gas pattern. Stable visualized osseous structures. IMPRESSION: 1. One right chest tube removed, one remains. Stable right pneumothorax since yesterday. 2. Improved right lung volume. No new cardiopulmonary abnormality. Electronically Signed   By: Genevie Ann [name]   On: 06/03/2019 06:38   DG CHEST PORT 1 VIEW  Result Date: 06/02/2019 CLINICAL DATA:  Pneumothorax. EXAM: PORTABLE CHEST 1 VIEW COMPARISON:  June 01, 2019. FINDINGS: Stable cardiomediastinal silhouette. Stable position of 2 right-sided chest tubes. Mild right apical pneumothorax is noted  which is decreased compared to prior exam. Right internal jugular catheter is unchanged. Mild bibasilar subsegmental atelectasis is noted. Bony thorax is unremarkable. IMPRESSION: Stable position of 2 right-sided chest tubes. Mild right apical pneumothorax is noted which is decreased compared to prior exam. Electronically Signed   By: Marijo Conception [name]   On: 06/02/2019 09:51   DG CHEST PORT 1 VIEW  Result Date: 06/01/2019 CLINICAL DATA:  Pneumothorax, chest tube EXAM: PORTABLE CHEST 1 VIEW COMPARISON:  Portable exam 0633 hours compared to 05/31/2019 FINDINGS: RIGHT jugular line tip projecting over SVC. Two RIGHT thoracostomy tubes unchanged. Persistent large RIGHT pneumothorax. Postsurgical changes with atelectasis at mid inferior RIGHT lung. Mild atelectasis versus infiltrate at LEFT base slightly increased. Remaining LEFT lung clear. Bones demineralized. Atherosclerotic calcification aorta. IMPRESSION: Persistent large RIGHT pneumothorax despite thoracostomy tube with postsurgical changes at inferior RIGHT lung. Slightly increased atelectasis versus infiltrate at LEFT base. Electronically Signed   By: Lavonia Dana [name]   On: 06/01/2019 08:42   DG CHEST PORT 1 VIEW  Result Date: 05/31/2019 CLINICAL DATA:  69 year old female with right upper lobe lung nodule, status post VATS, right upper lobectomy, lymph no dissection EXAM: PORTABLE CHEST 1 VIEW COMPARISON:  05/30/2019, 05/29/2019 FINDINGS: Cardiomediastinal silhouette unchanged in size and contour with postsurgical right chest. Similar size of the residual right-sided pneumothorax with surgical changes at the apex of the lung and in the right suprahilar region. Opacity in the right hilum persists. Unchanged large bore thoracostomy tube terminating at the apex. Unchanged pigtail thoracostomy tube terminating over the hilum. Unchanged right IJ central venous catheter. Left lung with interstitial opacities and blunting of the left costophrenic angle,  unchanged. No new airspace disease. IMPRESSION: Early postsurgical changes of the right chest, with unchanged thoracostomy tubes and right pneumothorax. Unchanged right IJ central venous catheter. Electronically Signed   By: Corrie Mckusick D.O.   On: 05/31/2019 08:45   DG CHEST PORT 1  VIEW  Result Date: 05/30/2019 CLINICAL DATA:  Chest pain.  Follow-up chest tube. EXAM: PORTABLE CHEST 1 VIEW COMPARISON:  Multiple priors, including yesterday FINDINGS: Right internal jugular line tip at low SVC. 2 right chest tubes are unchanged in position. Normal heart size. Atherosclerosis in the transverse aorta. No pleural fluid. Moderate right-sided pneumothorax identified superiorly. Felt to be slightly increased compared to the prior. Clear left lung. Right perihilar airspace disease likely is due to postoperative atelectasis. IMPRESSION: Right chest tubes in place with moderate right-sided pneumothorax. Aortic Atherosclerosis (ICD10-I70.0). Electronically Signed   By: Abigail Miyamoto [name]   On: 05/30/2019 09:09   DG CHEST PORT 1 VIEW  Result Date: 05/29/2019 CLINICAL DATA:  Prior lobectomy.  Chest tube. EXAM: PORTABLE CHEST 1 VIEW COMPARISON:  05/28/2019. FINDINGS: Prior right upper lobectomy. Two right chest tubes are noted. Lucency over the right apex again noted most consistent with pneumothorax. Similar findings noted on prior exam. Right IJ line stable position. Heart size normal. Left base subsegmental atelectasis. No pleural effusion. Carotid vascular calcification. Calcifications noted over both breast. Correlation with mammography suggested. IMPRESSION: 1. Prior right upper lobectomy. Two right chest tubes are again noted. Lucency over the right apex again noted and most consistent with a pneumothorax. Similar findings noted on prior exam. Right IJ line stable position. 2.  Mild left base subsegmental atelectasis. 3. Calcifications noted over both breast. Correlation with mammography suggested. Electronically  Signed   By: Marcello Moores  Register   On: 05/29/2019 08:45   DG Chest Port 1 View  Result Date: 05/28/2019 CLINICAL DATA:  Status post lobectomy. EXAM: PORTABLE CHEST 1 VIEW COMPARISON:  May 24, 2019. FINDINGS: Stable cardiomediastinal silhouette. Atherosclerosis of thoracic aorta is noted. Left lung is clear. Postsurgical changes are noted in the right upper lobe consistent with right upper lobectomy. Two chest tubes are noted. There is lucency seen in the right upper lobe, and it is uncertain if this represents emphysematous disease or possibly pneumothorax. No pleural effusion is noted. Bony thorax is unremarkable. IMPRESSION: Postsurgical changes are noted in the right upper lobe consistent with right upper lobectomy. Two chest tubes are noted. Lucency is seen in right upper lobe, and it is uncertain if this represents emphysematous disease or possibly pneumothorax. Aortic Atherosclerosis (ICD10-I70.0). Electronically Signed   By: Marijo Conception [name]   On: 05/28/2019 14:04   DG Abd Portable 1V  Result Date: 06/09/2019 CLINICAL DATA:  Ileus. EXAM: PORTABLE ABDOMEN - 1 VIEW COMPARISON:  06/08/2019 FINDINGS: Gas is again seen in multiple loops of small and large bowel to the level of the rectum. Mildly dilated small bowel has not significantly changed. Right upper quadrant abdominal surgical clips and atherosclerotic vascular calcifications are noted. The lung bases are more fully evaluated on separate chest radiograph. IMPRESSION: Unchanged mild small bowel dilatation suggesting ileus. Electronically Signed   By: Logan Bores [name]   On: 06/09/2019 10:20   DG Abd Portable 1V  Result Date: 06/08/2019 CLINICAL DATA:  Ileus. EXAM: PORTABLE ABDOMEN - 1 VIEW COMPARISON:  06/03/2019 and chest radiograph 06/08/2019 FINDINGS: Partial visualization of the right basilar chest tube. Multiple round densities overlying the left upper abdomen are probably external to the patient. There is gas within small and large  bowel loops. Persistent dilated small bowel in the left lower abdomen measuring up to 4.1 cm, previously measured 3.2 cm. Increased bowel gas distension in the pelvis. There is gas in the rectum. Evidence for cholecystectomy clips in the right upper quadrant of  the abdomen. IMPRESSION: Slightly increased small bowel gas distension in the lower abdomen and pelvis. However, there is evidence for gas in the colon and rectum. Findings are compatible with an ileus. Well-circumscribed round densities overlying the left upper abdomen are likely external to the patient. Electronically Signed   By: Markus Daft [name]   On: 06/08/2019 09:17   DG Abd Portable 1V  Result Date: 06/02/2019 CLINICAL DATA:  Ileus. EXAM: PORTABLE ABDOMEN - 1 VIEW COMPARISON:  June 01, 2019. FINDINGS: Status post cholecystectomy. Stable mildly dilated small bowel loops are noted in the central portion of the abdomen concerning for ileus or less likely obstruction. Vascular calcifications are noted. IMPRESSION: Stable dilated small bowel loops are noted most consistent with ileus or possibly distal small bowel obstruction. Continued radiographic follow-up is recommended. Electronically Signed   By: Marijo Conception [name]   On: 06/02/2019 09:52   DG Abd Portable 1V  Result Date: 06/01/2019 CLINICAL DATA:  Nausea, vomiting, chest tube, pneumothorax EXAM: PORTABLE ABDOMEN - 1 VIEW COMPARISON:  Portable exam 0636 hours compared to 05/31/2019 FINDINGS: Persistent gaseous distension of small bowel loops in the mid abdomen. No definite bowel wall thickening. Paucity of colonic gas. Bones demineralized with mild degenerative changes of the lumbar spine. Atherosclerotic calcifications of the aorta and iliac arteries. IMPRESSION: Persistent small bowel dilatation. Electronically Signed   By: Lavonia Dana [name]   On: 06/01/2019 08:35   ECHOCARDIOGRAM COMPLETE  Result Date: 06/07/2019    ECHOCARDIOGRAM REPORT   Patient Name:   [name] Date of Exam:  06/07/2019 Medical Rec #:  [id number]       Height:       64.0 in Accession #:    [id number]      Weight:       108.0 lb Date of Birth:  [birth year]       BSA:          1.51 m Patient Age:    80 years        BP:           119/70 mmHg Patient Gender: F               HR:           71 bpm. Exam Location:  Inpatient Procedure: 2D Echo, Cardiac Doppler and Color Doppler Indications:    R00.0 Tachycardia  History:        Patient has no prior history of Echocardiogram examinations.                 Risk Factors:Hypertension, Dyslipidemia, Current Smoker and                 Sleep Apnea.  Sonographer:    Jonelle Sidle Dance Referring Phys: [id number] [place]  1. Left ventricular ejection fraction, by estimation, is 55 to 60%. The left ventricle has normal function. The left ventrical has no regional wall motion abnormalities. Left ventricular diastolic parameters are consistent with Grade I diastolic dysfunction (impaired relaxation).  2. Right ventricular systolic function is normal. The right ventricular size is normal.  3. The mitral valve is normal in structure and function. no evidence of mitral valve regurgitation. No evidence of mitral stenosis.  4. The aortic valve is normal in structure and function. Aortic valve regurgitation is not visualized. No aortic stenosis is present.  5. Aortic dilatation noted. There is moderate dilatation of the ascending aorta measuring 39 mm. FINDINGS  Left Ventricle: Left ventricular  ejection fraction, by estimation, is 55 to 60%. The left ventricle has normal function. The left ventricle has no regional wall motion abnormalities. There is no left ventricular hypertrophy. Left ventricular diastolic parameters are consistent with Grade I diastolic dysfunction (impaired relaxation). Right Ventricle: The right ventricular size is normal. No increase in right ventricular wall thickness. Right ventricular systolic function is normal. Left Atrium: Left atrial size was normal in size.  Right Atrium: Right atrial size was normal in size. Pericardium: There is no evidence of pericardial effusion. Mitral Valve: The mitral valve is normal in structure and function. No evidence of mitral valve regurgitation. No evidence of mitral valve stenosis. Tricuspid Valve: The tricuspid valve is grossly normal. Tricuspid valve regurgitation is trivial. Aortic Valve: The aortic valve is normal in structure and function. Aortic valve regurgitation is not visualized. No aortic stenosis is present. Pulmonic Valve: The pulmonic valve was grossly normal. Pulmonic valve regurgitation is trivial. Aorta: The aortic root, ascending aorta and aortic arch are all structurally normal, with no evidence of dilitation or obstruction and aortic dilatation noted. There is moderate dilatation of the ascending aorta measuring 39 mm. IAS/Shunts: The atrial septum is grossly normal.  LEFT VENTRICLE PLAX 2D LVIDd:         4.00 cm  Diastology LVIDs:         3.50 cm  LV e' lateral:   7.51 cm/s LV PW:         0.80 cm  LV E/e' lateral: 8.0 LV IVS:        0.90 cm  LV e' medial:    4.68 cm/s LVOT diam:     2.10 cm  LV E/e' medial:  12.8 LV SV:         58.88 ml LV SV Index:   12.92 LVOT Area:     3.46 cm  RIGHT VENTRICLE             IVC RV Basal diam:  2.40 cm     IVC diam: 1.10 cm RV S prime:     14.10 cm/s TAPSE (M-mode): 1.8 cm LEFT ATRIUM             Index       RIGHT ATRIUM          Index LA diam:        2.20 cm 1.46 cm/m  RA Area:     9.27 cm LA Vol (A2C):   16.2 ml 10.76 ml/m RA Volume:   16.00 ml 10.63 ml/m LA Vol (A4C):   22.8 ml 15.14 ml/m LA Biplane Vol: 19.3 ml 12.82 ml/m  AORTIC VALVE LVOT Vmax:   82.50 cm/s LVOT Vmean:  46.900 cm/s LVOT VTI:    0.170 m  AORTA Ao Root diam: 3.30 cm Ao Asc diam:  3.90 cm MITRAL VALVE MV Area (PHT): 2.69 cm             SHUNTS MV Decel Time: 282 msec             Systemic VTI:  0.17 m MV E velocity: 60.00 cm/s 103 cm/s  Systemic Diam: 2.10 cm MV A velocity: 93.40 cm/s 70.3 cm/s MV E/A ratio:   0.64       1.5 Mertie Moores MD Electronically signed by Mertie Moores MD Signature Date/Time: 06/07/2019/4:57:56 PM    Final    [place] EKG SITE RITE  Result Date: 06/08/2019 If Site Rite image not attached, placement could not be confirmed due to current cardiac rhythm.  Discharge Medications:  Allergies as of 06/13/2019       Reactions   Iodinated Diagnostic Agents Itching   itching but no hives just after iv contrast injection w/ 13 hr prep, future contrast not advised unless absolutely necessary, 13 hr prep would still be advised.//a.calhoun   Ioxaglate Itching   itching but no hives just after iv contrast injection w/ 13 hr prep, future contrast not advised unless absolutely necessary, 13 hr prep would still be advised.//a.calhoun   Omnipaque [iohexol] Anaphylaxis   Buprenorphine Hcl Nausea And Vomiting   Codeine Nausea And Vomiting   Erythromycin    Morphine And Related Nausea And Vomiting        Medication List     STOP taking these medications    telmisartan-hydrochlorothiazide 80-12.5 MG tablet Commonly known as: MICARDIS HCT   temazepam 15 MG capsule Commonly known as: RESTORIL       TAKE these medications    amLODipine 5 MG tablet Commonly known as: NORVASC Take 1 tablet (5 mg total) by mouth daily.   aspirin EC 81 MG tablet Take 81 mg by mouth daily.   cholestyramine 4 GM/DOSE powder Commonly known as: QUESTRAN Take 4 g by mouth daily.   denosumab 60 MG/ML Soln injection Commonly known as: PROLIA Inject 60 mg into the skin every 6 (six) months. Administer in upper arm, thigh, or abdomen   fentaNYL 25 MCG/HR Commonly known as: [place] 1 patch onto the skin every 3 (three) days. Start taking on: June 13, 2421   folic acid 1 MG tablet Commonly known as: FOLVITE Take 1 tablet (1 mg total) by mouth daily. Start taking on: June 14, 2019   magic mouthwash Soln Take 5 mLs by mouth 3 (three) times daily as needed for mouth pain.    metoprolol succinate 25 MG 24 hr tablet Commonly known as: TOPROL-XL Take 1 tablet (25 mg total) by mouth daily. Take with or immediately following a meal.   montelukast 10 MG tablet Commonly known as: SINGULAIR Take 10 mg by mouth at bedtime.   nicotine 21 mg/24hr patch Commonly known as: NICODERM CQ - dosed in mg/24 hours Place 1 patch (21 mg total) onto the skin daily. Start taking on: June 14, 2019   pantoprazole 40 MG tablet Commonly known as: PROTONIX Take 1 tablet (40 mg total) by mouth 2 (two) times daily.   polyethylene glycol 17 g packet Commonly known as: MIRALAX / GLYCOLAX Take 17 g by mouth daily. Start taking on: June 14, 2019   potassium chloride SA 15 MEQ tablet Commonly known as: KLOR-CON M15 Take 2 tablets (30 mEq total) by mouth daily. Start taking on: June 14, 2019   rosuvastatin 10 MG tablet Commonly known as: CRESTOR Take 1 tablet (10 mg total) by mouth daily.   sucralfate 1 GM/10ML suspension Commonly known as: CARAFATE Take 10 mLs (1 g total) by mouth 4 (four) times daily -  with meals and at bedtime.   traMADol 50 MG tablet Commonly known as: ULTRAM Take 1 tablet (50 mg total) by mouth every 8 (eight) hours as needed for moderate pain.   Ventolin HFA 108 (90 Base) MCG/ACT inhaler Generic drug: albuterol Inhale 2 puffs into the lungs every 6 (six) hours as needed for wheezing or shortness of breath.   Vitamin D (Ergocalciferol) 1.25 MG (50000 UNIT) Caps capsule Commonly known as: DRISDOL Take 50,000 Units by mouth once a week.  Durable Medical Equipment  (From admission, onward)           Start     Ordered   06/13/19 1427  For home use only DME Walker rolling  Once    Question Answer Comment  Walker: With 5 Inch Wheels   Patient needs a walker to treat with the following condition Weakness      06/13/19 1426   06/13/19 1206  For home use only DME oxygen  Once    Question Answer Comment  Length of  Need 6 Months   Mode or (Route) Nasal cannula   Liters per Minute 3   Frequency Continuous (stationary and portable oxygen unit needed)   Oxygen conserving device No   Oxygen delivery system Gas      06/13/19 1205            Follow Up Appointments: Follow-up Information     Melrose Nakayama, MD. Go on 06/26/2019.   Specialty: Cardiothoracic Surgery Why: PA/LAT CXR to be taken (at [facility] which is in the same building as Dr. Leonarda Salon office) on 03/02 at 9:30 am;Appointment time is at 10:00 am Contact information: [place] [address] [phone]         Jani Gravel, MD Follow up.   Specialty: Internal Medicine Why: Please contact for hospital follow up for 1-2 weeks with repeat lab work Contact information: [place] [address] [phone]         Pixie Casino, MD .   Specialty: Cardiology Contact information: [place] [address] [phone]         [facility] Follow up.   Why: home 02 arranged- they will deliver portable concentrator to room prior to discharge to use for transport- (they will make contact with daughter for delivery of home concentrator to daughter's home in [place])- [facility] also arranged- to be delivered to room Contact information: [place] [address] [phone]         [facility] Follow up.   Specialty: [place] Why: HHRN/PT- arranged- they will contact you to start services while in [place] and then can transfer services when you return home to [place]. Contact information: [place] [address] [phone]            Signed:   Ellamae Sia 06/13/2019, 3:33 PM

## 2019-05-29 NOTE — Progress Notes (Addendum)
      [address]       ,[place] [address]             [phone]       1 Day Post-Op Procedure(s) (LRB): XI ROBOTIC ASSISTED THORASCOPY-WEDGE RESECTION AND  RIGHT  UPPER LOBECTOMY (Right) Intercostal Nerve Block (Right) Node Dissection  Subjective: Patient states she bit her tongue yesterday and her tongue was numb. She is about to eat breakfast and states her tongue is "better".  Objective: Vital signs in last 24 hours: Temp:  [97 F (36.1 C)-97.8 F (36.6 C)] 97.8 F (36.6 C) (02/02 0403) Pulse Rate:  [56-77] 74 (02/02 0403) Cardiac Rhythm: Normal sinus rhythm (02/02 0413) Resp:  [12-26] 22 (02/02 0413) BP: (85-113)/(49-78) 96/53 (02/02 0403) SpO2:  [92 %-100 %] 96 % (02/02 0433) Arterial Line BP: (87-118)/(45-66) 118/66 (02/02 0015) FiO2 (%):  [0 %] 0 % (02/01 1640)     Intake/Output from previous day: 02/01 0701 - 02/02 0700 In: 4170 [P.O.:170; I.V.:3500; IV Piggyback:500] Out: 1155 [Urine:105; Blood:250; Chest Tube:800]   Physical Exam:  Cardiovascular: RRR Pulmonary: Clear on the left and slightly diminished on the right Abdomen: Soft, non tender, bowel sounds present. Extremities: Mild bilateral lower extremity edema. Wounds: Sero sanguinous drainage from chest tube. Chest Tubes: to suction, no air leak  Lab Results: CBC: Recent Labs    05/28/19 1115 05/29/19 0423  WBC  --  8.6  HGB 13.3 10.3*  HCT 39.0 30.4*  PLT  --  161   BMET:  Recent Labs    05/28/19 0609 05/28/19 0609 05/28/19 1115 05/29/19 0423  NA 131*   < > 132* 134*  K 3.2*   < > 3.3* 3.3*  CL 92*  --   --  102  CO2  --   --   --  23  GLUCOSE 96  --   --  138*  BUN 12  --   --  7*  CREATININE 0.50  --   --  0.54  CALCIUM  --   --   --  7.9*   < > = values in this interval not displayed.    PT/INR: No results for input(s): LABPROT, INR in the last 72 hours. ABG:  INR: Will add last result for INR, ABG once components are confirmed Will add last 4 CBG  results once components are confirmed  Assessment/Plan:  1. CV - SR with HR in the 70's.  2.  Pulmonary - ABG results noted. On 3 liters of oxygen via [place]. Will wean over next few days. Chest tubes with 800 cc since surgery. Chest tube is to suction and there is no air leak. CXR this am appears stable. Encourage incentive spirometer. Await final pathology.  3. Supplement potassium 4. ABL anemia-H and H this am 10.3 and 30.4 5. Remove a line, foley, decrease IVF  Donielle M ZimmermanPA-C 05/29/2019,7:31 AM [phone]  Patient seen and examined, agree with above 100 % sat and breathing comfortably on 3L She did have an air leak when I examined her- will try CT to water seal  Remo Lipps C. Roxan Hockey, MD [facility] [phone]

## 2019-05-29 NOTE — Anesthesia Postprocedure Evaluation (Signed)
Anesthesia Post Note  Patient: MARGUARITE MARKOV  Procedure(s) Performed: XI ROBOTIC ASSISTED THORASCOPY-WEDGE RESECTION AND  RIGHT  UPPER LOBECTOMY (Right Chest) Intercostal Nerve Block (Right Chest) Node Dissection (Chest)     Patient location during evaluation: PACU Anesthesia Type: General Level of consciousness: awake and alert Pain management: pain level controlled Vital Signs Assessment: post-procedure vital signs reviewed and stable Respiratory status: spontaneous breathing, nonlabored ventilation, respiratory function stable and patient connected to nasal cannula oxygen Cardiovascular status: blood pressure returned to baseline and stable Postop Assessment: no apparent nausea or vomiting Anesthetic complications: no    Last Vitals:  Vitals:   05/29/19 1200 05/29/19 1327  BP:    Pulse:    Resp: 16 (!) 26  Temp:    SpO2: 93% (!) 0%    Last Pain:  Vitals:   05/29/19 1327  TempSrc:   PainSc: 2                  Chella Chapdelaine COKER

## 2019-05-29 NOTE — Progress Notes (Signed)
Fentanyl PCA  21 ml wasted in the stericycle bin witnessed by [employer].

## 2019-05-29 NOTE — Discharge Instructions (Signed)
ACTIVITY:  1.Increase activity slowly. 2.Walk daily and increase frequency and duration as tolerates. 3.May walk up steps. 4.No lifting more than ten pounds for two weeks. 5.No driving for at least two weeks and must not be taking any narcotic for pain 6.Avoid straining. 7.STOP any activity that causes chest pain, shortness of breath, dizziness,sweating,     or excessive weakness. 8.Continue with breathing exercises daily.  DIET:  Heart healthy   WOUNDS:  1.May shower. 2.Clean wounds with mild soap and water.  Call the office at [phone] if any problems arise.Discharge Instructions:

## 2019-05-29 NOTE — Progress Notes (Signed)
Complained of indigestion. PA made aware with order. Maalox 30 ml given with relief after few min. Continue to monitor.

## 2019-05-29 NOTE — Op Note (Signed)
NAMEFIORELLA, Reyes MEDICAL RECORD AY:[id number] ACCOUNT [id number] DATE OF BIRTH:[birth year] FACILITY: [facility] LOCATION: [facility] PHYSICIAN:Errol Ala Chaya Jan, MD  OPERATIVE REPORT  DATE OF PROCEDURE:  05/28/2019  PREOPERATIVE DIAGNOSIS:  Right upper lobe lung nodule.  POSTOPERATIVE DIAGNOSIS:  Nonsmall cell carcinoma, right upper lobe, clinical stage IA (T1, N0).  PROCEDURE:   Xi robotic video-assisted wedge resection and right upper lobectomy Lymph node dissection Intercostal nerve blocks.  SURGEON:  Modesto Charon, MD  ASSISTANT:  Melodie Bouillon, MD   SECOND ASSISTANT:  Jadene Pierini, PA  ANESTHESIA:  General.  FINDINGS:  Nodule in the posterolateral aspect of right upper lobe.  Frozen section revealed nonsmall cell carcinoma, significant emphysematous change. No air leak from the bronchial stump. Minimal air leak from staple lines.  CLINICAL NOTE:  [name] is a 69 year old woman with a history of tobacco abuse, who has had multiple lung nodules.  Recently a nodule in the posterolateral right upper lobe had increased in size.  On PET, the nodule was mildly hypermetabolic.  She  was advised to undergo surgical resection.  She did well with a 6-minute walk test and had adequate pulmonary reserve to tolerate a lobectomy.  The plan was for a wedge resection and if the frozen section was positive, we would proceed with a right upper  lobectomy.  The indications, risks, benefits, and alternatives were discussed in detail with the patient.  She understood and accepted the risks and agreed to proceed.  PROCEDURE IN DETAIL:  [name] was brought to the preoperative holding area on 05/28/2019.  Anesthesia placed a central venous catheter and an arterial blood pressure monitoring line.  She was taken to the operating room, anesthetized and intubated with a double  lumen endotracheal tube.  Intravenous antibiotics were administered.  A Foley catheter was placed.  Sequential  compression devices were placed on the calves for DVT prophylaxis.  She was placed in a left lateral decubitus position and the right chest  was prepped and draped in the usual sterile fashion.  Single lung ventilation of the left lung was initiated and was tolerated well throughout the procedure.  A timeout was performed.  A solution containing 20 mL of liposomal bupivacaine and 30 mL of 0.5% bupivacaine and 50 mL of saline was prepared.  This was used for the intercostal nerve blocks as well as local at the skin incisions.  An incision was made  in the 8th interspace in the mid axillary line.  An 8 mm robotic port was placed.  The thoracoscope was advanced into the chest.  There was good isolation of the lung.  Carbon dioxide was insufflated with a pressure of 10 mmHg.  Three additional ports  were placed in the 8th interspace.  A 12 mm port was placed anteriorly.  Another 12 mm port was placed 4 cm posterior to the 1st incision and then finally an 8 mm port was placed posteriorly.  A 12 mm assistant's port was placed in the 10th interspace  centered between the 2 anterior robotic ports.  The robot then was deployed.  The camera was attached to the robot.  Targeting was performed.  The remaining arms were attached to the respective ports and the instruments were inserted with the retraction  instrument in the posterior port and working instruments on either side of the camera.  The instruments were advanced with thoracoscopic visualization.  I then broke scrub and went to the console.  Dr. Kipp Brood and Jadene Pierini remained at the bedside.  The upper lobe was inspected.  The site of the lesion was easily visible.  A wedge resection was performed using the robotic stapler.  Blue and green cartridges were used.  The stapler was a 45 mm stapler and 4 firings were required.  The specimen  was placed into an endoscopic retrieval bag, removed and sent for frozen section.  While awaiting those results the  inferior ligament was divided and lymph node dissection was begun.  Level 9 nodes were removed.  All the lymph nodes that were removed  were sent as separate specimens for permanent pathology.  The pleural reflection was divided at the hilum posteriorly, superiorly between the azygos vein and the hilum and then anteriorly, taking care to not use cautery in the vicinity of the phrenic  nerve.  By this point, the frozen section returned showing adenocarcinoma.  Decision was made to proceed with lobectomy as discussed with the patient preoperatively.  The middle lobe branch of the superior pulmonary vein was identified.  The upper lobe branches were dissected out, encircled and divided with the robotic stapler using a white cartridge.  Next, the anterior and apical pulmonary artery branches were dissected out.  They arose as a common trunk.  These were likewise encircled and  divided with the vascular stapler.  The fissure was incomplete.  Dissection was begun relatively anteriorly and the pulmonary vein was identified in the fissure between the middle and lower lobes.  The fissure then was completed with sequential firings of the robotic stapler from an  anterior to posterior approach.  First, the basilar and then the superior segmental branch of the lower lobe were identified and then the 2 posterior ascending branches that arose in close proximity to each other were identified as well.  This required  multiple firings of the robotic stapler to divide the parenchyma.  Once this was done and dissection was taken to the area of the posterior ascending branches, when doing so, some bleeding was encountered.  Direct pressure was held.  It was determined that the bleeding was  coming from a small branch in the parenchyma not the primary arterial branches.  This was cauterized with the bipolar vessel sealer with good hemostasis.  The 2 posterior ascending branches, one of which was normal size and the other was  very small were  then dissected out, encircled and divided with the vascular stapler together.  The upper lobe bronchus then was freed of surrounding tissue.  A stapler was placed across the upper lobe bronchus and closed.  Test inflation showed good aeration of the  lower and middle lobes.  The stapler then was fired, transecting the right upper lobe bronchus.  The minor fissure then was divided working from posterior to anterior with multiple additional firings of the robotic stapler using both blue and green  cartridges.  The specimen was placed into an endoscopic retrieval bag and placed in the lower portion of the chest.  The chest was copiously irrigated with warm saline.  A test inflation at 30 cm of water revealed no leakage from the bronchial stump, but there was some leakage around the staple lines.   The robot was undocked and pulled away from the operative field.  The specimen then was removed through the 10th interspace assistance port.  The port did have to be widened to allow the specimen to be removed.  It was sent for frozen section of the  bronchial margin.  The chest was copiously irrigated with warm  saline.  The thoracoscope was used to inspect all of the staple lines as well as the port sites to ensure there was no abnormal bleeding.  All sponges were removed and the counts were  correct.  Intercostal nerve blocks then were performed from the 3rd to the 10th interspace.  10 mL of the bupivacaine solution was injected into a subpleural plane at each level.  The remainder of the bupivacaine solution was used for local at the skin  incisions.  The assistant's port was closed with #1 Vicryl fascial suture followed by 2-0 Vicryl subcutaneous suture and 3-0 Vicryl subcuticular suture.  Dual-lung ventilation was resumed.  The remaining port incisions were closed in standard  fashion.  A 28-French chest tube was left through the anterior port incision and secured with #2 silk suture.  Prior to  resuming dual lung ventilation a 2nd 14-French pigtail catheter was placed percutaneously superiorly and directed to the apex in case  there was a prolonged air leak.  All sponge, needle and instrument counts were correct at the end of the procedure.  The patient was placed back in the supine position.  The chest tube was placed to suction.  She was extubated in the operating room and  taken to the [facility] in good condition.  CN/NUANCE  D:05/28/2019 T:05/29/2019 JOB:[id number]

## 2019-05-29 NOTE — Plan of Care (Signed)

## 2019-05-30 ENCOUNTER — Inpatient Hospital Stay (HOSPITAL_COMMUNITY): Payer: Medicare PPO

## 2019-05-30 LAB — COMPREHENSIVE METABOLIC PANEL
ALT: 14 U/L (ref 0–44)
AST: 21 U/L (ref 15–41)
Albumin: 3.3 g/dL — ABNORMAL LOW (ref 3.5–5.0)
Alkaline Phosphatase: 40 U/L (ref 38–126)
Anion gap: 11 (ref 5–15)
BUN: 6 mg/dL — ABNORMAL LOW (ref 8–23)
CO2: 31 mmol/L (ref 22–32)
Calcium: 8.9 mg/dL (ref 8.9–10.3)
Chloride: 94 mmol/L — ABNORMAL LOW (ref 98–111)
Creatinine, Ser: 0.5 mg/dL (ref 0.44–1.00)
GFR calc Af Amer: >60 mL/min (ref >60)
GFR calc non Af Amer: >60 mL/min (ref >60)
Glucose, Bld: 128 mg/dL — ABNORMAL HIGH (ref 70–99)
Potassium: 3.8 mmol/L (ref 3.5–5.1)
Sodium: 136 mmol/L (ref 135–145)
Total Bilirubin: 0.8 mg/dL (ref 0.3–1.2)
Total Protein: 5.7 g/dL — ABNORMAL LOW (ref 6.5–8.1)

## 2019-05-30 LAB — CBC
HCT: 38.6 % (ref 36.0–46.0)
Hemoglobin: 13 g/dL (ref 12.0–15.0)
MCH: 34 pg (ref 26.0–34.0)
MCHC: 33.7 g/dL (ref 30.0–36.0)
MCV: 101 fL — ABNORMAL HIGH (ref 80.0–100.0)
Platelets: 220 10*3/uL (ref 150–400)
RBC: 3.82 MIL/uL — ABNORMAL LOW (ref 3.87–5.11)
RDW: 12.8 % (ref 11.5–15.5)
WBC: 13.5 10*3/uL — ABNORMAL HIGH (ref 4.0–10.5)
nRBC: 0 % (ref 0.0–0.2)

## 2019-05-30 MED ORDER — TRAMADOL HCL 50 MG PO TABS: 50.0000 mg | ORAL_TABLET | Freq: Four times a day (QID) | ORAL | Status: DC | PRN

## 2019-05-30 MED ORDER — METOCLOPRAMIDE HCL 5 MG/ML IJ SOLN
10.0000 mg | Freq: Four times a day (QID) | INTRAMUSCULAR | Status: AC
  Administered 2019-05-30 – 2019-05-31 (×4): 10 mg via INTRAVENOUS
  Filled 2019-05-30 (×4): qty 2

## 2019-05-30 MED ORDER — FENTANYL CITRATE (PF) 100 MCG/2ML IJ SOLN
12.5000 ug | INTRAMUSCULAR | Status: DC | PRN
  Administered 2019-05-31 – 2019-06-04 (×13): 12.5 ug via INTRAVENOUS
  Filled 2019-05-30 (×13): qty 2

## 2019-05-30 MED ORDER — TRAMADOL HCL 50 MG PO TABS
50.0000 mg | ORAL_TABLET | Freq: Four times a day (QID) | ORAL | Status: DC | PRN
  Administered 2019-05-30 – 2019-06-04 (×6): 50 mg via ORAL
  Filled 2019-05-30 (×6): qty 1

## 2019-05-30 MED ORDER — FLUCONAZOLE 100 MG PO TABS
100.0000 mg | ORAL_TABLET | Freq: Every day | ORAL | Status: AC
  Administered 2019-05-30 – 2019-06-03 (×5): 100 mg via ORAL
  Filled 2019-05-30 (×6): qty 1

## 2019-05-30 MED ORDER — KETOROLAC TROMETHAMINE 30 MG/ML IJ SOLN
15.0000 mg | Freq: Four times a day (QID) | INTRAMUSCULAR | Status: AC
  Administered 2019-05-30 – 2019-06-02 (×10): 15 mg via INTRAVENOUS
  Filled 2019-05-30 (×10): qty 1

## 2019-05-30 MED ORDER — GUAIFENESIN ER 600 MG PO TB12: 600.0000 mg | ORAL_TABLET | Freq: Two times a day (BID) | ORAL | Status: DC | PRN

## 2019-05-30 MED ORDER — POTASSIUM CHLORIDE CRYS ER 20 MEQ PO TBCR
30.0000 meq | EXTENDED_RELEASE_TABLET | Freq: Once | ORAL | Status: AC
  Administered 2019-05-30: 30 meq via ORAL
  Filled 2019-05-30: qty 1

## 2019-05-30 NOTE — Addendum Note (Signed)
Addendum  created 05/30/19 1309 by Murvin Natal, MD   Clinical Note Signed

## 2019-05-30 NOTE — Progress Notes (Addendum)
      [address]       [place] [address]             [phone]       2 Days Post-Op Procedure(s) (LRB): XI ROBOTIC ASSISTED THORASCOPY-WEDGE RESECTION AND  RIGHT  UPPER LOBECTOMY (Right) Intercostal Nerve Block (Right) Node Dissection  Subjective: Patient states "I feel like I have an air bubble in my esophagus". Swallowing is sometimes difficult and she also has nausea vomited earlier this am. She also had diarrhea yesterday.  Objective: Vital signs in last 24 hours: Temp:  [97.4 F (36.3 C)-98.4 F (36.9 C)] 98.4 F (36.9 C) (02/03 0700) Pulse Rate:  [69-87] 83 (02/03 0413) Cardiac Rhythm: Normal sinus rhythm (02/02 1900) Resp:  [15-27] 19 (02/03 0700) BP: (92-119)/(61-85) 100/75 (02/03 0413) SpO2:  [0 %-99 %] 98 % (02/03 0413) FiO2 (%):  [0 %] 0 % (02/02 1327)     Intake/Output from previous day: 02/02 0701 - 02/03 0700 In: 1127.6 [P.O.:510; I.V.:517.6; IV Piggyback:100] Out: 910 [Urine:750; Chest Tube:160]   Physical Exam:  Cardiovascular: RRR Pulmonary: Clear on the left and slightly diminished on the right Abdomen: Soft, non tender, bowel sounds present. Extremities: No lower extremity edema. Wounds: Sero sanguinous drainage from chest tube. Chest Tubes: to water seal, small air leak with cough  Lab Results: CBC: Recent Labs    05/29/19 0423 05/30/19 0414  WBC 8.6 13.5*  HGB 10.3* 13.0  HCT 30.4* 38.6  PLT 161 220   BMET:  Recent Labs    05/29/19 0423 05/30/19 0414  NA 134* 136  K 3.3* 3.8  CL 102 94*  CO2 23 31  GLUCOSE 138* 128*  BUN 7* 6*  CREATININE 0.54 0.50  CALCIUM 7.9* 8.9    PT/INR: No results for input(s): LABPROT, INR in the last 72 hours. ABG:  INR: Will add last result for INR, ABG once components are confirmed Will add last 4 CBG results once components are confirmed  Assessment/Plan:  1. CV - SR with HR in the 70's. On Amlodipine 5 mg daily, HCTZ 12.5 mg daily, and Irbesartan 300 mg daily. Will  hold Avapro for now. 2.  Pulmonary - On 3 liters of oxygen via [place]. Will wean over next few days. Chest tubes with 160 cc last 24 hours. Chest tube is to water seal and there is a small air leak. CXR this am appears stable. Chest tube to continue to water seal for now. Encourage incentive spirometer. Final pathology: Adenocarcinoma, TNM Code:pT1b, pN0;Dr. Roxan Hockey to discuss with paitent 3. Supplement potassium 4. GI-She had indigestion yesterday and Maalox ordered. Phenergan on order PRN nausea. On Fentanyl PCA but no oral narcotic. Will order Ultram PRN as with allergy to Morphine and Codeine. Will give a few doses of Reglan. She bit her tongue previously. Will ask speech to evaluate as has some difficulty swallowing. Stop stool softeners   Melanie Vanyo M ZimmermanPA-C 05/30/2019,7:33 AM [phone]

## 2019-05-30 NOTE — Progress Notes (Signed)
Notified by CTS of patient with post operative tongue issue. Patient seen and examined in 2C13. Well healing ulcers visualized on the dorsal and ventral sides of the tongue. Tongue appears to be slightly engorged. Patient also reports numbness on the tip of her tongue. These post-operative issues possibly resulted from the presence of the double lumen tube for six hours during surgery. Recommend oral care and potential outpatient ENT follow-up, if anterior tongue numbness presents.

## 2019-05-31 ENCOUNTER — Inpatient Hospital Stay (HOSPITAL_COMMUNITY): Payer: Medicare PPO

## 2019-05-31 MED ORDER — METOCLOPRAMIDE HCL 5 MG/ML IJ SOLN
10.0000 mg | Freq: Four times a day (QID) | INTRAMUSCULAR | Status: AC
  Administered 2019-05-31 (×4): 10 mg via INTRAVENOUS
  Filled 2019-05-31 (×4): qty 2

## 2019-05-31 MED ORDER — MAGIC MOUTHWASH
5.0000 mL | Freq: Three times a day (TID) | ORAL | Status: DC | PRN
  Administered 2019-06-01 – 2019-06-13 (×21): 5 mL via ORAL
  Filled 2019-05-31 (×25): qty 5

## 2019-05-31 NOTE — Plan of Care (Signed)

## 2019-05-31 NOTE — Progress Notes (Addendum)
      [address]       [place] [address]             [phone]       3 Days Post-Op Procedure(s) (LRB): XI ROBOTIC ASSISTED THORASCOPY-WEDGE RESECTION AND  RIGHT  UPPER LOBECTOMY (Right) Intercostal Nerve Block (Right) Node Dissection  Subjective: Patient states  "I have a hard time swallowing and it burns a lot when I do" . She denies nausea or vomiting this am. She has already walked the hallway once. She still has some tongue numbness.  Objective: Vital signs in last 24 hours: Temp:  [97.4 F (36.3 C)-98 F (36.7 C)] 97.9 F (36.6 C) (02/04 0722) Pulse Rate:  [68-82] 82 (02/04 0733) Cardiac Rhythm: Normal sinus rhythm (02/04 0700) Resp:  [15-30] 22 (02/04 0733) BP: (102-125)/(74-81) 113/78 (02/04 0722) SpO2:  [92 %-99 %] 94 % (02/04 0733)     Intake/Output from previous day: 02/03 0701 - 02/04 0700 In: 252.9 [I.V.:252.9] Out: 40 [Chest Tube:40]   Physical Exam:  Cardiovascular: RRR Pulmonary: Clear on the left and  diminished on the right Abdomen: Soft, distended, some diffuse tenderness with palpation, bowel sounds present. Extremities: No lower extremity edema. Wounds: Sero sanguinous drainage from chest tube. Chest Tubes: to water seal, small air leak that worsens with cough  Lab Results: CBC: Recent Labs    05/29/19 0423 05/30/19 0414  WBC 8.6 13.5*  HGB 10.3* 13.0  HCT 30.4* 38.6  PLT 161 220   BMET:  Recent Labs    05/29/19 0423 05/30/19 0414  NA 134* 136  K 3.3* 3.8  CL 102 94*  CO2 23 31  GLUCOSE 138* 128*  BUN 7* 6*  CREATININE 0.54 0.50  CALCIUM 7.9* 8.9    PT/INR: No results for input(s): LABPROT, INR in the last 72 hours. ABG:  INR: Will add last result for INR, ABG once components are confirmed Will add last 4 CBG results once components are confirmed  Assessment/Plan:  1. CV - SR with HR in the 70's. On Amlodipine 5 mg daily, HCTZ 12.5 mg daily, and Irbesartan 300 mg daily. Will hold Avapro for  now. 2.  Pulmonary - On 2-3 liters of oxygen via [place]. Will wean over next few days. Chest tubes with 40 cc last 24 hours. Chest tube is to water seal and there is a small air leak. CXR this am appears stable (right pneumothorax) . Chest tube to continue to water seal for now. Encourage incentive spirometer.  3. GI- I ordered speech to evaluate yesterday  (as has some difficulty swallowing) but not done. Will contact again today.  Will check a KUB as distended this am and she elicits diffuse pain when palpated 4. Regarding tongue numbness, anesthesia evaluated yesterday. Recommends ENT evaluation if does not improve  Donielle M ZimmermanPA-C 05/31/2019,7:39 AM [phone]  Patient seen and examined, agree with above Will add magic mouthwash, continue Diflucan for probable thrush Still with an air leak. CXR shows an apical space Will leave CT in today, consider removing and using pigtail starting tomorrow  Remo Lipps C. Roxan Hockey, MD [facility] [phone]

## 2019-05-31 NOTE — Plan of Care (Signed)
  Problem: Activity: Goal: Risk for activity intolerance will decrease Outcome: Progressing   

## 2019-05-31 NOTE — Progress Notes (Signed)
Patient tolerated ambulating 463ft with no shortness of breath on 2L of oxygen

## 2019-05-31 NOTE — Evaluation (Signed)
Clinical/Bedside Swallow Evaluation Patient Details  Name: [name] MRN: [id number] Date of Birth: 04-07-1951  Today's Date: 05/31/2019 Time: SLP Start Time (ACUTE ONLY): 1150 SLP Stop Time (ACUTE ONLY): 1206 SLP Time Calculation (min) (ACUTE ONLY): 16 min  Past Medical History:  Past Medical History:  Diagnosis Date  . Active smoker   . Anxiety   . Cervical dysplasia   . Complication of anesthesia    "I'm usually hard to wake up"  . Dyslipidemia   . Endometriosis   . History of nuclear stress test 07/07/2010   dipyridamole; low risk, post-stress EF 65%  . Hypertension   . Migraine   . Osteopenia    08/2006 and 08/2008 Dexa Scans w [facility]   . Pneumonia    hx pneunomia ~2016  . PONV (postoperative nausea and vomiting)   . Sleep apnea    does not wear her CPAP because "it gave me respiratory issues (bronchities and pneumonia)"   Past Surgical History:  Past Surgical History:  Procedure Laterality Date  . BACK SURGERY  2002   RUPTURED DISC   . CARDIAC CATHETERIZATION  11/20/2002   patent coronary arteries   . CHOLECYSTECTOMY  2006  . COLPOSCOPY    . GYNECOLOGIC CRYOSURGERY    . INTERCOSTAL NERVE BLOCK Right 05/28/2019   Procedure: Intercostal Nerve Block;  Surgeon: Melrose Nakayama, MD;  Location: [facility];  Service: Thoracic;  Laterality: Right;  . NODE DISSECTION  05/28/2019   Procedure: Node Dissection;  Surgeon: Melrose Nakayama, MD;  Location: [facility] OR;  Service: Thoracic;;  . NOSE SURGERY     X2  . OOPHORECTOMY     BSO  . SHOULDER SURGERY     ROTATOR CUFF X 2  . THORACOSCOPY  05/28/2019   THORASCOPY-WEDGE RESECTION AND  RIGHT  UPPER LOBECTOMY (Right)  . THROAT SURGERY     REMOVAL OF TUMOR  . TONSILLECTOMY    . VAGINAL HYSTERECTOMY  2000   WITH BSO   HPI:  69 year old woman with a history of tobacco abuse, COPD, anxiety, hypertension, dyslipidemia, endometriosis, and migraines. 1.3 cm mixed density nodule in the right upper lobe on a low-dose CT for  lung cancer screening.  That nodule is hypermetabolic on PET/CT with an SUV of 2.4.  These findings are highly suspicious for a stage Ia (T1, N0) non-small cell lung cancer. Right upper lobectomy 05/28/19. Pt report tongue is numb, and feels like she has an air buggle in her esophagus   Assessment / Plan / Recommendation Clinical Impression  Pt presents with adequate oral motor strength and function, aside from new onset lingual numbness pt reports having since surgery. She also has a sore on the tip of her tongue. Recommend consideration of magic mouthwash for this. Pt was encouraged to be careful not to bite her tongue.   Limited PO trials given, due to pt report of vomiting after taking Maalox. No overt s/s aspiration observed or reported. Suspect pt primary issue is esophageal in nature. Recommend consideration of a regular barium swallow (DG Esophagus) to evaluate esophageal motility. Pt was provided with written information on behavioral and dietary strategies for managment of esophageal dysmotility. Will follow up for education and to answer questions she may have.    SLP Visit Diagnosis: Dysphagia, unspecified (R13.10)    Aspiration Risk  Mild aspiration risk    Diet Recommendation Thin liquid   Liquid Administration via: Cup;Straw Medication Administration: Other (Comment)(as tolerated) Supervision: Patient able to self feed Compensations: Slow  rate;Small sips/bites Postural Changes: Seated upright at 90 degrees;Remain upright for at least 30 minutes after po intake    Other  Recommendations Recommended Consults: Consider esophageal assessment Oral Care Recommendations: Oral care QID   Follow up Recommendations None      Frequency and Duration min 1 x/week  1 week       Prognosis Prognosis for Safe Diet Advancement: Good      Swallow Study   General Date of Onset: 05/28/19 HPI: 69 year old woman with a history of tobacco abuse, COPD, anxiety, hypertension, dyslipidemia,  endometriosis, and migraines. 1.3 cm mixed density nodule in the right upper lobe on a low-dose CT for lung cancer screening.  That nodule is hypermetabolic on PET/CT with an SUV of 2.4.  These findings are highly suspicious for a stage Ia (T1, N0) non-small cell lung cancer. Right upper lobectomy 05/28/19. Pt report tongue is numb, and feels like she has an air buggle in her esophagus Type of Study: Bedside Swallow Evaluation Previous Swallow Assessment: none Diet Prior to this Study: Regular;Thin liquids Temperature Spikes Noted: No Respiratory Status: Nasal cannula Behavior/Cognition: Alert;Cooperative Oral Cavity Assessment: (sore on tongue, numbness) Oral Care Completed by SLP: No Oral Cavity - Dentition: Adequate natural dentition Self-Feeding Abilities: Able to feed self Patient Positioning: Upright in bed Baseline Vocal Quality: Normal Volitional Cough: Strong Volitional Swallow: Able to elicit    Oral/Motor/Sensory Function Overall Oral Motor/Sensory Function: Mild impairment Lingual ROM: Within Functional Limits Lingual Symmetry: Within Functional Limits Lingual Strength: Within Functional Limits Lingual Sensation: Other (Comment)(pt reports numbness since surgery)   Ice Chips Ice chips: Not tested   Thin Liquid Thin Liquid: Within functional limits Presentation: Cup    Nectar Thick Nectar Thick Liquid: Not tested   Honey Thick Honey Thick Liquid: Not tested   Puree Puree: Not tested   Solid     Solid: Not tested     Celia B. Quentin Ore, [facility], [place] Speech Language Pathologist Office: [phone] Pager: [phone]  Shonna Chock 05/31/2019,12:20 PM

## 2019-06-01 ENCOUNTER — Inpatient Hospital Stay (HOSPITAL_COMMUNITY): Payer: Medicare PPO

## 2019-06-01 LAB — CBC
HCT: 38 % (ref 36.0–46.0)
Hemoglobin: 12.6 g/dL (ref 12.0–15.0)
MCH: 33.7 pg (ref 26.0–34.0)
MCHC: 33.2 g/dL (ref 30.0–36.0)
MCV: 101.6 fL — ABNORMAL HIGH (ref 80.0–100.0)
Platelets: 247 10*3/uL (ref 150–400)
RBC: 3.74 MIL/uL — ABNORMAL LOW (ref 3.87–5.11)
RDW: 12.8 % (ref 11.5–15.5)
WBC: 5.5 10*3/uL (ref 4.0–10.5)
nRBC: 0 % (ref 0.0–0.2)

## 2019-06-01 LAB — BASIC METABOLIC PANEL
Anion gap: 12 (ref 5–15)
BUN: 28 mg/dL — ABNORMAL HIGH (ref 8–23)
CO2: 29 mmol/L (ref 22–32)
Calcium: 8.7 mg/dL — ABNORMAL LOW (ref 8.9–10.3)
Chloride: 94 mmol/L — ABNORMAL LOW (ref 98–111)
Creatinine, Ser: 0.7 mg/dL (ref 0.44–1.00)
GFR calc Af Amer: >60 mL/min (ref >60)
GFR calc non Af Amer: >60 mL/min (ref >60)
Glucose, Bld: 116 mg/dL — ABNORMAL HIGH (ref 70–99)
Potassium: 4.2 mmol/L (ref 3.5–5.1)
Sodium: 135 mmol/L (ref 135–145)

## 2019-06-01 LAB — AMYLASE: Amylase: 16 U/L — ABNORMAL LOW (ref 28–100)

## 2019-06-01 MED ORDER — ALPRAZOLAM 0.5 MG PO TABS
0.5000 mg | ORAL_TABLET | Freq: Three times a day (TID) | ORAL | Status: DC | PRN
  Administered 2019-06-01 – 2019-06-05 (×4): 0.5 mg via ORAL
  Filled 2019-06-01 (×5): qty 1

## 2019-06-01 MED ORDER — LORAZEPAM 2 MG/ML IJ SOLN
1.0000 mg | Freq: Once | INTRAMUSCULAR | Status: AC
  Administered 2019-06-01: 0.5 mg via INTRAVENOUS
  Filled 2019-06-01: qty 1

## 2019-06-01 MED ORDER — METOPROLOL SUCCINATE ER 25 MG PO TB24: 25.0000 mg | ORAL_TABLET | Freq: Every day | ORAL | Status: DC

## 2019-06-01 MED ORDER — DEXAMETHASONE SODIUM PHOSPHATE 4 MG/ML IJ SOLN
4.0000 mg | Freq: Once | INTRAMUSCULAR | Status: AC
  Administered 2019-06-01: 4 mg via INTRAVENOUS
  Filled 2019-06-01: qty 1

## 2019-06-01 MED ORDER — METOPROLOL SUCCINATE ER 25 MG PO TB24
12.5000 mg | ORAL_TABLET | Freq: Every day | ORAL | Status: DC
  Administered 2019-06-01 – 2019-06-07 (×7): 12.5 mg via ORAL
  Filled 2019-06-01 (×7): qty 1

## 2019-06-01 NOTE — Progress Notes (Addendum)
[address]       [place] [address]             [phone]       4 Days Post-Op Procedure(s) (LRB): XI ROBOTIC ASSISTED THORASCOPY-WEDGE RESECTION AND  RIGHT  UPPER LOBECTOMY (Right) Intercostal Nerve Block (Right) Node Dissection  Subjective: She is receiving a breathing treatment this am. Patient states she has nausea and has thrown up two times this am. She also has complaints of diffuse belly pain.  Objective: Vital signs in last 24 hours: Temp:  [97.6 F (36.4 C)-98.3 F (36.8 C)] 98.2 F (36.8 C) (02/05 0324) Pulse Rate:  [76-99] 98 (02/05 0400) Cardiac Rhythm: Supraventricular tachycardia;Normal sinus rhythm (02/05 0526) Resp:  [17-29] 20 (02/05 0400) BP: (113-128)/(78-94) 117/87 (02/05 0324) SpO2:  [92 %-98 %] 96 % (02/05 0400)     Intake/Output from previous day: 02/04 0701 - 02/05 0700 In: 750.8 [P.O.:365; I.V.:385.8] Out: 130 [Emesis/NG output:100; Chest Tube:30]   Physical Exam:  Cardiovascular: RRR Pulmonary: Clear on the left and diminished on the right Abdomen: Soft, distended,  diffuse tenderness with palpation, bowel sounds present. Extremities: No lower extremity edema. Wounds: Sero sanguinous drainage from chest tube. Chest Tubes: to water seal,+++ air leak with cough  Lab Results: CBC: Recent Labs    05/30/19 0414 06/01/19 0500  WBC 13.5* 5.5  HGB 13.0 12.6  HCT 38.6 38.0  PLT 220 247   BMET:  Recent Labs    05/30/19 0414 06/01/19 0500  NA 136 135  K 3.8 4.2  CL 94* 94*  CO2 31 29  GLUCOSE 128* 116*  BUN 6* 28*  CREATININE 0.50 0.70  CALCIUM 8.9 8.7*    PT/INR: No results for input(s): LABPROT, INR in the last 72 hours. ABG:  INR: Will add last result for INR, ABG once components are confirmed Will add last 4 CBG results once components are confirmed  Assessment/Plan:  1. CV - SR with HR in the 90's. On Amlodipine 5 mg daily, HCTZ 12.5 mg daily, and Irbesartan 300 mg daily. Will hold Avapro  for now. 2.  Pulmonary - On 3 liters of oxygen via . Will wean over next few days. Chest tubes with 30 cc last 24 hours. Chest tube is to water seal and there is a +++ air leak with cough. CXR this am appears to show an increase in right pneumothorax . Will need to place chest tube to suction.  Encourage incentive spirometer.  3. GI- Speech patholgy evaluated yesterday  (as has some difficulty swallowing) and their recommendations will be followed accordingly. KUB yesterday showed non specific gaseous distention of the small bowel (possible post op ileus). KUB this am does not appear significantly worse. May need CT abdomen. Her diet was decreased to a clear liquid yesterday;with further nausea and emesis, may need to make NPO. 4. Regarding tongue numbness, anesthesia evaluated and recommends ENT evaluation if does not improve. She is on Diflucan for probable thrush and magic mouthwash  Melanie M ZimmermanPA-C 06/01/2019,7:18 AM [phone]  Patient seen and examined, agree with above She looks much better this afternoon than she did early this morning Tongue numbness is improving Still has an air leak- will place pigtail catheter to suction. If CXR [facility] tomorrow(apical space back to previous baseline, then will dc chest tube and leave pigtail in place Nausea is better this afternoon. She has taken a small amount of clear liquids without any more emesis. Will continue clears  for now, advance diet as bowel function returns  Melanie Lipps C. Roxan Hockey, MD [facility] [phone]

## 2019-06-02 ENCOUNTER — Inpatient Hospital Stay (HOSPITAL_COMMUNITY): Payer: Medicare PPO

## 2019-06-02 NOTE — Progress Notes (Signed)
5 Days Post-Op Procedure(s) (LRB): XI ROBOTIC ASSISTED THORASCOPY-WEDGE RESECTION AND  RIGHT  UPPER LOBECTOMY (Right) Intercostal Nerve Block (Right) Node Dissection Subjective: Sitting up in bedside chair. Having some burning when she swallows.  Magic mouthwash ordered yesterday.   Objective: Vital signs in last 24 hours: Temp:  [97.4 F (36.3 C)-98 F (36.7 C)] 98 F (36.7 C) (02/06 0736) Pulse Rate:  [78-85] 83 (02/06 0736) Cardiac Rhythm: Normal sinus rhythm (02/06 0701) Resp:  [17-20] 18 (02/06 0736) BP: (100-125)/(71-81) 100/81 (02/06 0736) SpO2:  [91 %-98 %] 91 % (02/06 0736)     Intake/Output from previous day: 02/05 0701 - 02/06 0700 In: 1360 [P.O.:360; I.V.:1000] Out: 70 [Chest Tube:70] Intake/Output this shift: Total I/O In: 240 [P.O.:240] Out: 270 [Urine:200; Chest Tube:70]  General appearance: alert, cooperative and moderate distress Neurologic: intact Heart: regular rate and rhythm Lungs: clear on left, diminished on right, 1+ air leak with cough. Minimal pleural drainage past 24 hours.  Abdomen: Firm, mild diffuse tenderness.  Wound: Has some serous drainage arount the pleural tube. Sites otherwise dry.  Lab Results: Recent Labs    06/01/19 0500  WBC 5.5  HGB 12.6  HCT 38.0  PLT 247   BMET:  Recent Labs    06/01/19 0500  NA 135  K 4.2  CL 94*  CO2 29  GLUCOSE 116*  BUN 28*  CREATININE 0.70  CALCIUM 8.7*    PT/INR: No results for input(s): LABPROT, INR in the last 72 hours. ABG    Component Value Date/Time   PHART 7.468 (H) 05/29/2019 0423   HCO3 39.2 (H) 05/29/2019 0423   TCO2 30 05/28/2019 1115   ACIDBASEDEF 1.0 05/28/2019 1115   O2SAT 69.3 05/29/2019 0423   CBG (last 3)  No results for input(s): GLUCAP in the last 72 hours.   EXAM: PORTABLE CHEST 1 VIEW  COMPARISON:  June 01, 2019.  FINDINGS: Stable cardiomediastinal silhouette. Stable position of 2 right-sided chest tubes. Mild right apical pneumothorax is  noted which is decreased compared to prior exam. Right internal jugular catheter is unchanged. Mild bibasilar subsegmental atelectasis is noted. Bony thorax is unremarkable.  IMPRESSION: Stable position of 2 right-sided chest tubes. Mild right apical pneumothorax is noted which is decreased compared to prior exam.   Electronically Signed   By: Marijo Conception [name]   On: 06/02/2019 09:51 EXAM: PORTABLE ABDOMEN - 1 VIEW  COMPARISON:  June 01, 2019.  FINDINGS: Status post cholecystectomy. Stable mildly dilated small bowel loops are noted in the central portion of the abdomen concerning for ileus or less likely obstruction. Vascular calcifications are noted.  IMPRESSION: Stable dilated small bowel loops are noted most consistent with ileus or possibly distal small bowel obstruction. Continued radiographic follow-up is recommended.   Electronically Signed   By: Marijo Conception [name]   On: 06/02/2019 09:52   Assessment/Plan: S/P Procedure(s) (LRB): XI ROBOTIC ASSISTED THORASCOPY-WEDGE RESECTION AND  RIGHT  UPPER LOBECTOMY (Right) Intercostal Nerve Block (Right) Node Dissection  -POD5 robotic assisted right upper lobectomy. Has smaller right apical space, minimal drainage and small air leak. Will remove the pleural tube and leave the pigtail catheter on water seal. CXR in AM.   -Post-op ileus- having small loose stools. KUB shows continued small bowel dilation. Continue on clear liquids for now. Support with IVF. Continue Reglan and Protonix. F/U KUB in am.   -Hypertension-BP controlled, amlodipine, metoprolol., and HCTZ resumed.   -DVT PPX-enoxaparin   LOS: 5 days    [facility]  Lorenza Burton, PA-C [phone] 06/02/2019

## 2019-06-02 NOTE — Progress Notes (Signed)
1 large chest tube out this am, pt tolerated well, dressing is clean, dry intact. Pig tail chest tube is in water seal so far 30 cc output  since am. Pt have couple of loose BM today. Pt got up to the bed side commode and sat in a recliner, vitals maintained, pain covered with medicines, Son visited her, will continue to monitor the patient  Palma Holter, Therapist, sports

## 2019-06-03 ENCOUNTER — Inpatient Hospital Stay (HOSPITAL_COMMUNITY): Payer: Medicare PPO

## 2019-06-03 LAB — BASIC METABOLIC PANEL
Anion gap: 11 (ref 5–15)
BUN: 15 mg/dL (ref 8–23)
CO2: 20 mmol/L — ABNORMAL LOW (ref 22–32)
Calcium: 7.3 mg/dL — ABNORMAL LOW (ref 8.9–10.3)
Chloride: 98 mmol/L (ref 98–111)
Creatinine, Ser: 0.54 mg/dL (ref 0.44–1.00)
GFR calc Af Amer: >60 mL/min (ref >60)
GFR calc non Af Amer: >60 mL/min (ref >60)
Glucose, Bld: 62 mg/dL — ABNORMAL LOW (ref 70–99)
Potassium: 3.7 mmol/L (ref 3.5–5.1)
Sodium: 129 mmol/L — ABNORMAL LOW (ref 135–145)

## 2019-06-03 LAB — CBC
HCT: 31.8 % — ABNORMAL LOW (ref 36.0–46.0)
Hemoglobin: 10.4 g/dL — ABNORMAL LOW (ref 12.0–15.0)
MCH: 33.4 pg (ref 26.0–34.0)
MCHC: 32.7 g/dL (ref 30.0–36.0)
MCV: 102.3 fL — ABNORMAL HIGH (ref 80.0–100.0)
Platelets: 253 10*3/uL (ref 150–400)
RBC: 3.11 MIL/uL — ABNORMAL LOW (ref 3.87–5.11)
RDW: 13.2 % (ref 11.5–15.5)
WBC: 11.5 10*3/uL — ABNORMAL HIGH (ref 4.0–10.5)
nRBC: 0 % (ref 0.0–0.2)

## 2019-06-03 MED ORDER — ALBUTEROL SULFATE (2.5 MG/3ML) 0.083% IN NEBU: 2.5000 mg | INHALATION_SOLUTION | Freq: Four times a day (QID) | RESPIRATORY_TRACT | Status: DC | PRN

## 2019-06-03 MED ORDER — SUCRALFATE 1 GM/10ML PO SUSP
1.0000 g | Freq: Three times a day (TID) | ORAL | Status: DC
  Administered 2019-06-03 – 2019-06-13 (×40): 1 g via ORAL
  Filled 2019-06-03 (×39): qty 10

## 2019-06-03 NOTE — Plan of Care (Signed)

## 2019-06-03 NOTE — Progress Notes (Signed)
Pt had a more than 6-7 episodes of loose watery stool in past 24 hours, last one is this am around 8.30. After consulting with PA Roddenberry, enteric precaution initiated, waiting for the next stool sample to test C-diff, other wise pt is stable, sitting in a recliner and tolerating more liquid diet in compared to yesterday, will continue to monitor the patient  Palma Holter, RN

## 2019-06-03 NOTE — Progress Notes (Signed)
6 Days Post-Op Procedure(s) (LRB): XI ROBOTIC ASSISTED THORASCOPY-WEDGE RESECTION AND  RIGHT  UPPER LOBECTOMY (Right) Intercostal Nerve Block (Right) Node Dissection Subjective: Feeling better overall.  Had several loose bowel movements yesterday. Abdominal discomfort improved. No nausea or vomiting past 48 hours.   Objective: Vital signs in last 24 hours: Temp:  [97.8 F (36.6 C)-98.4 F (36.9 C)] 98.2 F (36.8 C) (02/07 0750) Pulse Rate:  [75-95] 92 (02/07 0750) Cardiac Rhythm: Normal sinus rhythm (02/07 0750) Resp:  [15-24] 15 (02/07 0750) BP: (104-138)/(67-74) 114/74 (02/07 0750) SpO2:  [90 %-97 %] 94 % (02/07 0750)     Intake/Output from previous day: 02/06 0701 - 02/07 0700 In: 720 [P.O.:720] Out: 764 [Urine:600; Chest Tube:164] Intake/Output this shift: No intake/output data recorded.  Physical Exam: General appearance: alert, cooperative and moderate distress Neurologic: intact Heart: regular rate and rhythm Lungs: clear on left, diminished on right, No air leak is seen today. Minimal pleural drainage past 24 hours but is leaking serous fluid from port sites and around the pleural catheter.  Abdomen: Not as firm, less tenderness.  Wound: Has some serous drainage arount the pleural tube and from port sites.  Lab Results: Recent Labs    06/01/19 0500 06/03/19 0415  WBC 5.5 11.5*  HGB 12.6 10.4*  HCT 38.0 31.8*  PLT 247 253   BMET:  Recent Labs    06/01/19 0500 06/03/19 0415  NA 135 129*  K 4.2 3.7  CL 94* 98  CO2 29 20*  GLUCOSE 116* 62*  BUN 28* 15  CREATININE 0.70 0.54  CALCIUM 8.7* 7.3*    PT/INR: No results for input(s): LABPROT, INR in the last 72 hours. ABG    Component Value Date/Time   PHART 7.468 (H) 05/29/2019 0423   HCO3 39.2 (H) 05/29/2019 0423   TCO2 30 05/28/2019 1115   ACIDBASEDEF 1.0 05/28/2019 1115   O2SAT 69.3 05/29/2019 0423   CBG (last 3)  No results for input(s): GLUCAP in the last 72 hours.   PORTABLE CHEST 1  VIEW  COMPARISON:  06/02/2019 portable chest and earlier.  FINDINGS: Portable AP semi upright view at 0427 hours. The large caliber right chest tube has been removed. The smaller pigtail type chest tube remains in place. Stable mild to moderate sized right apical pneumothorax since yesterday, pleural edge is visible to the right lateral 3/4 rib interface.  Other vertical tubes continue to project over the right chest near midline of unclear significance. Improved right lung volume since yesterday. Stable cardiac size and mediastinal contours. Crowding of chronic pulmonary interstitial markings. No definite edema or pleural effusion. Negative visible bowel gas pattern. Stable visualized osseous structures.  IMPRESSION: 1. One right chest tube removed, one remains. Stable right pneumothorax since yesterday. 2. Improved right lung volume. No new cardiopulmonary abnormality.  ABDOMEN - 1 VIEW  COMPARISON:  Portable abdomen 06/02/2019 and earlier.  FINDINGS: Portable AP supine view at 0428 hours. A pelvic tube, probably a rectal tube, is now in place. Mildly improved bowel gas pattern since yesterday, although there remain mildly dilated gas-filled small bowel loops in the mid abdomen. There is large bowel gas, to the descending colon. Stable cholecystectomy clips. No enteric tube identified. Extensive Aortoiliac calcified atherosclerosis. No acute osseous abnormality identified.  IMPRESSION: 1. Improved but not resolved ileus gas pattern since yesterday. 2.  Aortic Atherosclerosis (ICD10-I70.0).   Electronically Signed   By: Genevie Ann [name]   On: 06/03/2019 06:40  Assessment/Plan: S/P Procedure(s) (LRB): XI ROBOTIC ASSISTED THORASCOPY-WEDGE  RESECTION AND  RIGHT  UPPER LOBECTOMY (Right) Intercostal Nerve Block (Right) Node Dissection  -POD6 robotic assisted right upper lobectomy. Has a stable small right apical space, minimal drainage and no air leak today. Will   leave the pigtail catheter on water seal. CXR in AM. Dressing changes as needed for serous wound drainage.   -Post-op ileus- having small loose stools. KUB shows improvement in small bowel dilation. Will advance diet as tolerated.  Slow  IVF to 25ml/hr. Continue Protonix. WBC increased to >11,000.  Check stool for c.diff.   -Hypertension-BP controlled, amlodipine, metoprolol., and HCTZ resumed.   -DVT PPX-enoxaparin   LOS: 6 days    Antony Odea, PA-C [phone] 06/03/2019

## 2019-06-04 ENCOUNTER — Inpatient Hospital Stay (HOSPITAL_COMMUNITY): Payer: Medicare PPO

## 2019-06-04 LAB — BASIC METABOLIC PANEL
Anion gap: 12 (ref 5–15)
BUN: 5 mg/dL — ABNORMAL LOW (ref 8–23)
CO2: 27 mmol/L (ref 22–32)
Calcium: 7.7 mg/dL — ABNORMAL LOW (ref 8.9–10.3)
Chloride: 92 mmol/L — ABNORMAL LOW (ref 98–111)
Creatinine, Ser: 0.4 mg/dL — ABNORMAL LOW (ref 0.44–1.00)
GFR calc Af Amer: >60 mL/min (ref >60)
GFR calc non Af Amer: >60 mL/min (ref >60)
Glucose, Bld: 95 mg/dL (ref 70–99)
Potassium: 3.1 mmol/L — ABNORMAL LOW (ref 3.5–5.1)
Sodium: 131 mmol/L — ABNORMAL LOW (ref 135–145)

## 2019-06-04 LAB — CBC
HCT: 30 % — ABNORMAL LOW (ref 36.0–46.0)
Hemoglobin: 10.1 g/dL — ABNORMAL LOW (ref 12.0–15.0)
MCH: 33.8 pg (ref 26.0–34.0)
MCHC: 33.7 g/dL (ref 30.0–36.0)
MCV: 100.3 fL — ABNORMAL HIGH (ref 80.0–100.0)
Platelets: 263 10*3/uL (ref 150–400)
RBC: 2.99 MIL/uL — ABNORMAL LOW (ref 3.87–5.11)
RDW: 13 % (ref 11.5–15.5)
WBC: 10.4 10*3/uL (ref 4.0–10.5)
nRBC: 0 % (ref 0.0–0.2)

## 2019-06-04 MED ORDER — POTASSIUM CHLORIDE 20 MEQ/15ML (10%) PO SOLN: 40.0000 meq | Freq: Two times a day (BID) | ORAL | Status: DC

## 2019-06-04 MED ORDER — PANTOPRAZOLE SODIUM 40 MG PO TBEC
40.0000 mg | DELAYED_RELEASE_TABLET | Freq: Two times a day (BID) | ORAL | Status: DC
  Administered 2019-06-04 – 2019-06-13 (×19): 40 mg via ORAL
  Filled 2019-06-04 (×19): qty 1

## 2019-06-04 MED ORDER — POTASSIUM CHLORIDE CRYS ER 20 MEQ PO TBCR: 40.0000 meq | EXTENDED_RELEASE_TABLET | Freq: Two times a day (BID) | ORAL | Status: DC

## 2019-06-04 MED ORDER — OXYCODONE HCL 5 MG PO TABS
5.0000 mg | ORAL_TABLET | ORAL | Status: DC | PRN
  Filled 2019-06-04: qty 2

## 2019-06-04 MED ORDER — SODIUM CHLORIDE 0.9 % IV SOLN: INTRAVENOUS | Status: DC

## 2019-06-04 MED ORDER — FENTANYL CITRATE (PF) 100 MCG/2ML IJ SOLN
50.0000 ug | INTRAMUSCULAR | Status: DC | PRN
  Administered 2019-06-04 – 2019-06-06 (×12): 50 ug via INTRAVENOUS
  Filled 2019-06-04 (×12): qty 2

## 2019-06-04 MED ORDER — POTASSIUM CHLORIDE 10 MEQ/50ML IV SOLN
10.0000 meq | INTRAVENOUS | Status: AC
  Administered 2019-06-04 (×2): 10 meq via INTRAVENOUS
  Filled 2019-06-04: qty 50

## 2019-06-04 MED ORDER — POTASSIUM CHLORIDE 10 MEQ/50ML IV SOLN
10.0000 meq | INTRAVENOUS | Status: AC
  Administered 2019-06-04 (×2): 10 meq via INTRAVENOUS
  Filled 2019-06-04 (×4): qty 50

## 2019-06-04 NOTE — Consult Note (Addendum)
Reason for Consult: Painful swallowing. Referring Physician: Dr. Roxan Hockey.  [name] is an 70 y.o. female.  HPI: [name] is a 69 year old white female with multiple medical problems below who, 1 week ago, underwent a robotic assisted her thoracoscopy along with wedge resection of the right upper lobe for stage well-differentiated adenocarcinoma measuring 1.6 cm in size confined to the lungs; the margins were negative and her postoperative course was complicated by severe retrosternal burning with pain on swallowing and pain and burning in her mouth.  She has been complaining of ulceration of her mouth and has not been able to drink juices or ginger ale. She tried to eat some [place] today which she feels got stuck in the middle of her chest. She has a history of reflux and been diagnosed with hiatal hernia in the past, but denies having problems with dysphagia odynophagia prior to her surgery.  She was diagnosed with lymphocytic colitis on random biopsies of the colon done in 2018.  Prior to that she was diagnosed with choleretic diarrhea after cholecystectomy.  Past Medical History:  Diagnosis Date  . Active smoker   . Anxiety   . Cervical dysplasia   . Complication of anesthesia    "I'm usually hard to wake up"  . Dyslipidemia   . Endometriosis   . History of nuclear stress test 07/07/2010   dipyridamole; low risk, post-stress EF 65%  . Hypertension   . Migraine   . Osteopenia    08/2006 and 08/2008 Dexa Scans w [facility] Blakes   . Pneumonia    hx pneunomia ~2016  . PONV (postoperative nausea and vomiting)   . Sleep apnea    does not wear her CPAP because "it gave me respiratory issues (bronchities and pneumonia)"   Past Surgical History:  Procedure Laterality Date  . BACK SURGERY  2002   RUPTURED DISC   . CARDIAC CATHETERIZATION  11/20/2002   patent coronary arteries   . CHOLECYSTECTOMY  2006  . COLPOSCOPY    . GYNECOLOGIC CRYOSURGERY    . INTERCOSTAL NERVE BLOCK Right  05/28/2019   Procedure: Intercostal Nerve Block;  Surgeon: Melrose Nakayama, MD;  Location: [facility];  Service: Thoracic;  Laterality: Right;  . NODE DISSECTION  05/28/2019   Procedure: Node Dissection;  Surgeon: Melrose Nakayama, MD;  Location: [facility];  Service: Thoracic;;  . NOSE SURGERY     X2  . OOPHORECTOMY     BSO  . SHOULDER SURGERY     ROTATOR CUFF X 2  . THORACOSCOPY  05/28/2019   THORASCOPY-WEDGE RESECTION AND  RIGHT  UPPER LOBECTOMY (Right)  . THROAT SURGERY     REMOVAL OF TUMOR  . TONSILLECTOMY    . VAGINAL HYSTERECTOMY  2000   WITH BSO   Family History  Problem Relation Age of Onset  . Hypertension Mother   . Cancer Mother        LIVER AND LUNG  . Hypertension Brother   . Heart disease Brother   . Hypertension Maternal Grandfather   . Heart disease Maternal Grandfather   . Heart attack Maternal Grandfather    Social History:  reports that she has been smoking cigarettes. She has a 42.00 pack-year smoking history. She has never used smokeless tobacco. She reports current alcohol use of about 10.0 standard drinks of alcohol per week. She reports that she does not use drugs.  Allergies:  Allergies  Allergen Reactions  . Iodinated Diagnostic Agents Itching    itching but no  hives just after iv contrast injection w/ 13 hr prep, future contrast not advised unless absolutely necessary, 13 hr prep would still be advised.//a.calhoun  . Ioxaglate Itching    itching but no hives just after iv contrast injection w/ 13 hr prep, future contrast not advised unless absolutely necessary, 13 hr prep would still be advised.//a.calhoun  . Omnipaque [Iohexol] Anaphylaxis  . Buprenorphine Hcl Nausea And Vomiting  . Codeine Nausea And Vomiting  . Erythromycin   . Morphine And Related Nausea And Vomiting   Medications: I have reviewed the patient's current medications.  Results for orders placed or performed during the hospital encounter of 05/28/19 (from the past 48 hour(s))   CBC     Status: Abnormal   Collection Time: 06/03/19  4:15 AM  Result Value Ref Range   WBC 11.5 (H) 4.0 - 10.5 K/uL   RBC 3.11 (L) 3.87 - 5.11 MIL/uL   Hemoglobin 10.4 (L) 12.0 - 15.0 g/dL   HCT 31.8 (L) 36.0 - 46.0 %   MCV 102.3 (H) 80.0 - 100.0 fL   MCH 33.4 26.0 - 34.0 pg   MCHC 32.7 30.0 - 36.0 g/dL   RDW 13.2 11.5 - 15.5 %   Platelets 253 150 - 400 K/uL   nRBC 0.0 0.0 - 0.2 %    Comment: Performed at [facility], [place] [address] [place], [place] [address]  Basic metabolic panel     Status: Abnormal   Collection Time: 06/03/19  4:15 AM  Result Value Ref Range   Sodium 129 (L) 135 - 145 mmol/L   Potassium 3.7 3.5 - 5.1 mmol/L   Chloride 98 98 - 111 mmol/L   CO2 20 (L) 22 - 32 mmol/L   Glucose, Bld 62 (L) 70 - 99 mg/dL   BUN 15 8 - 23 mg/dL   Creatinine, Ser 0.54 0.44 - 1.00 mg/dL   Calcium 7.3 (L) 8.9 - 10.3 mg/dL   GFR calc non Af Amer >60 >60 mL/min   GFR calc Af Amer >60 >60 mL/min   Anion gap 11 5 - 15    Comment: Performed at [facility], [place] [address], [place], [place] [address]  CBC     Status: Abnormal   Collection Time: 06/04/19  3:06 AM  Result Value Ref Range   WBC 10.4 4.0 - 10.5 K/uL   RBC 2.99 (L) 3.87 - 5.11 MIL/uL   Hemoglobin 10.1 (L) 12.0 - 15.0 g/dL   HCT 30.0 (L) 36.0 - 46.0 %   MCV 100.3 (H) 80.0 - 100.0 fL   MCH 33.8 26.0 - 34.0 pg   MCHC 33.7 30.0 - 36.0 g/dL   RDW 13.0 11.5 - 15.5 %   Platelets 263 150 - 400 K/uL   nRBC 0.0 0.0 - 0.2 %    Comment: Performed at [facility], [place] [address] [place], [place] [address]  Basic metabolic panel     Status: Abnormal   Collection Time: 06/04/19  3:06 AM  Result Value Ref Range   Sodium 131 (L) 135 - 145 mmol/L   Potassium 3.1 (L) 3.5 - 5.1 mmol/L   Chloride 92 (L) 98 - 111 mmol/L   CO2 27 22 - 32 mmol/L   Glucose, Bld 95 70 - 99 mg/dL   BUN 5 (L) 8 - 23 mg/dL   Creatinine, Ser 0.40 (L) 0.44 - 1.00 mg/dL   Calcium 7.7 (L) 8.9 - 10.3 mg/dL   GFR calc non Af  Amer >60 >60  mL/min   GFR calc Af Amer >60 >60 mL/min   Anion gap 12 5 - 15    Comment: Performed at [facility] [address], [place], [place] [address]    DG Abd 1 View  Result Date: 06/03/2019 CLINICAL DATA:  69 year old female postoperative day 6 from right lung VATS. Ileus. EXAM: ABDOMEN - 1 VIEW COMPARISON:  Portable abdomen 06/02/2019 and earlier. FINDINGS: Portable AP supine view at 0428 hours. A pelvic tube, probably a rectal tube, is now in place. Mildly improved bowel gas pattern since yesterday, although there remain mildly dilated gas-filled small bowel loops in the mid abdomen. There is large bowel gas, to the descending colon. Stable cholecystectomy clips. No enteric tube identified. Extensive Aortoiliac calcified atherosclerosis. No acute osseous abnormality identified. IMPRESSION: 1. Improved but not resolved ileus gas pattern since yesterday. 2.  Aortic Atherosclerosis (ICD10-I70.0). Electronically Signed   By: Genevie Ann [name]   On: 06/03/2019 06:40   DG CHEST PORT 1 VIEW  Result Date: 06/04/2019 CLINICAL DATA:  69 year old female postoperative day 7 right upper lobectomy for non-small cell carcinoma. EXAM: PORTABLE CHEST 1 VIEW COMPARISON:  Portable chest 06/03/2019 and earlier. FINDINGS: Portable AP semi upright view at 0513 hours. Stable pigtail type right chest tube. Stable right pneumothorax. Improved right lung base ventilation since yesterday. Stable right IJ central line. Stable cardiac size and mediastinal contours. Larger left lung volume and mildly improved left lung base ventilation also. No new pulmonary opacity. IMPRESSION: 1. Stable right chest tube and right pneumothorax following right upper lobectomy. 2. Larger lung volumes and improved bibasilar ventilation. Electronically Signed   By: Genevie Ann [name]   On: 06/04/2019 06:04   DG CHEST PORT 1 VIEW  Result Date: 06/03/2019 CLINICAL DATA:  69 year old female status post right lung VATS, right upper lobectomy postoperative day 6.  Right chest tubes. EXAM: PORTABLE CHEST 1 VIEW COMPARISON:  06/02/2019 portable chest and earlier. FINDINGS: Portable AP semi upright view at 0427 hours. The large caliber right chest tube has been removed. The smaller pigtail type chest tube remains in place. Stable mild to moderate sized right apical pneumothorax since yesterday, pleural edge is visible to the right lateral 3/4 rib interface. Other vertical tubes continue to project over the right chest near midline of unclear significance. Improved right lung volume since yesterday. Stable cardiac size and mediastinal contours. Crowding of chronic pulmonary interstitial markings. No definite edema or pleural effusion. Negative visible bowel gas pattern. Stable visualized osseous structures. IMPRESSION: 1. One right chest tube removed, one remains. Stable right pneumothorax since yesterday. 2. Improved right lung volume. No new cardiopulmonary abnormality. Electronically Signed   By: Genevie Ann [name]   On: 06/03/2019 06:38   Review of Systems  Constitutional: Negative.   HENT: Positive for mouth sores and trouble swallowing.   Eyes: Negative.   Respiratory: Negative.   Cardiovascular: Negative.   Gastrointestinal: Positive for diarrhea.  Endocrine: Negative.   Genitourinary: Negative.   Musculoskeletal: Negative.   Allergic/Immunologic: Negative.   Neurological: Negative.   Hematological: Negative.   Psychiatric/Behavioral: Negative.    Blood pressure 110/75, pulse 79, temperature (!) 97.3 F (36.3 C), temperature source Oral, resp. rate 15, height 5\' 4"  (1.626 m), weight 49 kg, SpO2 99 %. Physical Exam  Constitutional: She is oriented to person, place, and time. She appears well-developed and well-nourished.  HENT:  Head: Normocephalic and atraumatic.  There is a small ulcer at the tip of her tongue  but no other ulcers or lesions were noted in her mouth or pharynx  Eyes: Pupils are equal, round, and reactive to light. Conjunctivae and EOM are  normal.  Cardiovascular: Normal rate and regular rhythm.  Respiratory: Effort normal and breath sounds normal.  Set chest tube on the right side  GI: Soft. Bowel sounds are normal.  Musculoskeletal:     Cervical back: Normal range of motion and neck supple.  Neurological: She is alert and oriented to person, place, and time.  Skin: Skin is warm and dry.  Psychiatric: She has a normal mood and affect. Her behavior is normal. Judgment and thought content normal.   Assessment/Plan: 1) Severe dysphagia/odynophagia-an EGD is planned for tomorrow; we will continue PPIs for now and make further recommendations thereafter. 2) GERD/hiatal hernia on PPI's. A soft low residue diet has been recommended for now and she should avoid drinking very hot or very cold liquids.  Patient is also on Magic mouthwash and Maalox for her aphthous ulcers. 3) History of lymphocytic colitis diagnosed in 2018. 4) Well differentiated invasive adenocarcinoma the right upper lobe status post robotic assisted thoracoscopy with wedge resection right upper lobe lobectomy. 1 week ago. 4) HTN/hyperlipidemia.Marland Kitchen 5) Anxiety disorder. 6) Migraine headache Juanita Craver  06/04/2019, 11:35 AM

## 2019-06-04 NOTE — H&P (View-Only) (Signed)
Reason for Consult: Painful swallowing. Referring Physician: Dr. Roxan Hockey.  [name] is an 69 y.o. female.  HPI: [name] is a 69 year old white female with multiple medical problems below who, 1 week ago, underwent a robotic assisted her thoracoscopy along with wedge resection of the right upper lobe for stage well-differentiated adenocarcinoma measuring 1.6 cm in size confined to the lungs; the margins were negative and her postoperative course was complicated by severe retrosternal burning with pain on swallowing and pain and burning in her mouth.  She has been complaining of ulceration of her mouth and has not been able to drink juices or ginger ale. She tried to eat some [place] today which she feels got stuck in the middle of her chest. She has a history of reflux and been diagnosed with hiatal hernia in the past, but denies having problems with dysphagia odynophagia prior to her surgery.  She was diagnosed with lymphocytic colitis on random biopsies of the colon done in 2018.  Prior to that she was diagnosed with choleretic diarrhea after cholecystectomy.  Past Medical History:  Diagnosis Date  . Active smoker   . Anxiety   . Cervical dysplasia   . Complication of anesthesia    "I'm usually hard to wake up"  . Dyslipidemia   . Endometriosis   . History of nuclear stress test 07/07/2010   dipyridamole; low risk, post-stress EF 65%  . Hypertension   . Migraine   . Osteopenia    08/2006 and 08/2008 Dexa Scans w [facility] Blakes   . Pneumonia    hx pneunomia ~2016  . PONV (postoperative nausea and vomiting)   . Sleep apnea    does not wear her CPAP because "it gave me respiratory issues (bronchities and pneumonia)"   Past Surgical History:  Procedure Laterality Date  . BACK SURGERY  2002   RUPTURED DISC   . CARDIAC CATHETERIZATION  11/20/2002   patent coronary arteries   . CHOLECYSTECTOMY  2006  . COLPOSCOPY    . GYNECOLOGIC CRYOSURGERY    . INTERCOSTAL NERVE BLOCK Right  05/28/2019   Procedure: Intercostal Nerve Block;  Surgeon: Melrose Nakayama, MD;  Location: [facility];  Service: Thoracic;  Laterality: Right;  . NODE DISSECTION  05/28/2019   Procedure: Node Dissection;  Surgeon: Melrose Nakayama, MD;  Location: [facility] OR;  Service: Thoracic;;  . NOSE SURGERY     X2  . OOPHORECTOMY     BSO  . SHOULDER SURGERY     ROTATOR CUFF X 2  . THORACOSCOPY  05/28/2019   THORASCOPY-WEDGE RESECTION AND  RIGHT  UPPER LOBECTOMY (Right)  . THROAT SURGERY     REMOVAL OF TUMOR  . TONSILLECTOMY    . VAGINAL HYSTERECTOMY  2000   WITH BSO   Family History  Problem Relation Age of Onset  . Hypertension Mother   . Cancer Mother        LIVER AND LUNG  . Hypertension Brother   . Heart disease Brother   . Hypertension Maternal Grandfather   . Heart disease Maternal Grandfather   . Heart attack Maternal Grandfather    Social History:  reports that she has been smoking cigarettes. She has a 42.00 pack-year smoking history. She has never used smokeless tobacco. She reports current alcohol use of about 10.0 standard drinks of alcohol per week. She reports that she does not use drugs.  Allergies:  Allergies  Allergen Reactions  . Iodinated Diagnostic Agents Itching    itching but no  hives just after iv contrast injection w/ 13 hr prep, future contrast not advised unless absolutely necessary, 13 hr prep would still be advised.//a.calhoun  . Ioxaglate Itching    itching but no hives just after iv contrast injection w/ 13 hr prep, future contrast not advised unless absolutely necessary, 13 hr prep would still be advised.//a.calhoun  . Omnipaque [Iohexol] Anaphylaxis  . Buprenorphine Hcl Nausea And Vomiting  . Codeine Nausea And Vomiting  . Erythromycin   . Morphine And Related Nausea And Vomiting   Medications: I have reviewed the patient's current medications.  Results for orders placed or performed during the hospital encounter of 05/28/19 (from the past 48 hour(s))   CBC     Status: Abnormal   Collection Time: 06/03/19  4:15 AM  Result Value Ref Range   WBC 11.5 (H) 4.0 - 10.5 K/uL   RBC 3.11 (L) 3.87 - 5.11 MIL/uL   Hemoglobin 10.4 (L) 12.0 - 15.0 g/dL   HCT 31.8 (L) 36.0 - 46.0 %   MCV 102.3 (H) 80.0 - 100.0 fL   MCH 33.4 26.0 - 34.0 pg   MCHC 32.7 30.0 - 36.0 g/dL   RDW 13.2 11.5 - 15.5 %   Platelets 253 150 - 400 K/uL   nRBC 0.0 0.0 - 0.2 %    Comment: Performed at [facility], [place] [address], [place], [place] [address]  Basic metabolic panel     Status: Abnormal   Collection Time: 06/03/19  4:15 AM  Result Value Ref Range   Sodium 129 (L) 135 - 145 mmol/L   Potassium 3.7 3.5 - 5.1 mmol/L   Chloride 98 98 - 111 mmol/L   CO2 20 (L) 22 - 32 mmol/L   Glucose, Bld 62 (L) 70 - 99 mg/dL   BUN 15 8 - 23 mg/dL   Creatinine, Ser 0.54 0.44 - 1.00 mg/dL   Calcium 7.3 (L) 8.9 - 10.3 mg/dL   GFR calc non Af Amer >60 >60 mL/min   GFR calc Af Amer >60 >60 mL/min   Anion gap 11 5 - 15    Comment: Performed at [facility], [place] [address], [place], [place] [address]  CBC     Status: Abnormal   Collection Time: 06/04/19  3:06 AM  Result Value Ref Range   WBC 10.4 4.0 - 10.5 K/uL   RBC 2.99 (L) 3.87 - 5.11 MIL/uL   Hemoglobin 10.1 (L) 12.0 - 15.0 g/dL   HCT 30.0 (L) 36.0 - 46.0 %   MCV 100.3 (H) 80.0 - 100.0 fL   MCH 33.8 26.0 - 34.0 pg   MCHC 33.7 30.0 - 36.0 g/dL   RDW 13.0 11.5 - 15.5 %   Platelets 263 150 - 400 K/uL   nRBC 0.0 0.0 - 0.2 %    Comment: Performed at [facility], [place] [address] [place],  [address]  Basic metabolic panel     Status: Abnormal   Collection Time: 06/04/19  3:06 AM  Result Value Ref Range   Sodium 131 (L) 135 - 145 mmol/L   Potassium 3.1 (L) 3.5 - 5.1 mmol/L   Chloride 92 (L) 98 - 111 mmol/L   CO2 27 22 - 32 mmol/L   Glucose, Bld 95 70 - 99 mg/dL   BUN 5 (L) 8 - 23 mg/dL   Creatinine, Ser 0.40 (L) 0.44 - 1.00 mg/dL   Calcium 7.7 (L) 8.9 - 10.3 mg/dL   GFR calc non Af  Amer >60 >60  mL/min   GFR calc Af Amer >60 >60 mL/min   Anion gap 12 5 - 15    Comment: Performed at [facility] [address], [place], [place] [address]    DG Abd 1 View  Result Date: 06/03/2019 CLINICAL DATA:  69 year old female postoperative day 6 from right lung VATS. Ileus. EXAM: ABDOMEN - 1 VIEW COMPARISON:  Portable abdomen 06/02/2019 and earlier. FINDINGS: Portable AP supine view at 0428 hours. A pelvic tube, probably a rectal tube, is now in place. Mildly improved bowel gas pattern since yesterday, although there remain mildly dilated gas-filled small bowel loops in the mid abdomen. There is large bowel gas, to the descending colon. Stable cholecystectomy clips. No enteric tube identified. Extensive Aortoiliac calcified atherosclerosis. No acute osseous abnormality identified. IMPRESSION: 1. Improved but not resolved ileus gas pattern since yesterday. 2.  Aortic Atherosclerosis (ICD10-I70.0). Electronically Signed   By: Genevie Ann [name]   On: 06/03/2019 06:40   DG CHEST PORT 1 VIEW  Result Date: 06/04/2019 CLINICAL DATA:  69 year old female postoperative day 7 right upper lobectomy for non-small cell carcinoma. EXAM: PORTABLE CHEST 1 VIEW COMPARISON:  Portable chest 06/03/2019 and earlier. FINDINGS: Portable AP semi upright view at 0513 hours. Stable pigtail type right chest tube. Stable right pneumothorax. Improved right lung base ventilation since yesterday. Stable right IJ central line. Stable cardiac size and mediastinal contours. Larger left lung volume and mildly improved left lung base ventilation also. No new pulmonary opacity. IMPRESSION: 1. Stable right chest tube and right pneumothorax following right upper lobectomy. 2. Larger lung volumes and improved bibasilar ventilation. Electronically Signed   By: Genevie Ann [name]   On: 06/04/2019 06:04   DG CHEST PORT 1 VIEW  Result Date: 06/03/2019 CLINICAL DATA:  69 year old female status post right lung VATS, right upper lobectomy postoperative day 6.  Right chest tubes. EXAM: PORTABLE CHEST 1 VIEW COMPARISON:  06/02/2019 portable chest and earlier. FINDINGS: Portable AP semi upright view at 0427 hours. The large caliber right chest tube has been removed. The smaller pigtail type chest tube remains in place. Stable mild to moderate sized right apical pneumothorax since yesterday, pleural edge is visible to the right lateral 3/4 rib interface. Other vertical tubes continue to project over the right chest near midline of unclear significance. Improved right lung volume since yesterday. Stable cardiac size and mediastinal contours. Crowding of chronic pulmonary interstitial markings. No definite edema or pleural effusion. Negative visible bowel gas pattern. Stable visualized osseous structures. IMPRESSION: 1. One right chest tube removed, one remains. Stable right pneumothorax since yesterday. 2. Improved right lung volume. No new cardiopulmonary abnormality. Electronically Signed   By: Genevie Ann [name]   On: 06/03/2019 06:38   Review of Systems  Constitutional: Negative.   HENT: Positive for mouth sores and trouble swallowing.   Eyes: Negative.   Respiratory: Negative.   Cardiovascular: Negative.   Gastrointestinal: Positive for diarrhea.  Endocrine: Negative.   Genitourinary: Negative.   Musculoskeletal: Negative.   Allergic/Immunologic: Negative.   Neurological: Negative.   Hematological: Negative.   Psychiatric/Behavioral: Negative.    Blood pressure 110/75, pulse 79, temperature (!) 97.3 F (36.3 C), temperature source Oral, resp. rate 15, height 5\' 4"  (1.626 m), weight 49 kg, SpO2 99 %. Physical Exam  Constitutional: She is oriented to person, place, and time. She appears well-developed and well-nourished.  HENT:  Head: Normocephalic and atraumatic.  There is a small ulcer at the tip of her tongue  but no other ulcers or lesions were noted in her mouth or pharynx  Eyes: Pupils are equal, round, and reactive to light. Conjunctivae and EOM are  normal.  Cardiovascular: Normal rate and regular rhythm.  Respiratory: Effort normal and breath sounds normal.  Set chest tube on the right side  GI: Soft. Bowel sounds are normal.  Musculoskeletal:     Cervical back: Normal range of motion and neck supple.  Neurological: She is alert and oriented to person, place, and time.  Skin: Skin is warm and dry.  Psychiatric: She has a normal mood and affect. Her behavior is normal. Judgment and thought content normal.   Assessment/Plan: 1) Severe dysphagia/odynophagia-an EGD is planned for tomorrow; we will continue PPIs for now and make further recommendations thereafter. 2) GERD/hiatal hernia on PPI's. A soft low residue diet has been recommended for now and she should avoid drinking very hot or very cold liquids.  Patient is also on Magic mouthwash and Maalox for her aphthous ulcers. 3) History of lymphocytic colitis diagnosed in 2018. 4) Well differentiated invasive adenocarcinoma the right upper lobe status post robotic assisted thoracoscopy with wedge resection right upper lobe lobectomy. 1 week ago. 4) HTN/hyperlipidemia.Marland Kitchen 5) Anxiety disorder. 6) Migraine headache [name]  06/04/2019, 11:35 AM

## 2019-06-04 NOTE — Progress Notes (Addendum)
[address]       [place] [address]             [phone]      7 Days Post-Op Procedure(s) (LRB): XI ROBOTIC ASSISTED THORASCOPY-WEDGE RESECTION AND  RIGHT  UPPER LOBECTOMY (Right) Intercostal Nerve Block (Right) Node Dissection   Subjective:  Patient states she has more pain today than she has previously.  She has had no further diarrhea or vomiting.  She states that she isn't able to really eat or drink due to pain/burning in her throat/esophagus.  She also states food gets, stuck which is not a new issue for her.  Objective: Vital signs in last 24 hours: Temp:  [97.4 F (36.3 C)-98.2 F (36.8 C)] 98 F (36.7 C) (02/08 0217) Pulse Rate:  [77-92] 79 (02/08 0217) Cardiac Rhythm: Normal sinus rhythm (02/08 0032) Resp:  [15-21] 15 (02/08 0217) BP: (94-132)/(67-86) 132/86 (02/08 0217) SpO2:  [94 %-100 %] 99 % (02/08 0217)  Intake/Output from previous day: 02/07 0701 - 02/08 0700 In: 720 [P.O.:720] Out: 1060 [Urine:1000; Chest Tube:60]  General appearance: alert, cooperative and no distress Heart: regular rate and rhythm Lungs: clear to auscultation bilaterally Abdomen: soft, non-tender; bowel sounds normal; no masses,  no organomegaly Extremities: extremities normal, atraumatic, no cyanosis or edema Wound: clean and dry  Lab Results: Recent Labs    06/03/19 0415 06/04/19 0306  WBC 11.5* 10.4  HGB 10.4* 10.1*  HCT 31.8* 30.0*  PLT 253 263   BMET:  Recent Labs    06/03/19 0415 06/04/19 0306  NA 129* 131*  K 3.7 3.1*  CL 98 92*  CO2 20* 27  GLUCOSE 62* 95  BUN 15 5*  CREATININE 0.54 0.40*  CALCIUM 7.3* 7.7*    PT/INR: No results for input(s): LABPROT, INR in the last 72 hours. ABG    Component Value Date/Time   PHART 7.468 (H) 05/29/2019 0423   HCO3 39.2 (H) 05/29/2019 0423   TCO2 30 05/28/2019 1115   ACIDBASEDEF 1.0 05/28/2019 1115   O2SAT 69.3 05/29/2019 0423   CBG (last 3)  No results for input(s): GLUCAP in the last  72 hours.  Assessment/Plan: S/P Procedure(s) (LRB): XI ROBOTIC ASSISTED THORASCOPY-WEDGE RESECTION AND  RIGHT  UPPER LOBECTOMY (Right) Intercostal Nerve Block (Right) Node Dissection  1. CV- hemodynamically stable 2. Pulm- persistent right pneumothorax, pigtail in place on water seal, no air leak present.Marland Kitchen leave on suction today 3. GI- Post operative ileus resolving, per patient has discomfort/burning with intake of liquids, will change diet to soft diet to hopefully get food that isn't quite as acidic.... patient has a long standing history of GI issues, no further diarrhea 4. Suspected Thrush- on Magic mouthwash, Maalox for aphtous ulcers 5. Lovenox for DVT prophylaxis 6. dispo- patient stable, right pneumothorax persists, GI issues remain an issue continue supportive care, advance diet to soft diet, repeat CXR in AM   LOS: 7 days    Ellwood Handler, PA-C  06/04/2019 Patient seen and examined Has a minimal air leak, she has an apical space post lobectomy not a pneumothorax per se The tube is on water seal not suction as written above, continue water seal today Her ileus has improved, but her dysphagia has not and is her primary complaint now- she has been treated with diflucan and is receiving magic mouthwash. Will increase protonix to BID in case there is a reflux component. Will ask GI to see, may need endoscopy  Remo Lipps C.  Roxan Hockey, MD [facility] [phone]

## 2019-06-04 NOTE — Plan of Care (Signed)
  Problem: Health Behavior/Discharge Planning: Goal: Ability to manage health-related needs will improve Outcome: Progressing   Problem: Education: Goal: Knowledge of General Education information will improve Description: Including pain rating scale, medication(s)/side effects and non-pharmacologic comfort measures Outcome: Progressing   Problem: Clinical Measurements: Goal: Ability to maintain clinical measurements within normal limits will improve Outcome: Progressing Goal: Will remain free from infection Outcome: Progressing Goal: Diagnostic test results will improve Outcome: Progressing Goal: Respiratory complications will improve Outcome: Progressing Goal: Cardiovascular complication will be avoided Outcome: Progressing   Problem: Activity: Goal: Risk for activity intolerance will decrease Outcome: Progressing   Problem: Nutrition: Goal: Adequate nutrition will be maintained Outcome: Progressing   Problem: Coping: Goal: Level of anxiety will decrease Outcome: Progressing   Problem: Elimination: Goal: Will not experience complications related to bowel motility Outcome: Progressing Goal: Will not experience complications related to urinary retention Outcome: Progressing   Problem: Pain Managment: Goal: General experience of comfort will improve Outcome: Progressing   Problem: Safety: Goal: Ability to remain free from injury will improve Outcome: Progressing   Problem: Skin Integrity: Goal: Risk for impaired skin integrity will decrease Outcome: Progressing

## 2019-06-05 ENCOUNTER — Inpatient Hospital Stay (HOSPITAL_COMMUNITY): Payer: Medicare PPO

## 2019-06-05 ENCOUNTER — Encounter (HOSPITAL_COMMUNITY)
Admission: RE | Disposition: A | Discharge status: Home Health | Payer: Self-pay | Source: Home / Self Care | Attending: Thoracic Surgery (Cardiothoracic Vascular Surgery)

## 2019-06-05 ENCOUNTER — Inpatient Hospital Stay (HOSPITAL_COMMUNITY): Payer: Medicare PPO | Admitting: Certified Registered Nurse Anesthetist

## 2019-06-05 ENCOUNTER — Encounter (HOSPITAL_COMMUNITY): Payer: Self-pay | Admitting: Thoracic Surgery (Cardiothoracic Vascular Surgery)

## 2019-06-05 HISTORY — PX: ESOPHAGOGASTRODUODENOSCOPY: SHX5428

## 2019-06-05 HISTORY — PX: BIOPSY: SHX5522

## 2019-06-05 LAB — BASIC METABOLIC PANEL
Anion gap: 11 (ref 5–15)
BUN: <5 mg/dL — ABNORMAL LOW (ref 8–23)
CO2: 27 mmol/L (ref 22–32)
Calcium: 7.8 mg/dL — ABNORMAL LOW (ref 8.9–10.3)
Chloride: 93 mmol/L — ABNORMAL LOW (ref 98–111)
Creatinine, Ser: 0.4 mg/dL — ABNORMAL LOW (ref 0.44–1.00)
GFR calc Af Amer: >60 mL/min (ref >60)
GFR calc non Af Amer: >60 mL/min (ref >60)
Glucose, Bld: 93 mg/dL (ref 70–99)
Potassium: 3.2 mmol/L — ABNORMAL LOW (ref 3.5–5.1)
Sodium: 131 mmol/L — ABNORMAL LOW (ref 135–145)

## 2019-06-05 SURGERY — EGD (ESOPHAGOGASTRODUODENOSCOPY)
Anesthesia: Monitor Anesthesia Care | Laterality: Left

## 2019-06-05 MED ORDER — GLYCOPYRROLATE PF 0.2 MG/ML IJ SOSY
PREFILLED_SYRINGE | INTRAMUSCULAR | Status: DC | PRN
  Administered 2019-06-05: .1 mg via INTRAVENOUS

## 2019-06-05 MED ORDER — SODIUM CHLORIDE 0.9 % IV SOLN: INTRAVENOUS | Status: DC | PRN

## 2019-06-05 MED ORDER — PROPOFOL 10 MG/ML IV BOLUS
INTRAVENOUS | Status: DC | PRN
  Administered 2019-06-05: 40 mg via INTRAVENOUS
  Administered 2019-06-05: 70 mg via INTRAVENOUS

## 2019-06-05 MED ORDER — LIDOCAINE 2% (20 MG/ML) 5 ML SYRINGE
INTRAMUSCULAR | Status: DC | PRN
  Administered 2019-06-05: 50 mg via INTRAVENOUS

## 2019-06-05 NOTE — Transfer of Care (Signed)
Immediate Anesthesia Transfer of Care Note  Patient: [name]  Procedure(s) Performed: ESOPHAGOGASTRODUODENOSCOPY (EGD) (Left ) BIOPSY  Patient Location: Endoscopy Unit  Anesthesia Type:MAC  Level of Consciousness: awake and patient cooperative  Airway & Oxygen Therapy: Patient Spontanous Breathing and Patient connected to nasal cannula oxygen  Post-op Assessment: Report given to RN and Post -op Vital signs reviewed and stable  Post vital signs: Reviewed and stable  Last Vitals:  Vitals Value Taken Time  BP 100/62 06/05/19 1312  Temp    Pulse 82 06/05/19 1313  Resp 24 06/05/19 1313  SpO2 99 % 06/05/19 1313  Vitals shown include unvalidated device data.  Last Pain:  Vitals:   06/05/19 1048  TempSrc: Oral  PainSc: 0-No pain      Patients Stated Pain Goal: 2 (44/96/75 9163)  Complications: No apparent anesthesia complications

## 2019-06-05 NOTE — Anesthesia Postprocedure Evaluation (Signed)
Anesthesia Post Note  Patient: [name]  Procedure(s) Performed: ESOPHAGOGASTRODUODENOSCOPY (EGD) (Left ) BIOPSY     Patient location during evaluation: PACU Anesthesia Type: MAC Level of consciousness: awake and alert Pain management: pain level controlled Vital Signs Assessment: post-procedure vital signs reviewed and stable Respiratory status: spontaneous breathing, nonlabored ventilation, respiratory function stable and patient connected to nasal cannula oxygen Cardiovascular status: stable and blood pressure returned to baseline Postop Assessment: no apparent nausea or vomiting Anesthetic complications: no    Last Vitals:  Vitals:   06/05/19 1631 06/05/19 1700  BP: 132/82   Pulse: 93 90  Resp: (!) 25 (!) 21  Temp: 36.6 C   SpO2: 93% 97%    Last Pain:  Vitals:   06/05/19 1631  TempSrc: Oral  PainSc: 4                  Braycen Burandt DAVID

## 2019-06-05 NOTE — Interval H&P Note (Signed)
History and Physical Interval Note:  06/05/2019 12:58 PM  [name]  has presented today for surgery, with the diagnosis of EGD.  The various methods of treatment have been discussed with the patient and family. After consideration of risks, benefits and other options for treatment, the patient has consented to  Procedure(s): ESOPHAGOGASTRODUODENOSCOPY (EGD) (Left) as a surgical intervention.  The patient's history has been reviewed, patient examined, no change in status, stable for surgery.  I have reviewed the patient's chart and labs.  Questions were answered to the patient's satisfaction.     [name]

## 2019-06-05 NOTE — Anesthesia Preprocedure Evaluation (Signed)
Anesthesia Evaluation  Patient identified by MRN, date of birth, ID band Patient awake    Reviewed: Allergy & Precautions, NPO status , Patient's Chart, lab work & pertinent test results  History of Anesthesia Complications (+) PONV and history of anesthetic complications  Airway Mallampati: II  TM Distance: >3 FB  Positive for:  Tracheal deviation   Dental  (+) Teeth Intact, Chipped, Dental Advisory Given,    Pulmonary sleep apnea , pneumonia, Current Smoker and Patient abstained from smoking.,    breath sounds clear to auscultation       Cardiovascular hypertension,  Rhythm:Regular Rate:Normal     Neuro/Psych  Headaches, PSYCHIATRIC DISORDERS Anxiety    GI/Hepatic   Endo/Other    Renal/GU      Musculoskeletal   Abdominal   Peds  Hematology   Anesthesia Other Findings   Reproductive/Obstetrics                             Anesthesia Physical  Anesthesia Plan  ASA: III  Anesthesia Plan: MAC   Post-op Pain Management:    Induction: Intravenous  PONV Risk Score and Plan: 2 and Propofol infusion and TIVA  Airway Management Planned: Natural Airway  Additional Equipment:   Intra-op Plan:   Post-operative Plan:   Informed Consent: I have reviewed the patients History and Physical, chart, labs and discussed the procedure including the risks, benefits and alternatives for the proposed anesthesia with the patient or authorized representative who has indicated his/her understanding and acceptance.     Dental advisory given  Plan Discussed with: CRNA  Anesthesia Plan Comments:         Anesthesia Quick Evaluation

## 2019-06-05 NOTE — Op Note (Signed)
[facility] Patient Name: [name] Procedure Date : 06/05/2019 MRN: [id number] Attending MD: Carol Ada , MD Date of Birth: [birth year] CSN: [id number] Age: 69 Admit Type: Inpatient Procedure:                Upper GI endoscopy Indications:              Dysphagia Providers:                Carol Ada, MD, Benay Pillow, RN, Janeece Agee,                            Technician, Vania Rea, CRNA Referring MD:              Medicines:                Propofol per Anesthesia Complications:            No immediate complications. Estimated Blood Loss:     Estimated blood loss: none. Estimated blood loss                            was minimal. Procedure:                Pre-Anesthesia Assessment:                           - Prior to the procedure, a History and Physical                            was performed, and patient medications and                            allergies were reviewed. The patient's tolerance of                            previous anesthesia was also reviewed. The risks                            and benefits of the procedure and the sedation                            options and risks were discussed with the patient.                            All questions were answered, and informed consent                            was obtained. Prior Anticoagulants: The patient has                            taken no previous anticoagulant or antiplatelet                            agents. ASA Grade Assessment: III - A patient with  severe systemic disease. After reviewing the risks                            and benefits, the patient was deemed in                            satisfactory condition to undergo the procedure.                           - Sedation was administered by an anesthesia                            professional. Deep sedation was attained.                           After obtaining informed consent, the endoscope was                          passed under direct vision. Throughout the                            procedure, the patient's blood pressure, pulse, and                            oxygen saturations were monitored continuously. The                            GIF-H190 ([id number] Olympus gastroscope was                            introduced through the mouth, and advanced to the                            second part of duodenum. The upper GI endoscopy was                            accomplished without difficulty. The patient                            tolerated the procedure well. Scope In: Scope Out: Findings:      LA Grade D (one or more mucosal breaks involving at least 75% of       esophageal circumference) esophagitis with no bleeding was found in the       middle and lower third of the esophagus.      Two non-bleeding superficial gastric ulcers with no stigmata of bleeding       were found in the gastric antrum. The largest lesion was 10 mm in       largest dimension. Biopsies were taken with a cold forceps for histology.      The examined duodenum was normal. Impression:               - LA Grade D reflux esophagitis with no bleeding.                           -  Non-bleeding gastric ulcers with no stigmata of                            bleeding. Biopsied.                           - Normal examined duodenum. Recommendation:           - Return patient to hospital ward for ongoing care.                           - Resume regular diet.                           - Continue present medications. Procedure Code(s):        --- Professional ---                           [phone], Esophagogastroduodenoscopy, flexible,                            transoral; with biopsy, single or multiple Diagnosis Code(s):        --- Professional ---                           K21.00, Gastro-esophageal reflux disease with                            esophagitis, without bleeding                           K25.9, Gastric  ulcer, unspecified as acute or                            chronic, without hemorrhage or perforation                           R13.10, Dysphagia, unspecified CPT copyright 2019 [employer]. All rights reserved. The codes documented in this report are preliminary and upon coder review may  be revised to meet current compliance requirements. Carol Ada, MD Carol Ada, MD 06/05/2019 1:14:20 PM This report has been signed electronically. Number of Addenda: 0

## 2019-06-05 NOTE — Plan of Care (Signed)
  Problem: Education: Goal: Knowledge of General Education information will improve Description: Including pain rating scale, medication(s)/side effects and non-pharmacologic comfort measures Outcome: Completed/Met   Problem: Health Behavior/Discharge Planning: Goal: Ability to manage health-related needs will improve Outcome: Progressing   Problem: Clinical Measurements: Goal: Ability to maintain clinical measurements within normal limits will improve Outcome: Progressing Goal: Will remain free from infection Outcome: Progressing Goal: Diagnostic test results will improve Outcome: Progressing Goal: Respiratory complications will improve Outcome: Progressing Goal: Cardiovascular complication will be avoided Outcome: Progressing   Problem: Activity: Goal: Risk for activity intolerance will decrease Outcome: Progressing   Problem: Nutrition: Goal: Adequate nutrition will be maintained Outcome: Not Progressing   Problem: Coping: Goal: Level of anxiety will decrease Outcome: Progressing

## 2019-06-05 NOTE — Progress Notes (Signed)
Patient refused to take cholestyramine. Dressing on back site was saturated and notified PA Erin regarding this matter. Pt wanted to change later. Continue to monitor dressing. HS [employer]

## 2019-06-05 NOTE — Progress Notes (Addendum)
      [address]       [place] [address]             [phone]      8 Days Post-Op Procedure(s) (LRB): XI ROBOTIC ASSISTED THORASCOPY-WEDGE RESECTION AND  RIGHT  UPPER LOBECTOMY (Right) Intercostal Nerve Block (Right) Node Dissection   Subjective:  No new complaints.  She continues to have dysphagia, states soft diet didn't really help.  She had a very small BM overnight.  Objective: Vital signs in last 24 hours: Temp:  [97.3 F (36.3 C)-98.1 F (36.7 C)] 97.4 F (36.3 C) (02/09 0350) Pulse Rate:  [72-76] 75 (02/09 0350) Cardiac Rhythm: Supraventricular tachycardia (02/09 0445) Resp:  [16-19] 16 (02/09 0350) BP: (110-116)/(70-75) 112/73 (02/09 0350) SpO2:  [97 %-100 %] 100 % (02/09 0350)  Intake/Output from previous day: 02/08 0701 - 02/09 0700 In: 1620.3 [P.O.:240; I.V.:1240.8; IV Piggyback:139.5] Out: 310 [Urine:300; Chest Tube:10]  General appearance: alert, cooperative and no distress Heart: regular rate and rhythm Lungs: clear to auscultation bilaterally Abdomen: soft, non-tender; bowel sounds normal; no masses,  no organomegaly Extremities: extremities normal, atraumatic, no cyanosis or edema Wound: clean and dry  Lab Results: Recent Labs    06/03/19 0415 06/04/19 0306  WBC 11.5* 10.4  HGB 10.4* 10.1*  HCT 31.8* 30.0*  PLT 253 263   BMET:  Recent Labs    06/04/19 0306 06/05/19 0415  NA 131* 131*  K 3.1* 3.2*  CL 92* 93*  CO2 27 27  GLUCOSE 95 93  BUN 5* <5*  CREATININE 0.40* 0.40*  CALCIUM 7.7* 7.8*    PT/INR: No results for input(s): LABPROT, INR in the last 72 hours. ABG    Component Value Date/Time   PHART 7.468 (H) 05/29/2019 0423   HCO3 39.2 (H) 05/29/2019 0423   TCO2 30 05/28/2019 1115   ACIDBASEDEF 1.0 05/28/2019 1115   O2SAT 69.3 05/29/2019 0423   CBG (last 3)  No results for input(s): GLUCAP in the last 72 hours.  Assessment/Plan: S/P Procedure(s) (LRB): XI ROBOTIC ASSISTED THORASCOPY-WEDGE RESECTION  AND  RIGHT  UPPER LOBECTOMY (Right) Intercostal Nerve Block (Right) Node Dissection  1. CV- hemodynamically stable 2. Pulm- stable right apical space, + air leak, leave chest tube on water seal 3. GI- Ileus resolved, persistent dysphagia, odynphagia persists, not getting much relief from PPI, Magic Mouthwash, Maalox prn, GI evaluation appreciated, for EGD today 4. H/O lymphocytic colitis- diarrhea resolved 5. Dispo- Lovenox for DVT prophylaxis 6. Dispo- patient stable, CXR with stable right apical space, leave chest tube to water seal today with air leak present, for EGD evaluation today.. hopefully can provide some source for dysphagia, repeat CXR in AM   LOS: 8 days    Ellwood Handler, PA-C  06/05/2019  Patient seen, agree with above For EGD this AM  Remo Lipps C. Roxan Hockey, MD [facility] [phone]

## 2019-06-05 NOTE — Anesthesia Procedure Notes (Signed)
Procedure Name: MAC Date/Time: 06/05/2019 1:05 PM Performed by: Orlie Dakin, CRNA Pre-anesthesia Checklist: Patient identified, Emergency Drugs available, Suction available and Patient being monitored Patient Re-evaluated:Patient Re-evaluated prior to induction Oxygen Delivery Method: Nasal cannula Preoxygenation: Pre-oxygenation with 100% oxygen Induction Type: IV induction Placement Confirmation: positive ETCO2

## 2019-06-05 NOTE — Progress Notes (Signed)
There was oozing from one of incision point on the back (Serous). Night shift nurse put on the new dressing, but it was wet in the morning. Changed dressing. Not much drainage from chest tube site (107ml). HS [employer]

## 2019-06-06 ENCOUNTER — Inpatient Hospital Stay (HOSPITAL_COMMUNITY): Payer: Medicare PPO

## 2019-06-06 ENCOUNTER — Other Ambulatory Visit: Payer: Self-pay | Admitting: Physician Assistant

## 2019-06-06 LAB — SURGICAL PATHOLOGY

## 2019-06-06 MED ORDER — OXYCODONE HCL 5 MG/5ML PO SOLN
5.0000 mg | ORAL | Status: DC | PRN
  Administered 2019-06-06 (×3): 10 mg via ORAL
  Administered 2019-06-07: 5 mg via ORAL
  Filled 2019-06-06 (×2): qty 10
  Filled 2019-06-06: qty 5
  Filled 2019-06-06: qty 10

## 2019-06-06 MED ORDER — CHOLESTYRAMINE 4 G PO PACK
4.0000 g | PACK | Freq: Every day | ORAL | Status: DC | PRN
  Filled 2019-06-06 (×2): qty 1

## 2019-06-06 NOTE — Progress Notes (Addendum)
      [address]       [place] [address]             [phone]      1 Day Post-Op Procedure(s) (LRB): ESOPHAGOGASTRODUODENOSCOPY (EGD) (Left) BIOPSY   Subjective:  Patient with no new complaints.  Still having issues with esophagus.  She states she can eat vanilla ice cream without too much difficulty, but anything acidic she isn't able to tolerate.  She states that most of her medicine to help this is being given after her meals.    Objective: Vital signs in last 24 hours: Temp:  [97.8 F (36.6 C)-98.5 F (36.9 C)] 98.4 F (36.9 C) (02/10 0250) Pulse Rate:  [80-101] 101 (02/10 0250) Cardiac Rhythm: Normal sinus rhythm (02/10 0300) Resp:  [16-25] 20 (02/10 0250) BP: (100-137)/(62-83) 108/80 (02/10 0250) SpO2:  [90 %-100 %] 90 % (02/10 0250)  Intake/Output from previous day: 02/09 0701 - 02/10 0700 In: 1722.7 [P.O.:100; I.V.:1622.7] Out: 320 [Urine:250; Chest Tube:70]  General appearance: alert, cooperative and no distress Heart: regular rate and rhythm Lungs: clear to auscultation bilaterally Abdomen: soft, non-tender; bowel sounds normal; no masses,  no organomegaly Extremities: extremities normal, atraumatic, no cyanosis or edema Wound: clean, some serous drainage from a port site along her back  Lab Results: Recent Labs    06/04/19 0306  WBC 10.4  HGB 10.1*  HCT 30.0*  PLT 263   BMET:  Recent Labs    06/04/19 0306 06/05/19 0415  NA 131* 131*  K 3.1* 3.2*  CL 92* 93*  CO2 27 27  GLUCOSE 95 93  BUN 5* <5*  CREATININE 0.40* 0.40*  CALCIUM 7.7* 7.8*    PT/INR: No results for input(s): LABPROT, INR in the last 72 hours. ABG    Component Value Date/Time   PHART 7.468 (H) 05/29/2019 0423   HCO3 39.2 (H) 05/29/2019 0423   TCO2 30 05/28/2019 1115   ACIDBASEDEF 1.0 05/28/2019 1115   O2SAT 69.3 05/29/2019 0423   CBG (last 3)  No results for input(s): GLUCAP in the last 72 hours.  Assessment/Plan: S/P Procedure(s)  (LRB): ESOPHAGOGASTRODUODENOSCOPY (EGD) (Left) BIOPSY  1. CV- hemodynamically stable, no acute issues 2. Pulm- no acute issues, persistent right apical space, chest tube with air leak but is improved, leave on water seal today 3. Esophagitis- per EGD yesterday, biopsies performed on ulcerations present, no bleeding present.. have instructed patient to ensure she takes magic mouthwash and carafate prior to meals possibly facilitate improvement of her symptoms and hopefully can increase oral intake 4. GI- patient requesting cholestyramine prn, order changed, continue to increase oral intake as tolerated 5. ID- port site has dehisced on back, serous drainage present, no evidence of infection, continue wound dressing changes as needed 6. Continue Lovenox for DVT prophylaxis 7. Dispo- patient stable, Esophagitis is biggest issues, appreciate GI assistance, continue supportive relief measures, increase oral intake as able, chest tube to water seal, repeat CXR    LOS: 9 days    Ellwood Handler, PA-C  06/06/2019 Patient seen and examined, agree with above Air leak is small and intermittent but still definitely present Dc fentanyl and use oxycodone Dc central line Needs to ambulate more  Remo Lipps C. Roxan Hockey, MD [facility] [phone]

## 2019-06-06 NOTE — Plan of Care (Signed)

## 2019-06-06 NOTE — Plan of Care (Signed)
°  Problem: Clinical Measurements: °Goal: Ability to maintain clinical measurements within normal limits will improve °Outcome: Progressing °Goal: Will remain free from infection °Outcome: Progressing °Goal: Diagnostic test results will improve °Outcome: Progressing °Goal: Respiratory complications will improve °Outcome: Progressing °  °

## 2019-06-06 NOTE — Progress Notes (Signed)
SLP Cancellation Note  Patient Details Name: RAYMA HEGG MRN: [id number] DOB: Sep 01, 1950   Cancelled treatment:       Reason Eval/Treat Not Completed: Patient at procedure or test/unavailable(pt speaking with medical staff in room, note results of endoscopy with esophagitis)   Macario Golds 06/06/2019, 9:24 AM  Kathleen Lime, MS [place] [facility] [phone]

## 2019-06-07 ENCOUNTER — Inpatient Hospital Stay (HOSPITAL_COMMUNITY): Payer: Medicare PPO

## 2019-06-07 ENCOUNTER — Other Ambulatory Visit: Payer: Self-pay | Admitting: *Deleted

## 2019-06-07 DIAGNOSIS — I2510 Atherosclerotic heart disease of native coronary artery without angina pectoris: Secondary | ICD-10-CM

## 2019-06-07 DIAGNOSIS — I472 Ventricular tachycardia: Secondary | ICD-10-CM

## 2019-06-07 DIAGNOSIS — I442 Atrioventricular block, complete: Secondary | ICD-10-CM

## 2019-06-07 LAB — BASIC METABOLIC PANEL
Anion gap: 12 (ref 5–15)
BUN: <5 mg/dL — ABNORMAL LOW (ref 8–23)
CO2: 38 mmol/L — ABNORMAL HIGH (ref 22–32)
Calcium: 8.9 mg/dL (ref 8.9–10.3)
Chloride: 82 mmol/L — ABNORMAL LOW (ref 98–111)
Creatinine, Ser: 0.48 mg/dL (ref 0.44–1.00)
GFR calc Af Amer: >60 mL/min (ref >60)
GFR calc non Af Amer: >60 mL/min (ref >60)
Glucose, Bld: 128 mg/dL — ABNORMAL HIGH (ref 70–99)
Potassium: 2.6 mmol/L — CL (ref 3.5–5.1)
Sodium: 132 mmol/L — ABNORMAL LOW (ref 135–145)

## 2019-06-07 LAB — TSH: TSH: 2.902 u[IU]/mL (ref 0.350–4.500)

## 2019-06-07 LAB — MAGNESIUM: Magnesium: 1.6 mg/dL — ABNORMAL LOW (ref 1.7–2.4)

## 2019-06-07 LAB — ECHOCARDIOGRAM COMPLETE
Height: 64 in
Weight: 1728 oz

## 2019-06-07 MED ORDER — POTASSIUM CHLORIDE 20 MEQ PO PACK
40.0000 meq | PACK | Freq: Two times a day (BID) | ORAL | Status: DC
  Filled 2019-06-07 (×2): qty 2

## 2019-06-07 MED ORDER — POTASSIUM CHLORIDE 20 MEQ PO PACK
80.0000 meq | PACK | Freq: Every day | ORAL | Status: DC
  Filled 2019-06-07: qty 4

## 2019-06-07 MED ORDER — TRAMADOL HCL 50 MG PO TABS
50.0000 mg | ORAL_TABLET | Freq: Four times a day (QID) | ORAL | Status: DC | PRN
  Administered 2019-06-07 – 2019-06-13 (×19): 50 mg via ORAL
  Filled 2019-06-07 (×20): qty 1

## 2019-06-07 MED ORDER — ALPRAZOLAM 0.25 MG PO TABS
0.2500 mg | ORAL_TABLET | Freq: Three times a day (TID) | ORAL | Status: DC | PRN
  Administered 2019-06-07 – 2019-06-12 (×5): 0.25 mg via ORAL
  Filled 2019-06-07 (×5): qty 1

## 2019-06-07 MED ORDER — OXYCODONE HCL 5 MG/5ML PO SOLN: 5.0000 mg | ORAL | Status: DC | PRN

## 2019-06-07 MED ORDER — POTASSIUM CHLORIDE CRYS ER 10 MEQ PO TBCR
40.0000 meq | EXTENDED_RELEASE_TABLET | Freq: Two times a day (BID) | ORAL | Status: AC
  Administered 2019-06-07 – 2019-06-08 (×2): 40 meq via ORAL
  Filled 2019-06-07 (×2): qty 4

## 2019-06-07 MED ORDER — CALCIUM CARBONATE ANTACID 500 MG PO CHEW
1.0000 | CHEWABLE_TABLET | Freq: Two times a day (BID) | ORAL | Status: AC
  Administered 2019-06-07 – 2019-06-09 (×6): 200 mg via ORAL
  Filled 2019-06-07 (×6): qty 1

## 2019-06-07 MED ORDER — POLYETHYLENE GLYCOL 3350 17 G PO PACK
17.0000 g | PACK | Freq: Every day | ORAL | Status: DC
  Filled 2019-06-07 (×4): qty 1

## 2019-06-07 MED ORDER — MAGNESIUM SULFATE 2 GM/50ML IV SOLN
2.0000 g | Freq: Once | INTRAVENOUS | Status: AC
  Administered 2019-06-07: 2 g via INTRAVENOUS
  Filled 2019-06-07: qty 50

## 2019-06-07 NOTE — Progress Notes (Addendum)
SLP Cancellation Note  Patient Details Name: [name] MRN: [id number] DOB: [birth year]   Cancelled treatment:       Reason Eval/Treat Not Completed: Patient at procedure or test/unavailable (Cardiology) ST will re-attempt later this date as schedule allows.  Addendum:  ST re-attempted 3:25pm, patient unavailable (ECHO).   Marina Goodell, M.Ed., CCC-SLP Speech Therapy Acute Rehabilitation [phone]: Acute Rehab office [phone] - pager    Marina Goodell 06/07/2019, 12:39 PM

## 2019-06-07 NOTE — Progress Notes (Signed)
The proposed treatment discussed in cancer conference 06/07/19 is for discussion purpose only and is not a binding recommendation.  The patient was not physically examined nor present for their treatment options.  Therefore, final treatment plans cannot be decided.  

## 2019-06-07 NOTE — Consult Note (Addendum)
Cardiology Consultation:   Patient ID: [name] MRN: [id number]; DOB: 07-30-1950  Admit date: 05/28/2019 Date of Consult: 06/07/2019  Primary Care Provider: Jani Gravel, MD Primary Cardiologist: Pixie Casino, MD  Primary Electrophysiologist:  None    Patient Profile:   [name] is a 69 y.o. female with a hx HTN, anxiety, HLD, tobacco use, COPD, stress test in 2017 that was low risk with EF 65%,  lymphomic colitis in 2018  who is being seen today for the evaluation of WCT at the request of Dr. Roxan Hockey.  History of Present Illness:   [name] is followed by Dr. Debara Pickett for the above cardiac problems. She has a history of difficult to control HTN and is currently taking telmisartan/HCTZ, amlodipine, and Toprol. She takes Crestor for HLD. She had a stress test in 2017 showing no ischemia and low risk test. She was seen in 03/2019 for shortness of breath by Dr. Debara Pickett. BP was stable at that time. EKG showed some T wave changes but she was asymptomatic so no testing was ordered. Dr. Debara Pickett noted some CT nodules that had been followed up on so a follow-up CT was ordered and lung cancer screening was performed. She subsequently referred to [facility]TT[facility] underwent PET screening.    [name] underwent robotic assisted thoroscopy along with wedge resection of the right upper lobe 05/28/19  for stage well differentiated adenocarcinoma measuring 1.6cm in size confined to the lungs. Chest tube was placed. Margins were negative and post-op was complicated by severe retrosternal burning with pain on swallowing and burning in her mouth. She underwent an endoscopy 06/05/19 and biopsies were taken of the ulcerations, no immediate complications. Patient is feeling better today. She is slowly increasing oral intake. This morning the patient was walking with her nurse when the patient began to feel felt off-balance and had to stop. The nurse noted that the heart rate jumped to 180 and then went back down. The  patient had to stop for a couple minutes before feeling good to move. The nurse brought a chair and the patient held onto the chair to walk back to the room. Patient denied feeling dizzy, lightheaded, chest pain, palpitations, or shortness of breath. On telemetry it shows Wide complex tachycardia, possibly VT, 22 beat run, 6 seconds. EKG showed NSR 89 bpm, nonspecific T wave abnormality V2 and V3 with Qtc 502 ms.   Labs 06/05/19: Sodium 131 Potassium 3.2 Chloride 93 Creatinine 0.40 WBC 10.4 Hgb 10.1   Heart Pathway Score:     Past Medical History:  Diagnosis Date  . Active smoker   . Anxiety   . Cervical dysplasia   . Complication of anesthesia    "I'm usually hard to wake up"  . Dyslipidemia   . Endometriosis   . History of nuclear stress test 07/07/2010   dipyridamole; low risk, post-stress EF 65%  . Hypertension   . Migraine   . Osteopenia    08/2006 and 08/2008 Dexa Scans w [name]   . Pneumonia    hx pneunomia ~2016  . PONV (postoperative nausea and vomiting)   . Sleep apnea    does not wear her CPAP because "it gave me respiratory issues (bronchities and pneumonia)"    Past Surgical History:  Procedure Laterality Date  . BACK SURGERY  2002   RUPTURED DISC   . CARDIAC CATHETERIZATION  11/20/2002   patent coronary arteries   . CHOLECYSTECTOMY  2006  . COLPOSCOPY    . GYNECOLOGIC CRYOSURGERY    .  INTERCOSTAL NERVE BLOCK Right 05/28/2019   Procedure: Intercostal Nerve Block;  Surgeon: Melrose Nakayama, MD;  Location: [place];  Service: Thoracic;  Laterality: Right;  . NODE DISSECTION  05/28/2019   Procedure: Node Dissection;  Surgeon: Melrose Nakayama, MD;  Location: [facility] OR;  Service: Thoracic;;  . NOSE SURGERY     X2  . OOPHORECTOMY     BSO  . SHOULDER SURGERY     ROTATOR CUFF X 2  . THORACOSCOPY  05/28/2019   THORASCOPY-WEDGE RESECTION AND  RIGHT  UPPER LOBECTOMY (Right)  . THROAT SURGERY     REMOVAL OF TUMOR  . TONSILLECTOMY    . VAGINAL HYSTERECTOMY   2000   WITH BSO     Home Medications:  Prior to Admission medications   Medication Sig Start Date End Date Taking? Authorizing Provider  amLODipine (NORVASC) 5 MG tablet Take 1 tablet (5 mg total) by mouth daily. 11/23/18  Yes Duke, Tami Lin, PA  aspirin EC 81 MG tablet Take 81 mg by mouth daily.   Yes [provider]  cholestyramine Lucrezia Starch) 4 GM/DOSE powder Take 4 g by mouth daily.    Yes [provider]  metoprolol succinate (TOPROL-XL) 25 MG 24 hr tablet Take 1 tablet (25 mg total) by mouth daily. Take with or immediately following a meal. 04/09/19  Yes Hilty, Nadean Corwin, MD  montelukast (SINGULAIR) 10 MG tablet Take 10 mg by mouth at bedtime.  02/07/17  Yes [provider]  rosuvastatin (CRESTOR) 10 MG tablet Take 1 tablet (10 mg total) by mouth daily. 04/23/19  Yes Hilty, Nadean Corwin, MD  telmisartan-hydrochlorothiazide (MICARDIS HCT) 80-12.5 MG tablet Take 1 tablet by mouth daily. 05/21/19  Yes Duke, Tami Lin, PA  temazepam (RESTORIL) 15 MG capsule Take 15 mg by mouth at bedtime.  10/02/18  Yes [provider]  VENTOLIN HFA 108 (90 BASE) MCG/ACT inhaler Inhale 2 puffs into the lungs every 6 (six) hours as needed for wheezing or shortness of breath.  12/13/12  Yes [provider]  Vitamin D, Ergocalciferol, (DRISDOL) 1.25 MG (50000 UT) CAPS capsule Take 50,000 Units by mouth once a week.  11/04/18  Yes [provider]  denosumab (PROLIA) 60 MG/ML SOLN injection Inject 60 mg into the skin every 6 (six) months. Administer in upper arm, thigh, or abdomen    [provider]    Inpatient Medications: Scheduled Meds: . amLODipine  5 mg Oral Daily  . aspirin EC  81 mg Oral Daily  . calcium carbonate  1 tablet Oral BID  . Chlorhexidine Gluconate Cloth  6 each Topical Daily  . enoxaparin (LOVENOX) injection  40 mg Subcutaneous Daily  . hydrochlorothiazide  12.5 mg Oral Daily  . metoprolol succinate  12.5 mg Oral Daily  .  montelukast  10 mg Oral QHS  . nicotine  21 mg Transdermal Daily  . pantoprazole  40 mg Oral BID  . polyethylene glycol  17 g Oral Daily  . rosuvastatin  10 mg Oral Daily  . senna-docusate  1 tablet Oral QHS  . sodium chloride flush  10-40 mL Intracatheter Q12H  . sucralfate  1 g Oral TID WC & HS   Continuous Infusions:  PRN Meds: albuterol, ALPRAZolam, alum & mag hydroxide-simeth, cholestyramine, diphenhydrAMINE, guaiFENesin, magic mouthwash, naloxone, ondansetron (ZOFRAN) IV, promethazine, sodium chloride flush, sodium chloride flush, traMADol  Allergies:    Allergies  Allergen Reactions  . Iodinated Diagnostic Agents Itching    itching but no hives just  after iv contrast injection w/ 13 hr prep, future contrast not advised unless absolutely necessary, 13 hr prep would still be advised.//a.calhoun  . Ioxaglate Itching    itching but no hives just after iv contrast injection w/ 13 hr prep, future contrast not advised unless absolutely necessary, 13 hr prep would still be advised.//a.calhoun  . Omnipaque [Iohexol] Anaphylaxis  . Buprenorphine Hcl Nausea And Vomiting  . Codeine Nausea And Vomiting  . Erythromycin   . Morphine And Related Nausea And Vomiting    Social History:   Social History   Socioeconomic History  . Marital status: Divorced    Spouse name: Not on file  . Number of children: Not on file  . Years of education: Not on file  . Highest education level: Not on file  Occupational History  . Not on file  Tobacco Use  . Smoking status: Current Every Day Smoker    Packs/day: 1.00    Years: 42.00    Pack years: 42.00    Types: Cigarettes  . Smokeless tobacco: Never Used  Substance and Sexual Activity  . Alcohol use: Yes    Alcohol/week: 10.0 standard drinks    Types: 10 Glasses of wine per week    Comment: ~10/week  . Drug use: No  . Sexual activity: Not Currently    Birth control/protection: Surgical    Comment: INTERCOUSRE AGE 62,SEXUAL PARTNERS MORE  THAN 5  Other Topics Concern  . Not on file  Social History Narrative  . Not on file   Social Determinants of Health   Financial Resource Strain:   . Difficulty of Paying Living Expenses: Not on file  Food Insecurity:   . Worried About Charity fundraiser in the Last Year: Not on file  . Ran Out of Food in the Last Year: Not on file  Transportation Needs:   . Lack of Transportation (Medical): Not on file  . Lack of Transportation (Non-Medical): Not on file  Physical Activity:   . Days of Exercise per Week: Not on file  . Minutes of Exercise per Session: Not on file  Stress:   . Feeling of Stress : Not on file  Social Connections:   . Frequency of Communication with Friends and Family: Not on file  . Frequency of Social Gatherings with Friends and Family: Not on file  . Attends Religious Services: Not on file  . Active Member of Clubs or Organizations: Not on file  . Attends Archivist Meetings: Not on file  . Marital Status: Not on file  Intimate Partner Violence:   . Fear of Current or Ex-Partner: Not on file  . Emotionally Abused: Not on file  . Physically Abused: Not on file  . Sexually Abused: Not on file    Family History:   Family History  Problem Relation Age of Onset  . Hypertension Mother   . Cancer Mother        LIVER AND LUNG  . Hypertension Brother   . Heart disease Brother   . Hypertension Maternal Grandfather   . Heart disease Maternal Grandfather   . Heart attack Maternal Grandfather      ROS:  Please see the history of present illness.  All other ROS reviewed and negative.     Physical Exam/Data:   Vitals:   06/06/19 1950 06/06/19 2328 06/07/19 0344 06/07/19 0706  BP: 96/68 114/76 119/79 119/70  Pulse: 75 83 84 79  Resp: 14 15 15 16   Temp: (!) 97.5  F (36.4 C) (!) 97.5 F (36.4 C) (!) 97.5 F (36.4 C) 97.6 F (36.4 C)  TempSrc: Oral Axillary Oral Oral  SpO2: 100% 98% 96% 96%  Weight:      Height:        Intake/Output  Summary (Last 24 hours) at 06/07/2019 1255 Last data filed at 06/07/2019 0800 Gross per 24 hour  Intake 100 ml  Output 325 ml  Net -225 ml   Last 3 Weights 05/28/2019 05/24/2019 05/09/2019  Weight (lbs) 108 lb 108 lb 106 lb  Weight (kg) 48.988 kg 48.988 kg 48.081 kg     Body mass index is 18.54 kg/m.  General:  Well nourished, well developed, in no acute distress HEENT: normal Lymph: no adenopathy Neck: no JVD Endocrine:  No thryomegaly Vascular: No carotid bruits; FA pulses 2+ bilaterally without bruits  Cardiac:  normal S1, S2; RRR; no murmur  Lungs:  +rales bilateraly Abd: soft, nontender, no hepatomegaly  Ext: no edema Musculoskeletal:  No deformities, BUE and BLE strength normal and equal Skin: warm and dry  Neuro:  CNs 2-12 intact, no focal abnormalities noted Psych:  Normal affect   EKG:  The EKG was personally reviewed and demonstrates:  NSR, 89 bpm, nonspecific T wave changes, Qtc 501 ms Telemetry:  Telemetry was personally reviewed and demonstrates:  NSR, Hr in the 80s, CHB last night with rates in the 40s, 22 beat run of VT this morning during ambulation  Relevant CV Studies:  Nuclear stress test 03/2016  The left ventricular ejection fraction is hyperdynamic (>65%).  Nuclear stress EF: 71%.  The study is normal.  This is a low risk study.  There was no ST segment deviation noted during stress.   Normal resting and stress perfusion. No ischemia or infarction EF 71% Baseline ECG with inferior lateral T wave inversions   Laboratory Data:  High Sensitivity Troponin:  No results for input(s): TROPONINIHS in the last 720 hours.   Chemistry Recent Labs  Lab 06/03/19 0415 06/04/19 0306 06/05/19 0415  NA 129* 131* 131*  K 3.7 3.1* 3.2*  CL 98 92* 93*  CO2 20* 27 27  GLUCOSE 62* 95 93  BUN 15 5* <5*  CREATININE 0.54 0.40* 0.40*  CALCIUM 7.3* 7.7* 7.8*  GFRNONAA >60 >60 >60  GFRAA >60 >60 >60  ANIONGAP 11 12 11     No results for input(s): PROT, ALBUMIN,  AST, ALT, ALKPHOS, BILITOT in the last 168 hours. Hematology Recent Labs  Lab 06/01/19 0500 06/03/19 0415 06/04/19 0306  WBC 5.5 11.5* 10.4  RBC 3.74* 3.11* 2.99*  HGB 12.6 10.4* 10.1*  HCT 38.0 31.8* 30.0*  MCV 101.6* 102.3* 100.3*  MCH 33.7 33.4 33.8  MCHC 33.2 32.7 33.7  RDW 12.8 13.2 13.0  PLT 247 253 263   BNPNo results for input(s): BNP, PROBNP in the last 168 hours.  DDimer No results for input(s): DDIMER in the last 168 hours.   Radiology/Studies:  DG Chest 1V REPEAT Same Day  Result Date: 06/07/2019 CLINICAL DATA:  Post lobectomy EXAM: CHEST - 1 VIEW SAME DAY COMPARISON:  Portable exam at 1233 hrs compared to 0629 hrs FINDINGS: Pigtail RIGHT thoracostomy tube again identified. Normal heart size, mediastinal contours, and pulmonary vascularity. Atherosclerotic calcification aorta. Postoperative changes with staple line and atelectasis in RIGHT perihilar region and RIGHT upper lobe. Persistent RIGHT apex pneumothorax little changed in size since prior study. At underlying emphysematous changes with mild atelectasis and effusion at LEFT base again seen. Bones demineralized. IMPRESSION:  Persistent RIGHT apex pneumothorax despite RIGHT thoracostomy tube. Changes of COPD with postoperative changes in the RIGHT upper lobe. Subsegmental atelectasis and small pleural effusion at LEFT base. Electronically Signed   By: Lavonia Dana [name]   On: 06/07/2019 12:49   DG CHEST PORT 1 VIEW  Result Date: 06/07/2019 CLINICAL DATA:  Follow-up right chest tube EXAM: PORTABLE CHEST 1 VIEW COMPARISON:  06/06/2019 FINDINGS: Cardiac shadow is stable. Right jugular central line is been removed in the interval. Right chest tube is again seen with persistent apical pneumothorax. Postsurgical changes in the right hilum are noted. No new focal abnormality on the right is seen. Minimal left pleural effusion is noted. IMPRESSION: No significant change from the prior exam. Electronically Signed   By: Inez Catalina  [name]   On: 06/07/2019 09:33   DG CHEST PORT 1 VIEW  Result Date: 06/06/2019 CLINICAL DATA:  Chest tube placement.  Sore chest. EXAM: PORTABLE CHEST 1 VIEW COMPARISON:  06/05/2019 FINDINGS: Right IJ central venous catheter unchanged. Right pleural pigtail drainage catheter unchanged. Moderate postsurgical changes over the right perihilar region and mid to upper lung with stable small to moderate size right apical pneumothorax. Minimal linear atelectasis left base. Possible tiny amount left pleural fluid improved. Cardiomediastinal silhouette and remainder of the exam is unchanged. IMPRESSION: 1. Postsurgical changes over the right perihilar region and mid to upper lung with stable small to moderate right apical pneumothorax. Right-sided chest tube unchanged. 2. Minimal linear atelectasis left base with tiny amount left pleural fluid slightly improved. Electronically Signed   By: Marin Olp [name]   On: 06/06/2019 09:45   DG CHEST PORT 1 VIEW  Result Date: 06/05/2019 CLINICAL DATA:  Chest tube EXAM: PORTABLE CHEST 1 VIEW COMPARISON:  Yesterday FINDINGS: Postoperative right lung with apical pneumothorax measuring 3.5 cm craniocaudal, stable. Stable chest tube positioning at the base. Right IJ catheter with tip at the upper cavoatrial junction. Streaky opacity at the left lung base. Emphysema. IMPRESSION: 1. Unchanged right apical pneumothorax. 2. Mild progression of left base atelectasis. Electronically Signed   By: Monte Fantasia [name]   On: 06/05/2019 09:30   DG CHEST PORT 1 VIEW  Result Date: 06/04/2019 CLINICAL DATA:  69 year old female postoperative day 7 right upper lobectomy for non-small cell carcinoma. EXAM: PORTABLE CHEST 1 VIEW COMPARISON:  Portable chest 06/03/2019 and earlier. FINDINGS: Portable AP semi upright view at 0513 hours. Stable pigtail type right chest tube. Stable right pneumothorax. Improved right lung base ventilation since yesterday. Stable right IJ central line. Stable cardiac  size and mediastinal contours. Larger left lung volume and mildly improved left lung base ventilation also. No new pulmonary opacity. IMPRESSION: 1. Stable right chest tube and right pneumothorax following right upper lobectomy. 2. Larger lung volumes and improved bibasilar ventilation. Electronically Signed   By: Genevie Ann [name]   On: 06/04/2019 06:04   {  Assessment and Plan:   Wide complex tachcyardia - consistent with NSVT. Patient had a 22 beat run (6 seconds) during ambulation this morning which made her feel off-balance.  - EKG showed NSR with nonspecific T wave changes - No prior echo>>will order - Stress test in 2017 was low risk with no ischemia. Patient denies chest pain - K 3.2 (last checked 2/9), will recheck and replete for goal >4 - check Mg, goal > 2  - check TSH - monitor for reoccurrence  Complete Heart Block - Telemetry last night showed complete heart block rate with rate 48 - Patient was  awake at that time in bed and was asymptomatic - Stop BB>> washout for 48 hours - consult to EP  HTN At home patient is on amlodipine 5 mg daily, toprol 25 mg daily, and telmisartan/HCTA 80-12.5mg  daily - BP previously soft and ARB was held  HLD - continue statin - no recent labs  For questions or updates, please contact [facility] Please consult [email or web address] for contact info under   Signed, Cadence Ninfa Meeker, PA-C  06/07/2019 12:55 PM   Patient seen and examined.  Agree with above documentation.  Ms. Sylvester is a 69 year old female with a history of COPD, hypertension, tobacco use who was found to have a right upper lobe lung nodule that was hypermetabolic on PET and underwent wedge resection/right upper lobectomy on 05/27/2018.  Postop course complicated by severe retrosternal burning and pain on swallowing.  She underwent endoscopy 06/05/2019 found to have esophagitis and gastric ulcerations, biopsies were taken.  This morning she was walking with a nurse and felt unsteady.   Nurse noted that heart rate was up to 180s.  Lasted a few seconds and came down to the 90s.  Patient was taken back to her room.  On telemetry she was noted to have a wide-complex tachycardia during episode, lasting a total of 22 beats (6 seconds).  Her telemetry is also notable for an episode of complete heart block overnight.  Lasted less than 1 minute.  Heart rate 48.  She was awake at that time, reported no symptoms.  Currently in sinus rhythm with rates 70-80s.  EKG shows normal sinus rhythm, rate 89, nonspecific T wave flattening.  On exam, patient is alert and oriented, regular rate and rhythm, no murmurs, lungs CTAB, no LE edema or JVD.  For her NSVT, suspect may be related to electrolyte abnormalities, as potassium 3.2 when last checked on 06/05/2019.  Would check BMET and magnesium today, and replete potassium for goal greater than 4 and magnesium for goal greater than 2.  Will check TTE to evaluate for structural heart disease.  For her complete heart block, would hold metoprolol.  Will consult EP.  Donato Heinz, MD

## 2019-06-07 NOTE — Consult Note (Addendum)
ELECTROPHYSIOLOGY CONSULT NOTE    Patient ID: [name] MRN: [id number], DOB/AGE: 1950/05/24 69 y.o.  Admit date: 05/28/2019 Date of Consult: 06/07/2019  Primary Physician: Jani Gravel, MD Primary Cardiologist: [facility] Electrophysiologist: Latora Quarry (new this admission)  Patient Profile: [name] is a 69 y.o. female with a history of HTN, tobacco abuse, COPD, lung nodule s/p lobectomy this admission who is being seen today for the evaluation of WCT and heart block at the request of Dr Gardiner Rhyme.  HPI:  [name] is a 69 y.o. female with the above past medical history.  She was admitted for planned lobectomy and her post op course has been complicated by gastric ulcers. This morning, she was walking with her nurse when she developed symptomatic wide complex tachycardia, duration 22 beats, likely VT. This spontaneously terminated.  Telemetry has also demonstrated non sustained high grade heart block that has been nocturnal and asymptomatic while awake.  Prior to admission, she has not had palpitations, syncope, pre-syncope, or dizziness.  She has been managed by Dr Debara Pickett for hypertension.  Chest CT pre VATS showed advanced atherosclerotic CAD in 3V territory.  Last myoview 2017 without ischemic changes.    EKG this admission with prolonged QT interval, chronic for patient.  Also with chronic STT changes.     Echo and updated labs have been ordered but not resulted yet.    Past Medical History:  Diagnosis Date  . Active smoker   . Anxiety   . Cervical dysplasia   . Complication of anesthesia    "I'm usually hard to wake up"  . Dyslipidemia   . Endometriosis   . History of nuclear stress test 07/07/2010   dipyridamole; low risk, post-stress EF 65%  . Hypertension   . Migraine   . Osteopenia    08/2006 and 08/2008 Dexa Scans w [facility] Blakes   . Pneumonia    hx pneunomia ~2016  . PONV (postoperative nausea and vomiting)   . Sleep apnea    does not wear her CPAP because "it  gave me respiratory issues (bronchities and pneumonia)"     Surgical History:  Past Surgical History:  Procedure Laterality Date  . BACK SURGERY  2002   RUPTURED DISC   . CARDIAC CATHETERIZATION  11/20/2002   patent coronary arteries   . CHOLECYSTECTOMY  2006  . COLPOSCOPY    . GYNECOLOGIC CRYOSURGERY    . INTERCOSTAL NERVE BLOCK Right 05/28/2019   Procedure: Intercostal Nerve Block;  Surgeon: Melrose Nakayama, MD;  Location: [place];  Service: Thoracic;  Laterality: Right;  . NODE DISSECTION  05/28/2019   Procedure: Node Dissection;  Surgeon: Melrose Nakayama, MD;  Location: [facility];  Service: Thoracic;;  . NOSE SURGERY     X2  . OOPHORECTOMY     BSO  . SHOULDER SURGERY     ROTATOR CUFF X 2  . THORACOSCOPY  05/28/2019   THORASCOPY-WEDGE RESECTION AND  RIGHT  UPPER LOBECTOMY (Right)  . THROAT SURGERY     REMOVAL OF TUMOR  . TONSILLECTOMY    . VAGINAL HYSTERECTOMY  2000   WITH BSO     Medications Prior to Admission  Medication Sig Dispense Refill Last Dose  . amLODipine (NORVASC) 5 MG tablet Take 1 tablet (5 mg total) by mouth daily. 90 tablet 1 05/28/2019 at 0445  . aspirin EC 81 MG tablet Take 81 mg by mouth daily.   Past Week at Unknown time  . cholestyramine (QUESTRAN) 4 GM/DOSE powder  Take 4 g by mouth daily.    05/27/2019 at Unknown time  . metoprolol succinate (TOPROL-XL) 25 MG 24 hr tablet Take 1 tablet (25 mg total) by mouth daily. Take with or immediately following a meal. 90 tablet 3   . montelukast (SINGULAIR) 10 MG tablet Take 10 mg by mouth at bedtime.   0 05/27/2019 at Unknown time  . rosuvastatin (CRESTOR) 10 MG tablet Take 1 tablet (10 mg total) by mouth daily. 90 tablet 1 05/28/2019 at 0445  . telmisartan-hydrochlorothiazide (MICARDIS HCT) 80-12.5 MG tablet Take 1 tablet by mouth daily. 90 tablet 1 05/27/2019 at Unknown time  . temazepam (RESTORIL) 15 MG capsule Take 15 mg by mouth at bedtime.    05/27/2019 at Unknown time  . VENTOLIN HFA 108 (90 BASE) MCG/ACT  inhaler Inhale 2 puffs into the lungs every 6 (six) hours as needed for wheezing or shortness of breath.    05/28/2019 at 0445  . Vitamin D, Ergocalciferol, (DRISDOL) 1.25 MG (50000 UT) CAPS capsule Take 50,000 Units by mouth once a week.    Past Month at Unknown time  . denosumab (PROLIA) 60 MG/ML SOLN injection Inject 60 mg into the skin every 6 (six) months. Administer in upper arm, thigh, or abdomen   More than a month at Unknown time    Inpatient Medications:  . amLODipine  5 mg Oral Daily  . aspirin EC  81 mg Oral Daily  . calcium carbonate  1 tablet Oral BID  . Chlorhexidine Gluconate Cloth  6 each Topical Daily  . enoxaparin (LOVENOX) injection  40 mg Subcutaneous Daily  . hydrochlorothiazide  12.5 mg Oral Daily  . montelukast  10 mg Oral QHS  . nicotine  21 mg Transdermal Daily  . pantoprazole  40 mg Oral BID  . polyethylene glycol  17 g Oral Daily  . rosuvastatin  10 mg Oral Daily  . senna-docusate  1 tablet Oral QHS  . sodium chloride flush  10-40 mL Intracatheter Q12H  . sucralfate  1 g Oral TID WC & HS    Allergies:  Allergies  Allergen Reactions  . Iodinated Diagnostic Agents Itching    itching but no hives just after iv contrast injection w/ 13 hr prep, future contrast not advised unless absolutely necessary, 13 hr prep would still be advised.//a.calhoun  . Ioxaglate Itching    itching but no hives just after iv contrast injection w/ 13 hr prep, future contrast not advised unless absolutely necessary, 13 hr prep would still be advised.//a.calhoun  . Omnipaque [Iohexol] Anaphylaxis  . Buprenorphine Hcl Nausea And Vomiting  . Codeine Nausea And Vomiting  . Erythromycin   . Morphine And Related Nausea And Vomiting    Social History   Socioeconomic History  . Marital status: Divorced    Spouse name: Not on file  . Number of children: Not on file  . Years of education: Not on file  . Highest education level: Not on file  Occupational History  . Not on file   Tobacco Use  . Smoking status: Current Every Day Smoker    Packs/day: 1.00    Years: 42.00    Pack years: 42.00    Types: Cigarettes  . Smokeless tobacco: Never Used  Substance and Sexual Activity  . Alcohol use: Yes    Alcohol/week: 10.0 standard drinks    Types: 10 Glasses of wine per week    Comment: ~10/week  . Drug use: No  . Sexual activity: Not Currently  Birth control/protection: Surgical    Comment: INTERCOUSRE AGE 61,SEXUAL PARTNERS MORE THAN 5  Other Topics Concern  . Not on file  Social History Narrative  . Not on file   Social Determinants of Health   Financial Resource Strain:   . Difficulty of Paying Living Expenses: Not on file  Food Insecurity:   . Worried About Charity fundraiser in the Last Year: Not on file  . Ran Out of Food in the Last Year: Not on file  Transportation Needs:   . Lack of Transportation (Medical): Not on file  . Lack of Transportation (Non-Medical): Not on file  Physical Activity:   . Days of Exercise per Week: Not on file  . Minutes of Exercise per Session: Not on file  Stress:   . Feeling of Stress : Not on file  Social Connections:   . Frequency of Communication with Friends and Family: Not on file  . Frequency of Social Gatherings with Friends and Family: Not on file  . Attends Religious Services: Not on file  . Active Member of Clubs or Organizations: Not on file  . Attends Archivist Meetings: Not on file  . Marital Status: Not on file  Intimate Partner Violence:   . Fear of Current or Ex-Partner: Not on file  . Emotionally Abused: Not on file  . Physically Abused: Not on file  . Sexually Abused: Not on file     Family History  Problem Relation Age of Onset  . Hypertension Mother   . Cancer Mother        LIVER AND LUNG  . Hypertension Brother   . Heart disease Brother   . Hypertension Maternal Grandfather   . Heart disease Maternal Grandfather   . Heart attack Maternal Grandfather      Review of  Systems: All other systems reviewed and are otherwise negative except as noted above.  Physical Exam: Vitals:   06/06/19 1950 06/06/19 2328 06/07/19 0344 06/07/19 0706  BP: 96/68 114/76 119/79 119/70  Pulse: 75 83 84 79  Resp: 14 15 15 16   Temp: (!) 97.5 F (36.4 C) (!) 97.5 F (36.4 C) (!) 97.5 F (36.4 C) 97.6 F (36.4 C)  TempSrc: Oral Axillary Oral Oral  SpO2: 100% 98% 96% 96%  Weight:      Height:        GEN- The patient is well appearing, alert and oriented x 3 today.   HEENT: normocephalic, atraumatic; sclera clear, conjunctiva pink; hearing intact; oropharynx clear; neck supple Lungs- Clear to ausculation bilaterally, normal work of breathing.  No wheezes, rales, rhonchi Heart- Regular rate and rhythm  GI- soft, non-tender, non-distended, bowel sounds present Extremities- no clubbing, cyanosis, or edema; DP/PT/radial pulses 2+ bilaterally MS- no significant deformity or atrophy Skin- warm and dry, no rash or lesion Psych- euthymic mood, full affect Neuro- strength and sensation are intact  Labs:   Lab Results  Component Value Date   WBC 10.4 06/04/2019   HGB 10.1 (L) 06/04/2019   HCT 30.0 (L) 06/04/2019   MCV 100.3 (H) 06/04/2019   PLT 263 06/04/2019    Recent Labs  Lab 06/05/19 0415  NA 131*  K 3.2*  CL 93*  CO2 27  BUN <5*  CREATININE 0.40*  CALCIUM 7.8*  GLUCOSE 93      Radiology/Studies: DG Chest 2 View  Result Date: 05/24/2019 CLINICAL DATA:  Pre-op exam for nodule of upper lobe of Right lung. Pt denies any chest complaints such  as cough or SOB. Pt is a current smoker. Pt hx HTN. Pt was tested today for COVID EXAM: CHEST - 2 VIEW COMPARISON:  06/28/2010 FINDINGS: Cardiac silhouette is normal in size and configuration. No mediastinal or hilar masses. There is no evidence of adenopathy. Lungs are hyperexpanded. Mild interstitial thickening noted in the peripheral bases. Relative lucency noted in the upper lobes consistent with emphysema. Vague  opacity noted in the peripheral right upper lobe corresponds to the pleural based irregular nodule noted on the prior CT from 04/17/2019. Lungs otherwise clear. No pleural effusion or pneumothorax. Skeletal structures are intact. IMPRESSION: 1. No acute cardiopulmonary disease. 2. COPD. Pleural based right upper lobe nodule noted on the prior CT is vaguely seen radiographically. Electronically Signed   By: Lajean Manes [name]   On: 05/24/2019 11:42   DG Abd 1 View  Result Date: 06/03/2019 CLINICAL DATA:  69 year old female postoperative day 6 from right lung VATS. Ileus. EXAM: ABDOMEN - 1 VIEW COMPARISON:  Portable abdomen 06/02/2019 and earlier. FINDINGS: Portable AP supine view at 0428 hours. A pelvic tube, probably a rectal tube, is now in place. Mildly improved bowel gas pattern since yesterday, although there remain mildly dilated gas-filled small bowel loops in the mid abdomen. There is large bowel gas, to the descending colon. Stable cholecystectomy clips. No enteric tube identified. Extensive Aortoiliac calcified atherosclerosis. No acute osseous abnormality identified. IMPRESSION: 1. Improved but not resolved ileus gas pattern since yesterday. 2.  Aortic Atherosclerosis (ICD10-I70.0). Electronically Signed   By: Genevie Ann [name]   On: 06/03/2019 06:40   DG Abd 1 View  Result Date: 05/31/2019 CLINICAL DATA:  Abdominal pain and distension, postop from right upper lobe wedge resection 05/28/2019 EXAM: ABDOMEN - 1 VIEW COMPARISON:  10/11/2016 FINDINGS: 2 supine frontal views of the abdomen and pelvis demonstrate distended gas-filled loops of small bowel within the right mid abdomen measuring up to 3.7 cm in diameter. There is a paucity of colonic gas. There is elevation of the right hemidiaphragm. Otherwise lung bases are clear. IMPRESSION: 1. Nonspecific gaseous distention of the small bowel, which likely reflect postoperative ileus. Serial radiographic follow-up recommended. 2. Elevated right hemidiaphragm.  Electronically Signed   By: Randa Ngo [name]   On: 05/31/2019 08:52   DG Chest 1V REPEAT Same Day  Result Date: 06/07/2019 CLINICAL DATA:  Post lobectomy EXAM: CHEST - 1 VIEW SAME DAY COMPARISON:  Portable exam at 1233 hrs compared to 0629 hrs FINDINGS: Pigtail RIGHT thoracostomy tube again identified. Normal heart size, mediastinal contours, and pulmonary vascularity. Atherosclerotic calcification aorta. Postoperative changes with staple line and atelectasis in RIGHT perihilar region and RIGHT upper lobe. Persistent RIGHT apex pneumothorax little changed in size since prior study. At underlying emphysematous changes with mild atelectasis and effusion at LEFT base again seen. Bones demineralized. IMPRESSION: Persistent RIGHT apex pneumothorax despite RIGHT thoracostomy tube. Changes of COPD with postoperative changes in the RIGHT upper lobe. Subsegmental atelectasis and small pleural effusion at LEFT base. Electronically Signed   By: Lavonia Dana [name]   On: 06/07/2019 12:49   DG CHEST PORT 1 VIEW  Result Date: 06/07/2019 CLINICAL DATA:  Follow-up right chest tube EXAM: PORTABLE CHEST 1 VIEW COMPARISON:  06/06/2019 FINDINGS: Cardiac shadow is stable. Right jugular central line is been removed in the interval. Right chest tube is again seen with persistent apical pneumothorax. Postsurgical changes in the right hilum are noted. No new focal abnormality on the right is seen. Minimal left pleural effusion is noted. IMPRESSION:  No significant change from the prior exam. Electronically Signed   By: Inez Catalina [name]   On: 06/07/2019 09:33   DG CHEST PORT 1 VIEW  Result Date: 06/06/2019 CLINICAL DATA:  Chest tube placement.  Sore chest. EXAM: PORTABLE CHEST 1 VIEW COMPARISON:  06/05/2019 FINDINGS: Right IJ central venous catheter unchanged. Right pleural pigtail drainage catheter unchanged. Moderate postsurgical changes over the right perihilar region and mid to upper lung with stable small to moderate size  right apical pneumothorax. Minimal linear atelectasis left base. Possible tiny amount left pleural fluid improved. Cardiomediastinal silhouette and remainder of the exam is unchanged. IMPRESSION: 1. Postsurgical changes over the right perihilar region and mid to upper lung with stable small to moderate right apical pneumothorax. Right-sided chest tube unchanged. 2. Minimal linear atelectasis left base with tiny amount left pleural fluid slightly improved. Electronically Signed   By: Marin Olp [name]   On: 06/06/2019 09:45   DG CHEST PORT 1 VIEW  Result Date: 06/05/2019 CLINICAL DATA:  Chest tube EXAM: PORTABLE CHEST 1 VIEW COMPARISON:  Yesterday FINDINGS: Postoperative right lung with apical pneumothorax measuring 3.5 cm craniocaudal, stable. Stable chest tube positioning at the base. Right IJ catheter with tip at the upper cavoatrial junction. Streaky opacity at the left lung base. Emphysema. IMPRESSION: 1. Unchanged right apical pneumothorax. 2. Mild progression of left base atelectasis. Electronically Signed   By: Monte Fantasia [name]   On: 06/05/2019 09:30   DG CHEST PORT 1 VIEW  Result Date: 06/04/2019 CLINICAL DATA:  69 year old female postoperative day 7 right upper lobectomy for non-small cell carcinoma. EXAM: PORTABLE CHEST 1 VIEW COMPARISON:  Portable chest 06/03/2019 and earlier. FINDINGS: Portable AP semi upright view at 0513 hours. Stable pigtail type right chest tube. Stable right pneumothorax. Improved right lung base ventilation since yesterday. Stable right IJ central line. Stable cardiac size and mediastinal contours. Larger left lung volume and mildly improved left lung base ventilation also. No new pulmonary opacity. IMPRESSION: 1. Stable right chest tube and right pneumothorax following right upper lobectomy. 2. Larger lung volumes and improved bibasilar ventilation. Electronically Signed   By: Genevie Ann [name]   On: 06/04/2019 06:04   DG CHEST PORT 1 VIEW  Result Date:  06/03/2019 CLINICAL DATA:  69 year old female status post right lung VATS, right upper lobectomy postoperative day 6. Right chest tubes. EXAM: PORTABLE CHEST 1 VIEW COMPARISON:  06/02/2019 portable chest and earlier. FINDINGS: Portable AP semi upright view at 0427 hours. The large caliber right chest tube has been removed. The smaller pigtail type chest tube remains in place. Stable mild to moderate sized right apical pneumothorax since yesterday, pleural edge is visible to the right lateral 3/4 rib interface. Other vertical tubes continue to project over the right chest near midline of unclear significance. Improved right lung volume since yesterday. Stable cardiac size and mediastinal contours. Crowding of chronic pulmonary interstitial markings. No definite edema or pleural effusion. Negative visible bowel gas pattern. Stable visualized osseous structures. IMPRESSION: 1. One right chest tube removed, one remains. Stable right pneumothorax since yesterday. 2. Improved right lung volume. No new cardiopulmonary abnormality. Electronically Signed   By: Genevie Ann [name]   On: 06/03/2019 06:38   DG CHEST PORT 1 VIEW  Result Date: 06/02/2019 CLINICAL DATA:  Pneumothorax. EXAM: PORTABLE CHEST 1 VIEW COMPARISON:  June 01, 2019. FINDINGS: Stable cardiomediastinal silhouette. Stable position of 2 right-sided chest tubes. Mild right apical pneumothorax is noted which is decreased compared to prior exam. Right internal  jugular catheter is unchanged. Mild bibasilar subsegmental atelectasis is noted. Bony thorax is unremarkable. IMPRESSION: Stable position of 2 right-sided chest tubes. Mild right apical pneumothorax is noted which is decreased compared to prior exam. Electronically Signed   By: Marijo Conception [name]   On: 06/02/2019 09:51   DG CHEST PORT 1 VIEW  Result Date: 06/01/2019 CLINICAL DATA:  Pneumothorax, chest tube EXAM: PORTABLE CHEST 1 VIEW COMPARISON:  Portable exam 0633 hours compared to 05/31/2019 FINDINGS:  RIGHT jugular line tip projecting over SVC. Two RIGHT thoracostomy tubes unchanged. Persistent large RIGHT pneumothorax. Postsurgical changes with atelectasis at mid inferior RIGHT lung. Mild atelectasis versus infiltrate at LEFT base slightly increased. Remaining LEFT lung clear. Bones demineralized. Atherosclerotic calcification aorta. IMPRESSION: Persistent large RIGHT pneumothorax despite thoracostomy tube with postsurgical changes at inferior RIGHT lung. Slightly increased atelectasis versus infiltrate at LEFT base. Electronically Signed   By: Lavonia Dana [name]   On: 06/01/2019 08:42   DG CHEST PORT 1 VIEW  Result Date: 05/31/2019 CLINICAL DATA:  69 year old female with right upper lobe lung nodule, status post VATS, right upper lobectomy, lymph no dissection EXAM: PORTABLE CHEST 1 VIEW COMPARISON:  05/30/2019, 05/29/2019 FINDINGS: Cardiomediastinal silhouette unchanged in size and contour with postsurgical right chest. Similar size of the residual right-sided pneumothorax with surgical changes at the apex of the lung and in the right suprahilar region. Opacity in the right hilum persists. Unchanged large bore thoracostomy tube terminating at the apex. Unchanged pigtail thoracostomy tube terminating over the hilum. Unchanged right IJ central venous catheter. Left lung with interstitial opacities and blunting of the left costophrenic angle, unchanged. No new airspace disease. IMPRESSION: Early postsurgical changes of the right chest, with unchanged thoracostomy tubes and right pneumothorax. Unchanged right IJ central venous catheter. Electronically Signed   By: Corrie Mckusick D.O.   On: 05/31/2019 08:45   DG CHEST PORT 1 VIEW  Result Date: 05/30/2019 CLINICAL DATA:  Chest pain.  Follow-up chest tube. EXAM: PORTABLE CHEST 1 VIEW COMPARISON:  Multiple priors, including yesterday FINDINGS: Right internal jugular line tip at low SVC. 2 right chest tubes are unchanged in position. Normal heart size.  Atherosclerosis in the transverse aorta. No pleural fluid. Moderate right-sided pneumothorax identified superiorly. Felt to be slightly increased compared to the prior. Clear left lung. Right perihilar airspace disease likely is due to postoperative atelectasis. IMPRESSION: Right chest tubes in place with moderate right-sided pneumothorax. Aortic Atherosclerosis (ICD10-I70.0). Electronically Signed   By: Abigail Miyamoto [name]   On: 05/30/2019 09:09   DG CHEST PORT 1 VIEW  Result Date: 05/29/2019 CLINICAL DATA:  Prior lobectomy.  Chest tube. EXAM: PORTABLE CHEST 1 VIEW COMPARISON:  05/28/2019. FINDINGS: Prior right upper lobectomy. Two right chest tubes are noted. Lucency over the right apex again noted most consistent with pneumothorax. Similar findings noted on prior exam. Right IJ line stable position. Heart size normal. Left base subsegmental atelectasis. No pleural effusion. Carotid vascular calcification. Calcifications noted over both breast. Correlation with mammography suggested. IMPRESSION: 1. Prior right upper lobectomy. Two right chest tubes are again noted. Lucency over the right apex again noted and most consistent with a pneumothorax. Similar findings noted on prior exam. Right IJ line stable position. 2.  Mild left base subsegmental atelectasis. 3. Calcifications noted over both breast. Correlation with mammography suggested. Electronically Signed   By: Marcello Moores  Register   On: 05/29/2019 08:45   DG Chest Port 1 View  Result Date: 05/28/2019 CLINICAL DATA:  Status post lobectomy. EXAM: PORTABLE  CHEST 1 VIEW COMPARISON:  May 24, 2019. FINDINGS: Stable cardiomediastinal silhouette. Atherosclerosis of thoracic aorta is noted. Left lung is clear. Postsurgical changes are noted in the right upper lobe consistent with right upper lobectomy. Two chest tubes are noted. There is lucency seen in the right upper lobe, and it is uncertain if this represents emphysematous disease or possibly pneumothorax. No  pleural effusion is noted. Bony thorax is unremarkable. IMPRESSION: Postsurgical changes are noted in the right upper lobe consistent with right upper lobectomy. Two chest tubes are noted. Lucency is seen in right upper lobe, and it is uncertain if this represents emphysematous disease or possibly pneumothorax. Aortic Atherosclerosis (ICD10-I70.0). Electronically Signed   By: Marijo Conception [name]   On: 05/28/2019 14:04   DG Abd Portable 1V  Result Date: 06/02/2019 CLINICAL DATA:  Ileus. EXAM: PORTABLE ABDOMEN - 1 VIEW COMPARISON:  June 01, 2019. FINDINGS: Status post cholecystectomy. Stable mildly dilated small bowel loops are noted in the central portion of the abdomen concerning for ileus or less likely obstruction. Vascular calcifications are noted. IMPRESSION: Stable dilated small bowel loops are noted most consistent with ileus or possibly distal small bowel obstruction. Continued radiographic follow-up is recommended. Electronically Signed   By: Marijo Conception [name]   On: 06/02/2019 09:52   DG Abd Portable 1V  Result Date: 06/01/2019 CLINICAL DATA:  Nausea, vomiting, chest tube, pneumothorax EXAM: PORTABLE ABDOMEN - 1 VIEW COMPARISON:  Portable exam 0636 hours compared to 05/31/2019 FINDINGS: Persistent gaseous distension of small bowel loops in the mid abdomen. No definite bowel wall thickening. Paucity of colonic gas. Bones demineralized with mild degenerative changes of the lumbar spine. Atherosclerotic calcifications of the aorta and iliac arteries. IMPRESSION: Persistent small bowel dilatation. Electronically Signed   By: Lavonia Dana [name]   On: 06/01/2019 08:35    IOE:VOJJK rhythm, rate 89, non specific T wave changes, prolonged QT interval (personally reviewed)  TELEMETRY: sinus rhythm, 22 beats VT, non sustained nocturnal high grade heart block (personally reviewed)   Assessment/Plan: 1.  Wide complex tachycardia Concerning for VT on telemetry Await echo results Resume low dose  Metoprolol Would strongly consider invasive ischemic evaluation - Dr Rayann Heman discussed with Dr Gardiner Rhyme today  2.  Transient complete heart block with junctional escape No symptoms of bradycardia prior to admission Would follow clinically  3.  Prolonged QT interval Avoid QT prolonging meds    For questions or updates, please contact [place] Please consult [email or web address] for contact info under Cardiology/STEMI.  Signed, Chanetta Marshall, NP 06/07/2019 3:47 PM  I have seen, examined the patient, and reviewed the above assessment and plan.  Changes to above are made where necessary.  On exam, RRR.  Ill appearing.  I have spoken with patient at length as well as Dr Gardiner Rhyme.  The patient has had nonsustained VT this admission with palpitations.  She has also had transient complete heart block with junctional/ narrow escape and no symptoms.  She has a h/o multiple family members with early CAD,  She has smoked for years and has had htn since her 54s.  She has advanced 3v CAD on recent CT 04/16/2020 which Dr Gardiner Rhyme and I have reviewed together.  I worry that her arrhythmias may be ischemic in nature.  Echo is pending.  I would advise ischemic workup with cath.  The patient is currently not interested.  She is not a candidate for EP procedures at this time.  Resume low dose beta blocker and follow-up.  Electrophysiology team to see as needed while here. Please call with questions.   Co Sign: Thompson Grayer, MD

## 2019-06-07 NOTE — Progress Notes (Signed)
CRITICAL VALUE ALERT  Critical Value:  Potassium 2.6  Date & Time Notied:  06/07/2019, 1800  Provider Notified: Ellwood Handler  Orders Received/Actions taken: Verbal order for 80 mEq liquid K+ 1 time.

## 2019-06-07 NOTE — Plan of Care (Signed)

## 2019-06-07 NOTE — Progress Notes (Addendum)
      [address]       [place] [address]             [phone]      2 Days Post-Op Procedure(s) (LRB): ESOPHAGOGASTRODUODENOSCOPY (EGD) (Left) BIOPSY   Subjective:  No new complaints.  States she is feeling better today.  She does have some pain at chest tube site.  She was able to eat some yesterday, states if the food caused her discomfort she just stopped eating that particular item.   Objective: Vital signs in last 24 hours: Temp:  [97.5 F (36.4 C)-99 F (37.2 C)] 97.5 F (36.4 C) (02/11 0344) Pulse Rate:  [75-84] 84 (02/11 0344) Cardiac Rhythm: Normal sinus rhythm (02/11 0400) Resp:  [14-19] 15 (02/11 0344) BP: (96-119)/(68-79) 119/79 (02/11 0344) SpO2:  [96 %-100 %] 96 % (02/11 0344)  Intake/Output from previous day: 02/10 0701 - 02/11 0700 In: 240 [P.O.:240] Out: 1515 [Urine:1475; Chest Tube:40]  General appearance: alert, cooperative and no distress Heart: regular rate and rhythm Lungs: clear to auscultation bilaterally Abdomen: soft, non-tender; bowel sounds normal; no masses,  no organomegaly Extremities: extremities normal, atraumatic, no cyanosis or edema Wound: clean, serous drainage from port site, improving  Lab Results: No results for input(s): WBC, HGB, HCT, PLT in the last 72 hours. BMET:  Recent Labs    06/05/19 0415  NA 131*  K 3.2*  CL 93*  CO2 27  GLUCOSE 93  BUN <5*  CREATININE 0.40*  CALCIUM 7.8*    PT/INR: No results for input(s): LABPROT, INR in the last 72 hours. ABG    Component Value Date/Time   PHART 7.468 (H) 05/29/2019 0423   HCO3 39.2 (H) 05/29/2019 0423   TCO2 30 05/28/2019 1115   ACIDBASEDEF 1.0 05/28/2019 1115   O2SAT 69.3 05/29/2019 0423   CBG (last 3)  No results for input(s): GLUCAP in the last 72 hours.  Assessment/Plan: S/P Procedure(s) (LRB): ESOPHAGOGASTRODUODENOSCOPY (EGD) (Left) BIOPSY  1. CV- hemodynamically stable 2. Pulm- CT w/o air leak this morning, CXR looks stable, can  hopefully d/c chest tube today 3. Esophagitis- stable, oral intake improved, continue Carafate, magic mouthwash.. increase oral intake as able 3. GI- no further diarrhea 4. ID- port site on back continues to have serous drainage, this has slowed down, no evidence of infection, continue dressing changes as needed 5. Lovenox for DVT prophylaxis 6. Dispo- patient feeling better today, able to tolerate some oral intake yesterday, can hopefully d/c chest tube today, if patient remains clinically stable possibly ready for d/c in next 24-48 hours   LOS: 10 days    Ellwood Handler, PA-C  06/07/2019 Patient seen and examined, complains of falling asleep while eating. Pain a little better, no BM in a few days. I don't see an air leak but there is air audible in tubing. CXR unchanged apical space. Will try clamping tube and repeat an X ray in 2 hours- if stable and no leak when unclamped will dc tube She is a little overmedicated this AM- will decrease Xanax and oxycodone  Remo Lipps C. Roxan Hockey, MD [facility] [phone]

## 2019-06-07 NOTE — Progress Notes (Signed)
CRITICAL VALUE ALERT  Critical Value:  Potassium 2.6  Date & Time Notied:  06/07/2019, 1720  Provider Notified: Dr. Percival Spanish  Orders Received/Actions taken: Awaiting orders

## 2019-06-07 NOTE — Progress Notes (Signed)
Ambulated with patient on the unit today, patient having pain in her neck stating that "she can't lift her head and walking and looking at the floor was making her dizzy". About 50 ft into walk patient tele box rang out and patients HR was in the 180's, this was nonsustained and HR went back down to the 90's after a few seconds. RN asked patient if she could feel that her HR was elevated and patient stated that she "feels like I am wobbling". RN stopped NT to grab a chair so that patient could sit. RN and NT then proceeded to roll patient back to her room for safety reasons. We stopped at the door threshold and patient ambulated into the room with the assistance of a front wheel walker. Dr. Roxan Hockey was at patients room at the time and EKG was ordered and cardiology consulted.

## 2019-06-07 NOTE — Progress Notes (Signed)
  Echocardiogram 2D Echocardiogram has been performed.  [name] 06/07/2019, 4:01 PM

## 2019-06-08 ENCOUNTER — Encounter (HOSPITAL_COMMUNITY): Payer: Self-pay | Admitting: Thoracic Surgery (Cardiothoracic Vascular Surgery)

## 2019-06-08 ENCOUNTER — Inpatient Hospital Stay (HOSPITAL_COMMUNITY): Payer: Medicare PPO

## 2019-06-08 ENCOUNTER — Inpatient Hospital Stay: Payer: Self-pay

## 2019-06-08 DIAGNOSIS — R9431 Abnormal electrocardiogram [ECG] [EKG]: Secondary | ICD-10-CM

## 2019-06-08 LAB — TYPE AND SCREEN
ABO/RH(D): O POS
Antibody Screen: NEGATIVE
Unit division: 0
Unit division: 0

## 2019-06-08 LAB — BASIC METABOLIC PANEL
Anion gap: 10 (ref 5–15)
Anion gap: 7 (ref 5–15)
BUN: <5 mg/dL — ABNORMAL LOW (ref 8–23)
BUN: <5 mg/dL — ABNORMAL LOW (ref 8–23)
CO2: 41 mmol/L — ABNORMAL HIGH (ref 22–32)
CO2: 38 mmol/L — ABNORMAL HIGH (ref 22–32)
Calcium: 8.9 mg/dL (ref 8.9–10.3)
Calcium: 8.6 mg/dL — ABNORMAL LOW (ref 8.9–10.3)
Chloride: 79 mmol/L — ABNORMAL LOW (ref 98–111)
Chloride: 86 mmol/L — ABNORMAL LOW (ref 98–111)
Creatinine, Ser: 0.45 mg/dL (ref 0.44–1.00)
Creatinine, Ser: 0.47 mg/dL (ref 0.44–1.00)
GFR calc Af Amer: >60 mL/min (ref >60)
GFR calc Af Amer: >60 mL/min (ref >60)
GFR calc non Af Amer: >60 mL/min (ref >60)
GFR calc non Af Amer: >60 mL/min (ref >60)
Glucose, Bld: 115 mg/dL — ABNORMAL HIGH (ref 70–99)
Glucose, Bld: 126 mg/dL — ABNORMAL HIGH (ref 70–99)
Potassium: 2.3 mmol/L — CL (ref 3.5–5.1)
Potassium: 3.9 mmol/L (ref 3.5–5.1)
Sodium: 130 mmol/L — ABNORMAL LOW (ref 135–145)
Sodium: 131 mmol/L — ABNORMAL LOW (ref 135–145)

## 2019-06-08 LAB — URINE CULTURE: Culture: <10000 — AB

## 2019-06-08 LAB — BPAM RBC
Blood Product Expiration Date: 202102252359
Blood Product Expiration Date: 202102252359
ISSUE DATE / TIME: 202102010726
ISSUE DATE / TIME: 202102010726
Unit Type and Rh: 5100
Unit Type and Rh: 5100

## 2019-06-08 LAB — LIPID PANEL
Cholesterol: 93 mg/dL (ref 0–200)
HDL: 25 mg/dL — ABNORMAL LOW (ref >40)
LDL Cholesterol: 44 mg/dL (ref 0–99)
Total CHOL/HDL Ratio: 3.7 RATIO
Triglycerides: 120 mg/dL (ref <150)
VLDL: 24 mg/dL (ref 0–40)

## 2019-06-08 MED ORDER — FENTANYL 25 MCG/HR TD PT72
1.0000 | MEDICATED_PATCH | TRANSDERMAL | Status: DC
  Administered 2019-06-08 – 2019-06-11 (×2): 1 via TRANSDERMAL
  Filled 2019-06-08 (×2): qty 1

## 2019-06-08 MED ORDER — POTASSIUM CHLORIDE 20 MEQ/15ML (10%) PO SOLN: 40.0000 meq | Freq: Once | ORAL | Status: DC

## 2019-06-08 MED ORDER — METOCLOPRAMIDE HCL 5 MG/ML IJ SOLN
10.0000 mg | Freq: Four times a day (QID) | INTRAMUSCULAR | Status: DC
  Administered 2019-06-08: 10 mg via INTRAVENOUS
  Filled 2019-06-08: qty 2

## 2019-06-08 MED ORDER — KCL IN DEXTROSE-NACL 40-5-0.45 MEQ/L-%-% IV SOLN
INTRAVENOUS | Status: DC
  Filled 2019-06-08 (×7): qty 1000

## 2019-06-08 MED ORDER — SODIUM CHLORIDE 0.9% FLUSH
10.0000 mL | Freq: Two times a day (BID) | INTRAVENOUS | Status: DC
  Administered 2019-06-08 – 2019-06-10 (×4): 10 mL

## 2019-06-08 MED ORDER — OXYCODONE HCL 5 MG/5ML PO SOLN: 5.0000 mg | ORAL | Status: DC | PRN

## 2019-06-08 MED ORDER — POTASSIUM CHLORIDE 20 MEQ/15ML (10%) PO SOLN: 80.0000 meq | Freq: Two times a day (BID) | ORAL | Status: DC

## 2019-06-08 MED ORDER — MAGNESIUM OXIDE 400 (241.3 MG) MG PO TABS
400.0000 mg | ORAL_TABLET | Freq: Two times a day (BID) | ORAL | Status: DC
  Administered 2019-06-08 – 2019-06-13 (×11): 400 mg via ORAL
  Filled 2019-06-08 (×11): qty 1

## 2019-06-08 MED ORDER — SODIUM CHLORIDE 0.9% FLUSH: 10.0000 mL | INTRAVENOUS | Status: DC | PRN

## 2019-06-08 MED ORDER — FOLIC ACID 1 MG PO TABS
1.0000 mg | ORAL_TABLET | Freq: Every day | ORAL | Status: DC
  Administered 2019-06-08 – 2019-06-13 (×6): 1 mg via ORAL
  Filled 2019-06-08 (×6): qty 1

## 2019-06-08 MED ORDER — METOPROLOL SUCCINATE ER 25 MG PO TB24
25.0000 mg | ORAL_TABLET | Freq: Every day | ORAL | Status: DC
  Filled 2019-06-08 (×3): qty 1

## 2019-06-08 MED ORDER — POTASSIUM CITRATE ER 10 MEQ (1080 MG) PO TBCR
40.0000 meq | EXTENDED_RELEASE_TABLET | Freq: Once | ORAL | Status: DC
  Filled 2019-06-08: qty 4

## 2019-06-08 MED ORDER — POTASSIUM CHLORIDE CRYS ER 10 MEQ PO TBCR: 40.0000 meq | EXTENDED_RELEASE_TABLET | Freq: Once | ORAL | Status: DC

## 2019-06-08 MED ORDER — POTASSIUM CHLORIDE CRYS ER 10 MEQ PO TBCR
40.0000 meq | EXTENDED_RELEASE_TABLET | Freq: Once | ORAL | Status: DC
  Filled 2019-06-08: qty 4

## 2019-06-08 MED ORDER — POTASSIUM CHLORIDE CRYS ER 10 MEQ PO TBCR
40.0000 meq | EXTENDED_RELEASE_TABLET | ORAL | Status: DC
  Administered 2019-06-08: 05:00:00 40 meq via ORAL
  Filled 2019-06-08: qty 4
  Filled 2019-06-08: qty 2

## 2019-06-08 NOTE — Progress Notes (Signed)
  Speech Language Pathology Treatment: Dysphagia  Patient Details Name: [name] MRN: [id number] DOB: 15-Dec-1950 Today's Date: 06/08/2019 Time: 2956-2130 SLP Time Calculation (min) (ACUTE ONLY): 17 min  Assessment / Plan / Recommendation Clinical Impression  Pt sleepy but able to remain awake, She reports continued esophageal pain due to esophagitis and is cognitively intact to avoid foods that are irritating. Also now with small ileus but able to continue po's per surgery.No s/s aspiration with straw sips gingerale or ice cream and she has not complained of this. SLP reviewed strategies to minimize symptoms related to esophagitis and pt verbalized understanding. Continue regular diet/thin liquids. ST to sign off.    HPI HPI: 69 year old woman with a history of tobacco abuse, COPD, anxiety, hypertension, dyslipidemia, endometriosis, and migraines. 1.3 cm mixed density nodule in the right upper lobe on a low-dose CT for lung cancer screening.  That nodule is hypermetabolic on PET/CT with an SUV of 2.4.  These findings are highly suspicious for a stage Ia (T1, N0) non-small cell lung cancer. Right upper lobectomy 05/28/19. Pt report tongue is numb, and feels like she has an air buggle in her esophagus      SLP Plan  MBS;Discharge SLP treatment due to (comment)       Recommendations  Diet recommendations: Regular;Thin liquid Liquids provided via: Straw;Cup Medication Administration: Whole meds with liquid Supervision: Patient able to self feed Compensations: Follow solids with liquid Postural Changes and/or Swallow Maneuvers: Seated upright 90 degrees;Upright 30-60 min after meal                Oral Care Recommendations: Oral care BID Follow up Recommendations: None SLP Visit Diagnosis: Dysphagia, unspecified (R13.10) Plan: MBS;Discharge SLP treatment due to (comment)       GO                Houston Siren 06/08/2019, 11:39 AM

## 2019-06-08 NOTE — Progress Notes (Addendum)
[address]       [place] [address]             [phone]      3 Days Post-Op Procedure(s) (LRB): ESOPHAGOGASTRODUODENOSCOPY (EGD) (Left) BIOPSY   Subjective:  Patient not doing well this morning.  She states that she has had pain all night as the pain medication was changed and the current medication isn't giving her any relief.  She is requesting the other medication be restarted.  She states she has been tolerating oral intake, she just avoids foods that are causing her discomfort.  Denies further N/V  Objective: Vital signs in last 24 hours: Temp:  [97.6 F (36.4 C)-98.3 F (36.8 C)] 98.3 F (36.8 C) (02/12 0353) Pulse Rate:  [69-89] 81 (02/12 0353) Cardiac Rhythm: Sinus tachycardia;Supraventricular tachycardia (02/12 0139) Resp:  [17-20] 17 (02/12 0353) BP: (105-119)/(64-83) 119/78 (02/12 0353) SpO2:  [91 %-99 %] 98 % (02/12 0353)  Intake/Output from previous day: 02/11 0701 - 02/12 0700 In: 330 [P.O.:330] Out: 1240 [Urine:1200; Chest Tube:40]  General appearance: alert, cooperative and no distress Heart: regular rate and rhythm Lungs: clear to auscultation bilaterally Abdomen: soft, non-tender; bowel sounds normal; no masses,  no organomegaly Extremities: extremities normal, atraumatic, no cyanosis or edema Wound: clean, mild serous drainage  Lab Results: No results for input(s): WBC, HGB, HCT, PLT in the last 72 hours. BMET:  Recent Labs    06/07/19 1625 06/08/19 0116  NA 132* 130*  K 2.6* 2.3*  CL 82* 79*  CO2 38* 41*  GLUCOSE 128* 115*  BUN <5* <5*  CREATININE 0.48 0.45  CALCIUM 8.9 8.9    PT/INR: No results for input(s): LABPROT, INR in the last 72 hours. ABG    Component Value Date/Time   PHART 7.468 (H) 05/29/2019 0423   HCO3 39.2 (H) 05/29/2019 0423   TCO2 30 05/28/2019 1115   ACIDBASEDEF 1.0 05/28/2019 1115   O2SAT 69.3 05/29/2019 0423   CBG (last 3)  No results for input(s): GLUCAP in the last 72  hours.  Assessment/Plan: S/P Procedure(s) (LRB): ESOPHAGOGASTRODUODENOSCOPY (EGD) (Left) BIOPSY  1. CV- patient developed WCT with ambulation yesterday, Cardiology/EP following- she is Sinus Bradycardia this morning 2. Pulm- CXR shows stable right apical space, chest tube with air leak this morning, will leave chest tube on water seal 3. Hypokalemia- contacted via nursing last evening ordered, ordered replacement, repeat labs drawn early this morning, remain low, will give 80 meq solution BID, will also add folic acid 4. Hypomagnesemia- supplement 5. Pain control- will restart patient Roxicodone at 5 mg every 4 hours for severe pain.. patient states her and son have history of drug abuse and that's why he is concerned about her taking medication, however she is not getting any relief with Tramadol.. I have spoke with nursing who will continue Tramadol and only use Roxicodone when Tramadol not providing relief 6. Esophagitis- on carafate, magic mouthwash, continue as ordered, instructed patient to take potassium prior to carafate as could this be inhibiting absorption of the medication 7. Lovenox for DVT prophylaxis 8. Dispo- patient stable this morning, pain medication adjusted, need to get electrolytes back to WNL, continue supplementation will repeat BMET later today, repeat CXR in AM   LOS: 11 days    Ellwood Handler, PA-C  06/08/2019 Patient seen and examined, agree with above She was horribly oversedated with oxycodone. She did reasonably well with fentanyl IV, will try a duragesic patch in addition to tramadol  Persistent hypokalemia- will give K Po but will also place a PICC line to give K IV as well.  She is dry with hyponatremia- will give IVF today as well Abdomen is distended and mildly tender, will get a KUB and ask GI/Gen Surg for input No air leak when I saw her, continues to be intermittent  Remo Lipps C. Roxan Hockey, MD [facility] [phone]

## 2019-06-08 NOTE — Progress Notes (Signed)
Subjective: PO intake is improving.  Oral potassium exacerbates her esophagitis.  Objective: Vital signs in last 24 hours: Temp:  [97.6 F (36.4 C)-98.3 F (36.8 C)] 98.3 F (36.8 C) (02/12 0353) Pulse Rate:  [69-81] 70 (02/12 0846) Resp:  [17-20] 20 (02/12 0846) BP: (105-119)/(62-78) 112/62 (02/12 0846) SpO2:  [95 %-99 %] 95 % (02/12 0846) Last BM Date: 06/04/19  Intake/Output from previous day: 02/11 0701 - 02/12 0700 In: 330 [P.O.:330] Out: 1250 [Urine:1200; Chest Tube:50] Intake/Output this shift: No intake/output data recorded.  General appearance: alert and no distress GI: soft, distended, tympanic  Lab Results: No results for input(s): WBC, HGB, HCT, PLT in the last 72 hours. BMET Recent Labs    06/07/19 1625 06/08/19 0116 06/08/19 1226  NA 132* 130* 131*  K 2.6* 2.3* 3.9  CL 82* 79* 86*  CO2 38* 41* 38*  GLUCOSE 128* 115* 126*  BUN <5* <5* <5*  CREATININE 0.48 0.45 0.47  CALCIUM 8.9 8.9 8.6*   LFT No results for input(s): PROT, ALBUMIN, AST, ALT, ALKPHOS, BILITOT, BILIDIR, IBILI in the last 72 hours. PT/INR No results for input(s): LABPROT, INR in the last 72 hours. Hepatitis Panel No results for input(s): HEPBSAG, HCVAB, HEPAIGM, HEPBIGM in the last 72 hours. C-Diff No results for input(s): CDIFFTOX in the last 72 hours. Fecal Lactopherrin No results for input(s): FECLLACTOFRN in the last 72 hours.  Studies/Results: DG Chest 2 View  Result Date: 06/08/2019 CLINICAL DATA:  Evaluate chest tube. History of right upper lobectomy. EXAM: CHEST - 2 VIEW COMPARISON:  06/07/2019 FINDINGS: Right pigtail chest tube is stable in position. There continues to be a right apical pneumothorax which is mildly complex and unchanged. Stable densities in the right hilum with postoperative changes. Stable volume loss in the right hemithorax. Few densities at the left lung base and suspect atelectasis and small left pleural effusion. Atherosclerotic calcifications at the  aortic arch. Stable appearance of the heart and mediastinum. IMPRESSION: 1. Stable appearance of the right apical pneumothorax. Stable position of the right chest tube. 2. Atelectasis and probable small effusion at the left lung base. 3. Postsurgical changes and volume loss in the right hemithorax. Electronically Signed   By: Markus Daft [name]   On: 06/08/2019 08:45   DG Chest 1V REPEAT Same Day  Result Date: 06/07/2019 CLINICAL DATA:  Post lobectomy EXAM: CHEST - 1 VIEW SAME DAY COMPARISON:  Portable exam at 1233 hrs compared to 0629 hrs FINDINGS: Pigtail RIGHT thoracostomy tube again identified. Normal heart size, mediastinal contours, and pulmonary vascularity. Atherosclerotic calcification aorta. Postoperative changes with staple line and atelectasis in RIGHT perihilar region and RIGHT upper lobe. Persistent RIGHT apex pneumothorax little changed in size since prior study. At underlying emphysematous changes with mild atelectasis and effusion at LEFT base again seen. Bones demineralized. IMPRESSION: Persistent RIGHT apex pneumothorax despite RIGHT thoracostomy tube. Changes of COPD with postoperative changes in the RIGHT upper lobe. Subsegmental atelectasis and small pleural effusion at LEFT base. Electronically Signed   By: Lavonia Dana [name]   On: 06/07/2019 12:49   DG CHEST PORT 1 VIEW  Result Date: 06/07/2019 CLINICAL DATA:  Follow-up right chest tube EXAM: PORTABLE CHEST 1 VIEW COMPARISON:  06/06/2019 FINDINGS: Cardiac shadow is stable. Right jugular central line is been removed in the interval. Right chest tube is again seen with persistent apical pneumothorax. Postsurgical changes in the right hilum are noted. No new focal abnormality on the right is seen. Minimal left pleural effusion is noted. IMPRESSION:  No significant change from the prior exam. Electronically Signed   By: Inez Catalina [name]   On: 06/07/2019 09:33   DG Abd Portable 1V  Result Date: 06/08/2019 CLINICAL DATA:  Ileus. EXAM:  PORTABLE ABDOMEN - 1 VIEW COMPARISON:  06/03/2019 and chest radiograph 06/08/2019 FINDINGS: Partial visualization of the right basilar chest tube. Multiple round densities overlying the left upper abdomen are probably external to the patient. There is gas within small and large bowel loops. Persistent dilated small bowel in the left lower abdomen measuring up to 4.1 cm, previously measured 3.2 cm. Increased bowel gas distension in the pelvis. There is gas in the rectum. Evidence for cholecystectomy clips in the right upper quadrant of the abdomen. IMPRESSION: Slightly increased small bowel gas distension in the lower abdomen and pelvis. However, there is evidence for gas in the colon and rectum. Findings are compatible with an ileus. Well-circumscribed round densities overlying the left upper abdomen are likely external to the patient. Electronically Signed   By: Markus Daft [name]   On: 06/08/2019 09:17   ECHOCARDIOGRAM COMPLETE  Result Date: 06/07/2019    ECHOCARDIOGRAM REPORT   Patient Name:   [name] Date of Exam: 06/07/2019 Medical Rec #:  [id number]       Height:       64.0 in Accession #:    [id number]      Weight:       108.0 lb Date of Birth:  16-Jul-1950       BSA:          1.51 m Patient Age:    69 years        BP:           119/70 mmHg Patient Gender: F               HR:           71 bpm. Exam Location:  Inpatient Procedure: 2D Echo, Cardiac Doppler and Color Doppler Indications:    R00.0 Tachycardia  History:        Patient has no prior history of Echocardiogram examinations.                 Risk Factors:Hypertension, Dyslipidemia, Current Smoker and                 Sleep Apnea.  Sonographer:    Jonelle Sidle Dance Referring Phys: [id number] [place]  1. Left ventricular ejection fraction, by estimation, is 55 to 60%. The left ventricle has normal function. The left ventrical has no regional wall motion abnormalities. Left ventricular diastolic parameters are consistent with Grade I  diastolic dysfunction (impaired relaxation).  2. Right ventricular systolic function is normal. The right ventricular size is normal.  3. The mitral valve is normal in structure and function. no evidence of mitral valve regurgitation. No evidence of mitral stenosis.  4. The aortic valve is normal in structure and function. Aortic valve regurgitation is not visualized. No aortic stenosis is present.  5. Aortic dilatation noted. There is moderate dilatation of the ascending aorta measuring 39 mm. FINDINGS  Left Ventricle: Left ventricular ejection fraction, by estimation, is 55 to 60%. The left ventricle has normal function. The left ventricle has no regional wall motion abnormalities. There is no left ventricular hypertrophy. Left ventricular diastolic parameters are consistent with Grade I diastolic dysfunction (impaired relaxation). Right Ventricle: The right ventricular size is normal. No increase in right ventricular wall thickness. Right ventricular systolic function is  normal. Left Atrium: Left atrial size was normal in size. Right Atrium: Right atrial size was normal in size. Pericardium: There is no evidence of pericardial effusion. Mitral Valve: The mitral valve is normal in structure and function. No evidence of mitral valve regurgitation. No evidence of mitral valve stenosis. Tricuspid Valve: The tricuspid valve is grossly normal. Tricuspid valve regurgitation is trivial. Aortic Valve: The aortic valve is normal in structure and function. Aortic valve regurgitation is not visualized. No aortic stenosis is present. Pulmonic Valve: The pulmonic valve was grossly normal. Pulmonic valve regurgitation is trivial. Aorta: The aortic root, ascending aorta and aortic arch are all structurally normal, with no evidence of dilitation or obstruction and aortic dilatation noted. There is moderate dilatation of the ascending aorta measuring 39 mm. IAS/Shunts: The atrial septum is grossly normal.  LEFT VENTRICLE PLAX 2D  LVIDd:         4.00 cm  Diastology LVIDs:         3.50 cm  LV e' lateral:   7.51 cm/s LV PW:         0.80 cm  LV E/e' lateral: 8.0 LV IVS:        0.90 cm  LV e' medial:    4.68 cm/s LVOT diam:     2.10 cm  LV E/e' medial:  12.8 LV SV:         58.88 ml LV SV Index:   12.92 LVOT Area:     3.46 cm  RIGHT VENTRICLE             IVC RV Basal diam:  2.40 cm     IVC diam: 1.10 cm RV S prime:     14.10 cm/s TAPSE (M-mode): 1.8 cm LEFT ATRIUM             Index       RIGHT ATRIUM          Index LA diam:        2.20 cm 1.46 cm/m  RA Area:     9.27 cm LA Vol (A2C):   16.2 ml 10.76 ml/m RA Volume:   16.00 ml 10.63 ml/m LA Vol (A4C):   22.8 ml 15.14 ml/m LA Biplane Vol: 19.3 ml 12.82 ml/m  AORTIC VALVE LVOT Vmax:   82.50 cm/s LVOT Vmean:  46.900 cm/s LVOT VTI:    0.170 m  AORTA Ao Root diam: 3.30 cm Ao Asc diam:  3.90 cm MITRAL VALVE MV Area (PHT): 2.69 cm             SHUNTS MV Decel Time: 282 msec             Systemic VTI:  0.17 m MV E velocity: 60.00 cm/s 103 cm/s  Systemic Diam: 2.10 cm MV A velocity: 93.40 cm/s 70.3 cm/s MV E/A ratio:  0.64       1.5 Mertie Moores MD Electronically signed by Mertie Moores MD Signature Date/Time: 06/07/2019/4:57:56 PM    Final    [place] EKG SITE RITE  Result Date: 06/08/2019 If Site Rite image not attached, placement could not be confirmed due to current cardiac rhythm.   Medications:  Scheduled: . amLODipine  5 mg Oral Daily  . aspirin EC  81 mg Oral Daily  . calcium carbonate  1 tablet Oral BID  . Chlorhexidine Gluconate Cloth  6 each Topical Daily  . enoxaparin (LOVENOX) injection  40 mg Subcutaneous Daily  . fentaNYL  1 patch Transdermal Q72H  . folic acid  1 mg Oral Daily  . magnesium oxide  400 mg Oral BID  . metoprolol succinate  25 mg Oral Daily  . montelukast  10 mg Oral QHS  . nicotine  21 mg Transdermal Daily  . pantoprazole  40 mg Oral BID  . polyethylene glycol  17 g Oral Daily  . potassium chloride  40 mEq Oral Once  . rosuvastatin  10 mg Oral Daily  .  senna-docusate  1 tablet Oral QHS  . sodium chloride flush  10-40 mL Intracatheter Q12H  . sodium chloride flush  10-40 mL Intracatheter Q12H  . sucralfate  1 g Oral TID WC & HS   Continuous: . dextrose 5 % and 0.45 % NaCl with KCl 40 mEq/L 125 mL/hr at 06/08/19 1435    Assessment/Plan: 1) Ileus. 2) LA Grade D esophagitis. 3) Gastric ulcer. 4) Hypokalemia.   Her ileus is a new finding, but she is able to pass some flatus.  Her relatively recent surgery, immobility, and hypokalemia have contributed to this issue.  She should improve with stability of her electrolytes and more mobility.  Her PO intake is improving.  Plan: 1) Correct electrolytes as needed. 2) Up and in the chair as well as moving from left side to her back frequently.  LOS: 11 days   Antoinne Spadaccini D 06/08/2019, 7:10 PM

## 2019-06-08 NOTE — Progress Notes (Signed)
CRITICAL VALUE ALERT  Critical Value:  K=2.3  Date & Time Notied: 06/08/19 0330  Provider Notified: Dr. Cyndia Bent  Orders Received/Actions taken: Potassium replacement ordered (See MAR)

## 2019-06-08 NOTE — Progress Notes (Signed)
Progress Note  Patient Name: [name] Date of Encounter: 06/08/2019  Primary Cardiologist: Pixie Casino, MD   Subjective   Potassium 2.6 last night, received 80 meq K+ but is 2.3 this morning.  No further NSVT or CHB overnight.  Denies any chest pain.  Inpatient Medications    Scheduled Meds: . amLODipine  5 mg Oral Daily  . aspirin EC  81 mg Oral Daily  . calcium carbonate  1 tablet Oral BID  . Chlorhexidine Gluconate Cloth  6 each Topical Daily  . enoxaparin (LOVENOX) injection  40 mg Subcutaneous Daily  . folic acid  1 mg Oral Daily  . hydrochlorothiazide  12.5 mg Oral Daily  . magnesium oxide  400 mg Oral BID  . metoprolol succinate  25 mg Oral Daily  . montelukast  10 mg Oral QHS  . nicotine  21 mg Transdermal Daily  . pantoprazole  40 mg Oral BID  . polyethylene glycol  17 g Oral Daily  . potassium chloride  80 mEq Oral BID  . rosuvastatin  10 mg Oral Daily  . senna-docusate  1 tablet Oral QHS  . sodium chloride flush  10-40 mL Intracatheter Q12H  . sucralfate  1 g Oral TID WC & HS   Continuous Infusions:  PRN Meds: albuterol, ALPRAZolam, alum & mag hydroxide-simeth, cholestyramine, diphenhydrAMINE, guaiFENesin, magic mouthwash, naloxone, ondansetron (ZOFRAN) IV, oxyCODONE, promethazine, sodium chloride flush, sodium chloride flush, traMADol   Vital Signs    Vitals:   06/07/19 1549 06/07/19 2000 06/07/19 2335 06/08/19 0353  BP: 107/83 105/65 107/64 119/78  Pulse: 89 72 69 81  Resp: 20 17 18 17   Temp: 97.6 F (36.4 C) 97.6 F (36.4 C) 97.9 F (36.6 C) 98.3 F (36.8 C)  TempSrc: Oral Oral Oral Oral  SpO2: 91% 96% 99% 98%  Weight:      Height:        Intake/Output Summary (Last 24 hours) at 06/08/2019 0735 Last data filed at 06/08/2019 0354 Gross per 24 hour  Intake 330 ml  Output 1240 ml  Net -910 ml   Last 3 Weights 05/28/2019 05/24/2019 05/09/2019  Weight (lbs) 108 lb 108 lb 106 lb  Weight (kg) 48.988 kg 48.988 kg 48.081 kg       Telemetry    NSR in 80s, no further NSVT or CHB- Personally Reviewed  ECG    No new ECG- Personally Reviewed  Physical Exam   GEN: No acute distress.   Neck: No JVD Cardiac: RRR, no murmurs Respiratory: Diminished breath sounds GI: Soft, nontender MS: No edema Neuro:  Nonfocal  Psych: Normal affect   Labs    High Sensitivity Troponin:  No results for input(s): TROPONINIHS in the last 720 hours.    Chemistry Recent Labs  Lab 06/05/19 0415 06/07/19 1625 06/08/19 0116  NA 131* 132* 130*  K 3.2* 2.6* 2.3*  CL 93* 82* 79*  CO2 27 38* 41*  GLUCOSE 93 128* 115*  BUN <5* <5* <5*  CREATININE 0.40* 0.48 0.45  CALCIUM 7.8* 8.9 8.9  GFRNONAA >60 >60 >60  GFRAA >60 >60 >60  ANIONGAP 11 12 10      Hematology Recent Labs  Lab 06/03/19 0415 06/04/19 0306  WBC 11.5* 10.4  RBC 3.11* 2.99*  HGB 10.4* 10.1*  HCT 31.8* 30.0*  MCV 102.3* 100.3*  MCH 33.4 33.8  MCHC 32.7 33.7  RDW 13.2 13.0  PLT 253 263    BNPNo results for input(s): BNP, PROBNP in the last  168 hours.   DDimer No results for input(s): DDIMER in the last 168 hours.   Radiology    DG Chest 1V REPEAT Same Day  Result Date: 06/07/2019 CLINICAL DATA:  Post lobectomy EXAM: CHEST - 1 VIEW SAME DAY COMPARISON:  Portable exam at 1233 hrs compared to 0629 hrs FINDINGS: Pigtail RIGHT thoracostomy tube again identified. Normal heart size, mediastinal contours, and pulmonary vascularity. Atherosclerotic calcification aorta. Postoperative changes with staple line and atelectasis in RIGHT perihilar region and RIGHT upper lobe. Persistent RIGHT apex pneumothorax little changed in size since prior study. At underlying emphysematous changes with mild atelectasis and effusion at LEFT base again seen. Bones demineralized. IMPRESSION: Persistent RIGHT apex pneumothorax despite RIGHT thoracostomy tube. Changes of COPD with postoperative changes in the RIGHT upper lobe. Subsegmental atelectasis and small pleural effusion at  LEFT base. Electronically Signed   By: Lavonia Dana [name]   On: 06/07/2019 12:49   DG CHEST PORT 1 VIEW  Result Date: 06/07/2019 CLINICAL DATA:  Follow-up right chest tube EXAM: PORTABLE CHEST 1 VIEW COMPARISON:  06/06/2019 FINDINGS: Cardiac shadow is stable. Right jugular central line is been removed in the interval. Right chest tube is again seen with persistent apical pneumothorax. Postsurgical changes in the right hilum are noted. No new focal abnormality on the right is seen. Minimal left pleural effusion is noted. IMPRESSION: No significant change from the prior exam. Electronically Signed   By: Inez Catalina [name]   On: 06/07/2019 09:33   ECHOCARDIOGRAM COMPLETE  Result Date: 06/07/2019    ECHOCARDIOGRAM REPORT   Patient Name:   [name] Date of Exam: 06/07/2019 Medical Rec #:  [id number]       Height:       64.0 in Accession #:    [id number]      Weight:       108.0 lb Date of Birth:  May 31, 1950       BSA:          1.51 m Patient Age:    69 years        BP:           119/70 mmHg Patient Gender: F               HR:           71 bpm. Exam Location:  Inpatient Procedure: 2D Echo, Cardiac Doppler and Color Doppler Indications:    R00.0 Tachycardia  History:        Patient has no prior history of Echocardiogram examinations.                 Risk Factors:Hypertension, Dyslipidemia, Current Smoker and                 Sleep Apnea.  Sonographer:    Jonelle Sidle Dance Referring Phys: [id number] [place]  1. Left ventricular ejection fraction, by estimation, is 55 to 60%. The left ventricle has normal function. The left ventrical has no regional wall motion abnormalities. Left ventricular diastolic parameters are consistent with Grade I diastolic dysfunction (impaired relaxation).  2. Right ventricular systolic function is normal. The right ventricular size is normal.  3. The mitral valve is normal in structure and function. no evidence of mitral valve regurgitation. No evidence of mitral stenosis.   4. The aortic valve is normal in structure and function. Aortic valve regurgitation is not visualized. No aortic stenosis is present.  5. Aortic dilatation noted. There is moderate dilatation of the ascending  aorta measuring 39 mm. FINDINGS  Left Ventricle: Left ventricular ejection fraction, by estimation, is 55 to 60%. The left ventricle has normal function. The left ventricle has no regional wall motion abnormalities. There is no left ventricular hypertrophy. Left ventricular diastolic parameters are consistent with Grade I diastolic dysfunction (impaired relaxation). Right Ventricle: The right ventricular size is normal. No increase in right ventricular wall thickness. Right ventricular systolic function is normal. Left Atrium: Left atrial size was normal in size. Right Atrium: Right atrial size was normal in size. Pericardium: There is no evidence of pericardial effusion. Mitral Valve: The mitral valve is normal in structure and function. No evidence of mitral valve regurgitation. No evidence of mitral valve stenosis. Tricuspid Valve: The tricuspid valve is grossly normal. Tricuspid valve regurgitation is trivial. Aortic Valve: The aortic valve is normal in structure and function. Aortic valve regurgitation is not visualized. No aortic stenosis is present. Pulmonic Valve: The pulmonic valve was grossly normal. Pulmonic valve regurgitation is trivial. Aorta: The aortic root, ascending aorta and aortic arch are all structurally normal, with no evidence of dilitation or obstruction and aortic dilatation noted. There is moderate dilatation of the ascending aorta measuring 39 mm. IAS/Shunts: The atrial septum is grossly normal.  LEFT VENTRICLE PLAX 2D LVIDd:         4.00 cm  Diastology LVIDs:         3.50 cm  LV e' lateral:   7.51 cm/s LV PW:         0.80 cm  LV E/e' lateral: 8.0 LV IVS:        0.90 cm  LV e' medial:    4.68 cm/s LVOT diam:     2.10 cm  LV E/e' medial:  12.8 LV SV:         58.88 ml LV SV Index:    12.92 LVOT Area:     3.46 cm  RIGHT VENTRICLE             IVC RV Basal diam:  2.40 cm     IVC diam: 1.10 cm RV S prime:     14.10 cm/s TAPSE (M-mode): 1.8 cm LEFT ATRIUM             Index       RIGHT ATRIUM          Index LA diam:        2.20 cm 1.46 cm/m  RA Area:     9.27 cm LA Vol (A2C):   16.2 ml 10.76 ml/m RA Volume:   16.00 ml 10.63 ml/m LA Vol (A4C):   22.8 ml 15.14 ml/m LA Biplane Vol: 19.3 ml 12.82 ml/m  AORTIC VALVE LVOT Vmax:   82.50 cm/s LVOT Vmean:  46.900 cm/s LVOT VTI:    0.170 m  AORTA Ao Root diam: 3.30 cm Ao Asc diam:  3.90 cm MITRAL VALVE MV Area (PHT): 2.69 cm             SHUNTS MV Decel Time: 282 msec             Systemic VTI:  0.17 m MV E velocity: 60.00 cm/s 103 cm/s  Systemic Diam: 2.10 cm MV A velocity: 93.40 cm/s 70.3 cm/s MV E/A ratio:  0.64       1.5 Mertie Moores MD Electronically signed by Mertie Moores MD Signature Date/Time: 06/07/2019/4:57:56 PM    Final     Cardiac Studies   TTE 06/06/18: 1. Left ventricular ejection fraction, by estimation, is  55 to 60%. The  left ventricle has normal function. The left ventrical has no regional  wall motion abnormalities. Left ventricular diastolic parameters are  consistent with Grade I diastolic  dysfunction (impaired relaxation).  2. Right ventricular systolic function is normal. The right ventricular  size is normal.  3. The mitral valve is normal in structure and function. no evidence of  mitral valve regurgitation. No evidence of mitral stenosis.  4. The aortic valve is normal in structure and function. Aortic valve  regurgitation is not visualized. No aortic stenosis is present.  5. Aortic dilatation noted. There is moderate dilatation of the ascending  aorta measuring 39 mm.   Patient Profile     69 y.o. female with a hx HTN, anxiety, HLD, tobacco use, COPD, stress test in 2017 that was low risk with EF 65%,  lymphomic colitis in 2018  who is being seen today for the evaluation of WCT  Assessment & Plan     Wide complex tachcyardia: 22 beat run (6 seconds) of WCT during ambulation on 2/11, consistent with NSVT.  Suspect 2/2 hypokalemia, K+ was 2.6.  Received repletion yesterday but K+ is 2.3 today.  TTE shows normal LV systolic function.  Ischemia also on differential - Replete K+ for goal >4, Mag for goal >2 - If further VT once electrolytes repleted, can consider ischemia evaluation, as she does have multivessel coronary calcifications on CT chest.  However, currently being treated for esophagitis/gastric ulcers so is likely not a candidate for antiplatelet therapy at this time.  She denies any exertional chest pain, though functional capacity has been limited.  Can consider outpatient ischemia evaluation once GI issues resolved  Complete Heart Block: episode lasted <1 minute on 2/10.  Patient was awake at that time and asymptomatic - EP consulted, appreciate recs: will monitor  Prolonged QT: likely 2/2 hypokalemia, will recheck EKG once electrolytes repleted  For questions or updates, please contact [place] Please consult [email or web address] for contact info under       Signed, Donato Heinz, MD  06/08/2019, 7:35 AM

## 2019-06-08 NOTE — Consult Note (Signed)
[name] 1950-05-28  [id number].    Requesting MD: Dr. Modesto Charon Chief Complaint/Reason for Consult: abdominal distention  HPI:  This is a 69 yo white female with a history of tobacco abuse and HTN who was found to have a RUL lung nodule.  She was admitted and underwent a RUL lobectomy on 2/1 by Dr. Roxan Hockey.  She had a chest tube placed and this remains in place secondary to a persistent air leak.  After surgery she developed N/V along with some diarrhea she says before the N/V.  She was also noted to have odynophagia.  GI was consulted and she underwent an EGD that revealed esophagitis.  She has been started on Carafate.  She has not had a BM in 3 days but is starting to pass some flatus today.  She complained of some increasing abdominal distention and a film today revealed some mildly dilated colon and small bowel consistent with an ileus.  Her potassium is 2.3 today with some other electrolyte abnormalities.  She has been started on Reglan as well.  Cardiology also saw her yesterday for an episode of NSVT. We have been asked to see her for further recommendations regarding her abdominal distention.  ROS: ROS: Please see HPI, otherwise all other systems reviewed and are negative.  Family History  Problem Relation Age of Onset  . Hypertension Mother   . Cancer Mother        LIVER AND LUNG  . Hypertension Brother   . Heart disease Brother   . Hypertension Maternal Grandfather   . Heart disease Maternal Grandfather   . Heart attack Maternal Grandfather     Past Medical History:  Diagnosis Date  . Active smoker   . Anxiety   . Cervical dysplasia   . Complication of anesthesia    "I'm usually hard to wake up"  . Dyslipidemia   . Endometriosis   . History of nuclear stress test 07/07/2010   dipyridamole; low risk, post-stress EF 65%  . Hypertension   . Migraine   . Osteopenia    08/2006 and 08/2008 Dexa Scans w [facility] Blakes   . Pneumonia    hx pneunomia  ~2016  . PONV (postoperative nausea and vomiting)   . Sleep apnea    does not wear her CPAP because "it gave me respiratory issues (bronchities and pneumonia)"    Past Surgical History:  Procedure Laterality Date  . BACK SURGERY  2002   RUPTURED DISC   . CARDIAC CATHETERIZATION  11/20/2002   patent coronary arteries   . CHOLECYSTECTOMY  2006  . COLPOSCOPY    . GYNECOLOGIC CRYOSURGERY    . INTERCOSTAL NERVE BLOCK Right 05/28/2019   Procedure: Intercostal Nerve Block;  Surgeon: Melrose Nakayama, MD;  Location: [facility];  Service: Thoracic;  Laterality: Right;  . NODE DISSECTION  05/28/2019   Procedure: Node Dissection;  Surgeon: Melrose Nakayama, MD;  Location: [facility] OR;  Service: Thoracic;;  . NOSE SURGERY     X2  . OOPHORECTOMY     BSO  . SHOULDER SURGERY     ROTATOR CUFF X 2  . THORACOSCOPY  05/28/2019   THORASCOPY-WEDGE RESECTION AND  RIGHT  UPPER LOBECTOMY (Right)  . THROAT SURGERY     REMOVAL OF TUMOR  . TONSILLECTOMY    . VAGINAL HYSTERECTOMY  2000   WITH BSO    Social History:  reports that she has been smoking cigarettes. She has a 42.00 pack-year smoking history. She  has never used smokeless tobacco. She reports current alcohol use of about 10.0 standard drinks of alcohol per week. She reports that she does not use drugs.  Allergies:  Allergies  Allergen Reactions  . Iodinated Diagnostic Agents Itching    itching but no hives just after iv contrast injection w/ 13 hr prep, future contrast not advised unless absolutely necessary, 13 hr prep would still be advised.//a.calhoun  . Ioxaglate Itching    itching but no hives just after iv contrast injection w/ 13 hr prep, future contrast not advised unless absolutely necessary, 13 hr prep would still be advised.//a.calhoun  . Omnipaque [Iohexol] Anaphylaxis  . Buprenorphine Hcl Nausea And Vomiting  . Codeine Nausea And Vomiting  . Erythromycin   . Morphine And Related Nausea And Vomiting    Medications Prior to  Admission  Medication Sig Dispense Refill  . amLODipine (NORVASC) 5 MG tablet Take 1 tablet (5 mg total) by mouth daily. 90 tablet 1  . aspirin EC 81 MG tablet Take 81 mg by mouth daily.    . cholestyramine (QUESTRAN) 4 GM/DOSE powder Take 4 g by mouth daily.     . metoprolol succinate (TOPROL-XL) 25 MG 24 hr tablet Take 1 tablet (25 mg total) by mouth daily. Take with or immediately following a meal. 90 tablet 3  . montelukast (SINGULAIR) 10 MG tablet Take 10 mg by mouth at bedtime.   0  . rosuvastatin (CRESTOR) 10 MG tablet Take 1 tablet (10 mg total) by mouth daily. 90 tablet 1  . telmisartan-hydrochlorothiazide (MICARDIS HCT) 80-12.5 MG tablet Take 1 tablet by mouth daily. 90 tablet 1  . temazepam (RESTORIL) 15 MG capsule Take 15 mg by mouth at bedtime.     . VENTOLIN HFA 108 (90 BASE) MCG/ACT inhaler Inhale 2 puffs into the lungs every 6 (six) hours as needed for wheezing or shortness of breath.     . Vitamin D, Ergocalciferol, (DRISDOL) 1.25 MG (50000 UT) CAPS capsule Take 50,000 Units by mouth once a week.     . denosumab (PROLIA) 60 MG/ML SOLN injection Inject 60 mg into the skin every 6 (six) months. Administer in upper arm, thigh, or abdomen       Physical Exam: Blood pressure 112/62, pulse 70, temperature 98.3 F (36.8 C), temperature source Oral, resp. rate 20, height 5\' 4"  (1.626 m), weight 49 kg, SpO2 95 %. General: pleasant, WD, WN white female who is laying in bed in NAD HEENT: head is normocephalic, atraumatic.  Sclera are noninjected.  PERRL.  Ears and nose without any masses or lesions.  Mouth is pink and moist Heart: regular, rate, and rhythm.  Normal s1,s2. No obvious murmurs, gallops, or rubs noted.  Palpable radial and pedal pulses bilaterally Lungs: CTAB but with absent BS in RUL secondary to resection, no wheezes, rhonchi, or rales noted.  Respiratory effort nonlabored.  CT in place on right side with serous fluid in cannister Abd: soft, mild diffuse tenderness, but no  guarding or peritonitis.  bloated, +BS, no masses, hernias, or organomegaly MS: all 4 extremities are symmetrical with no cyanosis, clubbing, or edema. Skin: warm and dry with no masses, lesions, or rashes Neuro: Cranial nerves 2-12 grossly intact, speech is normal Psych: A&Ox3 with an appropriate affect.   Results for orders placed or performed during the hospital encounter of 05/28/19 (from the past 48 hour(s))  Magnesium     Status: Abnormal   Collection Time: 06/07/19  4:25 PM  Result Value Ref Range  Magnesium 1.6 (L) 1.7 - 2.4 mg/dL    Comment: Performed at [facility], [place] [address], [place], [place] [address]  TSH     Status: None   Collection Time: 06/07/19  4:25 PM  Result Value Ref Range   TSH 2.902 0.350 - 4.500 uIU/mL    Comment: Performed by a 3rd Generation assay with a functional sensitivity of <=0.01 uIU/mL. Performed at [facility], [place] [address] [place], [place] [address]   Basic metabolic panel     Status: Abnormal   Collection Time: 06/07/19  4:25 PM  Result Value Ref Range   Sodium 132 (L) 135 - 145 mmol/L   Potassium 2.6 (LL) 3.5 - 5.1 mmol/L    Comment: CRITICAL RESULT CALLED TO, READ BACK BY AND VERIFIED WITH: A COOPER,RN [id number] 06/07/2019 WBOND    Chloride 82 (L) 98 - 111 mmol/L   CO2 38 (H) 22 - 32 mmol/L   Glucose, Bld 128 (H) 70 - 99 mg/dL   BUN <5 (L) 8 - 23 mg/dL   Creatinine, Ser 0.48 0.44 - 1.00 mg/dL   Calcium 8.9 8.9 - 10.3 mg/dL   GFR calc non Af Amer >60 >60 mL/min   GFR calc Af Amer >60 >60 mL/min   Anion gap 12 5 - 15    Comment: Performed at [facility] [address] [place], [place] [address]  Lipid panel     Status: Abnormal   Collection Time: 06/08/19  1:16 AM  Result Value Ref Range   Cholesterol 93 0 - 200 mg/dL   Triglycerides 120 <150 mg/dL   HDL 25 (L) >40 mg/dL   Total CHOL/HDL Ratio 3.7 RATIO   VLDL 24 0 - 40 mg/dL   LDL Cholesterol 44 0 - 99 mg/dL    Comment:        Total Cholesterol/HDL:CHD  Risk Coronary Heart Disease Risk Table                     Men   Women  1/2 Average Risk   3.4   3.3  Average Risk       5.0   4.4  2 X Average Risk   9.6   7.1  3 X Average Risk  23.4   11.0        Use the calculated Patient Ratio above and the CHD Risk Table to determine the patient's CHD Risk.        ATP III CLASSIFICATION (LDL):  <100     mg/dL   Optimal  100-129  mg/dL   Near or Above                    Optimal  130-159  mg/dL   Borderline  160-189  mg/dL   High  >190     mg/dL   Very High Performed at [facility] [address] [place], [place] [address]   Basic metabolic panel     Status: Abnormal   Collection Time: 06/08/19  1:16 AM  Result Value Ref Range   Sodium 130 (L) 135 - 145 mmol/L   Potassium 2.3 (LL) 3.5 - 5.1 mmol/L    Comment: CRITICAL RESULT CALLED TO, READ BACK BY AND VERIFIED WITH: BROWN R,RN 06/08/19 0309 WAYK    Chloride 79 (L) 98 - 111 mmol/L   CO2 41 (H) 22 - 32 mmol/L   Glucose, Bld 115 (H) 70 - 99 mg/dL  BUN <5 (L) 8 - 23 mg/dL   Creatinine, Ser 0.45 0.44 - 1.00 mg/dL   Calcium 8.9 8.9 - 10.3 mg/dL   GFR calc non Af Amer >60 >60 mL/min   GFR calc Af Amer >60 >60 mL/min   Anion gap 10 5 - 15    Comment: Performed at [facility] [address] [place], [place] [address]   DG Chest 2 View  Result Date: 06/08/2019 CLINICAL DATA:  Evaluate chest tube. History of right upper lobectomy. EXAM: CHEST - 2 VIEW COMPARISON:  06/07/2019 FINDINGS: Right pigtail chest tube is stable in position. There continues to be a right apical pneumothorax which is mildly complex and unchanged. Stable densities in the right hilum with postoperative changes. Stable volume loss in the right hemithorax. Few densities at the left lung base and suspect atelectasis and small left pleural effusion. Atherosclerotic calcifications at the aortic arch. Stable appearance of the heart and mediastinum. IMPRESSION: 1. Stable appearance of the right apical pneumothorax.  Stable position of the right chest tube. 2. Atelectasis and probable small effusion at the left lung base. 3. Postsurgical changes and volume loss in the right hemithorax. Electronically Signed   By: Markus Daft [name]   On: 06/08/2019 08:45   DG Chest 1V REPEAT Same Day  Result Date: 06/07/2019 CLINICAL DATA:  Post lobectomy EXAM: CHEST - 1 VIEW SAME DAY COMPARISON:  Portable exam at 1233 hrs compared to 0629 hrs FINDINGS: Pigtail RIGHT thoracostomy tube again identified. Normal heart size, mediastinal contours, and pulmonary vascularity. Atherosclerotic calcification aorta. Postoperative changes with staple line and atelectasis in RIGHT perihilar region and RIGHT upper lobe. Persistent RIGHT apex pneumothorax little changed in size since prior study. At underlying emphysematous changes with mild atelectasis and effusion at LEFT base again seen. Bones demineralized. IMPRESSION: Persistent RIGHT apex pneumothorax despite RIGHT thoracostomy tube. Changes of COPD with postoperative changes in the RIGHT upper lobe. Subsegmental atelectasis and small pleural effusion at LEFT base. Electronically Signed   By: Lavonia Dana [name]   On: 06/07/2019 12:49   DG CHEST PORT 1 VIEW  Result Date: 06/07/2019 CLINICAL DATA:  Follow-up right chest tube EXAM: PORTABLE CHEST 1 VIEW COMPARISON:  06/06/2019 FINDINGS: Cardiac shadow is stable. Right jugular central line is been removed in the interval. Right chest tube is again seen with persistent apical pneumothorax. Postsurgical changes in the right hilum are noted. No new focal abnormality on the right is seen. Minimal left pleural effusion is noted. IMPRESSION: No significant change from the prior exam. Electronically Signed   By: Inez Catalina [name]   On: 06/07/2019 09:33   DG Abd Portable 1V  Result Date: 06/08/2019 CLINICAL DATA:  Ileus. EXAM: PORTABLE ABDOMEN - 1 VIEW COMPARISON:  06/03/2019 and chest radiograph 06/08/2019 FINDINGS: Partial visualization of the right basilar  chest tube. Multiple round densities overlying the left upper abdomen are probably external to the patient. There is gas within small and large bowel loops. Persistent dilated small bowel in the left lower abdomen measuring up to 4.1 cm, previously measured 3.2 cm. Increased bowel gas distension in the pelvis. There is gas in the rectum. Evidence for cholecystectomy clips in the right upper quadrant of the abdomen. IMPRESSION: Slightly increased small bowel gas distension in the lower abdomen and pelvis. However, there is evidence for gas in the colon and rectum. Findings are compatible with an ileus. Well-circumscribed round densities overlying the left upper abdomen are likely external to the patient. Electronically Signed  By: Markus Daft [name]   On: 06/08/2019 09:17   ECHOCARDIOGRAM COMPLETE  Result Date: 06/07/2019    ECHOCARDIOGRAM REPORT   Patient Name:   [name] Date of Exam: 06/07/2019 Medical Rec #:  [id number]       Height:       64.0 in Accession #:    [id number]      Weight:       108.0 lb Date of Birth:  12/08/1950       BSA:          1.51 m Patient Age:    40 years        BP:           119/70 mmHg Patient Gender: F               HR:           71 bpm. Exam Location:  Inpatient Procedure: 2D Echo, Cardiac Doppler and Color Doppler Indications:    R00.0 Tachycardia  History:        Patient has no prior history of Echocardiogram examinations.                 Risk Factors:Hypertension, Dyslipidemia, Current Smoker and                 Sleep Apnea.  Sonographer:    Jonelle Sidle Dance Referring Phys: [id number] [place]  1. Left ventricular ejection fraction, by estimation, is 55 to 60%. The left ventricle has normal function. The left ventrical has no regional wall motion abnormalities. Left ventricular diastolic parameters are consistent with Grade I diastolic dysfunction (impaired relaxation).  2. Right ventricular systolic function is normal. The right ventricular size is normal.  3.  The mitral valve is normal in structure and function. no evidence of mitral valve regurgitation. No evidence of mitral stenosis.  4. The aortic valve is normal in structure and function. Aortic valve regurgitation is not visualized. No aortic stenosis is present.  5. Aortic dilatation noted. There is moderate dilatation of the ascending aorta measuring 39 mm. FINDINGS  Left Ventricle: Left ventricular ejection fraction, by estimation, is 55 to 60%. The left ventricle has normal function. The left ventricle has no regional wall motion abnormalities. There is no left ventricular hypertrophy. Left ventricular diastolic parameters are consistent with Grade I diastolic dysfunction (impaired relaxation). Right Ventricle: The right ventricular size is normal. No increase in right ventricular wall thickness. Right ventricular systolic function is normal. Left Atrium: Left atrial size was normal in size. Right Atrium: Right atrial size was normal in size. Pericardium: There is no evidence of pericardial effusion. Mitral Valve: The mitral valve is normal in structure and function. No evidence of mitral valve regurgitation. No evidence of mitral valve stenosis. Tricuspid Valve: The tricuspid valve is grossly normal. Tricuspid valve regurgitation is trivial. Aortic Valve: The aortic valve is normal in structure and function. Aortic valve regurgitation is not visualized. No aortic stenosis is present. Pulmonic Valve: The pulmonic valve was grossly normal. Pulmonic valve regurgitation is trivial. Aorta: The aortic root, ascending aorta and aortic arch are all structurally normal, with no evidence of dilitation or obstruction and aortic dilatation noted. There is moderate dilatation of the ascending aorta measuring 39 mm. IAS/Shunts: The atrial septum is grossly normal.  LEFT VENTRICLE PLAX 2D LVIDd:         4.00 cm  Diastology LVIDs:         3.50 cm  LV e'  lateral:   7.51 cm/s LV PW:         0.80 cm  LV E/e' lateral: 8.0 LV IVS:         0.90 cm  LV e' medial:    4.68 cm/s LVOT diam:     2.10 cm  LV E/e' medial:  12.8 LV SV:         58.88 ml LV SV Index:   12.92 LVOT Area:     3.46 cm  RIGHT VENTRICLE             IVC RV Basal diam:  2.40 cm     IVC diam: 1.10 cm RV S prime:     14.10 cm/s TAPSE (M-mode): 1.8 cm LEFT ATRIUM             Index       RIGHT ATRIUM          Index LA diam:        2.20 cm 1.46 cm/m  RA Area:     9.27 cm LA Vol (A2C):   16.2 ml 10.76 ml/m RA Volume:   16.00 ml 10.63 ml/m LA Vol (A4C):   22.8 ml 15.14 ml/m LA Biplane Vol: 19.3 ml 12.82 ml/m  AORTIC VALVE LVOT Vmax:   82.50 cm/s LVOT Vmean:  46.900 cm/s LVOT VTI:    0.170 m  AORTA Ao Root diam: 3.30 cm Ao Asc diam:  3.90 cm MITRAL VALVE MV Area (PHT): 2.69 cm             SHUNTS MV Decel Time: 282 msec             Systemic VTI:  0.17 m MV E velocity: 60.00 cm/s 103 cm/s  Systemic Diam: 2.10 cm MV A velocity: 93.40 cm/s 70.3 cm/s MV E/A ratio:  0.64       1.5 Mertie Moores MD Electronically signed by Mertie Moores MD Signature Date/Time: 06/07/2019/4:57:56 PM    Final    [place] EKG SITE RITE  Result Date: 06/08/2019 If Site Rite image not attached, placement could not be confirmed due to current cardiac rhythm.     Assessment/Plan Lung nodule, s/p RUL lobectomy NSVT, improved  Ileus with significant electrolyte abnormalities (hypokalemia, hyponatremia, hypomagnesemia, hypochloridemia)  The patient is noted to have a mild ileus on her plain abdominal films.  I do not think she needs a CT Scan at this point to further evaluate this process.  Her electrolytes are being replaced.  Hypokalemia can cause and ileus.  Keeping her K above 4 is ideal.  Patient has already been started on Reglan as well for gut motility.  She should mobilize as able.  She said she was told not to move around too much because of her issue with the NSVT yesterday.  She is currently eating her breakfast with no further N/V. She did pass flatus today.  Would continue to watch, but will  likely resolve as some of these other issues begin to improve.  Check labs in am.  Replace mag if not done yet.  We will follow with you.   FEN - regular diet VTE - Lovenox ID - none   Henreitta Cea, [facility] 06/08/2019, 9:53 AM Please see Amion for pager number during day hours 7:00am-4:30pm or 7:00am -11:30am on weekends

## 2019-06-09 ENCOUNTER — Inpatient Hospital Stay (HOSPITAL_COMMUNITY): Payer: Medicare PPO

## 2019-06-09 LAB — URINALYSIS, ROUTINE W REFLEX MICROSCOPIC
Bilirubin Urine: NEGATIVE
Glucose, UA: NEGATIVE mg/dL
Hgb urine dipstick: NEGATIVE
Ketones, ur: NEGATIVE mg/dL
Leukocytes,Ua: NEGATIVE
Nitrite: NEGATIVE
Protein, ur: NEGATIVE mg/dL
Specific Gravity, Urine: 1.004 — ABNORMAL LOW (ref 1.005–1.030)
pH: 9 — ABNORMAL HIGH (ref 5.0–8.0)

## 2019-06-09 LAB — BASIC METABOLIC PANEL
Anion gap: 7 (ref 5–15)
BUN: <5 mg/dL — ABNORMAL LOW (ref 8–23)
CO2: 35 mmol/L — ABNORMAL HIGH (ref 22–32)
Calcium: 8.4 mg/dL — ABNORMAL LOW (ref 8.9–10.3)
Chloride: 90 mmol/L — ABNORMAL LOW (ref 98–111)
Creatinine, Ser: 0.35 mg/dL — ABNORMAL LOW (ref 0.44–1.00)
GFR calc Af Amer: >60 mL/min (ref >60)
GFR calc non Af Amer: >60 mL/min (ref >60)
Glucose, Bld: 128 mg/dL — ABNORMAL HIGH (ref 70–99)
Potassium: 3.6 mmol/L (ref 3.5–5.1)
Sodium: 132 mmol/L — ABNORMAL LOW (ref 135–145)

## 2019-06-09 LAB — PHOSPHORUS: Phosphorus: 3.7 mg/dL (ref 2.5–4.6)

## 2019-06-09 LAB — MAGNESIUM: Magnesium: 1.6 mg/dL — ABNORMAL LOW (ref 1.7–2.4)

## 2019-06-09 MED ORDER — POTASSIUM CHLORIDE 10 MEQ/50ML IV SOLN: 10.0000 meq | INTRAVENOUS | Status: DC

## 2019-06-09 MED ORDER — POTASSIUM CHLORIDE 10 MEQ/50ML IV SOLN
10.0000 meq | INTRAVENOUS | Status: DC
  Administered 2019-06-09: 10 meq via INTRAVENOUS
  Filled 2019-06-09: qty 50

## 2019-06-09 MED ORDER — POTASSIUM CHLORIDE 10 MEQ/50ML IV SOLN: 10.0000 meq | Freq: Once | INTRAVENOUS | Status: DC

## 2019-06-09 NOTE — Progress Notes (Signed)
Patient ID: [name], female   DOB: June 09, 1950, 69 y.o.   MRN: [id number]    4 Days Post-Op  Subjective: Patient feels better this morning.  Eating some more of her breakfast with no nausea or vomiting.  Her abdominal distention has improved as well.  She is passing flatus.  No BM since her diarrhea yet.  Potassium irritates her esophagitis.  ROS: See above, otherwise other systems negative  Objective: Vital signs in last 24 hours: Temp:  [97.5 F (36.4 C)-97.7 F (36.5 C)] 97.5 F (36.4 C) (02/12 2340) Pulse Rate:  [70-74] 74 (02/12 2340) Resp:  [14-20] 15 (02/12 2340) BP: (99-115)/(62-70) 99/62 (02/12 2340) SpO2:  [95 %-98 %] 98 % (02/12 2340) Last BM Date: 06/04/19  Intake/Output from previous day: 02/12 0701 - 02/13 0700 In: 1201.8 [P.O.:230; I.V.:971.8] Out: 615 [Urine:575; Chest Tube:40] Intake/Output this shift: No intake/output data recorded.  PE: Gen: NAD, eating breakfast Heart: regular Lungs: CTAB, R chest tube in place Abd: soft, less distended than yesterday, +BS, relatively nontender today  Lab Results:  No results for input(s): WBC, HGB, HCT, PLT in the last 72 hours. BMET Recent Labs    06/08/19 1226 06/09/19 0500  NA 131* 132*  K 3.9 3.6  CL 86* 90*  CO2 38* 35*  GLUCOSE 126* 128*  BUN <5* <5*  CREATININE 0.47 0.35*  CALCIUM 8.6* 8.4*   PT/INR No results for input(s): LABPROT, INR in the last 72 hours. CMP     Component Value Date/Time   NA 132 (L) 06/09/2019 0500   K 3.6 06/09/2019 0500   CL 90 (L) 06/09/2019 0500   CO2 35 (H) 06/09/2019 0500   GLUCOSE 128 (H) 06/09/2019 0500   BUN <5 (L) 06/09/2019 0500   CREATININE 0.35 (L) 06/09/2019 0500   CALCIUM 8.4 (L) 06/09/2019 0500   PROT 5.7 (L) 05/30/2019 0414   ALBUMIN 3.3 (L) 05/30/2019 0414   AST 21 05/30/2019 0414   ALT 14 05/30/2019 0414   ALKPHOS 40 05/30/2019 0414   BILITOT 0.8 05/30/2019 0414   GFRNONAA >60 06/09/2019 0500   GFRAA >60 06/09/2019 0500   Lipase      Component Value Date/Time   LIPASE 30 06/28/2010 2340       Studies/Results: DG Chest 2 View  Result Date: 06/08/2019 CLINICAL DATA:  Evaluate chest tube. History of right upper lobectomy. EXAM: CHEST - 2 VIEW COMPARISON:  06/07/2019 FINDINGS: Right pigtail chest tube is stable in position. There continues to be a right apical pneumothorax which is mildly complex and unchanged. Stable densities in the right hilum with postoperative changes. Stable volume loss in the right hemithorax. Few densities at the left lung base and suspect atelectasis and small left pleural effusion. Atherosclerotic calcifications at the aortic arch. Stable appearance of the heart and mediastinum. IMPRESSION: 1. Stable appearance of the right apical pneumothorax. Stable position of the right chest tube. 2. Atelectasis and probable small effusion at the left lung base. 3. Postsurgical changes and volume loss in the right hemithorax. Electronically Signed   By: Markus Daft [name]   On: 06/08/2019 08:45   DG Chest 1V REPEAT Same Day  Result Date: 06/07/2019 CLINICAL DATA:  Post lobectomy EXAM: CHEST - 1 VIEW SAME DAY COMPARISON:  Portable exam at 1233 hrs compared to 0629 hrs FINDINGS: Pigtail RIGHT thoracostomy tube again identified. Normal heart size, mediastinal contours, and pulmonary vascularity. Atherosclerotic calcification aorta. Postoperative changes with staple line and atelectasis in RIGHT perihilar region and RIGHT  upper lobe. Persistent RIGHT apex pneumothorax little changed in size since prior study. At underlying emphysematous changes with mild atelectasis and effusion at LEFT base again seen. Bones demineralized. IMPRESSION: Persistent RIGHT apex pneumothorax despite RIGHT thoracostomy tube. Changes of COPD with postoperative changes in the RIGHT upper lobe. Subsegmental atelectasis and small pleural effusion at LEFT base. Electronically Signed   By: Lavonia Dana [name]   On: 06/07/2019 12:49   DG Abd Portable  1V  Result Date: 06/08/2019 CLINICAL DATA:  Ileus. EXAM: PORTABLE ABDOMEN - 1 VIEW COMPARISON:  06/03/2019 and chest radiograph 06/08/2019 FINDINGS: Partial visualization of the right basilar chest tube. Multiple round densities overlying the left upper abdomen are probably external to the patient. There is gas within small and large bowel loops. Persistent dilated small bowel in the left lower abdomen measuring up to 4.1 cm, previously measured 3.2 cm. Increased bowel gas distension in the pelvis. There is gas in the rectum. Evidence for cholecystectomy clips in the right upper quadrant of the abdomen. IMPRESSION: Slightly increased small bowel gas distension in the lower abdomen and pelvis. However, there is evidence for gas in the colon and rectum. Findings are compatible with an ileus. Well-circumscribed round densities overlying the left upper abdomen are likely external to the patient. Electronically Signed   By: Markus Daft [name]   On: 06/08/2019 09:17   ECHOCARDIOGRAM COMPLETE  Result Date: 06/07/2019    ECHOCARDIOGRAM REPORT   Patient Name:   [name] Date of Exam: 06/07/2019 Medical Rec #:  [id number]       Height:       64.0 in Accession #:    [id number]      Weight:       108.0 lb Date of Birth:  [birth year]       BSA:          1.51 m Patient Age:    25 years        BP:           119/70 mmHg Patient Gender: F               HR:           71 bpm. Exam Location:  Inpatient Procedure: 2D Echo, Cardiac Doppler and Color Doppler Indications:    R00.0 Tachycardia  History:        Patient has no prior history of Echocardiogram examinations.                 Risk Factors:Hypertension, Dyslipidemia, Current Smoker and                 Sleep Apnea.  Sonographer:    Jonelle Sidle Dance Referring Phys: [id number] [place]  1. Left ventricular ejection fraction, by estimation, is 55 to 60%. The left ventricle has normal function. The left ventrical has no regional wall motion abnormalities. Left  ventricular diastolic parameters are consistent with Grade I diastolic dysfunction (impaired relaxation).  2. Right ventricular systolic function is normal. The right ventricular size is normal.  3. The mitral valve is normal in structure and function. no evidence of mitral valve regurgitation. No evidence of mitral stenosis.  4. The aortic valve is normal in structure and function. Aortic valve regurgitation is not visualized. No aortic stenosis is present.  5. Aortic dilatation noted. There is moderate dilatation of the ascending aorta measuring 39 mm. FINDINGS  Left Ventricle: Left ventricular ejection fraction, by estimation, is 55 to 60%. The left ventricle  has normal function. The left ventricle has no regional wall motion abnormalities. There is no left ventricular hypertrophy. Left ventricular diastolic parameters are consistent with Grade I diastolic dysfunction (impaired relaxation). Right Ventricle: The right ventricular size is normal. No increase in right ventricular wall thickness. Right ventricular systolic function is normal. Left Atrium: Left atrial size was normal in size. Right Atrium: Right atrial size was normal in size. Pericardium: There is no evidence of pericardial effusion. Mitral Valve: The mitral valve is normal in structure and function. No evidence of mitral valve regurgitation. No evidence of mitral valve stenosis. Tricuspid Valve: The tricuspid valve is grossly normal. Tricuspid valve regurgitation is trivial. Aortic Valve: The aortic valve is normal in structure and function. Aortic valve regurgitation is not visualized. No aortic stenosis is present. Pulmonic Valve: The pulmonic valve was grossly normal. Pulmonic valve regurgitation is trivial. Aorta: The aortic root, ascending aorta and aortic arch are all structurally normal, with no evidence of dilitation or obstruction and aortic dilatation noted. There is moderate dilatation of the ascending aorta measuring 39 mm. IAS/Shunts:  The atrial septum is grossly normal.  LEFT VENTRICLE PLAX 2D LVIDd:         4.00 cm  Diastology LVIDs:         3.50 cm  LV e' lateral:   7.51 cm/s LV PW:         0.80 cm  LV E/e' lateral: 8.0 LV IVS:        0.90 cm  LV e' medial:    4.68 cm/s LVOT diam:     2.10 cm  LV E/e' medial:  12.8 LV SV:         58.88 ml LV SV Index:   12.92 LVOT Area:     3.46 cm  RIGHT VENTRICLE             IVC RV Basal diam:  2.40 cm     IVC diam: 1.10 cm RV S prime:     14.10 cm/s TAPSE (M-mode): 1.8 cm LEFT ATRIUM             Index       RIGHT ATRIUM          Index LA diam:        2.20 cm 1.46 cm/m  RA Area:     9.27 cm LA Vol (A2C):   16.2 ml 10.76 ml/m RA Volume:   16.00 ml 10.63 ml/m LA Vol (A4C):   22.8 ml 15.14 ml/m LA Biplane Vol: 19.3 ml 12.82 ml/m  AORTIC VALVE LVOT Vmax:   82.50 cm/s LVOT Vmean:  46.900 cm/s LVOT VTI:    0.170 m  AORTA Ao Root diam: 3.30 cm Ao Asc diam:  3.90 cm MITRAL VALVE MV Area (PHT): 2.69 cm             SHUNTS MV Decel Time: 282 msec             Systemic VTI:  0.17 m MV E velocity: 60.00 cm/s 103 cm/s  Systemic Diam: 2.10 cm MV A velocity: 93.40 cm/s 70.3 cm/s MV E/A ratio:  0.64       1.5 Mertie Moores MD Electronically signed by Mertie Moores MD Signature Date/Time: 06/07/2019/4:57:56 PM    Final    [place] EKG SITE RITE  Result Date: 06/08/2019 If Site Rite image not attached, placement could not be confirmed due to current cardiac rhythm.   Anti-infectives: Anti-infectives (From admission, onward)   Start  Dose/Rate Route Frequency Ordered Stop   05/30/19 1000  fluconazole (DIFLUCAN) tablet 100 mg     100 mg Oral Daily 05/30/19 0820 06/03/19 0823   05/28/19 1600  ceFAZolin (ANCEF) IVPB 2g/100 mL premix     2 g 200 mL/hr over 30 Minutes Intravenous Every 8 hours 05/28/19 1522 05/29/19 0032   05/28/19 0548  ceFAZolin (ANCEF) IVPB 2g/100 mL premix     2 g 200 mL/hr over 30 Minutes Intravenous 30 min pre-op 05/28/19 0548 05/28/19 0755       Assessment/Plan Lung nodule, s/p RUL  lobectomy NSVT, improved  Ileus with significant electrolyte abnormalities (hypokalemia, hyponatremia, hypomagnesemia, hypochloridemia)  -overall improving.  Some electrolyte abnormalities noted still, but on replacements.  K is 3.6 this morning.  Probably needs some more but patient doesn't want pill or elixir.  That leaves IV, which will burn too. -she is taking in oral nutrition and her distention is improved.  She is passing flatus. -she seems to be improving overall.  Would continue current management.   FEN - regular diet VTE - Lovenox ID - none   LOS: 12 days    Henreitta Cea , [facility] 06/09/2019, 7:43 AM Please see Amion for pager number during day hours 7:00am-4:30pm or 7:00am -11:30am on weekends

## 2019-06-09 NOTE — Progress Notes (Addendum)
      [name]       [place] [address]             [phone]       4 Days Post-Op Procedure(s) (LRB): ESOPHAGOGASTRODUODENOSCOPY (EGD) (Left) BIOPSY  Subjective: Patient states swallowing is less painful! Her belly also "is not as swollen or hurting as much".  Objective: Vital signs in last 24 hours: Temp:  [97.5 F (36.4 C)-98 F (36.7 C)] 98 F (36.7 C) (02/13 0730) Pulse Rate:  [73-80] 80 (02/13 0730) Cardiac Rhythm: Normal sinus rhythm (02/13 0730) Resp:  [14-19] 19 (02/13 0730) BP: (99-115)/(62-72) 106/72 (02/13 0730) SpO2:  [98 %] 98 % (02/13 0730)     Intake/Output from previous day: 02/12 0701 - 02/13 0700 In: 1201.8 [P.O.:230; I.V.:971.8] Out: 615 [Urine:575; Chest Tube:40]   Physical Exam:  Cardiovascular: RRR Pulmonary: Clear on the left and diminished on the right Abdomen: Soft, slightly distended, non tender, bowel sounds present. Extremities: No lower extremity edema. Wounds: Sero sanguinous drainage from chest tube. Chest Tubes: to water seal, no air leak seen this am  Lab Results: CBC: No results for input(s): WBC, HGB, HCT, PLT in the last 72 hours. BMET:  Recent Labs    06/08/19 1226 06/09/19 0500  NA 131* 132*  K 3.9 3.6  CL 86* 90*  CO2 38* 35*  GLUCOSE 126* 128*  BUN <5* <5*  CREATININE 0.47 0.35*  CALCIUM 8.6* 8.4*    PT/INR: No results for input(s): LABPROT, INR in the last 72 hours. ABG:  INR: Will add last result for INR, ABG once components are confirmed Will add last 4 CBG results once components are confirmed  Assessment/Plan:  1. CV - Previous wide complex tachycardia. Electrolyte abnormalities supplemented and improved. Per cardiology, If further VT once electrolytes repleted, can consider ischemia evaluation, as she does have multivessel coronary calcifications on CT chest.  However, currently being treated for esophagitis/gastric ulcers so is likely not a candidate for antiplatelet therapy at  this time.On Amlodipine 5 mg daily, and Toprol Xl 25 mg daily.  2.  Pulmonary - On 3 liters of oxygen via [place]. Will wean over next few days. Chest tubes with 40 cc last 24 hours. Chest tube is to water seal and I do not see an air leak this am. CXR this am appears to show stable right apical pneumothorax . May clamp chest tube in am. Encourage incentive spirometer.  3. GI- esophagitis, gastric ulcer. Continue magic mouth wasth, Carafate and Protonix.  Also, with ileus. KUB appears stable. Abdominal distention appears improved, passing flatus. Oral intake slowly improving. Decrease IVF to 100 cc/hr. Speech pathology recommendations noted 4. Regarding pain control, on Fentanyl patch. Has a history of drug abuse so will limit narcotics as much as possible. Tramadol does not alleviate pain. 5. Hypokalemia-potassium improved to 3.6 Of note, patient unable to take oral or solution and do not want to give potassium via peripheral PICC. Monitor  Sharalyn Ink ZimmermanPA-C 06/09/2019,8:54 AM [phone]  Patient examined and images of today's chest x-ray and KUB personally reviewed Mild right apical pneumothorax on chest x-ray with pigtail catheter in place-airleak appears improved Mild small bowel ileus on KUB Overall patient feeling better with better oral intake No cardiac arrhythmias Continue cares outlined above  patient examined and medical record reviewed,agree with above note. Tharon Aquas Trigt III 06/09/2019

## 2019-06-09 NOTE — Progress Notes (Addendum)
Progress Note  Patient Name: [name] Date of Encounter: 06/09/2019  Primary Cardiologist: Pixie Casino, MD   Subjective   No acute overnight events. Feeling better today than the past several days. Breathing OK. No chest pain. No palpitations.  Inpatient Medications    Scheduled Meds: . amLODipine  5 mg Oral Daily  . aspirin EC  81 mg Oral Daily  . calcium carbonate  1 tablet Oral BID  . Chlorhexidine Gluconate Cloth  6 each Topical Daily  . enoxaparin (LOVENOX) injection  40 mg Subcutaneous Daily  . fentaNYL  1 patch Transdermal Q72H  . folic acid  1 mg Oral Daily  . magnesium oxide  400 mg Oral BID  . metoprolol succinate  25 mg Oral Daily  . montelukast  10 mg Oral QHS  . nicotine  21 mg Transdermal Daily  . pantoprazole  40 mg Oral BID  . polyethylene glycol  17 g Oral Daily  . potassium chloride  40 mEq Oral Once  . rosuvastatin  10 mg Oral Daily  . senna-docusate  1 tablet Oral QHS  . sodium chloride flush  10-40 mL Intracatheter Q12H  . sodium chloride flush  10-40 mL Intracatheter Q12H  . sucralfate  1 g Oral TID WC & HS   Continuous Infusions: . dextrose 5 % and 0.45 % NaCl with KCl 40 mEq/L 125 mL/hr at 06/09/19 0547   PRN Meds: albuterol, ALPRAZolam, alum & mag hydroxide-simeth, cholestyramine, diphenhydrAMINE, guaiFENesin, magic mouthwash, naloxone, ondansetron (ZOFRAN) IV, promethazine, sodium chloride flush, sodium chloride flush, sodium chloride flush, traMADol   Vital Signs    Vitals:   06/08/19 0846 06/08/19 2001 06/08/19 2340 06/09/19 0730  BP: 112/62 115/70 99/62 106/72  Pulse: 70 73 74 80  Resp: 20 14 15 19   Temp:  97.7 F (36.5 C) (!) 97.5 F (36.4 C) 98 F (36.7 C)  TempSrc:  Oral Oral Oral  SpO2: 95% 98% 98% 98%  Weight:      Height:        Intake/Output Summary (Last 24 hours) at 06/09/2019 0843 Last data filed at 06/09/2019 0400 Gross per 24 hour  Intake 1201.79 ml  Output 615 ml  Net 586.79 ml   Last 3 Weights  05/28/2019 05/24/2019 05/09/2019  Weight (lbs) 108 lb 108 lb 106 lb  Weight (kg) 48.988 kg 48.988 kg 48.081 kg      Telemetry    Sinus rhythm with rates in the 60's to 80's. Few very brief episodes of sinus tachycardia with rates in the 130's. - Personally Reviewed  ECG    No new ECG tracing today. - Personally Reviewed  Physical Exam   GEN: No acute distress.   Neck: Supple. No JVD Cardiac: RRR. No murmurs, rubs, or gallops.  Respiratory: Clear to auscultation bilaterally. GI: Soft, mildly distended, and mildly tender to palpation. Bowel sounds present. MS: No edema. No deformity. Skin: Warm and dry. Neuro:  No focal deficits. Psych: Normal affect.  Labs    High Sensitivity Troponin:  No results for input(s): TROPONINIHS in the last 720 hours.    Chemistry Recent Labs  Lab 06/08/19 0116 06/08/19 1226 06/09/19 0500  NA 130* 131* 132*  K 2.3* 3.9 3.6  CL 79* 86* 90*  CO2 41* 38* 35*  GLUCOSE 115* 126* 128*  BUN <5* <5* <5*  CREATININE 0.45 0.47 0.35*  CALCIUM 8.9 8.6* 8.4*  GFRNONAA >60 >60 >60  GFRAA >60 >60 >60  ANIONGAP 10 7 7  Hematology Recent Labs  Lab 06/03/19 0415 06/04/19 0306  WBC 11.5* 10.4  RBC 3.11* 2.99*  HGB 10.4* 10.1*  HCT 31.8* 30.0*  MCV 102.3* 100.3*  MCH 33.4 33.8  MCHC 32.7 33.7  RDW 13.2 13.0  PLT 253 263    BNPNo results for input(s): BNP, PROBNP in the last 168 hours.   DDimer No results for input(s): DDIMER in the last 168 hours.   Radiology    DG Chest 2 View  Result Date: 06/08/2019 CLINICAL DATA:  Evaluate chest tube. History of right upper lobectomy. EXAM: CHEST - 2 VIEW COMPARISON:  06/07/2019 FINDINGS: Right pigtail chest tube is stable in position. There continues to be a right apical pneumothorax which is mildly complex and unchanged. Stable densities in the right hilum with postoperative changes. Stable volume loss in the right hemithorax. Few densities at the left lung base and suspect atelectasis and small left  pleural effusion. Atherosclerotic calcifications at the aortic arch. Stable appearance of the heart and mediastinum. IMPRESSION: 1. Stable appearance of the right apical pneumothorax. Stable position of the right chest tube. 2. Atelectasis and probable small effusion at the left lung base. 3. Postsurgical changes and volume loss in the right hemithorax. Electronically Signed   By: Markus Daft [name]   On: 06/08/2019 08:45   DG Chest 1V REPEAT Same Day  Result Date: 06/07/2019 CLINICAL DATA:  Post lobectomy EXAM: CHEST - 1 VIEW SAME DAY COMPARISON:  Portable exam at 1233 hrs compared to 0629 hrs FINDINGS: Pigtail RIGHT thoracostomy tube again identified. Normal heart size, mediastinal contours, and pulmonary vascularity. Atherosclerotic calcification aorta. Postoperative changes with staple line and atelectasis in RIGHT perihilar region and RIGHT upper lobe. Persistent RIGHT apex pneumothorax little changed in size since prior study. At underlying emphysematous changes with mild atelectasis and effusion at LEFT base again seen. Bones demineralized. IMPRESSION: Persistent RIGHT apex pneumothorax despite RIGHT thoracostomy tube. Changes of COPD with postoperative changes in the RIGHT upper lobe. Subsegmental atelectasis and small pleural effusion at LEFT base. Electronically Signed   By: Lavonia Dana [name]   On: 06/07/2019 12:49   DG Abd Portable 1V  Result Date: 06/08/2019 CLINICAL DATA:  Ileus. EXAM: PORTABLE ABDOMEN - 1 VIEW COMPARISON:  06/03/2019 and chest radiograph 06/08/2019 FINDINGS: Partial visualization of the right basilar chest tube. Multiple round densities overlying the left upper abdomen are probably external to the patient. There is gas within small and large bowel loops. Persistent dilated small bowel in the left lower abdomen measuring up to 4.1 cm, previously measured 3.2 cm. Increased bowel gas distension in the pelvis. There is gas in the rectum. Evidence for cholecystectomy clips in the right  upper quadrant of the abdomen. IMPRESSION: Slightly increased small bowel gas distension in the lower abdomen and pelvis. However, there is evidence for gas in the colon and rectum. Findings are compatible with an ileus. Well-circumscribed round densities overlying the left upper abdomen are likely external to the patient. Electronically Signed   By: Markus Daft [name]   On: 06/08/2019 09:17   ECHOCARDIOGRAM COMPLETE  Result Date: 06/07/2019    ECHOCARDIOGRAM REPORT   Patient Name:   [name] Date of Exam: 06/07/2019 Medical Rec #:  [id number]       Height:       64.0 in Accession #:    [id number]      Weight:       108.0 lb Date of Birth:  04/04/1951  BSA:          1.51 m Patient Age:    47 years        BP:           119/70 mmHg Patient Gender: F               HR:           71 bpm. Exam Location:  Inpatient Procedure: 2D Echo, Cardiac Doppler and Color Doppler Indications:    R00.0 Tachycardia  History:        Patient has no prior history of Echocardiogram examinations.                 Risk Factors:Hypertension, Dyslipidemia, Current Smoker and                 Sleep Apnea.  Sonographer:    Jonelle Sidle Dance Referring Phys: [id number] [place]  1. Left ventricular ejection fraction, by estimation, is 55 to 60%. The left ventricle has normal function. The left ventrical has no regional wall motion abnormalities. Left ventricular diastolic parameters are consistent with Grade I diastolic dysfunction (impaired relaxation).  2. Right ventricular systolic function is normal. The right ventricular size is normal.  3. The mitral valve is normal in structure and function. no evidence of mitral valve regurgitation. No evidence of mitral stenosis.  4. The aortic valve is normal in structure and function. Aortic valve regurgitation is not visualized. No aortic stenosis is present.  5. Aortic dilatation noted. There is moderate dilatation of the ascending aorta measuring 39 mm. FINDINGS  Left Ventricle:  Left ventricular ejection fraction, by estimation, is 55 to 60%. The left ventricle has normal function. The left ventricle has no regional wall motion abnormalities. There is no left ventricular hypertrophy. Left ventricular diastolic parameters are consistent with Grade I diastolic dysfunction (impaired relaxation). Right Ventricle: The right ventricular size is normal. No increase in right ventricular wall thickness. Right ventricular systolic function is normal. Left Atrium: Left atrial size was normal in size. Right Atrium: Right atrial size was normal in size. Pericardium: There is no evidence of pericardial effusion. Mitral Valve: The mitral valve is normal in structure and function. No evidence of mitral valve regurgitation. No evidence of mitral valve stenosis. Tricuspid Valve: The tricuspid valve is grossly normal. Tricuspid valve regurgitation is trivial. Aortic Valve: The aortic valve is normal in structure and function. Aortic valve regurgitation is not visualized. No aortic stenosis is present. Pulmonic Valve: The pulmonic valve was grossly normal. Pulmonic valve regurgitation is trivial. Aorta: The aortic root, ascending aorta and aortic arch are all structurally normal, with no evidence of dilitation or obstruction and aortic dilatation noted. There is moderate dilatation of the ascending aorta measuring 39 mm. IAS/Shunts: The atrial septum is grossly normal.  LEFT VENTRICLE PLAX 2D LVIDd:         4.00 cm  Diastology LVIDs:         3.50 cm  LV e' lateral:   7.51 cm/s LV PW:         0.80 cm  LV E/e' lateral: 8.0 LV IVS:        0.90 cm  LV e' medial:    4.68 cm/s LVOT diam:     2.10 cm  LV E/e' medial:  12.8 LV SV:         58.88 ml LV SV Index:   12.92 LVOT Area:     3.46 cm  RIGHT VENTRICLE  IVC RV Basal diam:  2.40 cm     IVC diam: 1.10 cm RV S prime:     14.10 cm/s TAPSE (M-mode): 1.8 cm LEFT ATRIUM             Index       RIGHT ATRIUM          Index LA diam:        2.20 cm 1.46 cm/m   RA Area:     9.27 cm LA Vol (A2C):   16.2 ml 10.76 ml/m RA Volume:   16.00 ml 10.63 ml/m LA Vol (A4C):   22.8 ml 15.14 ml/m LA Biplane Vol: 19.3 ml 12.82 ml/m  AORTIC VALVE LVOT Vmax:   82.50 cm/s LVOT Vmean:  46.900 cm/s LVOT VTI:    0.170 m  AORTA Ao Root diam: 3.30 cm Ao Asc diam:  3.90 cm MITRAL VALVE MV Area (PHT): 2.69 cm             SHUNTS MV Decel Time: 282 msec             Systemic VTI:  0.17 m MV E velocity: 60.00 cm/s 103 cm/s  Systemic Diam: 2.10 cm MV A velocity: 93.40 cm/s 70.3 cm/s MV E/A ratio:  0.64       1.5 Mertie Moores MD Electronically signed by Mertie Moores MD Signature Date/Time: 06/07/2019/4:57:56 PM    Final    [place] EKG SITE RITE  Result Date: 06/08/2019 If Site Rite image not attached, placement could not be confirmed due to current cardiac rhythm.   Cardiac Studies   Echocardiogram 06/07/2019: Impressions: 1. Left ventricular ejection fraction, by estimation, is 55 to 60%. The  left ventricle has normal function. The left ventrical has no regional  wall motion abnormalities. Left ventricular diastolic parameters are  consistent with Grade I diastolic  dysfunction (impaired relaxation).  2. Right ventricular systolic function is normal. The right ventricular  size is normal.  3. The mitral valve is normal in structure and function. no evidence of  mitral valve regurgitation. No evidence of mitral stenosis.  4. The aortic valve is normal in structure and function. Aortic valve  regurgitation is not visualized. No aortic stenosis is present.  5. Aortic dilatation noted. There is moderate dilatation of the ascending  aorta measuring 39 mm.  Patient Profile     69 y.o. 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 Hospitalization complicated by  esophagitis and gastric ulcerations. Cardiology asked to see for evaluation of wide complex tachycardia.   Assessment & Plan    Non-Sustained VT - Patient had 22 beat run (6 seconds) of wide complex tachycardia during ambulation on 2/11 consistent with NSVT. Felt to be secondary to hypokalemia with potassium of 2.6.  - Telemetry shows no recurrence of NSVT in the last 24 hours. - Potassium 3.6 today. Being supplemented. Keep above >4.0.  - Magnesium 1.7 today. Being supplemented. Keep above >2.0.  - Patient states she was told she could stop Metoprolol yesterday but do not see this in the note. Will talk with MD. - Consider outpatient ischemia evaluation once GI issues have resolved.   Complete Heart Block - Episode lasted <1 minute on 2/10. Patient was awake and asymptomatic at the time.  - EP consulted and was concerned that arrhythmias may  be ischemic in nature given advanced 3 vessel CAD on recent CT scan on 04/16/2020. They recommended ischemic work-up; however, patient not interested at this time.   Otherwise, per primary team.   For questions or updates, please contact [place] Please consult [email or web address] for contact info under        Signed, Darreld Mclean, PA-C  06/09/2019, 8:43 AM    I have seen and examined the patient along with Darreld Mclean, PA-C .  I have reviewed the chart, notes and new data.  I agree with PA/NP's note.  Key new complaints: no cardiac complaints Key examination changes: no clinical signs of CHF Key new findings / data: no VT or AV block seen on telemetry  PLAN: Continue low dose beta blockers, avoid QT prolonging agents, carefully maintain K and Mg in normal range, low dose beta blocker. Coronary CT angio may be non diagnostic due to heavy calcifications. Further diagnostic options are conventional cardiac cath w coronary angio or a follow up nuclear perfusion study (normal in 2017). She is not interested in either study at this  time. Will continue to monitor telemetry while an inpatient, but otherwise will sign off for now. [facility] will sign off.   Medication Recommendations:  metoprolol succinate 25 mg daily, amlodipine 5 mg daily.  Stop the thiazide diuretic. Can restart telmisartan 80 mg daily, once BP increases. ASA 81 mg daily, rosuvastatin 10 mg daily. Other recommendations (labs, testing, etc):  Outpatient nuclear perfusion study Follow up as an outpatient:  Please let us know when she is ready for DC to arrange follow up.   Sanda Klein, MD, [place] [phone] 06/09/2019, 10:48 AM

## 2019-06-10 ENCOUNTER — Inpatient Hospital Stay (HOSPITAL_COMMUNITY): Payer: Medicare PPO

## 2019-06-10 LAB — BASIC METABOLIC PANEL
Anion gap: 6 (ref 5–15)
BUN: <5 mg/dL — ABNORMAL LOW (ref 8–23)
CO2: 28 mmol/L (ref 22–32)
Calcium: 8.4 mg/dL — ABNORMAL LOW (ref 8.9–10.3)
Chloride: 93 mmol/L — ABNORMAL LOW (ref 98–111)
Creatinine, Ser: 0.56 mg/dL (ref 0.44–1.00)
GFR calc Af Amer: >60 mL/min (ref >60)
GFR calc non Af Amer: >60 mL/min (ref >60)
Glucose, Bld: 114 mg/dL — ABNORMAL HIGH (ref 70–99)
Potassium: 4.7 mmol/L (ref 3.5–5.1)
Sodium: 127 mmol/L — ABNORMAL LOW (ref 135–145)

## 2019-06-10 MED ORDER — DEXTROSE-NACL 5-0.9 % IV SOLN: INTRAVENOUS | Status: DC

## 2019-06-10 NOTE — Progress Notes (Addendum)
      [address]       [place] [address]             [phone]       5 Days Post-Op Procedure(s) (LRB): ESOPHAGOGASTRODUODENOSCOPY (EGD) (Left) BIOPSY  Subjective: Patient states swallowing continues to improve. Her belly is "so so" this am. She denies nausea or vomiting, passing flatus.  Objective: Vital signs in last 24 hours: Temp:  [97.8 F (36.6 C)-98.3 F (36.8 C)] 98 F (36.7 C) (02/14 0736) Pulse Rate:  [80-86] 86 (02/14 0736) Cardiac Rhythm: Normal sinus rhythm (02/14 0700) Resp:  [14-19] 14 (02/14 0736) BP: (103-131)/(67-82) 125/77 (02/14 0736) SpO2:  [96 %-100 %] 96 % (02/14 0736)     Intake/Output from previous day: 02/13 0701 - 02/14 0700 In: 3111.8 [P.O.:660; I.V.:2451.8] Out: 1870 [Urine:1850; Chest Tube:20]   Physical Exam:  Cardiovascular: RRR Pulmonary: Clear on the left and diminished at the right apex Abdomen: Soft, slightly distended, non tender, bowel sounds present. Extremities: No lower extremity edema. Wounds: Sero sanguinous drainage from chest tube. Chest Tubes: to water seal, no air leak seen again this am  Lab Results: CBC: No results for input(s): WBC, HGB, HCT, PLT in the last 72 hours. BMET:  Recent Labs    06/09/19 0500 06/10/19 0610  NA 132* 127*  K 3.6 4.7  CL 90* 93*  CO2 35* 28  GLUCOSE 128* 114*  BUN <5* <5*  CREATININE 0.35* 0.56  CALCIUM 8.4* 8.4*    PT/INR: No results for input(s): LABPROT, INR in the last 72 hours. ABG:  INR: Will add last result for INR, ABG once components are confirmed Will add last 4 CBG results once components are confirmed  Assessment/Plan:  1. CV - Previous wide complex tachycardia, CHB (last less than 1 minute) and EP consulted and was concerned that arrhythmias may be ischemic in nature given advanced 3 vessel CAD on recent CT scan on 04/16/2020. EP recommended ischemic work-up; however, patient not interested at this time. Electrolyte abnormalities  supplemented and improved. On Amlodipine 5 mg daily, and Toprol Xl 25 mg daily.  2.  Pulmonary - On 2 liters of oxygen via [place]. Chest tubes with 40 cc last 24 hours. Chest tube is to water seal and I do not see an air leak again this am. CXR this am appears to show slight increase in right pneumothorax. Encourage incentive spirometer.  3. GI- esophagitis, gastric ulcer. Continue magic mouth wasth, Carafate and Protonix.  Also, with ileus that is resolving. Oral intake slowly improving. Decrease IVF to  75 ml/hr. Speech pathology recommendations noted 4. Regarding pain control, on Fentanyl patch. Has a history of drug abuse so will limit narcotics as much as possible. Tramadol does not alleviate pain. 5. Hypokalemia-potassium improved to 4.7. She also has hyponatremia-sodium 127. Will change IVF and monitor  Donielle M ZimmermanPA-C 06/10/2019,8:29 AM [phone]  CXR today with stable small R apical space on water seal No airleak with cough Pigtail catheter closed for 2 hours then opened w/o any air bubbles thru pleurovac  patient examined and medical record reviewed,agree with above note. Tharon Aquas Trigt III 06/10/2019

## 2019-06-10 NOTE — Progress Notes (Signed)
Patient ID: [name], female   DOB: 03-21-1951, 69 y.o.   MRN: [id number]    5 Days Post-Op  Subjective: Patient feels better this morning.  Tolerating solid diet.  Passing flatus.  Drank her miralax last night.  No nausea.  ROS: See above, otherwise other systems negative  Objective: Vital signs in last 24 hours: Temp:  [97.8 F (36.6 C)-98.3 F (36.8 C)] 98 F (36.7 C) (02/14 0736) Pulse Rate:  [80-86] 86 (02/14 0736) Resp:  [14-19] 14 (02/14 0736) BP: (103-131)/(67-82) 125/77 (02/14 0736) SpO2:  [96 %-100 %] 96 % (02/14 0736) Last BM Date: 06/04/19(offered senokot, patient refuses)  Intake/Output from previous day: 02/13 0701 - 02/14 0700 In: 3111.8 [P.O.:660; I.V.:2451.8] Out: 1870 [Urine:1850; Chest Tube:20] Intake/Output this shift: Total I/O In: -  Out: 600 [Urine:600]  PE: Heart: regular Lungs: CTAB, CT in place on right side Abd: soft, bloated, but still improving, +BS, NT  Lab Results:  No results for input(s): WBC, HGB, HCT, PLT in the last 72 hours. BMET Recent Labs    06/09/19 0500 06/10/19 0610  NA 132* 127*  K 3.6 4.7  CL 90* 93*  CO2 35* 28  GLUCOSE 128* 114*  BUN <5* <5*  CREATININE 0.35* 0.56  CALCIUM 8.4* 8.4*   PT/INR No results for input(s): LABPROT, INR in the last 72 hours. CMP     Component Value Date/Time   NA 127 (L) 06/10/2019 0610   K 4.7 06/10/2019 0610   CL 93 (L) 06/10/2019 0610   CO2 28 06/10/2019 0610   GLUCOSE 114 (H) 06/10/2019 0610   BUN <5 (L) 06/10/2019 0610   CREATININE 0.56 06/10/2019 0610   CALCIUM 8.4 (L) 06/10/2019 0610   PROT 5.7 (L) 05/30/2019 0414   ALBUMIN 3.3 (L) 05/30/2019 0414   AST 21 05/30/2019 0414   ALT 14 05/30/2019 0414   ALKPHOS 40 05/30/2019 0414   BILITOT 0.8 05/30/2019 0414   GFRNONAA >60 06/10/2019 0610   GFRAA >60 06/10/2019 0610   Lipase     Component Value Date/Time   LIPASE 30 06/28/2010 2340       Studies/Results: DG CHEST PORT 1 VIEW  Result Date:  06/09/2019 CLINICAL DATA:  Pneumothorax. EXAM: PORTABLE CHEST 1 VIEW COMPARISON:  06/08/2019 FINDINGS: Sequelae right upper lobectomy are again identified. A right PICC has been placed and terminates near the superior cavoatrial junction. A right chest tube remains in place. The cardiomediastinal silhouette is unchanged with normal heart size and asymmetric right hilar densities which may be postsurgical. A small right apical pneumothorax has minimally enlarged. Left basilar opacities are unchanged and likely reflect a small pleural effusion and atelectasis. IMPRESSION: 1. Minimally increased size of small right apical pneumothorax. 2. Unchanged left basilar atelectasis and small pleural effusion. Electronically Signed   By: Logan Bores [name]   On: 06/09/2019 10:18   DG Abd Portable 1V  Result Date: 06/09/2019 CLINICAL DATA:  Ileus. EXAM: PORTABLE ABDOMEN - 1 VIEW COMPARISON:  06/08/2019 FINDINGS: Gas is again seen in multiple loops of small and large bowel to the level of the rectum. Mildly dilated small bowel has not significantly changed. Right upper quadrant abdominal surgical clips and atherosclerotic vascular calcifications are noted. The lung bases are more fully evaluated on separate chest radiograph. IMPRESSION: Unchanged mild small bowel dilatation suggesting ileus. Electronically Signed   By: Logan Bores [name]   On: 06/09/2019 10:20   DG Abd Portable 1V  Result Date: 06/08/2019 CLINICAL DATA:  Ileus. EXAM:  PORTABLE ABDOMEN - 1 VIEW COMPARISON:  06/03/2019 and chest radiograph 06/08/2019 FINDINGS: Partial visualization of the right basilar chest tube. Multiple round densities overlying the left upper abdomen are probably external to the patient. There is gas within small and large bowel loops. Persistent dilated small bowel in the left lower abdomen measuring up to 4.1 cm, previously measured 3.2 cm. Increased bowel gas distension in the pelvis. There is gas in the rectum. Evidence for  cholecystectomy clips in the right upper quadrant of the abdomen. IMPRESSION: Slightly increased small bowel gas distension in the lower abdomen and pelvis. However, there is evidence for gas in the colon and rectum. Findings are compatible with an ileus. Well-circumscribed round densities overlying the left upper abdomen are likely external to the patient. Electronically Signed   By: Markus Daft [name]   On: 06/08/2019 09:17   [place] EKG SITE RITE  Result Date: 06/08/2019 If Site Rite image not attached, placement could not be confirmed due to current cardiac rhythm.   Anti-infectives: Anti-infectives (From admission, onward)   Start     Dose/Rate Route Frequency Ordered Stop   05/30/19 1000  fluconazole (DIFLUCAN) tablet 100 mg     100 mg Oral Daily 05/30/19 0820 06/03/19 0823   05/28/19 1600  ceFAZolin (ANCEF) IVPB 2g/100 mL premix     2 g 200 mL/hr over 30 Minutes Intravenous Every 8 hours 05/28/19 1522 05/29/19 0032   05/28/19 0548  ceFAZolin (ANCEF) IVPB 2g/100 mL premix     2 g 200 mL/hr over 30 Minutes Intravenous 30 min pre-op 05/28/19 0548 05/28/19 0755       Assessment/Plan Lung nodule, s/p RUL lobectomy NSVT, improved  Ileus  -K up to 4.7 this am -passing flatus and tolerating a solid diet -cont bowel regimen with miralax to avoid constipation and to keep things moving -mobilization -maintain electrolyte balance -no further surgical recommendations as patient is eating and passing flatus.  We will sign off.  Call if needed.    FEN -regular diet VTE -Lovenox ID -none   LOS: 13 days    Henreitta Cea , [facility] 06/10/2019, 7:41 AM Please see Amion for pager number during day hours 7:00am-4:30pm or 7:00am -11:30am on weekends

## 2019-06-11 ENCOUNTER — Inpatient Hospital Stay (HOSPITAL_COMMUNITY): Payer: Medicare PPO

## 2019-06-11 LAB — BASIC METABOLIC PANEL
Anion gap: 8 (ref 5–15)
BUN: <5 mg/dL — ABNORMAL LOW (ref 8–23)
CO2: 21 mmol/L — ABNORMAL LOW (ref 22–32)
Calcium: 6.3 mg/dL — CL (ref 8.9–10.3)
Chloride: 103 mmol/L (ref 98–111)
Creatinine, Ser: <0.3 mg/dL — ABNORMAL LOW (ref 0.44–1.00)
Glucose, Bld: 85 mg/dL (ref 70–99)
Potassium: 2.9 mmol/L — ABNORMAL LOW (ref 3.5–5.1)
Sodium: 132 mmol/L — ABNORMAL LOW (ref 135–145)

## 2019-06-11 LAB — ALBUMIN: Albumin: 1.3 g/dL — ABNORMAL LOW (ref 3.5–5.0)

## 2019-06-11 MED ORDER — POTASSIUM CHLORIDE CRYS ER 20 MEQ PO TBCR: 40.0000 meq | EXTENDED_RELEASE_TABLET | Freq: Two times a day (BID) | ORAL | Status: DC

## 2019-06-11 MED ORDER — POTASSIUM CHLORIDE 10 MEQ/100ML IV SOLN
10.0000 meq | INTRAVENOUS | Status: AC
  Administered 2019-06-11 (×5): 10 meq via INTRAVENOUS
  Filled 2019-06-11 (×5): qty 100

## 2019-06-11 MED ORDER — GERHARDT'S BUTT CREAM
TOPICAL_CREAM | Freq: Three times a day (TID) | CUTANEOUS | Status: DC
  Administered 2019-06-11: 1 via TOPICAL
  Filled 2019-06-11: qty 1

## 2019-06-11 NOTE — Progress Notes (Signed)
      [address]       [place], [address]             [phone]      CT clamped on rounds this AM  No issues symptomatically  I unclamped the tube and observed for several minutes, making sure it was not kinked. No air leak seen  Will dc CT  Hypokalemia- supplement IV  Remo Lipps C. Roxan Hockey, MD [facility] [phone]

## 2019-06-11 NOTE — Progress Notes (Addendum)
      [address]       [place] [address]             [phone]      6 Days Post-Op Procedure(s) (LRB): ESOPHAGOGASTRODUODENOSCOPY (EGD) (Left) BIOPSY   Subjective:  No new complaints.  States she is doing okay.  She states her belly is feeling better.  She feels like she needs to move her bowels.  She is passing gas, but hasn't been able to do much else.  Oral intake is improving as able.    Objective: Vital signs in last 24 hours: Temp:  [97.8 F (36.6 C)-98.6 F (37 C)] 98.1 F (36.7 C) (02/15 0721) Pulse Rate:  [79-88] 85 (02/15 0649) Cardiac Rhythm: Normal sinus rhythm (02/14 1910) Resp:  [13-22] 16 (02/15 0721) BP: (109-130)/(75-85) 111/84 (02/15 0721) SpO2:  [94 %-100 %] 94 % (02/15 0649)  Intake/Output from previous day: 02/14 0701 - 02/15 0700 In: 1949 [P.O.:720; I.V.:1229] Out: 2320 [Urine:2300; Chest Tube:20]  General appearance: alert, cooperative and no distress Heart: regular rate and rhythm Lungs: clear to auscultation bilaterally Abdomen: soft, non-tender, hypoactive BS Extremities: extremities normal, atraumatic, no cyanosis or edema Wound: clean and dry  Lab Results: No results for input(s): WBC, HGB, HCT, PLT in the last 72 hours. BMET:  Recent Labs    06/09/19 0500 06/10/19 0610  NA 132* 127*  K 3.6 4.7  CL 90* 93*  CO2 35* 28  GLUCOSE 128* 114*  BUN <5* <5*  CREATININE 0.35* 0.56  CALCIUM 8.4* 8.4*    PT/INR: No results for input(s): LABPROT, INR in the last 72 hours. ABG    Component Value Date/Time   PHART 7.468 (H) 05/29/2019 0423   HCO3 39.2 (H) 05/29/2019 0423   TCO2 30 05/28/2019 1115   ACIDBASEDEF 1.0 05/28/2019 1115   O2SAT 69.3 05/29/2019 0423   CBG (last 3)  No results for input(s): GLUCAP in the last 72 hours.  Assessment/Plan: S/P Procedure(s) (LRB): ESOPHAGOGASTRODUODENOSCOPY (EGD) (Left) BIOPSY  1. CV- no further NSVT, or CHB, maintaining NSR- on Toprol XL 2. Pulm- no acute issues, right  apical space remains stable, no air leak from chest tube--- can hopefully remove today, will defer to Dr. Roxan Hockey 3. Hypokalemia resolved, K is at 4.7, will continue low dose supplementation daily 4. Hyponatremia persists- level currently at 127, currently on NS at 73ml/hr 5.  GI- esophagitis- stable, oral intake improving as able, on Carafate, magic mouthwash, protonix 6. Pain control- on duragesic patch with tramadol prn 7. Dispo- patient stable, hypokalemia resolved, hyponatremia persists on NS, no air leak from chest tube site, can hopefully remove today, continue current care, repeat CXR in AM   LOS: 14 days    Ellwood Handler, PA-C  06/11/2019 patient seen and examined, agree with above Recheck BMET today Will clamp tube this morning and recheck after 1st case  Remo Lipps C. Roxan Hockey, MD [facility] [phone]

## 2019-06-11 NOTE — Plan of Care (Signed)

## 2019-06-11 NOTE — Progress Notes (Signed)
Chest tube removed without complication, patient tolerated well.

## 2019-06-11 NOTE — Progress Notes (Signed)
      [address]       [place] [address]             [phone]       Called with a critical value Calcium 6.3, potassium 2.9. Will replace potassium. Ordered an ionized calcium and albumin level. Will follow up. BMP tomorrow.    Nicholes Rough, PA-C

## 2019-06-11 NOTE — Progress Notes (Signed)
CRITICAL VALUE ALERT  Critical Value:  Calcium 6.3  Date & Time Notied:  06/11/19, 5521  Provider Notified: Harriet Pho  Orders Received/Actions taken: Awaiting orders, reviewing orders.

## 2019-06-12 ENCOUNTER — Inpatient Hospital Stay (HOSPITAL_COMMUNITY): Payer: Medicare PPO

## 2019-06-12 LAB — COMPREHENSIVE METABOLIC PANEL
ALT: 32 U/L (ref 0–44)
AST: 14 U/L — ABNORMAL LOW (ref 15–41)
Albumin: 1.8 g/dL — ABNORMAL LOW (ref 3.5–5.0)
Alkaline Phosphatase: 110 U/L (ref 38–126)
Anion gap: 9 (ref 5–15)
BUN: 7 mg/dL — ABNORMAL LOW (ref 8–23)
CO2: 29 mmol/L (ref 22–32)
Calcium: 8.5 mg/dL — ABNORMAL LOW (ref 8.9–10.3)
Chloride: 90 mmol/L — ABNORMAL LOW (ref 98–111)
Creatinine, Ser: 0.36 mg/dL — ABNORMAL LOW (ref 0.44–1.00)
GFR calc Af Amer: >60 mL/min (ref >60)
GFR calc non Af Amer: >60 mL/min (ref >60)
Glucose, Bld: 90 mg/dL (ref 70–99)
Potassium: 3.7 mmol/L (ref 3.5–5.1)
Sodium: 128 mmol/L — ABNORMAL LOW (ref 135–145)
Total Bilirubin: 0.5 mg/dL (ref 0.3–1.2)
Total Protein: 5 g/dL — ABNORMAL LOW (ref 6.5–8.1)

## 2019-06-12 LAB — CALCIUM, IONIZED: Calcium, Ionized, Serum: 5 mg/dL (ref 4.5–5.6)

## 2019-06-12 MED ORDER — POTASSIUM CHLORIDE CRYS ER 20 MEQ PO TBCR
20.0000 meq | EXTENDED_RELEASE_TABLET | Freq: Every day | ORAL | Status: DC
  Administered 2019-06-12: 09:00:00 20 meq via ORAL
  Filled 2019-06-12: qty 1

## 2019-06-12 NOTE — Plan of Care (Signed)

## 2019-06-12 NOTE — Progress Notes (Addendum)
      [address]       [place] [address]             [phone]      7 Days Post-Op Procedure(s) (LRB): ESOPHAGOGASTRODUODENOSCOPY (EGD) (Left) BIOPSY   Subjective:  No new complaints.  States she had an okay day yesterday.  She was able to eat some hamburger and other items.  Tolerating Ensure between meals.  She had a large BM last night.  Objective: Vital signs in last 24 hours: Temp:  [97.7 F (36.5 C)-99.1 F (37.3 C)] 98.4 F (36.9 C) (02/16 0353) Pulse Rate:  [84-86] 84 (02/16 0353) Cardiac Rhythm: Normal sinus rhythm (02/16 0415) Resp:  [15-20] 15 (02/16 0353) BP: (105-117)/(67-78) 110/73 (02/16 0353) SpO2:  [95 %-96 %] 96 % (02/16 0353)  Intake/Output from previous day: 02/15 0701 - 02/16 0700 In: 666.5 [P.O.:360; I.V.:10; IV Piggyback:296.5] Out: 1900 [Urine:1900]  General appearance: alert, cooperative and no distress Heart: regular rate and rhythm Lungs: clear to auscultation bilaterally Abdomen: soft, distention improved, hypoactive BS Extremities: extremities normal, atraumatic, no cyanosis or edema Wound: clean and dry  Lab Results: No results for input(s): WBC, HGB, HCT, PLT in the last 72 hours. BMET:  Recent Labs    06/11/19 0845 06/12/19 0415  NA 132* 128*  K 2.9* 3.7  CL 103 90*  CO2 21* 29  GLUCOSE 85 90  BUN <5* 7*  CREATININE <0.30* 0.36*  CALCIUM 6.3* 8.5*    PT/INR: No results for input(s): LABPROT, INR in the last 72 hours. ABG    Component Value Date/Time   PHART 7.468 (H) 05/29/2019 0423   HCO3 39.2 (H) 05/29/2019 0423   TCO2 30 05/28/2019 1115   ACIDBASEDEF 1.0 05/28/2019 1115   O2SAT 69.3 05/29/2019 0423   CBG (last 3)  No results for input(s): GLUCAP in the last 72 hours.  Assessment/Plan: S/P Procedure(s) (LRB): ESOPHAGOGASTRODUODENOSCOPY (EGD) (Left) BIOPSY  1. CV- NSR, BP- on Norvasc, Toprol XL 2. Pulm- chest tube removed yesterday, follow up CXR remains stable, wean oxygen as  tolerated 3. Hyponatremia persists, on IV Saline 4. Hypokalemia- resolved, K at 3.7, will continue potassium at 20 meq 5. GI- esophagitis, continue Carafate, Magic mouthwash, protonix, oral intake is improving 6. Pain control- good control with Durgesic patch 7. Dispo- patient stable, needs to ambulate today, continue oral intake as able, supplement K daily with oral tablets, remains hyponatremic on IV saline, continue current care   LOS: 15 days    Ellwood Handler, PA-C  06/12/2019 Patient seen and examined, agree with above CXR looks oK She is not on IV saline any more, sodium down slightly - will recheck in AM  Hollyann Pablo C. Roxan Hockey, MD [facility] [phone]

## 2019-06-13 ENCOUNTER — Inpatient Hospital Stay (HOSPITAL_COMMUNITY): Payer: Medicare PPO

## 2019-06-13 LAB — BASIC METABOLIC PANEL
Anion gap: 9 (ref 5–15)
BUN: 7 mg/dL — ABNORMAL LOW (ref 8–23)
CO2: 29 mmol/L (ref 22–32)
Calcium: 8.3 mg/dL — ABNORMAL LOW (ref 8.9–10.3)
Chloride: 88 mmol/L — ABNORMAL LOW (ref 98–111)
Creatinine, Ser: 0.42 mg/dL — ABNORMAL LOW (ref 0.44–1.00)
GFR calc Af Amer: >60 mL/min (ref >60)
GFR calc non Af Amer: >60 mL/min (ref >60)
Glucose, Bld: 93 mg/dL (ref 70–99)
Potassium: 3.6 mmol/L (ref 3.5–5.1)
Sodium: 126 mmol/L — ABNORMAL LOW (ref 135–145)

## 2019-06-13 MED ORDER — POLYETHYLENE GLYCOL 3350 17 G PO PACK: 17.0000 g | PACK | Freq: Every day | ORAL | 0 refills | Status: DC

## 2019-06-13 MED ORDER — FENTANYL 25 MCG/HR TD PT72: 1.0000 | MEDICATED_PATCH | TRANSDERMAL | 0 refills | Status: DC

## 2019-06-13 MED ORDER — SUCRALFATE 1 GM/10ML PO SUSP: 1.0000 g | Freq: Three times a day (TID) | ORAL | 1 refills | Status: DC

## 2019-06-13 MED ORDER — FOLIC ACID 1 MG PO TABS: 1.0000 mg | ORAL_TABLET | Freq: Every day | ORAL | 0 refills | Status: DC

## 2019-06-13 MED ORDER — POTASSIUM CHLORIDE CRYS ER 15 MEQ PO TBCR: 30.0000 meq | EXTENDED_RELEASE_TABLET | Freq: Every day | ORAL | 0 refills | Status: DC

## 2019-06-13 MED ORDER — PANTOPRAZOLE SODIUM 40 MG PO TBEC: 40.0000 mg | DELAYED_RELEASE_TABLET | Freq: Two times a day (BID) | ORAL | 3 refills | Status: AC

## 2019-06-13 MED ORDER — NICOTINE 21 MG/24HR TD PT24: 21.0000 mg | MEDICATED_PATCH | Freq: Every day | TRANSDERMAL | 0 refills | Status: DC

## 2019-06-13 MED ORDER — TRAMADOL HCL 50 MG PO TABS: 50.0000 mg | ORAL_TABLET | Freq: Three times a day (TID) | ORAL | 0 refills | Status: DC | PRN

## 2019-06-13 MED ORDER — MAGIC MOUTHWASH: 5.0000 mL | Freq: Three times a day (TID) | ORAL | 0 refills | Status: DC | PRN

## 2019-06-13 MED ORDER — POTASSIUM CHLORIDE CRYS ER 20 MEQ PO TBCR
30.0000 meq | EXTENDED_RELEASE_TABLET | Freq: Every day | ORAL | Status: DC
  Administered 2019-06-13: 30 meq via ORAL
  Filled 2019-06-13: qty 1

## 2019-06-13 MED FILL — POTASSIUM CHL ER M10 TABLET: 10 | 15 days supply | Qty: 45 | Fill #0

## 2019-06-13 MED FILL — NICOTINE 21 MG/24HR PATCH: 21 | 14 days supply | Qty: 14 | Fill #0

## 2019-06-13 MED FILL — FOLIC ACID 1 MG TABS: 1 | 30 days supply | Qty: 30 | Fill #0

## 2019-06-13 MED FILL — SUCRALFATE 1 GM/10ML SUSP: 1 | 10 days supply | Qty: 420 | Fill #0

## 2019-06-13 MED FILL — PANTOPRAZOLE SOD DR 40 MG T: 40 | 30 days supply | Qty: 60 | Fill #0

## 2019-06-13 MED FILL — traMADol HCL 50 MG TABS: 50 | 7 days supply | Qty: 21 | Fill #0

## 2019-06-13 NOTE — Progress Notes (Signed)
SATURATION QUALIFICATIONS: (This note is used to comply with regulatory documentation for home oxygen)  Patient Saturations on Room Air at Rest = 89%  Patient Saturations on Room Air while Ambulating = 80%  Patient Saturations on 3 Liters of oxygen while Ambulating = 96%  Please briefly explain why patient needs home oxygen: Pt get SOB with exertion and ambulation.

## 2019-06-13 NOTE — Progress Notes (Addendum)
      [address]       [place] [address]             [phone]      8 Days Post-Op Procedure(s) (LRB): ESOPHAGOGASTRODUODENOSCOPY (EGD) (Left) BIOPSY   Subjective:  No new complaints.  Feels like she is doing better.  + BM, passing gas regularly, oral intake stable  Objective: Vital signs in last 24 hours: Temp:  [97.5 F (36.4 C)-98.4 F (36.9 C)] 97.5 F (36.4 C) (02/17 0730) Pulse Rate:  [74-89] 88 (02/17 0730) Cardiac Rhythm: Normal sinus rhythm (02/17 0455) Resp:  [14-19] 17 (02/17 0730) BP: (103-125)/(70-83) 103/70 (02/17 0730) SpO2:  [90 %-100 %] 93 % (02/17 0730)  Intake/Output from previous day: 02/16 0701 - 02/17 0700 In: 10 [I.V.:10] Out: 700 [Urine:700]  General appearance: alert, cooperative and no distress Heart: regular rate and rhythm Lungs: clear to auscultation bilaterally Abdomen: soft, non-tender; mild distention, bowel sounds normal; no masses,  no organomegaly Extremities: extremities normal, atraumatic, no cyanosis or edema Wound: clean and dry  Lab Results: No results for input(s): WBC, HGB, HCT, PLT in the last 72 hours. BMET:  Recent Labs    06/12/19 0415 06/13/19 0455  NA 128* 126*  K 3.7 3.6  CL 90* 88*  CO2 29 29  GLUCOSE 90 93  BUN 7* 7*  CREATININE 0.36* 0.42*  CALCIUM 8.5* 8.3*    PT/INR: No results for input(s): LABPROT, INR in the last 72 hours. ABG    Component Value Date/Time   PHART 7.468 (H) 05/29/2019 0423   HCO3 39.2 (H) 05/29/2019 0423   TCO2 30 05/28/2019 1115   ACIDBASEDEF 1.0 05/28/2019 1115   O2SAT 69.3 05/29/2019 0423   CBG (last 3)  No results for input(s): GLUCAP in the last 72 hours.  Assessment/Plan: S/P Procedure(s) (LRB): ESOPHAGOGASTRODUODENOSCOPY (EGD) (Left) BIOPSY  1. CV- remains hemodynamically stable on Norvasc, Toprol XL 2. Pulm- no acute issues, still requiring oxygen, nursing will obtain O2, walk test, for likely home use 3. Hyponatremia- down to 126 this  morning 4. Renal- potassium level remains stable, continue supplementation, creatinine WNL 5. Esophagitis- improving, patient with regular oral intake, increasing as able 6. Dispo- patient stable, hyponatremia persists, hypokalemia resolved, will make arrangements for H/H, home oxygen use, continue current care   LOS: 16 days    Ellwood Handler, PA-C  06/13/2019   Feels better today Possible dc later today or tomorrow  Remo Lipps C. Roxan Hockey, MD [facility] [phone]

## 2019-06-13 NOTE — TOC Transition Note (Addendum)
Transition of Care (TOC) - CM/SW Discharge Note Marvetta Gibbons RN, BSN Transitions of Care Unit 4E- RN Case Manager ([place] cross coverage) [phone]   Patient Details  Name: DEMARIA DEENEY MRN: [id number] Date of Birth: 05/09/1950  Transition of Care [facility]) CM/SW Contact:  Dawayne Patricia, RN Phone Number: 06/13/2019, 12:55 PM   Clinical Narrative:    Pt s/p lobectomy, orders placed for home 02 DME and HHRN/PT- CM spoke with pt at bedside to discuss transition of care needs- list provided for [facility] choice- Per CMS guidelines from medicare.gov website with star ratings (copy placed in shadow chart)- per pt she does not have a preference for [facility] or DME agencies as long as they can work both her and at there daughter's home in [place] where she will be staying temporarily.  Daughter's name- Burnard Bunting- address- [address] [place] [address], phone # [phone] Pt is agreeable to using [employer] for home 02 needs- call made to [facility] with [employer] for home 02 needs- they can provide portable concentrator for transport needs to [facility] and have home concentrator delivered to daughter's address in [place]- address provided to [employer] for daughter's home. - Call also made to [facility] with [facility]- they service both [place] area and [place] and can start services while pt is at daughter's then transfer [facility] services back to [address] when pt returns home if [facility] services still needed at that time. - CM will update pt of arrangements made- and have placed HH/DME contacts on AVS.  1425- contacted by the patient regarding a rolling walker- pt states she has been using a RW here to ambulate and feels like she will need one for home- order placed and call made to [facility] with [employer] to add RW to DME needs- portable concentrator has been delivered to room already- [employer] will also bring RW to room prior to discharge.  Final next level of care: [place] Barriers to Discharge: No Barriers  Identified   Patient Goals and CMS Choice Patient states their goals for this hospitalization and ongoing recovery are:: to be able to breathe better and be without pain CMS Medicare.gov Compare Post Acute Care list provided to:: Patient Choice offered to / list presented to : Patient  Discharge Placement               Home with [facility]        Discharge Plan and Services   Discharge Planning Services: CM Consult Post Acute Care Choice: Durable Medical Equipment, Home Health          DME Arranged: Oxygen rolling walker DME Agency: [employer] Date DME Agency Contacted: 06/13/19 Time DME Agency Contacted: 1100 Representative spoke with at DME Agency: Thedore Mins HH Arranged: RN, PT [place] Agency: [place] Date [place]: 06/13/19 Time [place]: 1255 Representative spoke with at [place]: [place] ([place]) Interventions     Readmission Risk Interventions Readmission Risk Prevention Plan 06/13/2019  Post Dischage Appt Complete  Medication Screening Complete  Transportation Screening Complete  Some recent data might be hidden

## 2019-06-13 NOTE — Plan of Care (Signed)
Pt adequate for discharge. PICC line d/c per order. 2 sutures removed per order. Discharge instructions given to pt. All questions answered.  Problem: Health Behavior/Discharge Planning: Goal: Ability to manage health-related needs will improve Outcome: Adequate for Discharge   Problem: Clinical Measurements: Goal: Ability to maintain clinical measurements within normal limits will improve Outcome: Adequate for Discharge Goal: Will remain free from infection Outcome: Adequate for Discharge Goal: Diagnostic test results will improve Outcome: Adequate for Discharge Goal: Respiratory complications will improve Outcome: Adequate for Discharge Goal: Cardiovascular complication will be avoided Outcome: Adequate for Discharge   Problem: Activity: Goal: Risk for activity intolerance will decrease Outcome: Adequate for Discharge   Problem: Nutrition: Goal: Adequate nutrition will be maintained Outcome: Adequate for Discharge   Problem: Coping: Goal: Level of anxiety will decrease Outcome: Adequate for Discharge   Problem: Elimination: Goal: Will not experience complications related to bowel motility Outcome: Adequate for Discharge Goal: Will not experience complications related to urinary retention Outcome: Adequate for Discharge   Problem: Pain Managment: Goal: General experience of comfort will improve Outcome: Adequate for Discharge   Problem: Safety: Goal: Ability to remain free from injury will improve Outcome: Adequate for Discharge   Problem: Skin Integrity: Goal: Risk for impaired skin integrity will decrease Outcome: Adequate for Discharge

## 2019-06-14 ENCOUNTER — Telehealth: Payer: Self-pay

## 2019-06-14 NOTE — Telephone Encounter (Signed)
magic mouthwash Soln Take 5 mLs by mouth 3 (three) times daily as needed for mouth pain. Called to [facility] pharm/[phone] Stop Micardis HCT. Continue Norvasc daily, check BP's twice a day. Continue home oxygen 24/7  Have labs re-check in 1-2 weeks with PCP

## 2019-06-15 DIAGNOSIS — Z483 Aftercare following surgery for neoplasm: Secondary | ICD-10-CM

## 2019-06-15 DIAGNOSIS — C3411 Malignant neoplasm of upper lobe, right bronchus or lung: Secondary | ICD-10-CM | POA: Diagnosis not present

## 2019-06-17 NOTE — Progress Notes (Signed)
Virtual Visit via Video Note   This visit type was conducted due to national recommendations for restrictions regarding the COVID-19 Pandemic (e.g. social distancing) in an effort to limit this patient's exposure and mitigate transmission in our community.  Due to her co-morbid illnesses, this patient is at least at moderate risk for complications without adequate follow up.  This format is felt to be most appropriate for this patient at this time.  All issues noted in this document were discussed and addressed.  A limited physical exam was performed with this format.  Please refer to the patient's chart for her consent to telehealth for [facility].   Date:  06/18/2019   ID:  [name], DOB 05-Nov-1950, MRN [id number]  Patient Location: Home Provider Location: Home  PCP:  Jani Gravel, MD  Cardiologist:  Pixie Casino, MD Electrophysiologist:  None   Evaluation Performed:  Follow-Up Visit  Chief Complaint:  Post hospital follow up   History of Present Illness:    [name] is a 69 y.o. female with we are seeing posthospitalization after consultation by Dr. Beatrix Fetters on 06/07/2019 in the setting of wide-complex tachycardia.  She has a history of difficult to control hypertension, on telmisartan/HCTZ, amlodipine and metoprolol, hyperlipidemia on Crestor, and chronic dyspnea.  Chest CT was ordered and lung cancer screening was performed she was first referred to CT TS and underwent PET scanning which was found to be abnormal.  She underwent robotic assisted thoracoscopy along with wedge resection of the right upper lobe on 05/28/2019 for stage well-differentiated adenocarcinoma measuring 1.6 cm inside confined to the lungs.  After recovery from the procedure while walking in the hall with her nurse she began to feel off balance and had to stop and noticed that her heart rate increased to 180 bpm and then returned to normal.  She had no associated symptoms of palpitation  shortness of breath or dizziness.  Telemetry did confirm that she had wide-complex tachycardia possibly VT.  She was noted to have hypokalemia, telemetry the night before revealed complete heart block with a rate of 48 bpm.  Beta-blocker was discontinued and electrophysiology was consulted.  Echocardiogram revealed normal LV systolic function of EF 55 to 60%.  Grade 1 diastolic dysfunction.  No valvular abnormalities.  It was felt that the patient's symptoms were related to hypokalemia with follow-up potassium of 2.6.  She was given potassium supplements with a goal to keep potassium greater than 4.0 and magnesium supplements to keep magnesium greater than 2.0.  Due to history of advanced three-vessel CAD on recent CT scan dated 04/16/2020 it is recommended that the patient have ischemic evaluation.  This was discussed with the patient during hospitalization and she reviewed refused at that time.  She has some significant edema over the weekend which resolved on its own. She has not been taking metoprolol or telmisartan as this was discontinued. She questions whether she should restart this. She continues to have some pain from the surgery site and is to see Dr. Roxan Hockey on June 26, 2019.   She is living with her daughter who is in [place]. HHN from [place], [place]) [phone], they come once a week.   The patient does not have symptoms concerning for COVID-19 infection (fever, chills, cough, or new shortness of breath).    Past Medical History:  Diagnosis Date  . Active smoker   . Anxiety   . Cervical dysplasia   . Complication of anesthesia    "I'm  usually hard to wake up"  . Dyslipidemia   . Endometriosis   . History of nuclear stress test 07/07/2010   dipyridamole; low risk, post-stress EF 65%  . Hypertension   . Migraine   . Osteopenia    08/2006 and 08/2008 Dexa Scans w Isaiah Blakes   . Pneumonia    hx pneunomia ~2016  . PONV (postoperative nausea and vomiting)   . Sleep apnea     does not wear her CPAP because "it gave me respiratory issues (bronchities and pneumonia)"   Past Surgical History:  Procedure Laterality Date  . BACK SURGERY  2002   RUPTURED DISC   . BIOPSY  06/05/2019   Procedure: BIOPSY;  Surgeon: Carol Ada, MD;  Location: [place];  Service: Endoscopy;;  . CARDIAC CATHETERIZATION  11/20/2002   patent coronary arteries   . CHOLECYSTECTOMY  2006  . COLPOSCOPY    . ESOPHAGOGASTRODUODENOSCOPY Left 06/05/2019   Procedure: ESOPHAGOGASTRODUODENOSCOPY (EGD);  Surgeon: Carol Ada, MD;  Location: [facility];  Service: Endoscopy;  Laterality: Left;  . GYNECOLOGIC CRYOSURGERY    . INTERCOSTAL NERVE BLOCK Right 05/28/2019   Procedure: Intercostal Nerve Block;  Surgeon: Melrose Nakayama, MD;  Location: [place] [facility];  Service: Thoracic;  Laterality: Right;  . NODE DISSECTION  05/28/2019   Procedure: Node Dissection;  Surgeon: Melrose Nakayama, MD;  Location: [facility] OR;  Service: Thoracic;;  . NOSE SURGERY     X2  . OOPHORECTOMY     BSO  . SHOULDER SURGERY     ROTATOR CUFF X 2  . THORACOSCOPY  05/28/2019   THORASCOPY-WEDGE RESECTION AND  RIGHT  UPPER LOBECTOMY (Right)  . THROAT SURGERY     REMOVAL OF TUMOR  . TONSILLECTOMY    . VAGINAL HYSTERECTOMY  2000   WITH BSO     Current Meds  Medication Sig  . amLODipine (NORVASC) 5 MG tablet Take 1 tablet (5 mg total) by mouth daily.  Marland Kitchen aspirin EC 81 MG tablet Take 81 mg by mouth daily.  Marland Kitchen denosumab (PROLIA) 60 MG/ML SOLN injection Inject 60 mg into the skin every 6 (six) months. Administer in upper arm, thigh, or abdomen  . fentaNYL (DURAGESIC) 25 MCG/HR Place 1 patch onto the skin every 3 (three) days.  . folic acid (FOLVITE) 1 MG tablet Take 1 tablet (1 mg total) by mouth daily.  . magic mouthwash SOLN Take 5 mLs by mouth 3 (three) times daily as needed for mouth pain.  . montelukast (SINGULAIR) 10 MG tablet Take 10 mg by mouth at bedtime.   . nicotine (NICODERM CQ - DOSED IN MG/24 HOURS) 21 mg/24hr  patch Place 1 patch (21 mg total) onto the skin daily.  . pantoprazole (PROTONIX) 40 MG tablet Take 1 tablet (40 mg total) by mouth 2 (two) times daily.  . polyethylene glycol (MIRALAX / GLYCOLAX) 17 g packet Take 17 g by mouth daily.  . potassium chloride (KLOR-CON M15) 15 MEQ tablet Take 2 tablets (30 mEq total) by mouth daily.  . rosuvastatin (CRESTOR) 10 MG tablet Take 1 tablet (10 mg total) by mouth daily.  . sucralfate (CARAFATE) 1 GM/10ML suspension Take 10 mLs (1 g total) by mouth 4 (four) times daily -  with meals and at bedtime.  . traMADol (ULTRAM) 50 MG tablet Take 1 tablet (50 mg total) by mouth every 8 (eight) hours as needed for moderate pain.  . VENTOLIN HFA 108 (90 BASE) MCG/ACT inhaler Inhale 2 puffs into the lungs every 6 (six) hours  as needed for wheezing or shortness of breath.      Allergies:   Iodinated diagnostic agents, Ioxaglate, Omnipaque [iohexol], Buprenorphine hcl, Codeine, Erythromycin, and Morphine and related   Social History   Tobacco Use  . Smoking status: Current Every Day Smoker    Packs/day: 1.00    Years: 42.00    Pack years: 42.00    Types: Cigarettes  . Smokeless tobacco: Never Used  Substance Use Topics  . Alcohol use: Yes    Alcohol/week: 10.0 standard drinks    Types: 10 Glasses of wine per week    Comment: ~10/week  . Drug use: No     Family Hx: The patient's family history includes Cancer in her mother; Heart attack in her maternal grandfather; Heart disease in her brother and maternal grandfather; Hypertension in her brother, maternal grandfather, and mother.  ROS:   Please see the history of present illness.    All other systems reviewed and are negative.   Prior CV studies:   The following studies were reviewed today: Chest CT 04/18/2019 Cardiovascular: The heart is normal in size. No pericardial effusion.  4.2 cm ascending thoracic aortic aneurysm, unchanged. Atherosclerotic calcifications of the aortic arch.  Three  vessel coronary atherosclerosis.  Mediastinum/Nodes: No suspicious mediastinal lymphadenopathy.  Visualized thyroid is unremarkable.  Lungs/Pleura: Moderate centrilobular and paraseptal emphysematous changes, upper lung predominant.  Biapical pleural-parenchymal scarring, right greater than left.  No focal consolidation. Mild scarring/atelectasis in the medial right lower lobe.  13.1 mm solid/subsolid subpleural nodule in the posterolateral right upper lobe (image 61), new from 2012, suspicious.  No pleural effusion or pneumothorax.  Upper Abdomen: Visualized upper abdomen is notable for prior cholecystectomy and vascular calcifications.  Musculoskeletal: Visualized osseous structures are within normal Limits.  Echocardiogram 06/07/2019 1. Left ventricular ejection fraction, by estimation, is 55 to 60%. The  left ventricle has normal function. The left ventrical has no regional  wall motion abnormalities. Left ventricular diastolic parameters are  consistent with Grade I diastolic  dysfunction (impaired relaxation).  2. Right ventricular systolic function is normal. The right ventricular  size is normal.  3. The mitral valve is normal in structure and function. no evidence of  mitral valve regurgitation. No evidence of mitral stenosis.  4. The aortic valve is normal in structure and function. Aortic valve  regurgitation is not visualized. No aortic stenosis is present.  5. Aortic dilatation noted. There is moderate dilatation of the ascending  aorta measuring 39 mm.   NM Stress Test 2017  The left ventricular ejection fraction is hyperdynamic (>65%).  Nuclear stress EF: 71%.  The study is normal.  This is a low risk study.  There was no ST segment deviation noted during stress.   Normal resting and stress perfusion. No ischemia or infarction EF 71% Baseline ECG with inferior lateral T wave inversions  Labs/Other Tests and Data Reviewed:    EKG:  No  ECG reviewed.  Recent Labs: 06/04/2019: Hemoglobin 10.1; Platelets 263 06/07/2019: TSH 2.902 06/09/2019: Magnesium 1.6 06/12/2019: ALT 32 06/13/2019: BUN 7; Creatinine, Ser 0.42; Potassium 3.6; Sodium 126   Recent Lipid Panel Lab Results  Component Value Date/Time   CHOL 93 06/08/2019 01:16 AM   TRIG 120 06/08/2019 01:16 AM   HDL 25 (L) 06/08/2019 01:16 AM   CHOLHDL 3.7 06/08/2019 01:16 AM   LDLCALC 44 06/08/2019 01:16 AM    Wt Readings from Last 3 Encounters:  06/18/19 106 lb (48.1 kg)  05/28/19 108 lb (49 kg)  05/24/19 108 lb (49 kg)     Objective:    Vital Signs:  BP 118/82   Pulse 68   Ht 5\' 4"  (1.626 m)   Wt 106 lb (48.1 kg)   SpO2 95%   BMI 18.19 kg/m    VITAL SIGNS:  reviewed GEN:  no acute distress EYES:  sclerae anicteric, EOMI - Extraocular Movements Intact RESPIRATORY:  Wearing O2 via [place].  No dyspnea when speaking. No coughing.  CARDIOVASCULAR:  no peripheral edema NEURO:  alert and oriented x 3, no obvious focal deficit PSYCH:  normal affect  ASSESSMENT & PLAN:    1. Hypertension: BP is low normal off metoprolol and telmisartan currently. She will not be restarted on these medications at this time, as she is still recovering, on pain control and is not as active. May need to consider adding back low dose ARB or BB if medically necessary on follow up.   2. PSVT: Noted during recovery from lung resection. She was found to be hypokalemic. Once she was given potassium replacement and magnesium replacement she did not experience any further ectopy.  Due to hypotension, she was taken off of metoprolol. Will see her on follow up in 6 weeks.   3. S/P Right upper lobe wedge resection: Still sore from the incision but otherwise okay. She continues on home 02. She is to follow up with Dr. Gorden Harms on 06/26/2019. Continues on HHN follow up with her daughter in [place]. We will see if we can have blood drawn via HHN. [employer].   4. Hyperlipidemia: Will need ongoing  assessment on follow up appointments. Not urgent at this time.   COVID-19 Education: The signs and symptoms of COVID-19 were discussed with the patient and how to seek care for testing (follow up with PCP or arrange E-visit).  The importance of social distancing was discussed today.  Time:   Today, I have spent 20 minutes with the patient with telehealth technology discussing the above problems.     Medication Adjustments/Labs and Tests Ordered: Current medicines are reviewed at length with the patient today.  Concerns regarding medicines are outlined above.   Tests Ordered: BMET through [facility]   Medication Changes: No orders of the defined types were placed in this encounter.   Disposition:  Follow up 6 weeks in the office with Dr. Debara Pickett or with me.  Signed, Phill Myron. West Pugh, ANP, AACC  06/18/2019 10:12 AM    [facility]

## 2019-06-18 ENCOUNTER — Telehealth (INDEPENDENT_AMBULATORY_CARE_PROVIDER_SITE_OTHER): Payer: Medicare PPO | Admitting: Adult Health

## 2019-06-18 ENCOUNTER — Encounter: Payer: Self-pay | Admitting: Thoracic Surgery (Cardiothoracic Vascular Surgery)

## 2019-06-18 ENCOUNTER — Encounter: Payer: Self-pay | Admitting: Adult Health

## 2019-06-18 VITALS — BP 118/82 | HR 68 | Ht 64.0 in | Wt 106.0 lb

## 2019-06-18 DIAGNOSIS — R911 Solitary pulmonary nodule: Secondary | ICD-10-CM | POA: Diagnosis not present

## 2019-06-18 DIAGNOSIS — E785 Hyperlipidemia, unspecified: Secondary | ICD-10-CM

## 2019-06-18 DIAGNOSIS — I10 Essential (primary) hypertension: Secondary | ICD-10-CM | POA: Diagnosis not present

## 2019-06-18 DIAGNOSIS — Z79899 Other long term (current) drug therapy: Secondary | ICD-10-CM | POA: Diagnosis not present

## 2019-06-18 NOTE — Patient Instructions (Signed)
Medication Instructions:  Continue current medications  *If you need a refill on your cardiac medications before your next appointment, please call your pharmacy*  Lab Work: BMP  If you have labs (blood work) drawn today and your tests are completely normal, you will receive your results only by: Marland Kitchen MyChart Message (if you have MyChart) OR . A paper copy in the mail If you have any lab test that is abnormal or we need to change your treatment, we will call you to review the results.  Testing/Procedures: None Ordered  Follow-Up: At [facility], you and your health needs are our priority.  As part of our continuing mission to provide you with exceptional heart care, we have created designated Provider Care Teams.  These Care Teams include your primary Cardiologist (physician) and Advanced Practice Providers (APPs -  Physician Assistants and Nurse Practitioners) who all work together to provide you with the care you need, when you need it.  Your next appointment:   Monday April 12th @ 1:45 pm  The format for your next appointment:   In Person  Provider:   Jory Sims, DNP, ANP

## 2019-06-19 ENCOUNTER — Other Ambulatory Visit: Payer: Self-pay | Admitting: *Deleted

## 2019-06-19 ENCOUNTER — Telehealth: Payer: Self-pay | Admitting: Internal Medicine

## 2019-06-19 DIAGNOSIS — G8918 Other acute postprocedural pain: Secondary | ICD-10-CM

## 2019-06-19 MED ORDER — TRAMADOL HCL 50 MG PO TABS: 50.0000 mg | ORAL_TABLET | Freq: Three times a day (TID) | ORAL | 0 refills | Status: DC | PRN

## 2019-06-19 NOTE — Progress Notes (Unsigned)
Note.Marland KitchenMarland KitchenMarland KitchenHer Tramadol script that was ordered on 06/19/19 was phoned in to the [facility] in [place], [place].  [address] and [address].

## 2019-06-19 NOTE — Telephone Encounter (Signed)
Lab ordered fax to [facility]

## 2019-06-19 NOTE — Telephone Encounter (Signed)
Left message to call back  

## 2019-06-19 NOTE — Telephone Encounter (Signed)
RN spoke to  Nurse ( Angelica from [facility]) Angelica would like clarification of the following from recent visit note   1)  if patient should remain not taking Metoprolol   .  2) if there was order faxed for  Home health to draw , if so what is it. 3) If the provider knew what level of oxygen the patient is using ? Per  Angelica patient is using 2 liter of oxygen.   RN informed Angelica , per visit note on 06/18/19 - yes patient is to remain off Metoprolol and telmisartan.  Labs need is BMP ,and order was faxed to [facility].  It is noted that patient is using home oxygen but not the amount of oxygen.  She verbalized understanding

## 2019-06-19 NOTE — Telephone Encounter (Signed)
New message   [name] is calling to get clarification on Metoprolol that it has been discontinued. The oxygen level has been changed to 2 liters. Please call to discuss.

## 2019-06-20 ENCOUNTER — Other Ambulatory Visit: Payer: Self-pay | Admitting: *Deleted

## 2019-06-20 ENCOUNTER — Other Ambulatory Visit: Payer: Self-pay | Admitting: Thoracic Surgery (Cardiothoracic Vascular Surgery)

## 2019-06-20 MED ORDER — FENTANYL 25 MCG/HR TD PT72: 1.0000 | MEDICATED_PATCH | TRANSDERMAL | 0 refills | Status: DC

## 2019-06-21 ENCOUNTER — Other Ambulatory Visit (INDEPENDENT_AMBULATORY_CARE_PROVIDER_SITE_OTHER): Payer: Self-pay

## 2019-06-22 ENCOUNTER — Other Ambulatory Visit: Payer: Self-pay

## 2019-06-22 DIAGNOSIS — R911 Solitary pulmonary nodule: Secondary | ICD-10-CM

## 2019-06-22 DIAGNOSIS — F17200 Nicotine dependence, unspecified, uncomplicated: Secondary | ICD-10-CM

## 2019-06-22 MED ORDER — NICOTINE 21 MG/24HR TD PT24: 21.0000 mg | MEDICATED_PATCH | Freq: Every day | TRANSDERMAL | 0 refills | Status: DC

## 2019-06-22 MED ORDER — NICOTINE 21 MG/24HR TD PT24: 21.0000 mg | MEDICATED_PATCH | Freq: Every day | TRANSDERMAL | 0 refills | Status: AC

## 2019-06-22 NOTE — Telephone Encounter (Signed)
One week supply of Nicoderm patches refilled per pt request to assure she has enough to last until upcoming appointment with Dr. Roxan Hockey on 06/26/19.

## 2019-06-22 NOTE — Telephone Encounter (Signed)
Refill for Nicoderm patches sent to pharm in [place]

## 2019-06-25 ENCOUNTER — Other Ambulatory Visit: Payer: Self-pay | Admitting: Thoracic Surgery (Cardiothoracic Vascular Surgery)

## 2019-06-25 DIAGNOSIS — Z902 Acquired absence of lung [part of]: Secondary | ICD-10-CM

## 2019-06-25 NOTE — Telephone Encounter (Signed)
[facility] @ [phone] and notified them that Dr. Debara Pickett is not writing her [place] orders, specifically her O2 orders. Provided phone # for TCTS MD

## 2019-06-26 ENCOUNTER — Other Ambulatory Visit: Payer: Self-pay | Admitting: *Deleted

## 2019-06-26 ENCOUNTER — Other Ambulatory Visit: Payer: Self-pay

## 2019-06-26 ENCOUNTER — Ambulatory Visit
Admission: RE | Admit: 2019-06-26 | Discharge: 2019-06-26 | Disposition: A | Discharge status: Home / Self Care | Payer: Medicare PPO | Source: Ambulatory Visit | Attending: Thoracic Surgery (Cardiothoracic Vascular Surgery) | Admitting: Thoracic Surgery (Cardiothoracic Vascular Surgery)

## 2019-06-26 ENCOUNTER — Encounter: Payer: Self-pay | Admitting: Thoracic Surgery (Cardiothoracic Vascular Surgery)

## 2019-06-26 ENCOUNTER — Ambulatory Visit (INDEPENDENT_AMBULATORY_CARE_PROVIDER_SITE_OTHER): Payer: Self-pay | Admitting: Thoracic Surgery (Cardiothoracic Vascular Surgery)

## 2019-06-26 DIAGNOSIS — C3411 Malignant neoplasm of upper lobe, right bronchus or lung: Secondary | ICD-10-CM

## 2019-06-26 DIAGNOSIS — Z902 Acquired absence of lung [part of]: Secondary | ICD-10-CM

## 2019-06-26 DIAGNOSIS — G8918 Other acute postprocedural pain: Secondary | ICD-10-CM

## 2019-06-26 MED ORDER — GABAPENTIN 300 MG PO CAPS: ORAL_CAPSULE | ORAL | 2 refills | Status: DC

## 2019-06-26 MED ORDER — TRAMADOL HCL 50 MG PO TABS: 50.0000 mg | ORAL_TABLET | Freq: Three times a day (TID) | ORAL | 0 refills | Status: DC | PRN

## 2019-06-26 NOTE — Progress Notes (Signed)
[address]       [place] [address]             [phone]     HPI: [name] returns for a scheduled postoperative follow-up visit  [name] is a 69 year old woman with a history of tobacco abuse, COPD, anxiety, migraines, hypertension, dyslipidemia, and endometriosis.  She had a low-dose screening CT for lung cancer due to her smoking history.  She was found to have a 1.3 cm mixed density nodule in the posterior lateral right upper lobe.  The nodule was hypermetabolic on PET/CT.  We were unable to get pulmonary function testing at the time due to the Covid that her 6-minute walk test indicated that she should be able to tolerate a resection.  She had a robotic right upper lobectomy and node dissection on 05/28/2019.  Her postoperative course was complicated initially by an air leak which resolved.  She had a lot of postoperative pain.  She developed an ileus and also had a lot of difficulty with swallowing.  She was seen in consultation by gastroenterology and had a EGD.  She was found to have severe esophagitis and a gastric ulcer.  She had some electrolyte abnormalities and an episode of wide-complex tachycardia.  She was seen in consultation by cardiology.  She initially went home and stayed with her daughter in [place].  She is now back in [place].  She says that she still has significant pain.  It comes on is a very sharp and sudden pain and then will resolve.  She is using a fentanyl patch and also taking tramadol about 3 times a day.  She has not smoked since her surgery.  She is not having any difficulties with swallowing.  She did have some rather significant peripheral edema while she was over in [place].  That has improved but she does notice that late in the day her ankles tend to swell.  Past Medical History:  Diagnosis Date  . Active smoker   . Anxiety   . Cervical dysplasia   . Complication of anesthesia    "I'm usually hard to wake up"  .  Dyslipidemia   . Endometriosis   . History of nuclear stress test 07/07/2010   dipyridamole; low risk, post-stress EF 65%  . Hypertension   . Migraine   . Osteopenia    08/2006 and 08/2008 Dexa Scans w Isaiah Blakes   . Pneumonia    hx pneunomia ~2016  . PONV (postoperative nausea and vomiting)   . Sleep apnea    does not wear her CPAP because "it gave me respiratory issues (bronchities and pneumonia)"    Current Outpatient Medications  Medication Sig Dispense Refill  . amLODipine (NORVASC) 5 MG tablet Take 1 tablet (5 mg total) by mouth daily. 90 tablet 1  . aspirin EC 81 MG tablet Take 81 mg by mouth daily.    Marland Kitchen denosumab (PROLIA) 60 MG/ML SOLN injection Inject 60 mg into the skin every 6 (six) months. Administer in upper arm, thigh, or abdomen    . fentaNYL (DURAGESIC) 25 MCG/HR Place 1 patch onto the skin every 3 (three) days. 3 patch 0  . folic acid (FOLVITE) 1 MG tablet Take 1 tablet (1 mg total) by mouth daily. 30 tablet 0  . magic mouthwash SOLN Take 5 mLs by mouth 3 (three) times daily as needed for mouth pain. 250 mL 0  . montelukast (SINGULAIR) 10 MG tablet Take 10 mg  by mouth at bedtime.   0  . nicotine (NICODERM CQ - DOSED IN MG/24 HOURS) 21 mg/24hr patch Place 1 patch (21 mg total) onto the skin daily for 7 days. 7 patch 0  . pantoprazole (PROTONIX) 40 MG tablet Take 1 tablet (40 mg total) by mouth 2 (two) times daily. 60 tablet 3  . rosuvastatin (CRESTOR) 10 MG tablet Take 1 tablet (10 mg total) by mouth daily. 90 tablet 1  . sucralfate (CARAFATE) 1 GM/10ML suspension Take 10 mLs (1 g total) by mouth 4 (four) times daily -  with meals and at bedtime. 420 mL 1  . traMADol (ULTRAM) 50 MG tablet Take 1 tablet (50 mg total) by mouth every 8 (eight) hours as needed. for moderate pain 21 tablet 0  . VENTOLIN HFA 108 (90 BASE) MCG/ACT inhaler Inhale 2 puffs into the lungs every 6 (six) hours as needed for wheezing or shortness of breath.     . gabapentin (NEURONTIN) 300 MG capsule  Take 1 capsule (300 mg total) by mouth daily for 1 day, THEN 1 capsule (300 mg total) 2 (two) times daily for 1 day, THEN 1 capsule (300 mg total) 3 (three) times daily. 90 capsule 2  . metoprolol succinate (TOPROL-XL) 25 MG 24 hr tablet Take 1 tablet (25 mg total) by mouth daily. Take with or immediately following a meal. (Patient not taking: Reported on 06/18/2019) 90 tablet 3   No current facility-administered medications for this visit.    Physical Exam BP 109/78 (BP Location: Right Arm, Patient Position: Sitting, Cuff Size: Normal)   Pulse 78   Temp 98.1 F (36.7 C) (Temporal)   Resp 20   Ht 5\' 4"  (1.626 m)   Wt 105 lb (47.6 kg)   SpO2 93% Comment: 2 LPM O2 via nasal cannula  BMI 18.58 kg/m  69 year old woman in no acute distress Alert and oriented x3 with no focal deficits Tender to touch around incisions Incisions well-healed Lungs diminished at right base but otherwise clear Cardiac regular rate and rhythm  Diagnostic Tests: CHEST - 2 VIEW  COMPARISON:  Chest x-ray 06/13/2019.  FINDINGS: Postoperative changes of right upper lobectomy are noted. Compensatory hyperexpansion of the right lower lobe and right middle lobe. Small right apical pneumothorax. Probable trace bilateral pleural effusions. Left lung is clear. No evidence of pulmonary edema. Heart size is normal. Upper mediastinal contours are within normal limits. Aortic atherosclerosis.  IMPRESSION: 1. Postoperative changes of recent right upper lobectomy with small right hydropneumothorax and trace left pleural effusion. 2. Aortic atherosclerosis.   Electronically Signed   By: Vinnie Langton [name]   On: 06/26/2019 09:55 I personally reviewed the chest x-ray and concur with the findings noted above  Impression: [name] is a 69 year old woman with a history of tobacco abuse, COPD, anxiety, hypertension, migraines, dyslipidemia, and endometriosis.  She recently was found to have a right upper  lobe lung nodule.  That was hypermetabolic on PET/CT.  She had a thoracoscopic wedge resection followed by right upper lobectomy on 05/28/2019.  The nodule turned out to be a stage Ia adenocarcinoma.  She continues to have postoperative pain.  She is on a fentanyl patch at 25 mcg and is using tramadol as needed.  She is pretty much using the tramadol 3 times a day as prescribed.  At this point I think she might respond better to something along the lines of gabapentin.  I wrote a prescription for that.  She understands that that is a  medication that you take on a regular basis and then hopefully be will be able to wean her off the fentanyl and then gradually off the tramadol.  I did refill her prescription for tramadol as she is running low on that.  She has 2 more fentanyl patches which should last her through the week.  If she needs another refill beyond that she will call.  Advised her not to drive until her pain issues have resolved.  She also should avoid heavy lifting just to avoid the discomfort.  Tobacco abuse-she has not smoked since her surgery.  I congratulated her for that and emphasized the importance of continued tobacco cessation.  We will refer her to our multidisciplinary thoracic oncology clinic to see Dr. Julien Nordmann.  She should not need any adjuvant therapy at this time.  I think she would benefit from pulmonary rehabilitation if that program is up and running.  We will go ahead make a referral.  Esophagitis/gastric ulcer-symptoms have resolved.  She will follow up with gastroenterology in in April.  Wide-complex tachycardia-she will follow up with Dr. Debara Pickett.  Plan: Referral to Dr. Julien Nordmann. Referral to pulmonary rehabilitation Return in 4 weeks with PA and lateral chest x-ray  Melrose Nakayama, MD [facility] [phone]

## 2019-06-26 NOTE — Progress Notes (Signed)
pulm

## 2019-06-27 ENCOUNTER — Other Ambulatory Visit: Payer: Self-pay | Admitting: *Deleted

## 2019-06-28 ENCOUNTER — Telehealth: Payer: Self-pay | Admitting: Internal Medicine

## 2019-06-28 ENCOUNTER — Other Ambulatory Visit: Payer: Self-pay | Admitting: Thoracic Surgery (Cardiothoracic Vascular Surgery)

## 2019-06-28 ENCOUNTER — Encounter: Payer: Self-pay | Admitting: Internal Medicine

## 2019-06-28 MED ORDER — FENTANYL 25 MCG/HR TD PT72: 1.0000 | MEDICATED_PATCH | TRANSDERMAL | 0 refills | Status: DC

## 2019-06-28 NOTE — Telephone Encounter (Signed)
Left message for jim, referred him to dr hendrickson office, as they did the surgery.

## 2019-06-28 NOTE — Telephone Encounter (Signed)
New message  Per [name] states the plan for physical therapy: week 1 one, two week 2 and one week 3.   Physical Therapy will be for functional rehab and pulmonary rehab  Patient is unable to safely shower requesting orders for occupational therapy for [name] starting next week two times a week for 2 weeks to give showers   B/P: seated 100/80  Standing 100/80 after walking 4 minutes: patient had headache and b/p increased to 150/90   Patient reports since surgery has been sleepy all day and falls asleep in 5 to 10 minutes unless stimulated, patient sleeps soundly during night and day  Please fax medication updates to [phone].  [name] States that he will need a call back.

## 2019-06-28 NOTE — Progress Notes (Signed)
Refilled fentanyl patch prescription  Remo Lipps C. Roxan Hockey, MD [facility] [phone]

## 2019-06-28 NOTE — Telephone Encounter (Signed)
Received a new pt referral from [place] for rt lung nodule. I attempted to call [name] to schedule an appt but her voicemail is full. A new pt appt has been scheduled for her to see Dr. Julien Nordmann on 3/22 at 2:15pm w/labs at 1:45pm. I sent the referring office a msg to notify the pt as well. Letter mailed.

## 2019-06-29 ENCOUNTER — Other Ambulatory Visit: Payer: Self-pay | Admitting: Thoracic Surgery (Cardiothoracic Vascular Surgery)

## 2019-06-29 MED ORDER — FENTANYL 25 MCG/HR TD PT72: 1.0000 | MEDICATED_PATCH | TRANSDERMAL | 0 refills | Status: DC

## 2019-06-29 NOTE — Progress Notes (Signed)
Fentanyl patch prescription rewritten for 5 patches instead of 3  Jermell Holeman C. Roxan Hockey, MD [facility] [phone]

## 2019-07-02 ENCOUNTER — Telehealth: Payer: Self-pay | Admitting: Internal Medicine

## 2019-07-02 NOTE — Telephone Encounter (Signed)
[name] with [facility] states she is calling to inquire about whether or not the patient is to continue taking   metoprolol succinate (TOPROL-XL) 25 MG 24 hr tablet    medication. She states an order is needed to remove medication completely from the patient's chart.   Please return call to discuss at [phone].

## 2019-07-02 NOTE — Telephone Encounter (Signed)
Attempted to reach [place]. The voicemail box was full.

## 2019-07-03 ENCOUNTER — Other Ambulatory Visit: Payer: Self-pay | Admitting: *Deleted

## 2019-07-03 ENCOUNTER — Other Ambulatory Visit (HOSPITAL_COMMUNITY): Payer: Self-pay | Admitting: *Deleted

## 2019-07-03 DIAGNOSIS — Z902 Acquired absence of lung [part of]: Secondary | ICD-10-CM

## 2019-07-03 DIAGNOSIS — C801 Malignant (primary) neoplasm, unspecified: Secondary | ICD-10-CM

## 2019-07-03 DIAGNOSIS — C3411 Malignant neoplasm of upper lobe, right bronchus or lung: Secondary | ICD-10-CM

## 2019-07-04 ENCOUNTER — Encounter (HOSPITAL_COMMUNITY): Payer: Self-pay | Admitting: *Deleted

## 2019-07-04 NOTE — Progress Notes (Signed)
Received referral from [facility] for this pt to participate in pulmonary rehab with the the diagnosis of 05/28/19 s/p lobectomy of lung due to malignant neoplasm of right upper lobe. Clinical review of pt follow up appt on 3/2 surgical office note. Per notes reviewed pt continues to have postop pain and required additional fentanyl patch along with tramadol three times a day.  Pt continues to work with Home health PT, OT and home health aide and nurse.  Pt will need to complete her home therapies, management of post op pain with non narcotic agents and the ability to drive herself to rehab.  Presently while on fentanyl patch she has not been cleared to drive. Pt with Covid Risk Score - 3. Pt appropriate for scheduling for Pulmonary rehab.  Will forward to support staff for verification of insurance eligibility/benefits.  Will hold on scheduling until she has been seen in follow up on 4/6. Cherre Huger, BSN Cardiac and Training and development officer

## 2019-07-05 NOTE — Telephone Encounter (Signed)
See my chart message med has been removed from pt's list ./cy

## 2019-07-09 ENCOUNTER — Telehealth (HOSPITAL_COMMUNITY): Payer: Self-pay

## 2019-07-09 NOTE — Telephone Encounter (Signed)
Pt insurance is active and benefits verified through [facility]. Co-pay $20.00, DED $0.00/$0.00 met, out of pocket $4,000.00/$$271.92 met, co-insurance 0%. No pre-authorization required. [place]/[employer], 07/09/19 @ 11:35AM  Placed pt ppw in Pulmonary rehab folder for follow up on 4/6.

## 2019-07-13 ENCOUNTER — Telehealth: Payer: Self-pay | Admitting: Medical Oncology

## 2019-07-13 DIAGNOSIS — D751 Secondary polycythemia: Secondary | ICD-10-CM

## 2019-07-13 NOTE — Telephone Encounter (Signed)
Orders placed.

## 2019-07-16 ENCOUNTER — Other Ambulatory Visit: Payer: Self-pay

## 2019-07-16 ENCOUNTER — Encounter: Payer: Self-pay | Admitting: Internal Medicine

## 2019-07-16 ENCOUNTER — Inpatient Hospital Stay: Payer: Medicare PPO | Attending: Internal Medicine

## 2019-07-16 ENCOUNTER — Inpatient Hospital Stay (HOSPITAL_BASED_OUTPATIENT_CLINIC_OR_DEPARTMENT_OTHER): Payer: Medicare PPO | Admitting: Internal Medicine

## 2019-07-16 DIAGNOSIS — I10 Essential (primary) hypertension: Secondary | ICD-10-CM | POA: Insufficient documentation

## 2019-07-16 DIAGNOSIS — F17210 Nicotine dependence, cigarettes, uncomplicated: Secondary | ICD-10-CM | POA: Insufficient documentation

## 2019-07-16 DIAGNOSIS — C3411 Malignant neoplasm of upper lobe, right bronchus or lung: Secondary | ICD-10-CM | POA: Insufficient documentation

## 2019-07-16 DIAGNOSIS — Z79899 Other long term (current) drug therapy: Secondary | ICD-10-CM | POA: Insufficient documentation

## 2019-07-16 DIAGNOSIS — E785 Hyperlipidemia, unspecified: Secondary | ICD-10-CM | POA: Insufficient documentation

## 2019-07-16 DIAGNOSIS — D751 Secondary polycythemia: Secondary | ICD-10-CM

## 2019-07-16 DIAGNOSIS — C3490 Malignant neoplasm of unspecified part of unspecified bronchus or lung: Secondary | ICD-10-CM

## 2019-07-16 DIAGNOSIS — Z902 Acquired absence of lung [part of]: Secondary | ICD-10-CM | POA: Diagnosis not present

## 2019-07-16 LAB — CMP (CANCER CENTER ONLY)
ALT: 7 U/L (ref 0–44)
AST: 14 U/L — ABNORMAL LOW (ref 15–41)
Albumin: 3 g/dL — ABNORMAL LOW (ref 3.5–5.0)
Alkaline Phosphatase: 57 U/L (ref 38–126)
Anion gap: 11 (ref 5–15)
BUN: 10 mg/dL (ref 8–23)
CO2: 30 mmol/L (ref 22–32)
Calcium: 9.7 mg/dL (ref 8.9–10.3)
Chloride: 102 mmol/L (ref 98–111)
Creatinine: 0.72 mg/dL (ref 0.44–1.00)
GFR, Est AFR Am: >60 mL/min (ref >60)
GFR, Estimated: >60 mL/min (ref >60)
Glucose, Bld: 102 mg/dL — ABNORMAL HIGH (ref 70–99)
Potassium: 3.8 mmol/L (ref 3.5–5.1)
Sodium: 143 mmol/L (ref 135–145)
Total Bilirubin: 0.3 mg/dL (ref 0.3–1.2)
Total Protein: 6.2 g/dL — ABNORMAL LOW (ref 6.5–8.1)

## 2019-07-16 LAB — CBC WITH DIFFERENTIAL (CANCER CENTER ONLY)
Abs Immature Granulocytes: 0.02 10*3/uL (ref 0.00–0.07)
Basophils Absolute: 0 10*3/uL (ref 0.0–0.1)
Basophils Relative: 0 %
Eosinophils Absolute: 0.2 10*3/uL (ref 0.0–0.5)
Eosinophils Relative: 3 %
HCT: 35.9 % — ABNORMAL LOW (ref 36.0–46.0)
Hemoglobin: 11.4 g/dL — ABNORMAL LOW (ref 12.0–15.0)
Immature Granulocytes: 0 %
Lymphocytes Relative: 22 %
Lymphs Abs: 1.8 10*3/uL (ref 0.7–4.0)
MCH: 30.2 pg (ref 26.0–34.0)
MCHC: 31.8 g/dL (ref 30.0–36.0)
MCV: 95 fL (ref 80.0–100.0)
Monocytes Absolute: 0.6 10*3/uL (ref 0.1–1.0)
Monocytes Relative: 7 %
Neutro Abs: 5.5 10*3/uL (ref 1.7–7.7)
Neutrophils Relative %: 68 %
Platelet Count: 246 10*3/uL (ref 150–400)
RBC: 3.78 MIL/uL — ABNORMAL LOW (ref 3.87–5.11)
RDW: 14.6 % (ref 11.5–15.5)
WBC Count: 8.2 10*3/uL (ref 4.0–10.5)
nRBC: 0 % (ref 0.0–0.2)

## 2019-07-16 NOTE — Progress Notes (Signed)
[place] Telephone:([phone]) [phone]   Fax:(336) [phone]  CONSULT NOTE  REFERRING PHYSICIAN: Dr. Modesto Charon  REASON FOR CONSULTATION:  69 years old white female recently diagnosed with lung cancer.  HPI [name] is a 69 y.o. female with past medical history significant for hypertension, dyslipidemia, endometriosis, and anxiety, sleep apnea, osteoporosis as well as thoracic aortic aneurysm and long history of smoking.  The patient was seen by her cardiologist for routine evaluation and was complaining of persistent cough.  Because of her history for smoking he order CT screening of the chest on April 17, 2019 and it showed posterior right upper lobe lung nodule measuring 1.31 cm in size.  This was followed by a PET scan on May 08, 2019 and it showed mild hypermetabolic activity in the 1.3 cm posterior right upper lobe nodule with no evidence of mediastinal or metastatic hypermetabolic activity.  The patient was referred to Dr. Roxan Hockey and on May 28, 2019 she underwent robotic VATS with right upper lobectomy and lymph node dissection. The final pathology ([id number]) was consistent with invasive well-differentiated adenocarcinoma spanning 1.6 cm.  There was no evidence for visceral pleural involvement or lymphovascular invasion.  The dissected lymph nodes were negative for malignancy. The patient had a prolonged postoperative course with gastric and peptic ulcer disease. She is feeling much better but she is currently on home oxygen.  She denied having any current chest pain except for the soreness from the surgical scar as well as numbness to the front of the chest.  She denied having any shortness of breath except with exertion and no cough or hemoptysis.  She has no recent weight loss or night sweats.  She has no nausea, vomiting, diarrhea or constipation.  She denied having any headache or visual changes. Family history significant for mother with  lung cancer and hypertension.  Brother had heart disease and father had unknown medical history for her. The patient is single and has 2 children a son and daughter.  She was accompanied by her son [name] today.  The patient has a history of smoking 1 pack/day for over 50 years and quit the day before her surgery.  She also has a history of drinking alcohol at regular basis and has not have any drinks since the surgery.  She denied having any history of drug abuse.  HPI  Past Medical History:  Diagnosis Date  . Active smoker   . Anxiety   . Cervical dysplasia   . Complication of anesthesia    "I'm usually hard to wake up"  . Dyslipidemia   . Endometriosis   . History of nuclear stress test 07/07/2010   dipyridamole; low risk, post-stress EF 65%  . Hypertension   . Migraine   . Osteopenia    08/2006 and 08/2008 Dexa Scans w Isaiah Blakes   . Pneumonia    hx pneunomia ~2016  . PONV (postoperative nausea and vomiting)   . Sleep apnea    does not wear her CPAP because "it gave me respiratory issues (bronchities and pneumonia)"    Past Surgical History:  Procedure Laterality Date  . BACK SURGERY  2002   RUPTURED DISC   . BIOPSY  06/05/2019   Procedure: BIOPSY;  Surgeon: Carol Ada, MD;  Location: [facility];  Service: Endoscopy;;  . CARDIAC CATHETERIZATION  11/20/2002   patent coronary arteries   . CHOLECYSTECTOMY  2006  . COLPOSCOPY    . ESOPHAGOGASTRODUODENOSCOPY Left 06/05/2019   Procedure: ESOPHAGOGASTRODUODENOSCOPY (  EGD);  Surgeon: Carol Ada, MD;  Location: [facility];  Service: Endoscopy;  Laterality: Left;  . GYNECOLOGIC CRYOSURGERY    . INTERCOSTAL NERVE BLOCK Right 05/28/2019   Procedure: Intercostal Nerve Block;  Surgeon: Melrose Nakayama, MD;  Location: [facility];  Service: Thoracic;  Laterality: Right;  . NODE DISSECTION  05/28/2019   Procedure: Node Dissection;  Surgeon: Melrose Nakayama, MD;  Location: [facility] OR;  Service: Thoracic;;  . NOSE SURGERY     X2  .  OOPHORECTOMY     BSO  . SHOULDER SURGERY     ROTATOR CUFF X 2  . THORACOSCOPY  05/28/2019   THORASCOPY-WEDGE RESECTION AND  RIGHT  UPPER LOBECTOMY (Right)  . THROAT SURGERY     REMOVAL OF TUMOR  . TONSILLECTOMY    . VAGINAL HYSTERECTOMY  2000   WITH BSO    Family History  Problem Relation Age of Onset  . Hypertension Mother   . Cancer Mother        LIVER AND LUNG  . Hypertension Brother   . Heart disease Brother   . Hypertension Maternal Grandfather   . Heart disease Maternal Grandfather   . Heart attack Maternal Grandfather     Social History Social History   Tobacco Use  . Smoking status: Current Every Day Smoker    Packs/day: 1.00    Years: 42.00    Pack years: 42.00    Types: Cigarettes  . Smokeless tobacco: Never Used  Substance Use Topics  . Alcohol use: Yes    Alcohol/week: 10.0 standard drinks    Types: 10 Glasses of wine per week    Comment: ~10/week  . Drug use: No    Allergies  Allergen Reactions  . Iodinated Diagnostic Agents Itching    itching but no hives just after iv contrast injection w/ 13 hr prep, future contrast not advised unless absolutely necessary, 13 hr prep would still be advised.//a.[place]  . Ioxaglate Itching    itching but no hives just after iv contrast injection w/ 13 hr prep, future contrast not advised unless absolutely necessary, 13 hr prep would still be advised.//a.calhoun  . Omnipaque [Iohexol] Anaphylaxis  . Buprenorphine Hcl Nausea And Vomiting  . Codeine Nausea And Vomiting  . Erythromycin   . Morphine And Related Nausea And Vomiting    Current Outpatient Medications  Medication Sig Dispense Refill  . amLODipine (NORVASC) 5 MG tablet Take 1 tablet (5 mg total) by mouth daily. 90 tablet 1  . aspirin EC 81 MG tablet Take 81 mg by mouth daily.    Marland Kitchen denosumab (PROLIA) 60 MG/ML SOLN injection Inject 60 mg into the skin every 6 (six) months. Administer in upper arm, thigh, or abdomen    . fentaNYL (DURAGESIC) 25 MCG/HR  Place 1 patch onto the skin every 3 (three) days. 5 patch 0  . folic acid (FOLVITE) 1 MG tablet Take 1 tablet (1 mg total) by mouth daily. 30 tablet 0  . gabapentin (NEURONTIN) 300 MG capsule Take 1 capsule (300 mg total) by mouth daily for 1 day, THEN 1 capsule (300 mg total) 2 (two) times daily for 1 day, THEN 1 capsule (300 mg total) 3 (three) times daily. 90 capsule 2  . magic mouthwash SOLN Take 5 mLs by mouth 3 (three) times daily as needed for mouth pain. 250 mL 0  . montelukast (SINGULAIR) 10 MG tablet Take 10 mg by mouth at bedtime.   0  . pantoprazole (PROTONIX) 40 MG tablet  Take 1 tablet (40 mg total) by mouth 2 (two) times daily. 60 tablet 3  . rosuvastatin (CRESTOR) 10 MG tablet Take 1 tablet (10 mg total) by mouth daily. 90 tablet 1  . sucralfate (CARAFATE) 1 GM/10ML suspension Take 10 mLs (1 g total) by mouth 4 (four) times daily -  with meals and at bedtime. 420 mL 1  . traMADol (ULTRAM) 50 MG tablet Take 1 tablet (50 mg total) by mouth every 8 (eight) hours as needed. for moderate pain 21 tablet 0  . VENTOLIN HFA 108 (90 BASE) MCG/ACT inhaler Inhale 2 puffs into the lungs every 6 (six) hours as needed for wheezing or shortness of breath.      No current facility-administered medications for this visit.    Review of Systems  Constitutional: positive for fatigue Eyes: negative Ears, nose, mouth, throat, and face: negative Respiratory: positive for dyspnea on exertion Cardiovascular: negative Gastrointestinal: negative Genitourinary:negative Integument/breast: negative Hematologic/lymphatic: negative Musculoskeletal:negative Neurological: negative Behavioral/Psych: negative Endocrine: negative Allergic/Immunologic: negative  Physical Exam  WJX:BJYNW, healthy, no distress, well nourished and well developed SKIN: skin color, texture, turgor are normal, no rashes or significant lesions HEAD: Normocephalic, No masses, lesions, tenderness or abnormalities EYES: normal,  PERRLA, Conjunctiva are pink and non-injected EARS: External ears normal, Canals clear OROPHARYNX:no exudate, no erythema and lips, buccal mucosa, and tongue normal  NECK: supple, no adenopathy, no JVD LYMPH:  no palpable lymphadenopathy, no hepatosplenomegaly BREAST:not examined LUNGS: clear to auscultation , and palpation HEART: regular rate & rhythm, no murmurs and no gallops ABDOMEN:abdomen soft, non-tender, normal bowel sounds and no masses or organomegaly BACK: No CVA tenderness, Range of motion is normal EXTREMITIES:no joint deformities, effusion, or inflammation, no edema  NEURO: alert & oriented x 3 with fluent speech, no focal motor/sensory deficits  PERFORMANCE STATUS: ECOG 1  LABORATORY DATA: Lab Results  Component Value Date   WBC 8.2 07/16/2019   HGB 11.4 (L) 07/16/2019   HCT 35.9 (L) 07/16/2019   MCV 95.0 07/16/2019   PLT 246 07/16/2019      Chemistry      Component Value Date/Time   NA 126 (L) 06/13/2019 0455   K 3.6 06/13/2019 0455   CL 88 (L) 06/13/2019 0455   CO2 29 06/13/2019 0455   BUN 7 (L) 06/13/2019 0455   CREATININE 0.42 (L) 06/13/2019 0455      Component Value Date/Time   CALCIUM 8.3 (L) 06/13/2019 0455   ALKPHOS 110 06/12/2019 0415   AST 14 (L) 06/12/2019 0415   ALT 32 06/12/2019 0415   BILITOT 0.5 06/12/2019 0415       RADIOGRAPHIC STUDIES: DG Chest 2 View  Result Date: 06/26/2019 CLINICAL DATA:  69 year old female status post right-sided lobectomy 05/28/2019. Persistent soreness from thoracoscopy. Follow-up study. EXAM: CHEST - 2 VIEW COMPARISON:  Chest x-ray 06/13/2019. FINDINGS: Postoperative changes of right upper lobectomy are noted. Compensatory hyperexpansion of the right lower lobe and right middle lobe. Small right apical pneumothorax. Probable trace bilateral pleural effusions. Left lung is clear. No evidence of pulmonary edema. Heart size is normal. Upper mediastinal contours are within normal limits. Aortic atherosclerosis.  IMPRESSION: 1. Postoperative changes of recent right upper lobectomy with small right hydropneumothorax and trace left pleural effusion. 2. Aortic atherosclerosis. Electronically Signed   By: Vinnie Langton [name]   On: 06/26/2019 09:55    ASSESSMENT: This is a very pleasant 69 years old white female recently diagnosed with a stage Ia (T1b, N0, M0) non-small cell lung cancer, adenocarcinoma presented  with right upper lobe nodule status post left upper lobectomy with lymph node dissection under the care of Dr. Roxan Hockey on May 28, 2019.   PLAN: I had a lengthy discussion with the patient and her son today about her current disease stage, prognosis and treatment options.  I explained to the patient that the 5-year survival for patient with a stage Ia is around 50%.  I also explained to them that there is no 5-year survival benefit for adjuvant systemic chemotherapy or radiation for patient with early stage disease like stage Ia non-small cell lung cancer. I recommended for the patient to continue on observation with repeat CT scan of the chest every 6 months for the first 2 years followed by annual exam for a total of 5 years. For the hypertension and dyslipidemia the patient will continue with her current home medications. The patient was also advised to continue quitting smoking. She was advised to call immediately if she has any other concerning symptoms in the interval. The patient voices understanding of current disease status and treatment options and is in agreement with the current care plan.  All questions were answered. The patient knows to call the clinic with any problems, questions or concerns. We can certainly see the patient much sooner if necessary.  Thank you so much for allowing me to participate in the care of [name]. I will continue to follow up the patient with you and assist in her care.  The total time spent in the appointment was 60 minutes.  Disclaimer: This  note was dictated with voice recognition software. Similar sounding words can inadvertently be transcribed and may not be corrected upon review.   Eilleen Kempf July 16, 2019, 2:26 PM

## 2019-07-30 ENCOUNTER — Other Ambulatory Visit: Payer: Self-pay | Admitting: Thoracic Surgery (Cardiothoracic Vascular Surgery)

## 2019-07-30 DIAGNOSIS — Z902 Acquired absence of lung [part of]: Secondary | ICD-10-CM

## 2019-07-31 ENCOUNTER — Ambulatory Visit (INDEPENDENT_AMBULATORY_CARE_PROVIDER_SITE_OTHER): Payer: Self-pay | Admitting: Thoracic Surgery (Cardiothoracic Vascular Surgery)

## 2019-07-31 ENCOUNTER — Other Ambulatory Visit: Payer: Self-pay

## 2019-07-31 ENCOUNTER — Encounter: Payer: Self-pay | Admitting: Thoracic Surgery (Cardiothoracic Vascular Surgery)

## 2019-07-31 ENCOUNTER — Ambulatory Visit
Admission: RE | Admit: 2019-07-31 | Discharge: 2019-07-31 | Disposition: A | Discharge status: Home / Self Care | Payer: Medicare PPO | Source: Ambulatory Visit | Attending: Thoracic Surgery (Cardiothoracic Vascular Surgery) | Admitting: Thoracic Surgery (Cardiothoracic Vascular Surgery)

## 2019-07-31 VITALS — BP 124/88 | HR 77 | Temp 97.7°F | Resp 20 | Ht 64.0 in | Wt 105.0 lb

## 2019-07-31 DIAGNOSIS — Z902 Acquired absence of lung [part of]: Secondary | ICD-10-CM

## 2019-07-31 DIAGNOSIS — C3491 Malignant neoplasm of unspecified part of right bronchus or lung: Secondary | ICD-10-CM

## 2019-07-31 MED ORDER — GABAPENTIN 300 MG PO CAPS: 600.0000 mg | ORAL_CAPSULE | Freq: Three times a day (TID) | ORAL | 2 refills | Status: DC

## 2019-07-31 NOTE — Progress Notes (Signed)
[address]       [place] [address]             [phone]     HPI: Mrs. Bundrick returns for scheduled follow-up visit  [name] is a 69 year old woman with a history of tobacco abuse, COPD, anxiety, migraines, hypertension, dyslipidemia, endometriosis, and a T1N0, stage Ia adenocarcinoma of the right upper lobe.  She had a robotic right upper lobectomy on 05/28/2019.  Her postoperative course was complicated by an air leak although that resolved and the tube was removed prior to discharge.  She also had an ileus and difficulty swallowing.  She was seen by gastroenterology.  EGD showed severe esophagitis and a gastric ulcer.  She had some wide-complex tachycardia and was seen in consultation by cardiology.  That resolved with correcting electrolytes.  I last saw in the office on 06/26/2019.  She was still using a fentanyl patch at that time and also tramadol about 3 times a day.  In the interim since her last visit she has been able to come off the fentanyl patch and is no longer using tramadol.  She is on gabapentin 300 mg 3 times daily and also is using Tylenol.  She does not get much relief from the Tylenol.  She says that her pain comes on suddenly and can be quite intense and lasts about 5 minutes.  She wants to stop using home oxygen.  She is not using it at all during the day.  She has been continuing to use it at night.  Past Medical History:  Diagnosis Date  . Active smoker   . Anxiety   . Cervical dysplasia   . Complication of anesthesia    "I'm usually hard to wake up"  . Dyslipidemia   . Endometriosis   . History of nuclear stress test 07/07/2010   dipyridamole; low risk, post-stress EF 65%  . Hypertension   . Migraine   . Osteopenia    08/2006 and 08/2008 Dexa Scans w Isaiah Blakes   . Pneumonia    hx pneunomia ~2016  . PONV (postoperative nausea and vomiting)   . Sleep apnea    does not wear her CPAP because "it gave me respiratory issues  (bronchities and pneumonia)"    Current Outpatient Medications  Medication Sig Dispense Refill  . amLODipine (NORVASC) 5 MG tablet Take 1 tablet (5 mg total) by mouth daily. 90 tablet 1  . aspirin EC 81 MG tablet Take 81 mg by mouth daily.    Marland Kitchen denosumab (PROLIA) 60 MG/ML SOLN injection Inject 60 mg into the skin every 6 (six) months. Administer in upper arm, thigh, or abdomen    . fentaNYL (DURAGESIC) 25 MCG/HR Place 1 patch onto the skin every 3 (three) days. 5 patch 0  . gabapentin (NEURONTIN) 300 MG capsule Take 2 capsules (600 mg total) by mouth 3 (three) times daily. 180 capsule 2  . montelukast (SINGULAIR) 10 MG tablet Take 10 mg by mouth at bedtime.   0  . OVER THE COUNTER MEDICATION "Our Choice probiotic"    . pantoprazole (PROTONIX) 40 MG tablet Take 1 tablet (40 mg total) by mouth 2 (two) times daily. 60 tablet 3  . rosuvastatin (CRESTOR) 10 MG tablet Take 1 tablet (10 mg total) by mouth daily. 90 tablet 1  . traMADol (ULTRAM) 50 MG tablet Take 1 tablet (50 mg total) by mouth every 8 (eight) hours as needed. for moderate pain 21 tablet 0  .  VENTOLIN HFA 108 (90 BASE) MCG/ACT inhaler Inhale 2 puffs into the lungs every 6 (six) hours as needed for wheezing or shortness of breath.      No current facility-administered medications for this visit.    Physical Exam BP 124/88 (BP Location: Right Arm, Patient Position: Sitting, Cuff Size: Normal)   Pulse 77   Temp 97.7 F (36.5 C) (Temporal)   Resp 20   Ht 5\' 4"  (1.626 m)   Wt 105 lb (47.6 kg)   SpO2 95% Comment: RA  BMI 18.61 kg/m  69 year old woman in no acute distress Alert and oriented x3 with no focal deficits Lungs diminished at right base but otherwise clear Cardiac regular rate and rhythm normal S1 and S2 Incisions well-healed  Diagnostic Tests: CHEST - 2 VIEW  COMPARISON:  June 26, 2019.  FINDINGS: The heart size and mediastinal contours are within normal limits. Left lung is clear. Postsurgical changes are  noted in the right lung apex. No pneumothorax is noted. Elevated right hemidiaphragm is noted with probable small right pleural effusion and subsegmental atelectasis. The visualized skeletal structures are unremarkable.  IMPRESSION: Postsurgical changes are noted in the right lung apex. Elevated right hemidiaphragm is noted with probable small right pleural effusion and subsegmental atelectasis.  Aortic Atherosclerosis (ICD10-I70.0).   Electronically Signed   By: Marijo Conception [name]   On: 07/31/2019 14:36  I personally reviewed the chest x-ray images and concur with the findings noted above.  Impression: [name] is a 69 year old woman with a history of tobacco abuse, COPD, anxiety, migraines, hypertension, dyslipidemia, and endometriosis.  She now is about 2 months out from a robotic right upper lobectomy for a stage Ia (T1, N0) adenocarcinoma.  Her postoperative course was complicated by prolonged air leak, cardiac arrhythmias, ileus, severe esophagitis, and a gastric ulcer.    She continues to have some neuropathic type pain.  She is on gabapentin 300 mg 3 times daily.  That is still a very low dose.  I am going to increase her to 600 mg 3 times daily.  She can just double up on her current prescription before filling the new one.  She has been using home oxygen.  Her saturations of always been in the 90s even without it.  I do not think she needs home oxygen anymore.  She will keep it around just in case she needs it for about 2 more weeks and then will discontinue altogether.  I think at this point she can start driving.  I asked her to limit herself to short trips and errands around town.  Plan: Increase gabapentin to 600 mg 3 times daily DC home oxygen Return in 6 weeks with PA and lateral chest x-ray She knows to call if she needs to see me sooner  Melrose Nakayama, MD [facility] [phone]

## 2019-08-02 ENCOUNTER — Telehealth (HOSPITAL_COMMUNITY): Payer: Self-pay

## 2019-08-05 NOTE — Progress Notes (Signed)
Cardiology Office Note   Date:  08/06/2019   ID:  [name], DOB [birth year], MRN [id number]  PCP:  Melanie Gravel, MD  Cardiologist:  Dr. Debara Pickett  CC: Post hospitalization follow up.   History of Present Illness: [name] is a 69 y.o. female who presents for for ongoing assessment and management of hypertension, hyperlipidemia, who was seen on consult in February 2021 for cardiac evaluation of wide-complex tachycardia. She was admitted on 05/27/2018 for robotic assisted thoracoscopy with wedge resection of the right upper lobe adenocarcinoma.  She was found to have 22 beat run of wide-complex tachycardia during ambulation consistent with NSVT. It was felt to be secondary to hypokalemia with potassium of 2.6. With repletion the patient had no recurrence of NSVT, and continued on potassium supplementation. She was also given magnesium supplementation due to magnesium 1.7.   She was continued on low-dose beta-blockers to avoid QT prolonging agents and maintain potassium and magnesium during follow-up labs. A coronary CT angio was not planned due to heavy calcifications. Further diagnostic options would be a conventional cardiac cath with coronary angio or nuclear perfusion study. The patient was not interested in either study during hospitalization.  She does not remember very much of her time in the hospital and does not remember refusing cardiac cath. She was on Fentanyl and was weaned off of this prior to discharge. She has stopped smoking.     Past Medical History:  Diagnosis Date  . Active smoker   . Anxiety   . Cervical dysplasia   . Complication of anesthesia    "I'm usually hard to wake up"  . Dyslipidemia   . Endometriosis   . History of nuclear stress test 07/07/2010   dipyridamole; low risk, post-stress EF 65%  . Hypertension   . Migraine   . Osteopenia    08/2006 and 08/2008 Dexa Scans w Isaiah Blakes   . Pneumonia    hx pneunomia ~2016  . PONV (postoperative nausea and  vomiting)   . Sleep apnea    does not wear her CPAP because "it gave me respiratory issues (bronchities and pneumonia)"    Past Surgical History:  Procedure Laterality Date  . BACK SURGERY  2002   RUPTURED DISC   . BIOPSY  06/05/2019   Procedure: BIOPSY;  Surgeon: Carol Ada, MD;  Location: [place];  Service: Endoscopy;;  . CARDIAC CATHETERIZATION  11/20/2002   patent coronary arteries   . CHOLECYSTECTOMY  2006  . COLPOSCOPY    . ESOPHAGOGASTRODUODENOSCOPY Left 06/05/2019   Procedure: ESOPHAGOGASTRODUODENOSCOPY (EGD);  Surgeon: Carol Ada, MD;  Location: [facility];  Service: Endoscopy;  Laterality: Left;  . GYNECOLOGIC CRYOSURGERY    . INTERCOSTAL NERVE BLOCK Right 05/28/2019   Procedure: Intercostal Nerve Block;  Surgeon: Melrose Nakayama, MD;  Location: [facility] [place];  Service: Thoracic;  Laterality: Right;  . NODE DISSECTION  05/28/2019   Procedure: Node Dissection;  Surgeon: Melrose Nakayama, MD;  Location[facility] OR;  Service: Thoracic;;  . NOSE SURGERY     X2  . OOPHORECTOMY     BSO  . SHOULDER SURGERY     ROTATOR CUFF X 2  . THORACOSCOPY  05/28/2019   THORASCOPY-WEDGE RESECTION AND  RIGHT  UPPER LOBECTOMY (Right)  . THROAT SURGERY     REMOVAL OF TUMOR  . TONSILLECTOMY    . VAGINAL HYSTERECTOMY  2000   WITH BSO     Current Outpatient Medications  Medication Sig Dispense Refill  . amLODipine (NORVASC) 5  MG tablet Take 1 tablet (5 mg total) by mouth daily. 90 tablet 1  . aspirin EC 81 MG tablet Take 81 mg by mouth daily.    Marland Kitchen denosumab (PROLIA) 60 MG/ML SOLN injection Inject 60 mg into the skin every 6 (six) months. Administer in upper arm, thigh, or abdomen    . gabapentin (NEURONTIN) 300 MG capsule Take 2 capsules (600 mg total) by mouth 3 (three) times daily. 180 capsule 2  . montelukast (SINGULAIR) 10 MG tablet Take 10 mg by mouth at bedtime.   0  . OVER THE COUNTER MEDICATION "Our Choice probiotic"    . pantoprazole (PROTONIX) 40 MG tablet Take 1 tablet (40  mg total) by mouth 2 (two) times daily. 60 tablet 3  . rosuvastatin (CRESTOR) 10 MG tablet Take 1 tablet (10 mg total) by mouth daily. 90 tablet 1  . traMADol (ULTRAM) 50 MG tablet Take 1 tablet (50 mg total) by mouth every 8 (eight) hours as needed. for moderate pain 21 tablet 0  . VENTOLIN HFA 108 (90 BASE) MCG/ACT inhaler Inhale 2 puffs into the lungs every 6 (six) hours as needed for wheezing or shortness of breath.      No current facility-administered medications for this visit.    Allergies:   Iodinated diagnostic agents, Ioxaglate, Omnipaque [iohexol], Buprenorphine hcl, Codeine, Erythromycin, and Morphine and related    Social History:  The patient  reports that she quit smoking about 2 months ago. Her smoking use included cigarettes. She has a 42.00 pack-year smoking history. She has never used smokeless tobacco. She reports current alcohol use of about 10.0 standard drinks of alcohol per week. She reports that she does not use drugs.   Family History:  The patient's family history includes Cancer in her mother; Heart attack in her maternal grandfather; Heart disease in her brother and maternal grandfather; Hypertension in her brother, maternal grandfather, and mother.    ROS: All other systems are reviewed and negative. Unless otherwise mentioned in H&P    PHYSICAL EXAM: VS:  BP (!) 155/91   Pulse 82   Ht 5\' 4"  (1.626 m)   Wt 108 lb (49 kg)   SpO2 95%   BMI 18.54 kg/m  , BMI Body mass index is 18.54 kg/m. GEN: Well nourished, well developed, in no acute distress HEENT: normal Neck: no JVD, carotid bruits, or masses Cardiac: RRR; no murmurs, rubs, or gallops,no edema  Respiratory:  Clear to auscultation bilaterally, normal work of breathing GI: soft, nontender, nondistended, + BS MS: no deformity or atrophy, Chest wall is healed, no incisional issues.  Skin: warm and dry, no rash Neuro:  Strength and sensation are intact Psych: euthymic mood, full affect   EKG:  Not  completed this office visit.  Recent Labs: July 05, 2019: TSH 2.902 06/09/2019: Magnesium 1.6 07/16/2019: ALT 7; BUN 10; Creatinine 0.72; Hemoglobin 11.4; Platelet Count 246; Potassium 3.8; Sodium 143    Lipid Panel    Component Value Date/Time   CHOL 93 06/08/2019 0116   TRIG 120 06/08/2019 0116   HDL 25 (L) 06/08/2019 0116   CHOLHDL 3.7 06/08/2019 0116   VLDL 24 06/08/2019 0116   LDLCALC 44 06/08/2019 0116      Wt Readings from Last 3 Encounters:  08/06/19 108 lb (49 kg)  07/31/19 105 lb (47.6 kg)  07/16/19 106 lb 14.4 oz (48.5 kg)      Other studies Reviewed: Echocardiogram 07/05/2019 1. Left ventricular ejection fraction, by estimation, is 55 to 60%. The  left ventricle has normal function. The left ventrical has no regional  wall motion abnormalities. Left ventricular diastolic parameters are  consistent with Grade I diastolic  dysfunction (impaired relaxation).  2. Right ventricular systolic function is normal. The right ventricular  size is normal.  3. The mitral valve is normal in structure and function. no evidence of  mitral valve regurgitation. No evidence of mitral stenosis.  4. The aortic valve is normal in structure and function. Aortic valve  regurgitation is not visualized. No aortic stenosis is present.  5. Aortic dilatation noted. There is moderate dilatation of the ascending  aorta measuring 39 mm.    ASSESSMENT AND PLAN:  1. Aortic Atherosclerosis with CAD found on chest imaging. Patient refused cardiac cath during admission. She does not remember doing so.  Will plan a Lexiscan Myoview to assess for ischemia before planning cardiac cath in light of gastric ulcers found during EDG during this hospitalization.  If cath necessary and need for antiplatelet therapy, will need to discuss further.   2. S/P wedge resection: Completed by Dr. Roxan Hockey in the setting adenocarcinoma. Following Dr. Roxan Hockey on May 4.  Do not see referral back to oncology  planned.   3. Hyperlipidemia: Continue on rosuvastatin.   4. Tobacco abuse: Currently in cessation.   5.Gastric Ulcers: Followed by GI. On PPI  Current medicines are reviewed at length with the patient today.  I have spent 25 minutes dedicated to the care of this patient on the date of this encounter to include pre-visit review of records, assessment, management and diagnostic testing,with shared decision making.  Labs/ tests ordered today include: Lexiscan Myoview.   Phill Myron. West Pugh, ANP, AACC   08/06/2019 2:03 PM    [place] [address] Office [phone] Fax [phone]  Notice: This dictation was prepared with Dragon dictation along with smaller phrase technology. Any transcriptional errors that result from this process are unintentional and may not be corrected upon review.

## 2019-08-06 ENCOUNTER — Encounter: Payer: Self-pay | Admitting: Adult Health

## 2019-08-06 ENCOUNTER — Encounter: Payer: Self-pay | Admitting: Internal Medicine

## 2019-08-06 ENCOUNTER — Ambulatory Visit: Payer: Medicare PPO | Admitting: Adult Health

## 2019-08-06 ENCOUNTER — Other Ambulatory Visit: Payer: Self-pay

## 2019-08-06 VITALS — BP 155/91 | HR 82 | Ht 64.0 in | Wt 108.0 lb

## 2019-08-06 DIAGNOSIS — R9389 Abnormal findings on diagnostic imaging of other specified body structures: Secondary | ICD-10-CM

## 2019-08-06 DIAGNOSIS — I10 Essential (primary) hypertension: Secondary | ICD-10-CM | POA: Diagnosis not present

## 2019-08-06 DIAGNOSIS — I700 Atherosclerosis of aorta: Secondary | ICD-10-CM | POA: Diagnosis not present

## 2019-08-06 DIAGNOSIS — Z902 Acquired absence of lung [part of]: Secondary | ICD-10-CM

## 2019-08-06 DIAGNOSIS — R911 Solitary pulmonary nodule: Secondary | ICD-10-CM

## 2019-08-06 DIAGNOSIS — E7800 Pure hypercholesterolemia, unspecified: Secondary | ICD-10-CM

## 2019-08-06 NOTE — Patient Instructions (Signed)
Medication Instructions:  Continue current medications  *If you need a refill on your cardiac medications before your next appointment, please call your pharmacy*   Lab Work: None Ordered   Testing/Procedures: Your physician has requested that you have a lexiscan myoview. For further information please visit [email or web address]. Please follow instruction sheet, as given.  Follow-Up: At [facility], you and your health needs are our priority.  As part of our continuing mission to provide you with exceptional heart care, we have created designated Provider Care Teams.  These Care Teams include your primary Cardiologist (physician) and Advanced Practice Providers (APPs -  Physician Assistants and Nurse Practitioners) who all work together to provide you with the care you need, when you need it.  We recommend signing up for the patient portal called "MyChart".  Sign up information is provided on this After Visit Summary.  MyChart is used to connect with patients for Virtual Visits (Telemedicine).  Patients are able to view lab/test results, encounter notes, upcoming appointments, etc.  Non-urgent messages can be sent to your provider as well.   To learn more about what you can do with MyChart, go to [email or web address].    Your next appointment:   1 month(s)  The format for your next appointment:   In Person  Provider:   K. Mali Hilty, MD

## 2019-08-09 ENCOUNTER — Telehealth (HOSPITAL_COMMUNITY): Payer: Self-pay

## 2019-08-09 NOTE — Telephone Encounter (Signed)
Encounter complete. 

## 2019-08-14 ENCOUNTER — Ambulatory Visit (HOSPITAL_COMMUNITY)
Admission: RE | Admit: 2019-08-14 | Discharge: 2019-08-14 | Disposition: A | Discharge status: Home / Self Care | Payer: Medicare PPO | Source: Ambulatory Visit | Attending: Cardiovascular Disease | Admitting: Cardiovascular Disease

## 2019-08-14 ENCOUNTER — Other Ambulatory Visit: Payer: Self-pay

## 2019-08-14 DIAGNOSIS — I700 Atherosclerosis of aorta: Secondary | ICD-10-CM | POA: Insufficient documentation

## 2019-08-14 DIAGNOSIS — Z8249 Family history of ischemic heart disease and other diseases of the circulatory system: Secondary | ICD-10-CM | POA: Diagnosis not present

## 2019-08-14 DIAGNOSIS — Z902 Acquired absence of lung [part of]: Secondary | ICD-10-CM | POA: Diagnosis not present

## 2019-08-14 DIAGNOSIS — R0609 Other forms of dyspnea: Secondary | ICD-10-CM | POA: Diagnosis not present

## 2019-08-14 DIAGNOSIS — J449 Chronic obstructive pulmonary disease, unspecified: Secondary | ICD-10-CM | POA: Diagnosis not present

## 2019-08-14 DIAGNOSIS — Z85118 Personal history of other malignant neoplasm of bronchus and lung: Secondary | ICD-10-CM | POA: Insufficient documentation

## 2019-08-14 DIAGNOSIS — Z87891 Personal history of nicotine dependence: Secondary | ICD-10-CM | POA: Diagnosis not present

## 2019-08-14 DIAGNOSIS — R9389 Abnormal findings on diagnostic imaging of other specified body structures: Secondary | ICD-10-CM | POA: Diagnosis not present

## 2019-08-14 DIAGNOSIS — R002 Palpitations: Secondary | ICD-10-CM | POA: Diagnosis not present

## 2019-08-14 DIAGNOSIS — I10 Essential (primary) hypertension: Secondary | ICD-10-CM | POA: Insufficient documentation

## 2019-08-14 LAB — MYOCARDIAL PERFUSION IMAGING
LV dias vol: 79 mL (ref 46–106)
LV sys vol: 33 mL
Peak HR: 90 {beats}/min
Rest HR: 68 {beats}/min
SDS: 0
SRS: 2
SSS: 2
TID: 1.12

## 2019-08-14 MED ORDER — REGADENOSON 0.4 MG/5ML IV SOLN
0.4000 mg | Freq: Once | INTRAVENOUS | Status: AC
  Administered 2019-08-14: 0.4 mg via INTRAVENOUS

## 2019-08-14 MED ORDER — TECHNETIUM TC 99M TETROFOSMIN IV KIT
10.8000 | PACK | Freq: Once | INTRAVENOUS | Status: AC | PRN
  Administered 2019-08-14: 10.8 via INTRAVENOUS
  Filled 2019-08-14: qty 11

## 2019-08-14 MED ORDER — TECHNETIUM TC 99M TETROFOSMIN IV KIT
30.2000 | PACK | Freq: Once | INTRAVENOUS | Status: AC | PRN
  Administered 2019-08-14: 30.2 via INTRAVENOUS
  Filled 2019-08-14: qty 31

## 2019-09-10 ENCOUNTER — Ambulatory Visit: Payer: Medicare PPO | Admitting: Internal Medicine

## 2019-09-10 ENCOUNTER — Encounter: Payer: Self-pay | Admitting: Internal Medicine

## 2019-09-10 ENCOUNTER — Other Ambulatory Visit: Payer: Self-pay | Admitting: Thoracic Surgery (Cardiothoracic Vascular Surgery)

## 2019-09-10 ENCOUNTER — Other Ambulatory Visit: Payer: Self-pay

## 2019-09-10 VITALS — BP 112/72 | HR 76 | Ht 64.0 in | Wt 112.0 lb

## 2019-09-10 DIAGNOSIS — E7800 Pure hypercholesterolemia, unspecified: Secondary | ICD-10-CM

## 2019-09-10 DIAGNOSIS — I10 Essential (primary) hypertension: Secondary | ICD-10-CM

## 2019-09-10 DIAGNOSIS — Z902 Acquired absence of lung [part of]: Secondary | ICD-10-CM

## 2019-09-10 DIAGNOSIS — I700 Atherosclerosis of aorta: Secondary | ICD-10-CM

## 2019-09-10 MED ORDER — ROSUVASTATIN CALCIUM 10 MG PO TABS: 5.0000 mg | ORAL_TABLET | Freq: Every day | ORAL | 1 refills | Status: DC

## 2019-09-10 MED ORDER — TELMISARTAN-HCTZ 80-12.5 MG PO TABS: 1.0000 | ORAL_TABLET | Freq: Every day | ORAL | 3 refills | Status: DC

## 2019-09-10 NOTE — Patient Instructions (Signed)
Medication Instructions:  START metoprolol succinate 25mg  daily (take half of 50mg  tablet) DECREASE amlodipine to 2.5mg  daily DECREASE crestor to 5mg  daily (take half of 10mg  tablet)  *If you need a refill on your cardiac medications before your next appointment, please call your pharmacy*   Follow-Up: At [facility], you and your health needs are our priority.  As part of our continuing mission to provide you with exceptional heart care, we have created designated Provider Care Teams.  These Care Teams include your primary Cardiologist (physician) and Advanced Practice Providers (APPs -  Physician Assistants and Nurse Practitioners) who all work together to provide you with the care you need, when you need it.  We recommend signing up for the patient portal called "MyChart".  Sign up information is provided on this After Visit Summary.  MyChart is used to connect with patients for Virtual Visits (Telemedicine).  Patients are able to view lab/test results, encounter notes, upcoming appointments, etc.  Non-urgent messages can be sent to your provider as well.   To learn more about what you can do with MyChart, go to [email or web address].    Your next appointment:   6 month(s)  The format for your next appointment:   In Person  Provider:   You may see Pixie Casino, MD or one of the following Advanced Practice Providers on your designated Care Team:    Almyra Deforest, PA-C  Fabian Sharp, PA-C or   Roby Lofts, [place]    Other Instructions

## 2019-09-10 NOTE — Progress Notes (Signed)
OFFICE NOTE  Chief Complaint:  Joint aches and pains  Primary Care Physician: Jani Gravel, MD  HPI:  [name] is a 69 year old female I have been following for anxiety as well as difficult to control hypertension. Blood pressure has been much better controlled on her current regimen of Micardis/HCTZ as well as the metoprolol. She is also on Crestor for dyslipidemia and has had a well-controlled lipid profile. Recently, she has been concerned about fast intestinal transits, diarrhea, and/or stomach gurgling. She takes cholestyramine for this and was started on Dexilant for reflux-type symptoms. Although this has been helpful, she continues to have some of those complaints, but no other active cardiac complaints. Unfortunately, she continues to smoke. We discussed smoking cessation today. However, she is not quite ready to quit due to stress in her job. Fortunately her boss quit in the past year and her stress is improved significantly. She tells her she has 2 more years left and then she can retire with attention. Overall she thinks she is doing very well. The only other new issue is that she broke a small bone in the left foot but that has healed up very quickly.  [name] returns today for followup. She reports having some upper midepigastric pain but has not been taking her PPI regularly. Just reports not taking her cholesterol medication regularly. She doesn't be taken her blood pressure medicines and her blood pressure is well-controlled today.  I saw [name] back today in follow-up. She tells me that she is only a few days away from retirement. Unfortunately there'll be a 3-4 week delay in insurance coverage before she can start with her new state insurance. She was told that she could get insurance coverage from [employer] however the cost would be more than $800. From a medical standpoint she seems to be doing well and is interested in quitting cigarettes. She got a prescription for  Chantix but has not yet started. Blood pressure is well-controlled. She has had good cholesterol control on Crestor.  03/26/2016  [name] returns today for follow-up. Blood pressure appears to be well-controlled. She denies any chest pain, but has had some worsening fatigue and dyspnea. She says she gets short of breath walking up stairs and has had recent decreased exercise tolerance.   03/25/2017  [name] was seen today in follow-up.  She recently won a trip to [place] and unfortunately fell and fractured 3 ribs.  She seems to be recovering from that.  She is not taking pain medicine regularly.  She has had some nausea and shortness of breath which is consistent.  She had a stress test last year which was negative for ischemia and showed normal LV function.  She denies any worsening or new symptoms associated with that.  04/06/2018  [name] is seen today in follow-up.  Overall she is doing well.  She denies any chest pain or worsening shortness of breath.  Her EKG, personally reviewed today shows some anteroseptal T wave inversions which are slightly worse than previously.  Despite this she is completely asymptomatic.  She is trying to work on smoking cessation.  She says she still struggles with sleep at night.  Of note her weight has been declining.  She is borderline underweight today.  Labs from June 2019 showed total cholesterol 140, HDL 56, LDL 66 and triglycerides 92.  Creatinine was normal.  04/09/2019  [name] returns today for follow-up.  Her weight is down a little more.  She continues to smoke and recently has reported increasing productive cough and phlegm.  I was reviewing old records and indicated that she had a CT scan in 2012 of the chest which showed emphysematous disease and some pulmonary nodules.  I do not see any follow-up of that in our system nor even a chest x-ray.  Is not clear whether she has had follow-up with this with her primary care provider.  She continues  to smoke and her mother actually died of lung cancer.  She also told me that her brother recently died.  He has been in the hospital couple times with coronary disease and had a bypass.  Blood pressure is actually low today.  Recently she was seen by Doreene Adas, PA-C, who restarted Toprol.  Apparently she was not taking this medicine but restarted it however is also on amlodipine in combination telmisartan/HCTZ.  Blood pressure today is accordingly low.  09/10/2019  [name] is seen today in follow-up.  She had an extensive hospitalization after we identified a primary lung tumor.  She had wedge resection of this with a protracted hospitalization including an episode of ventricular tachycardia which was thought to be related to severe electrolyte derangement.  An echo was performed which showed normal systolic function.  She declined heart catheterization and ultimately underwent outpatient stress testing which was negative for ischemia.  She said she has been fairly slow to recover and continues to have some neuropathic symptoms as well as joint aches and pains.  Recently her blood pressure started climbing higher.  She was taken off of her telmisartan/HCTZ which I advised restarting.  Blood pressure today is excellent at 112/72.  Recent lipids in February showed total cholesterol 93, triglycerides 120, HDL 25 and LDL 44 which seemed to be more than adequately controlled on rosuvastatin 10 mg daily.  Her beta-blocker was stopped during her hospitalization apparently.  PMHx:  Past Medical History:  Diagnosis Date  . Active smoker   . Anxiety   . Cervical dysplasia   . Complication of anesthesia    "I'm usually hard to wake up"  . Dyslipidemia   . Endometriosis   . History of nuclear stress test 07/07/2010   dipyridamole; low risk, post-stress EF 65%  . Hypertension   . Migraine   . Osteopenia    08/2006 and 08/2008 Dexa Scans w Isaiah Blakes   . Pneumonia    hx pneunomia ~2016  . PONV  (postoperative nausea and vomiting)   . Sleep apnea    does not wear her CPAP because "it gave me respiratory issues (bronchities and pneumonia)"    Past Surgical History:  Procedure Laterality Date  . BACK SURGERY  2002   RUPTURED DISC   . BIOPSY  06/05/2019   Procedure: BIOPSY;  Surgeon: Carol Ada, MD;  Location: [place];  Service: Endoscopy;;  . CARDIAC CATHETERIZATION  11/20/2002   patent coronary arteries   . CHOLECYSTECTOMY  2006  . COLPOSCOPY    . ESOPHAGOGASTRODUODENOSCOPY Left 06/05/2019   Procedure: ESOPHAGOGASTRODUODENOSCOPY (EGD);  Surgeon: Carol Ada, MD;  Location: [place];  Service: Endoscopy;  Laterality: Left;  . GYNECOLOGIC CRYOSURGERY    . INTERCOSTAL NERVE BLOCK Right 05/28/2019   Procedure: Intercostal Nerve Block;  Surgeon: Melrose Nakayama, MD;  Location: [place];  Service: Thoracic;  Laterality: Right;  . NODE DISSECTION  05/28/2019   Procedure: Node Dissection;  Surgeon: Melrose Nakayama, MD;  Location: [facility];  Service: Thoracic;;  . NOSE SURGERY  X2  . OOPHORECTOMY     BSO  . SHOULDER SURGERY     ROTATOR CUFF X 2  . THORACOSCOPY  05/28/2019   THORASCOPY-WEDGE RESECTION AND  RIGHT  UPPER LOBECTOMY (Right)  . THROAT SURGERY     REMOVAL OF TUMOR  . TONSILLECTOMY    . VAGINAL HYSTERECTOMY  2000   WITH BSO    FAMHx:  Family History  Problem Relation Age of Onset  . Hypertension Mother   . Cancer Mother        LIVER AND LUNG  . Hypertension Brother   . Heart disease Brother   . Hypertension Maternal Grandfather   . Heart disease Maternal Grandfather   . Heart attack Maternal Grandfather     SOCHx:   reports that she quit smoking about 3 months ago. Her smoking use included cigarettes. She has a 42.00 pack-year smoking history. She has never used smokeless tobacco. She reports current alcohol use of about 10.0 standard drinks of alcohol per week. She reports that she does not use drugs.  ALLERGIES:  Allergies  Allergen  Reactions  . Iodinated Diagnostic Agents Itching    itching but no hives just after iv contrast injection w/ 13 hr prep, future contrast not advised unless absolutely necessary, 13 hr prep would still be advised.//a.calhoun  . Ioxaglate Itching    itching but no hives just after iv contrast injection w/ 13 hr prep, future contrast not advised unless absolutely necessary, 13 hr prep would still be advised.//a.calhoun  . Omnipaque [Iohexol] Anaphylaxis  . Buprenorphine Hcl Nausea And Vomiting  . Codeine Nausea And Vomiting  . Erythromycin   . Morphine And Related Nausea And Vomiting    ROS: Pertinent items noted in HPI and remainder of comprehensive ROS otherwise negative.  HOME MEDS: Current Outpatient Medications  Medication Sig Dispense Refill  . amLODipine (NORVASC) 5 MG tablet Take 2.5 mg by mouth daily.    Marland Kitchen aspirin EC 81 MG tablet Take 81 mg by mouth daily.    Marland Kitchen denosumab (PROLIA) 60 MG/ML SOLN injection Inject 60 mg into the skin every 6 (six) months. Administer in upper arm, thigh, or abdomen    . gabapentin (NEURONTIN) 300 MG capsule Take 2 capsules (600 mg total) by mouth 3 (three) times daily. 180 capsule 2  . montelukast (SINGULAIR) 10 MG tablet Take 10 mg by mouth at bedtime.   0  . OVER THE COUNTER MEDICATION "Our Choice probiotic"    . pantoprazole (PROTONIX) 40 MG tablet Take 1 tablet (40 mg total) by mouth 2 (two) times daily. 60 tablet 3  . rosuvastatin (CRESTOR) 10 MG tablet Take 1 tablet (10 mg total) by mouth daily. 90 tablet 1  . telmisartan-hydrochlorothiazide (MICARDIS HCT) 80-12.5 MG tablet Take 1 tablet by mouth daily.    . VENTOLIN HFA 108 (90 BASE) MCG/ACT inhaler Inhale 2 puffs into the lungs every 6 (six) hours as needed for wheezing or shortness of breath.      No current facility-administered medications for this visit.    LABS/IMAGING: No results found for this or any previous visit (from the past 48 hour(s)). No results found.  VITALS: BP 112/72    Pulse 76   Ht 5\' 4"  (1.626 m)   Wt 112 lb (50.8 kg)   SpO2 94%   BMI 19.22 kg/m   EXAM: General appearance: alert and no distress Neck: no adenopathy, no carotid bruit, no JVD, supple, symmetrical, trachea midline and thyroid not enlarged, symmetric, no tenderness/mass/nodules  Lungs: clear to auscultation bilaterally Heart: regular rate and rhythm, S1, S2 normal, no murmur, click, rub or gallop Abdomen: soft, non-tender; bowel sounds normal; no masses,  no organomegaly Extremities: extremities normal, atraumatic, no cyanosis or edema Pulses: 2+ and symmetric Skin: Skin color, texture, turgor normal. No rashes or lesions Neurologic: Grossly normal  EKG: Sinus rhythm with PACs at 85, inferolateral ST and T wave changes-personally reviewed  ASSESSMENT:  1. Dyspnea on exertion and fatigue-low risk nuclear stress test (03/2016) 2. Hypertension-controlled 3. Hyperlipidemia- on Crestor 4. Tobacco abuse 5. History of lung nodules/emphysema 6. Primary lung tumor status post resection (04/2019) 7. History of NSVT related to electrolyte abnormalities  PLAN: 1.   [name] had resection of a primary lung tumor that was noted with CT and is recovering.  She is reporting some joint aches and pains as well as neuropathic symptoms.  Her LDL cholesterol is more than adequately treated and therefore I think we could further decrease her rosuvastatin 5 mg daily, perhaps this will help some with the symptoms.  She is restarting her telmisartan HCTZ with better blood pressure control.  Because of the ventricular arrhythmias during her hospitalization, I would advise she go back on to her beta-blocker and would start Toprol-XL at 25 mg rather than 50 mg daily.  We will decrease her amlodipine to 2.5 mg daily.  Plan follow-up with me in 6 months or sooner as necessary.  Pixie Casino, MD, [facility] [place] Director of the Advanced Lipid Disorders &  Cardi[facility]  [facility] Attending Cardiologist  Direct Dial: [phone]  Fax: [phone]  Website:  [email or web address] 09/10/2019, 12:04 PM

## 2019-09-11 ENCOUNTER — Ambulatory Visit: Payer: Medicare PPO | Admitting: Thoracic Surgery (Cardiothoracic Vascular Surgery)

## 2019-09-11 ENCOUNTER — Ambulatory Visit
Admission: RE | Admit: 2019-09-11 | Discharge: 2019-09-11 | Disposition: A | Discharge status: Home / Self Care | Payer: Medicare PPO | Source: Ambulatory Visit | Attending: Thoracic Surgery (Cardiothoracic Vascular Surgery) | Admitting: Thoracic Surgery (Cardiothoracic Vascular Surgery)

## 2019-09-11 ENCOUNTER — Encounter: Payer: Self-pay | Admitting: Thoracic Surgery (Cardiothoracic Vascular Surgery)

## 2019-09-11 VITALS — BP 120/72 | HR 76 | Temp 97.6°F | Resp 20 | Ht 64.0 in | Wt 109.0 lb

## 2019-09-11 DIAGNOSIS — C3491 Malignant neoplasm of unspecified part of right bronchus or lung: Secondary | ICD-10-CM | POA: Diagnosis not present

## 2019-09-11 DIAGNOSIS — R0602 Shortness of breath: Secondary | ICD-10-CM | POA: Diagnosis not present

## 2019-09-11 DIAGNOSIS — Z902 Acquired absence of lung [part of]: Secondary | ICD-10-CM

## 2019-09-11 NOTE — Progress Notes (Signed)
[address]       [place] [address]             [phone]      HPI: [name] returns for scheduled follow-up visit  [name] is a 69 year old woman with a history of tobacco abuse, COPD, anxiety, migraines, hypertension, dyslipidemia, endometriosis, and lung cancer.  She had a robotic right upper lobectomy for a T1, N0, stage Ia adenocarcinoma on May 28, 2019.  Her postoperative course was complicated by a prolonged air leak, esophagitis, gastric ulcer, and wide-complex tachycardia.  I last saw her in the office on July 31, 2019.  She had a lot of neuropathic pain initially after surgery.  It was improving at that time and she no longer was using fentanyl patch, but was still taking tramadol occasionally.  In the interim since her last visit she had a stress test and saw Dr. Debara Pickett.  Her stress test was normal.  She still occasionally will have a sharp pain where the chest tube was.  She says a lot of times it happens at night when she is not guarding against it.  She is not taking any narcotics.  She is still on 600 mg of gabapentin 3 times daily.  She says she has recently been having pain in her wrists ankles and hips.  Past Medical History:  Diagnosis Date  . Active smoker   . Anxiety   . Cervical dysplasia   . Complication of anesthesia    "I'm usually hard to wake up"  . Dyslipidemia   . Endometriosis   . History of nuclear stress test 07/07/2010   dipyridamole; low risk, post-stress EF 65%  . Hypertension   . Migraine   . Osteopenia    08/2006 and 08/2008 Dexa Scans w Isaiah Blakes   . Pneumonia    hx pneunomia ~2016  . PONV (postoperative nausea and vomiting)   . Sleep apnea    does not wear her CPAP because "it gave me respiratory issues (bronchities and pneumonia)"    Current Outpatient Medications  Medication Sig Dispense Refill  . amLODipine (NORVASC) 5 MG tablet Take 2.5 mg by mouth daily.    Marland Kitchen aspirin EC 81 MG tablet Take 81 mg  by mouth daily.    Marland Kitchen denosumab (PROLIA) 60 MG/ML SOLN injection Inject 60 mg into the skin every 6 (six) months. Administer in upper arm, thigh, or abdomen    . gabapentin (NEURONTIN) 300 MG capsule Take 2 capsules (600 mg total) by mouth 3 (three) times daily. 180 capsule 2  . metoprolol succinate (TOPROL-XL) 50 MG 24 hr tablet Take 25 mg by mouth daily. Take with or immediately following a meal.    . montelukast (SINGULAIR) 10 MG tablet Take 10 mg by mouth at bedtime.   0  . OVER THE COUNTER MEDICATION "Our Choice probiotic"    . pantoprazole (PROTONIX) 40 MG tablet Take 1 tablet (40 mg total) by mouth 2 (two) times daily. 60 tablet 3  . rosuvastatin (CRESTOR) 10 MG tablet Take 0.5 tablets (5 mg total) by mouth daily. 90 tablet 1  . telmisartan-hydrochlorothiazide (MICARDIS HCT) 80-12.5 MG tablet Take 1 tablet by mouth daily. 90 tablet 3  . VENTOLIN HFA 108 (90 BASE) MCG/ACT inhaler Inhale 2 puffs into the lungs every 6 (six) hours as needed for wheezing or shortness of breath.      No current facility-administered medications for this visit.    Physical Exam BP  120/72   Pulse 76   Temp 97.6 F (36.4 C) (Skin)   Resp 20   Ht 5\' 4"  (1.626 m)   Wt 109 lb (49.4 kg)   SpO2 90% Comment: RA  BMI 18.54 kg/m  69 year old woman in no acute distress Alert and oriented x3 with no focal deficits Lungs diminished at right base but otherwise clear Cardiac regular rate and rhythm normal S1 and S2 Incisions well-healed  Diagnostic Tests: CHEST - 2 VIEW  COMPARISON:  July 31, 2019.  FINDINGS: The heart size and mediastinal contours are within normal limits. Left lung is clear. Status post right upper lobectomy. Elevated right hemidiaphragm is noted with minimal right basilar subsegmental atelectasis or scarring. No pneumothorax is noted. The visualized skeletal structures are unremarkable.  IMPRESSION: Status post right upper lobectomy. Elevated right hemidiaphragm is noted with  minimal right basilar subsegmental atelectasis or scarring.  Aortic Atherosclerosis (ICD10-I70.0).   Electronically Signed   By: Marijo Conception [name]   On: 09/11/2019 14:18 I personally reviewed the chest x-ray images and concur with the findings noted above  Impression: [name] is a 69 year old woman with a history of tobacco abuse, COPD, anxiety, migraines, hypertension, dyslipidemia, endometriosis, and lung cancer.  She had a robotic right upper lobectomy for a T1, N0, stage Ia adenocarcinoma on May 28, 2019.  Her postoperative course was complicated by a prolonged air leak, esophagitis, gastric ulcer, and wide-complex tachycardia.  Neuropathic pain-has improved dramatically.  She is now off narcotics.  She does still have some pain which she may always have to some degree.  I do not know what to make of her joint pain has come on in the past couple of weeks.  She started back on some medications that she had been off but no medications that are completely new to her.  If that persists she should follow-up with her primary.  For now we will continue with 600 mg of gabapentin 3 times daily.  Stage Ia adenocarcinoma-scheduled to see Dr. Julien Nordmann in September with a CT of the chest.  I will see her back around that time.  Plan: Return in 4 months (after CT chest) She will call if she needs to be seen sooner  Melrose Nakayama, MD [facility] [phone]

## 2019-09-14 ENCOUNTER — Telehealth: Payer: Self-pay

## 2019-09-14 NOTE — Telephone Encounter (Signed)
[employer]Cancer coverage claim form completed and Faxed to [phone] per patient/ original form mailed to patient's address.

## 2019-10-17 ENCOUNTER — Other Ambulatory Visit: Payer: Self-pay | Admitting: Internal Medicine

## 2019-11-11 ENCOUNTER — Other Ambulatory Visit: Payer: Self-pay | Admitting: Thoracic Surgery (Cardiothoracic Vascular Surgery)

## 2019-11-12 ENCOUNTER — Telehealth: Payer: Self-pay

## 2019-11-12 NOTE — Telephone Encounter (Signed)
Prescription refill per Dr. Roxan Hockey

## 2019-11-12 NOTE — Telephone Encounter (Signed)
-----   Message from Melrose Nakayama, MD sent at 11/12/2019  3:31 PM EDT ----- Regarding: RE: RX Refill yes ----- Message ----- From: Donnella Sham, RN Sent: 11/12/2019   9:59 AM EDT To: Melrose Nakayama, MD Subject: RX Refill                                      Hey,  She is requesting a refill of Neurontin.  To see you back in September. Would you like me to refill the medication?  Thanks,  Caryl Pina

## 2019-12-13 DIAGNOSIS — Z03818 Encounter for observation for suspected exposure to other biological agents ruled out: Secondary | ICD-10-CM | POA: Diagnosis not present

## 2019-12-14 ENCOUNTER — Other Ambulatory Visit: Payer: Medicare PPO

## 2019-12-14 DIAGNOSIS — Z03818 Encounter for observation for suspected exposure to other biological agents ruled out: Secondary | ICD-10-CM | POA: Diagnosis not present

## 2020-01-09 ENCOUNTER — Telehealth: Payer: Self-pay | Admitting: Internal Medicine

## 2020-01-09 NOTE — Telephone Encounter (Signed)
R/s 9/21 to earlier time due to provider being on call. Called and left msg with call back number

## 2020-01-14 ENCOUNTER — Ambulatory Visit (HOSPITAL_COMMUNITY)
Admission: RE | Admit: 2020-01-14 | Discharge: 2020-01-14 | Disposition: A | Discharge status: Home / Self Care | Payer: Medicare PPO | Source: Ambulatory Visit | Attending: Internal Medicine | Admitting: Internal Medicine

## 2020-01-14 ENCOUNTER — Other Ambulatory Visit: Payer: Self-pay

## 2020-01-14 ENCOUNTER — Inpatient Hospital Stay: Payer: Medicare PPO | Attending: Physician Assistant

## 2020-01-14 DIAGNOSIS — K529 Noninfective gastroenteritis and colitis, unspecified: Secondary | ICD-10-CM | POA: Diagnosis not present

## 2020-01-14 DIAGNOSIS — F17290 Nicotine dependence, other tobacco product, uncomplicated: Secondary | ICD-10-CM | POA: Diagnosis not present

## 2020-01-14 DIAGNOSIS — I700 Atherosclerosis of aorta: Secondary | ICD-10-CM | POA: Diagnosis not present

## 2020-01-14 DIAGNOSIS — C3490 Malignant neoplasm of unspecified part of unspecified bronchus or lung: Secondary | ICD-10-CM

## 2020-01-14 DIAGNOSIS — I712 Thoracic aortic aneurysm, without rupture: Secondary | ICD-10-CM | POA: Diagnosis not present

## 2020-01-14 DIAGNOSIS — I2510 Atherosclerotic heart disease of native coronary artery without angina pectoris: Secondary | ICD-10-CM | POA: Diagnosis not present

## 2020-01-14 DIAGNOSIS — Z85118 Personal history of other malignant neoplasm of bronchus and lung: Secondary | ICD-10-CM | POA: Diagnosis not present

## 2020-01-14 DIAGNOSIS — Z902 Acquired absence of lung [part of]: Secondary | ICD-10-CM | POA: Diagnosis not present

## 2020-01-14 DIAGNOSIS — J439 Emphysema, unspecified: Secondary | ICD-10-CM | POA: Diagnosis not present

## 2020-01-14 LAB — CMP (CANCER CENTER ONLY)
ALT: 20 U/L (ref 0–44)
AST: 23 U/L (ref 15–41)
Albumin: 3.8 g/dL (ref 3.5–5.0)
Alkaline Phosphatase: 80 U/L (ref 38–126)
Anion gap: 6 (ref 5–15)
BUN: 10 mg/dL (ref 8–23)
CO2: 31 mmol/L (ref 22–32)
Calcium: 9.8 mg/dL (ref 8.9–10.3)
Chloride: 97 mmol/L — ABNORMAL LOW (ref 98–111)
Creatinine: 0.75 mg/dL (ref 0.44–1.00)
GFR, Est AFR Am: >60 mL/min (ref >60)
GFR, Estimated: >60 mL/min (ref >60)
Glucose, Bld: 93 mg/dL (ref 70–99)
Potassium: 3.8 mmol/L (ref 3.5–5.1)
Sodium: 134 mmol/L — ABNORMAL LOW (ref 135–145)
Total Bilirubin: 0.5 mg/dL (ref 0.3–1.2)
Total Protein: 7.4 g/dL (ref 6.5–8.1)

## 2020-01-14 LAB — CBC WITH DIFFERENTIAL (CANCER CENTER ONLY)
Abs Immature Granulocytes: 0.02 10*3/uL (ref 0.00–0.07)
Basophils Absolute: 0.1 10*3/uL (ref 0.0–0.1)
Basophils Relative: 1 %
Eosinophils Absolute: 0.2 10*3/uL (ref 0.0–0.5)
Eosinophils Relative: 2 %
HCT: 46.4 % — ABNORMAL HIGH (ref 36.0–46.0)
Hemoglobin: 15.2 g/dL — ABNORMAL HIGH (ref 12.0–15.0)
Immature Granulocytes: 0 %
Lymphocytes Relative: 31 %
Lymphs Abs: 2.3 10*3/uL (ref 0.7–4.0)
MCH: 31.9 pg (ref 26.0–34.0)
MCHC: 32.8 g/dL (ref 30.0–36.0)
MCV: 97.3 fL (ref 80.0–100.0)
Monocytes Absolute: 0.6 10*3/uL (ref 0.1–1.0)
Monocytes Relative: 8 %
Neutro Abs: 4.4 10*3/uL (ref 1.7–7.7)
Neutrophils Relative %: 58 %
Platelet Count: 241 10*3/uL (ref 150–400)
RBC: 4.77 MIL/uL (ref 3.87–5.11)
RDW: 14.3 % (ref 11.5–15.5)
WBC Count: 7.5 10*3/uL (ref 4.0–10.5)
nRBC: 0 % (ref 0.0–0.2)

## 2020-01-14 MED ORDER — ROSUVASTATIN CALCIUM 5 MG PO TABS: 5.0000 mg | ORAL_TABLET | Freq: Every day | ORAL | 3 refills | Status: DC

## 2020-01-14 MED ORDER — ROSUVASTATIN CALCIUM 10 MG PO TABS: 5.0000 mg | ORAL_TABLET | Freq: Every day | ORAL | 2 refills | Status: DC

## 2020-01-15 ENCOUNTER — Ambulatory Visit: Payer: Medicare PPO | Admitting: Internal Medicine

## 2020-01-15 NOTE — Progress Notes (Signed)
[facility] OFFICE PROGRESS NOTE  Melanie Gravel, MD [address] [place] [address]  DIAGNOSIS: Stage Ia (T1b, N0, M0) non-small cell lung cancer, adenocarcinoma presented with right upper lobe nodule  PRIOR THERAPY: Status post left upper lobectomy with lymph node dissection under the care of Dr. Roxan Hockey on May 28, 2019.  CURRENT THERAPY: Observation  INTERVAL HISTORY: [name] 69 y.o. female returns to the clinic for a follow up visit. The patient is feeling well today without any concerning complaints except she notes that since around the time of her diagnosis of lung cancer/lobectomy that she has a usual sensation near her right eye that she describes as pressing. She has not seen an eye doctor in 4 year and is in the process of trying to find a new one. She also notes that she has had occasional dizziness/change in balance the last few days. Denies any fever, chills, night sweats, or weight loss. Denies any chest pain or hemoptysis. She reports baseline dyspnea on exertion such as taking the stairs. Denies any nausea, vomiting, or constipation. She has chronic diarrhea due to colitis. Denies any headache, seizures, or extremity weakness. The patient had quit smoking for 5 months but started smoking again about 1 year ago after the death of her brother. She is interested in quitting and is requesting a prescription for nicoderm patches. She is also requesting a refill of her inhaler until she can make an appointment with another provider at her PCP office due to her provider being out of the office. The patient recently had a restaging CT scan performed.  The patient is here today for evaluation and to review her scan results.     MEDICAL HISTORY: Past Medical History:  Diagnosis Date  . Active smoker   . Anxiety   . Cervical dysplasia   . Complication of anesthesia    "I'm usually hard to wake up"  . Dyslipidemia   . Endometriosis   .  History of nuclear stress test 07/07/2010   dipyridamole; low risk, post-stress EF 65%  . Hypertension   . Migraine   . Osteopenia    08/2006 and 08/2008 Dexa Scans w Isaiah Blakes   . Pneumonia    hx pneunomia ~2016  . PONV (postoperative nausea and vomiting)   . Sleep apnea    does not wear her CPAP because "it gave me respiratory issues (bronchities and pneumonia)"    ALLERGIES:  is allergic to iodinated diagnostic agents, ioxaglate, omnipaque [iohexol], buprenorphine hcl, codeine, erythromycin, and morphine and related.  MEDICATIONS:  Current Outpatient Medications  Medication Sig Dispense Refill  . cholestyramine (QUESTRAN) 4 GM/DOSE powder Take 4 g by mouth daily.    Marland Kitchen amLODipine (NORVASC) 2.5 MG tablet Take 1 tablet (2.5 mg total) by mouth daily. 90 tablet 1  . aspirin EC 81 MG tablet Take 81 mg by mouth daily.    Marland Kitchen denosumab (PROLIA) 60 MG/ML SOLN injection Inject 60 mg into the skin every 6 (six) months. Administer in upper arm, thigh, or abdomen    . gabapentin (NEURONTIN) 300 MG capsule TAKE 2 CAPSULES(600 MG) BY MOUTH THREE TIMES DAILY 180 capsule 2  . metoprolol succinate (TOPROL-XL) 50 MG 24 hr tablet Take 25 mg by mouth daily. Take with or immediately following a meal.    . montelukast (SINGULAIR) 10 MG tablet Take 10 mg by mouth at bedtime.   0  . nicotine (NICODERM CQ - DOSED IN MG/24 HOURS) 21 mg/24hr patch  Place 1 patch (21 mg total) onto the skin daily. 28 patch 1  . OVER THE COUNTER MEDICATION "Our Choice probiotic"    . pantoprazole (PROTONIX) 40 MG tablet Take 1 tablet (40 mg total) by mouth 2 (two) times daily. 60 tablet 3  . rosuvastatin (CRESTOR) 5 MG tablet Take 1 tablet (5 mg total) by mouth daily. 90 tablet 3  . telmisartan-hydrochlorothiazide (MICARDIS HCT) 80-12.5 MG tablet Take 1 tablet by mouth daily. 90 tablet 3  . VENTOLIN HFA 108 (90 BASE) MCG/ACT inhaler Inhale 2 puffs into the lungs every 6 (six) hours as needed for wheezing or shortness of breath.       No current facility-administered medications for this visit.    SURGICAL HISTORY:  Past Surgical History:  Procedure Laterality Date  . BACK SURGERY  2002   RUPTURED DISC   . BIOPSY  06/05/2019   Procedure: BIOPSY;  Surgeon: Carol Ada, MD;  Location: [facility];  Service: Endoscopy;;  . CARDIAC CATHETERIZATION  11/20/2002   patent coronary arteries   . CHOLECYSTECTOMY  2006  . COLPOSCOPY    . ESOPHAGOGASTRODUODENOSCOPY Left 06/05/2019   Procedure: ESOPHAGOGASTRODUODENOSCOPY (EGD);  Surgeon: Carol Ada, MD;  Location: [facility];  Service: Endoscopy;  Laterality: Left;  . GYNECOLOGIC CRYOSURGERY    . INTERCOSTAL NERVE BLOCK Right 05/28/2019   Procedure: Intercostal Nerve Block;  Surgeon: Melrose Nakayama, MD;  Location: [facility];  Service: Thoracic;  Laterality: Right;  . NODE DISSECTION  05/28/2019   Procedure: Node Dissection;  Surgeon: Melrose Nakayama, MD;  Location[facility];  Service: Thoracic;;  . NOSE SURGERY     X2  . OOPHORECTOMY     BSO  . SHOULDER SURGERY     ROTATOR CUFF X 2  . THORACOSCOPY  05/28/2019   THORASCOPY-WEDGE RESECTION AND  RIGHT  UPPER LOBECTOMY (Right)  . THROAT SURGERY     REMOVAL OF TUMOR  . TONSILLECTOMY    . VAGINAL HYSTERECTOMY  2000   WITH BSO    REVIEW OF SYSTEMS:   Review of Systems  Constitutional: Negative for appetite change, chills, fatigue, fever and unexpected weight change.  HENT: Negative for mouth sores, nosebleeds, sore throat and trouble swallowing.   Eyes: Positive for pressure near right eye. Negative for diplopia, exophthalmos,  decreased range of extraocular movements, or icterus.  Respiratory: Positive for dyspnea on exertion. Negative for cough, hemoptysis,  and wheezing.   Cardiovascular: Negative for chest pain and leg swelling.  Gastrointestinal: Positive for chronic diarrhea. Negative for abdominal pain, constipation, nausea and vomiting.  Genitourinary: Negative for bladder incontinence, difficulty  urinating, dysuria, frequency and hematuria.   Musculoskeletal: Negative for back pain, gait problem, neck pain and neck stiffness.  Skin: Negative for itching and rash.  Neurological: Negative for dizziness, extremity weakness, gait problem, headaches, light-headedness and seizures.  Hematological: Negative for adenopathy. Does not bruise/bleed easily.  Psychiatric/Behavioral: Negative for confusion, depression and sleep disturbance. The patient is not nervous/anxious.     PHYSICAL EXAMINATION:  Blood pressure (!) 143/92, pulse 67, temperature 97.8 F (36.6 C), temperature source Tympanic, resp. rate 18, height 5\' 4"  (1.626 m), weight 114 lb 3.2 oz (51.8 kg), SpO2 96 %.  ECOG PERFORMANCE STATUS: 1 - Symptomatic but completely ambulatory  Physical Exam  Constitutional: Oriented to person, place, and time and thin appearing female and in no distress.  HENT:  Head: Normocephalic and atraumatic.  Mouth/Throat: Oropharynx is clear and moist. No oropharyngeal exudate.  Eyes: Conjunctivae are normal. Right  eye exhibits no discharge. Left eye exhibits no discharge. No scleral icterus. Normal range of motion. No exophthalmos. No ptosis.  Neck: Normal range of motion. Neck supple.  Cardiovascular: Normal rate, regular rhythm, normal heart sounds and intact distal pulses.   Pulmonary/Chest: Effort normal and breath sounds normal. No respiratory distress. No wheezes. No rales.  Abdominal: Soft. Bowel sounds are normal. Exhibits no distension and no mass. There is no tenderness.  Musculoskeletal: Normal range of motion. Exhibits no edema.  Lymphadenopathy:    No cervical adenopathy.  Neurological: Alert and oriented to person, place, and time. Exhibits normal muscle tone. Gait normal. Coordination normal.  Skin: Skin is warm and dry. No rash noted. Not diaphoretic. No erythema. No pallor.  Psychiatric: Mood, memory and judgment normal.  Vitals reviewed.  LABORATORY DATA: Lab Results  Component  Value Date   WBC 7.5 01/14/2020   HGB 15.2 (H) 01/14/2020   HCT 46.4 (H) 01/14/2020   MCV 97.3 01/14/2020   PLT 241 01/14/2020      Chemistry      Component Value Date/Time   NA 134 (L) 01/14/2020 0953   K 3.8 01/14/2020 0953   CL 97 (L) 01/14/2020 0953   CO2 31 01/14/2020 0953   BUN 10 01/14/2020 0953   CREATININE 0.75 01/14/2020 0953      Component Value Date/Time   CALCIUM 9.8 01/14/2020 0953   ALKPHOS 80 01/14/2020 0953   AST 23 01/14/2020 0953   ALT 20 01/14/2020 0953   BILITOT 0.5 01/14/2020 0953       RADIOGRAPHIC STUDIES:  CT Chest Wo Contrast  Result Date: 01/14/2020 CLINICAL DATA:  Restaging non-small cell lung cancer. History of right upper lobe lobectomy 05/28/2019. EXAM: CT CHEST WITHOUT CONTRAST TECHNIQUE: Multidetector CT imaging of the chest was performed following the standard protocol without IV contrast. COMPARISON:  PET-CT 05/08/2019 and chest CT 04/17/2019 FINDINGS: Cardiovascular: The heart is normal in size. No pericardial effusion. Stable fusiform aneurysmal dilatation of the ascending thoracic aorta with maximum measurement of 4.1 cm. Stable age advanced atherosclerotic calcifications involving the thoracic aorta and branch vessels including three-vessel coronary artery calcifications. Mediastinum/Nodes: No mediastinal or hilar mass or lymphadenopathy. The esophagus is unremarkable. Lungs/Pleura: Surgical changes from a right upper lobe lobectomy. Moderate soft tissue thickening/scarring change along the staple line. No findings suspicious for residual or recurrent tumor but attention to this area on future scans is suggested as this is the first postoperative CT scan. Stable underlying advanced emphysematous changes and areas of pulmonary scarring. No acute overlying pulmonary process. No pulmonary nodules to suggest pulmonary metastatic disease. Upper Abdomen: No significant upper abdominal findings. Stable left renal cyst. No worrisome hepatic lesions or  adrenal gland lesions. Status post cholecystectomy with associated stable common bile duct dilatation. Significant age advanced atherosclerotic calcifications involving the upper abdominal aorta and branch vessel ostia. Musculoskeletal: No breast masses, supraclavicular or axillary adenopathy. Stable fairly extensive macro calcifications in both breasts. The bony thorax is intact.  No worrisome bone lesions. IMPRESSION: 1. Surgical changes from a right upper lobe lobectomy. Moderate soft tissue thickening/scarring change along the staple line. No findings suspicious for residual or recurrent tumor but attention to this area on future scans is suggested as this is the first postoperative CT scan. 2. No mediastinal or hilar mass or adenopathy. 3. Stable 4.1 cm ascending thoracic aortic aneurysm. Recommend annual imaging followup by CTA or MRA. This recommendation follows 2010 ACCF/AHA/AATS/ACR/ASA/SCA/SCAI/SIR/STS/SVM Guidelines for the Diagnosis and Management of Patients with Thoracic Aortic  Disease. Circulation. 2010; 121: I458-K998. Aortic aneurysm NOS (ICD10-I71.9) 4. Stable advanced emphysematous changes and pulmonary scarring. 5. Stable age advanced atherosclerotic calcifications involving the thoracic and abdominal aorta and branch vessels including three-vessel coronary artery calcifications. 6. Emphysema and aortic atherosclerosis. Aortic Atherosclerosis (ICD10-I70.0) and Emphysema (ICD10-J43.9). Electronically Signed   By: Marijo Sanes [name]   On: 01/14/2020 12:11     ASSESSMENT/PLAN:  This is a very pleasant 69 year old Caucasian female diagnosed with a stage Ia (T1b, N0, M0) non-small cell lung cancer, adenocarcinoma presented with right upper lobe nodule status post left upper lobectomy with lymph node dissection under the care of Dr. Roxan Hockey on May 28, 2019.  The patient recently had a restaging CT scan. Dr. Julien Nordmann personally and independently reviewed the scan and discussed the results  with the patient today.  The scan did not show any evidence of disease progression.  We recommend that she continue on observation with a repeat CT scan in 6 months.  I will send a prescription for nicoderm patches to her pharmacy. The patient is interested in quitting smoking.   The patient is requesting a refill of her inhaler. Dr. Julien Nordmann discussed that the refill will need to come from her PCP, who she is going to try to make an appointment with next month.   Regarding her unusual eye symptoms, since she is overdue for an annual eye exam, we recommend that she follow up with her eye doctor first. If no abnormalities are noted on exam and she continues to have persistent or worsening symptoms, she was advised to call us back and we would consider neuroimaging.   The patient was advised to call immediately if she has any concerning symptoms in the interval. The patient voices understanding of current disease status and treatment options and is in agreement with the current care plan. All questions were answered. The patient knows to call the clinic with any problems, questions or concerns. We can certainly see the patient much sooner if necessary   Orders Placed This Encounter  Procedures  . CT Chest Wo Contrast    Standing Status:   Future    Standing Expiration Date:   01/16/2021    Order Specific Question:   Preferred imaging location?    Answer:   [facility]    Order Specific Question:   Radiology Contrast Protocol - do NOT remove file path    Answer:   [email or web address]  . CBC with Differential (Cancer Center Only)    Standing Status:   Future    Standing Expiration Date:   01/16/2021  . CMP ([facility] only)    Standing Status:   Future    Standing Expiration Date:   01/16/2021     Tobe Sos Vaness Jelinski, PA-C 01/17/20  ADDENDUM: Hematology/Oncology Attending: I had a face-to-face encounter with the patient today.  I  recommended her care plan.  This is a very pleasant 69 years old white female with a stage Ia non-small cell lung cancer, adenocarcinoma status post left upper lobectomy with lymph node dissection in February 2021 under the care of Dr. Roxan Hockey. The patient is currently on observation and she is feeling fine today with no concerning complaints.  She would like to quit smoking and we will give her prescription for NicoDerm. She had repeat CT scan of the chest performed recently.  I personally and independently reviewed the scans and discussed the results with the patient today. Her scan showed no concerning findings for disease  recurrence or metastasis. I recommended for the patient to continue on observation with repeat CT scan of the chest in 6 months. The patient was advised to call immediately if she has any concerning symptoms in the interval.  Disclaimer: This note was dictated with voice recognition software. Similar sounding words can inadvertently be transcribed and may be missed upon review. Eilleen Kempf, MD 01/18/20

## 2020-01-17 ENCOUNTER — Telehealth: Payer: Self-pay | Admitting: Physician Assistant

## 2020-01-17 ENCOUNTER — Other Ambulatory Visit: Payer: Self-pay

## 2020-01-17 ENCOUNTER — Ambulatory Visit: Payer: Medicare PPO | Admitting: Physician Assistant

## 2020-01-17 ENCOUNTER — Inpatient Hospital Stay (HOSPITAL_BASED_OUTPATIENT_CLINIC_OR_DEPARTMENT_OTHER): Payer: Medicare PPO | Admitting: Physician Assistant

## 2020-01-17 VITALS — BP 143/92 | HR 67 | Temp 97.8°F | Resp 18 | Ht 64.0 in | Wt 114.2 lb

## 2020-01-17 DIAGNOSIS — F17290 Nicotine dependence, other tobacco product, uncomplicated: Secondary | ICD-10-CM | POA: Diagnosis not present

## 2020-01-17 DIAGNOSIS — K529 Noninfective gastroenteritis and colitis, unspecified: Secondary | ICD-10-CM | POA: Diagnosis not present

## 2020-01-17 DIAGNOSIS — C3490 Malignant neoplasm of unspecified part of unspecified bronchus or lung: Secondary | ICD-10-CM | POA: Diagnosis not present

## 2020-01-17 DIAGNOSIS — Z720 Tobacco use: Secondary | ICD-10-CM | POA: Diagnosis not present

## 2020-01-17 DIAGNOSIS — Z85118 Personal history of other malignant neoplasm of bronchus and lung: Secondary | ICD-10-CM | POA: Diagnosis not present

## 2020-01-17 DIAGNOSIS — Z902 Acquired absence of lung [part of]: Secondary | ICD-10-CM

## 2020-01-17 MED ORDER — NICOTINE 21 MG/24HR TD PT24: 21.0000 mg | MEDICATED_PATCH | Freq: Every day | TRANSDERMAL | 1 refills | Status: DC

## 2020-01-17 NOTE — Telephone Encounter (Signed)
Scheduled appointments per 9/23 los. Spoke to patient who is aware of appointments. Gave patient calendar print out.

## 2020-01-18 ENCOUNTER — Encounter: Payer: Self-pay | Admitting: Physician Assistant

## 2020-01-18 DIAGNOSIS — Z85118 Personal history of other malignant neoplasm of bronchus and lung: Secondary | ICD-10-CM | POA: Diagnosis not present

## 2020-01-18 DIAGNOSIS — I10 Essential (primary) hypertension: Secondary | ICD-10-CM | POA: Diagnosis not present

## 2020-01-18 DIAGNOSIS — Z9049 Acquired absence of other specified parts of digestive tract: Secondary | ICD-10-CM | POA: Diagnosis not present

## 2020-01-18 DIAGNOSIS — K219 Gastro-esophageal reflux disease without esophagitis: Secondary | ICD-10-CM | POA: Diagnosis not present

## 2020-01-18 DIAGNOSIS — J45909 Unspecified asthma, uncomplicated: Secondary | ICD-10-CM | POA: Diagnosis not present

## 2020-01-18 DIAGNOSIS — I471 Supraventricular tachycardia: Secondary | ICD-10-CM | POA: Diagnosis not present

## 2020-01-18 DIAGNOSIS — Z91041 Radiographic dye allergy status: Secondary | ICD-10-CM | POA: Diagnosis not present

## 2020-01-18 DIAGNOSIS — K259 Gastric ulcer, unspecified as acute or chronic, without hemorrhage or perforation: Secondary | ICD-10-CM | POA: Diagnosis not present

## 2020-01-18 DIAGNOSIS — E785 Hyperlipidemia, unspecified: Secondary | ICD-10-CM | POA: Diagnosis not present

## 2020-01-18 DIAGNOSIS — I472 Ventricular tachycardia: Secondary | ICD-10-CM | POA: Diagnosis not present

## 2020-01-18 DIAGNOSIS — Z87892 Personal history of anaphylaxis: Secondary | ICD-10-CM | POA: Diagnosis not present

## 2020-01-18 DIAGNOSIS — G629 Polyneuropathy, unspecified: Secondary | ICD-10-CM | POA: Diagnosis not present

## 2020-01-18 DIAGNOSIS — F17210 Nicotine dependence, cigarettes, uncomplicated: Secondary | ICD-10-CM | POA: Diagnosis not present

## 2020-01-21 DIAGNOSIS — Z1231 Encounter for screening mammogram for malignant neoplasm of breast: Secondary | ICD-10-CM | POA: Diagnosis not present

## 2020-01-22 ENCOUNTER — Encounter: Payer: Medicare PPO | Admitting: Thoracic Surgery (Cardiothoracic Vascular Surgery)

## 2020-01-22 ENCOUNTER — Ambulatory Visit: Payer: Medicare PPO | Admitting: Thoracic Surgery (Cardiothoracic Vascular Surgery)

## 2020-01-22 ENCOUNTER — Other Ambulatory Visit: Payer: Self-pay

## 2020-01-22 VITALS — BP 124/82 | HR 72 | Resp 20 | Ht 64.0 in | Wt 114.0 lb

## 2020-01-22 DIAGNOSIS — C3491 Malignant neoplasm of unspecified part of right bronchus or lung: Secondary | ICD-10-CM | POA: Diagnosis not present

## 2020-01-22 NOTE — Progress Notes (Signed)
[address]       [place] [address]             [phone]     HPI: [name] returns for a scheduled follow-up visit  [name] is a 69 year old woman with a history of tobacco abuse, COPD, migraines, hypertension, hyperlipidemia, endometriosis, anxiety, thoracic aortic atherosclerosis, ascending thoracic aneurysm, and lung cancer.  She had a robotic right upper lobectomy for a T1N0 stage Ia adenocarcinoma on 05/28/2019.  Postoperatively she had a prolonged air leak, esophagitis, gastric ulcer, and wide-complex tachycardia.  She had a lot of neuropathic pain after surgery.  For a while she was on fentanyl patch but we were able to get her off of that.  We finally treated her with gabapentin.  We worked up to a dose of 600 mg 3 times daily.  That had a dramatic improvement in her pain.  She saw [name] last week.  She had a CT scan which showed no evidence of recurrence.  Her pain has been very well controlled.  She says she missed a couple doses of the gabapentin on Sunday when she was babysitting her grandkids and had a lot of pain that day.  Otherwise has been well controlled.  She is not been having any respiratory issues.  She has been smoking.  She does have a prescription for NicoDerm patches.  Past Medical History:  Diagnosis Date  . Active smoker   . Anxiety   . Cervical dysplasia   . Complication of anesthesia    "I'm usually hard to wake up"  . Dyslipidemia   . Endometriosis   . History of nuclear stress test 07/07/2010   dipyridamole; low risk, post-stress EF 65%  . Hypertension   . Migraine   . Osteopenia    08/2006 and 08/2008 Dexa Scans w [name]   . Pneumonia    hx pneunomia ~2016  . PONV (postoperative nausea and vomiting)   . Sleep apnea    does not wear her CPAP because "it gave me respiratory issues (bronchities and pneumonia)"    Current Outpatient Medications  Medication Sig Dispense Refill  . amLODipine  (NORVASC) 2.5 MG tablet Take 1 tablet (2.5 mg total) by mouth daily. 90 tablet 1  . aspirin EC 81 MG tablet Take 81 mg by mouth daily.    . cholestyramine (QUESTRAN) 4 GM/DOSE powder Take 4 g by mouth daily.    Marland Kitchen denosumab (PROLIA) 60 MG/ML SOLN injection Inject 60 mg into the skin every 6 (six) months. Administer in upper arm, thigh, or abdomen    . gabapentin (NEURONTIN) 300 MG capsule TAKE 2 CAPSULES(600 MG) BY MOUTH THREE TIMES DAILY 180 capsule 2  . metoprolol succinate (TOPROL-XL) 50 MG 24 hr tablet Take 25 mg by mouth daily. Take with or immediately following a meal.    . montelukast (SINGULAIR) 10 MG tablet Take 10 mg by mouth at bedtime.   0  . nicotine (NICODERM CQ - DOSED IN MG/24 HOURS) 21 mg/24hr patch Place 1 patch (21 mg total) onto the skin daily. 28 patch 1  . OVER THE COUNTER MEDICATION "Our Choice probiotic"    . pantoprazole (PROTONIX) 40 MG tablet Take 1 tablet (40 mg total) by mouth 2 (two) times daily. 60 tablet 3  . rosuvastatin (CRESTOR) 5 MG tablet Take 1 tablet (5 mg total) by mouth daily. 90 tablet 3  . telmisartan-hydrochlorothiazide (MICARDIS HCT) 80-12.5 MG tablet Take 1 tablet by  mouth daily. 90 tablet 3  . VENTOLIN HFA 108 (90 BASE) MCG/ACT inhaler Inhale 2 puffs into the lungs every 6 (six) hours as needed for wheezing or shortness of breath.      No current facility-administered medications for this visit.    Physical Exam BP 124/82   Pulse 72   Resp 20   Ht 5\' 4"  (1.626 m)   Wt 114 lb (51.7 kg)   SpO2 91% Comment: RA  BMI 19.55 kg/m  69 year old woman in no acute distress Alert and oriented x3 with no focal deficits No cervical or supraclavicular adenopathy Lungs diminished breath sounds bilaterally especially right base, no wheezing Cardiac regular rate and rhythm  Diagnostic Tests: CT CHEST WITHOUT CONTRAST  TECHNIQUE: Multidetector CT imaging of the chest was performed following the standard protocol without IV contrast.  COMPARISON:   PET-CT 05/08/2019 and chest CT 04/17/2019  FINDINGS: Cardiovascular: The heart is normal in size. No pericardial effusion. Stable fusiform aneurysmal dilatation of the ascending thoracic aorta with maximum measurement of 4.1 cm. Stable age advanced atherosclerotic calcifications involving the thoracic aorta and branch vessels including three-vessel coronary artery calcifications.  Mediastinum/Nodes: No mediastinal or hilar mass or lymphadenopathy. The esophagus is unremarkable.  Lungs/Pleura: Surgical changes from a right upper lobe lobectomy. Moderate soft tissue thickening/scarring change along the staple line. No findings suspicious for residual or recurrent tumor but attention to this area on future scans is suggested as this is the first postoperative CT scan.  Stable underlying advanced emphysematous changes and areas of pulmonary scarring. No acute overlying pulmonary process. No pulmonary nodules to suggest pulmonary metastatic disease.  Upper Abdomen: No significant upper abdominal findings. Stable left renal cyst. No worrisome hepatic lesions or adrenal gland lesions. Status post cholecystectomy with associated stable common bile duct dilatation. Significant age advanced atherosclerotic calcifications involving the upper abdominal aorta and branch vessel ostia.  Musculoskeletal: No breast masses, supraclavicular or axillary adenopathy. Stable fairly extensive macro calcifications in both breasts.  The bony thorax is intact.  No worrisome bone lesions.  IMPRESSION: 1. Surgical changes from a right upper lobe lobectomy. Moderate soft tissue thickening/scarring change along the staple line. No findings suspicious for residual or recurrent tumor but attention to this area on future scans is suggested as this is the first postoperative CT scan. 2. No mediastinal or hilar mass or adenopathy. 3. Stable 4.1 cm ascending thoracic aortic aneurysm. Recommend annual  imaging followup by CTA or MRA. This recommendation follows 2010 ACCF/AHA/AATS/ACR/ASA/SCA/SCAI/SIR/STS/SVM Guidelines for the Diagnosis and Management of Patients with Thoracic Aortic Disease. Circulation. 2010; 121: Q469-G295. Aortic aneurysm NOS (ICD10-I71.9) 4. Stable advanced emphysematous changes and pulmonary scarring. 5. Stable age advanced atherosclerotic calcifications involving the thoracic and abdominal aorta and branch vessels including three-vessel coronary artery calcifications. 6. Emphysema and aortic atherosclerosis.  Aortic Atherosclerosis (ICD10-I70.0) and Emphysema (ICD10-J43.9).   Electronically Signed   By: Marijo Sanes [name]   On: 01/14/2020 12:11 I personally reviewed the CT images and concur with the findings noted above  Impression: [name] is a 69 year old woman with a history of tobacco abuse, COPD, migraines, hypertension, hyperlipidemia, endometriosis, anxiety, and right upper lobectomy for stage Ia adenocarcinoma.  Lung cancer-stage Ia adenocarcinoma, status post lobectomy.  CT shows no evidence of recurrent disease.  Needs continued follow-up per guidelines.  Postoperative neuropathic pain-controlled with gabapentin 600 mg 3 times daily.  She missed couple of doses of that this weekend and had a lot of pain.  Think we need to leave her on  that for now.  Hopefully she can be weaned off of that eventually.  Ascending aortic aneurysm/thoracic aortic atherosclerosis-focal 1 cm.  Stable.  No indication for intervention at this time.  Blood pressure control is mainstay of treatment.  Tobacco cessation is also vital.  Tobacco abuse-importance of tobacco cessation from the standpoint of pulmonary function, lung cancer, atherosclerotic cardiovascular disease, aortic aneurysm, and stroke risk was emphasized.  She does have a prescription for NicoDerm patches and is going to try to quit.  Plan: Quit smoking And continue gabapentin 600 mg 3 times  daily Follow-up with Dr. Julien Nordmann in 6 months Return in 6 months after CT  Melrose Nakayama, MD [facility] [phone]

## 2020-02-05 ENCOUNTER — Other Ambulatory Visit: Payer: Self-pay | Admitting: Thoracic Surgery (Cardiothoracic Vascular Surgery)

## 2020-03-18 DIAGNOSIS — E559 Vitamin D deficiency, unspecified: Secondary | ICD-10-CM | POA: Diagnosis not present

## 2020-03-18 DIAGNOSIS — E7800 Pure hypercholesterolemia, unspecified: Secondary | ICD-10-CM | POA: Diagnosis not present

## 2020-03-18 DIAGNOSIS — I10 Essential (primary) hypertension: Secondary | ICD-10-CM | POA: Diagnosis not present

## 2020-03-18 DIAGNOSIS — E039 Hypothyroidism, unspecified: Secondary | ICD-10-CM | POA: Diagnosis not present

## 2020-04-07 DIAGNOSIS — C3412 Malignant neoplasm of upper lobe, left bronchus or lung: Secondary | ICD-10-CM | POA: Diagnosis not present

## 2020-04-07 DIAGNOSIS — I10 Essential (primary) hypertension: Secondary | ICD-10-CM | POA: Diagnosis not present

## 2020-04-07 DIAGNOSIS — Z0000 Encounter for general adult medical examination without abnormal findings: Secondary | ICD-10-CM | POA: Diagnosis not present

## 2020-04-07 DIAGNOSIS — I700 Atherosclerosis of aorta: Secondary | ICD-10-CM | POA: Diagnosis not present

## 2020-04-07 DIAGNOSIS — E559 Vitamin D deficiency, unspecified: Secondary | ICD-10-CM | POA: Diagnosis not present

## 2020-04-07 DIAGNOSIS — F419 Anxiety disorder, unspecified: Secondary | ICD-10-CM | POA: Diagnosis not present

## 2020-04-07 DIAGNOSIS — J449 Chronic obstructive pulmonary disease, unspecified: Secondary | ICD-10-CM | POA: Diagnosis not present

## 2020-04-13 ENCOUNTER — Other Ambulatory Visit: Payer: Self-pay | Admitting: Internal Medicine

## 2020-04-14 ENCOUNTER — Telehealth: Payer: Self-pay | Admitting: Internal Medicine

## 2020-04-14 MED ORDER — METOPROLOL SUCCINATE ER 25 MG PO TB24: 25.0000 mg | ORAL_TABLET | Freq: Every day | ORAL | 3 refills | Status: DC

## 2020-04-14 NOTE — Telephone Encounter (Signed)
   *  STAT* If patient is at the pharmacy, call can be transferred to refill team.   1. Which medications need to be refilled? (please list name of each medication and dose if known)   metoprolol succinate (TOPROL-XL) 50 MG 24 hr tablet    2. Which pharmacy/location (including street and city if local pharmacy) is medication to be sent to? [facility] [place], [address]  3. Do they need a 30 day or 90 day supply? 90 days    Pt asked if she can get 25 mg so she doesn't have to cur the meds in half

## 2020-05-12 ENCOUNTER — Other Ambulatory Visit: Payer: Self-pay | Admitting: Thoracic Surgery (Cardiothoracic Vascular Surgery)

## 2020-05-13 ENCOUNTER — Telehealth: Payer: Self-pay

## 2020-05-13 NOTE — Telephone Encounter (Signed)
-----   Message from Melrose Nakayama, MD sent at 05/13/2020  1:45 PM EST ----- Regarding: RE: Gabapentin refill thanks ----- Message ----- From: Donnella Sham, RN Sent: 05/13/2020   1:13 PM EST To: Melrose Nakayama, MD Subject: Gabapentin refill                              Hey,  Just FYI, I did refill her Gabapentin. You saw her back in September 2021 and for her to continue medication.  Thanks,  Caryl Pina

## 2020-05-19 ENCOUNTER — Other Ambulatory Visit: Payer: Self-pay

## 2020-05-19 ENCOUNTER — Encounter: Payer: Self-pay | Admitting: Pulmonary Disease

## 2020-05-19 ENCOUNTER — Ambulatory Visit (INDEPENDENT_AMBULATORY_CARE_PROVIDER_SITE_OTHER): Payer: Medicare PPO | Admitting: Pulmonary Disease

## 2020-05-19 VITALS — BP 116/74 | HR 81 | Ht 63.78 in | Wt 114.0 lb

## 2020-05-19 DIAGNOSIS — Z902 Acquired absence of lung [part of]: Secondary | ICD-10-CM | POA: Diagnosis not present

## 2020-05-19 DIAGNOSIS — Z720 Tobacco use: Secondary | ICD-10-CM | POA: Diagnosis not present

## 2020-05-19 DIAGNOSIS — J431 Panlobular emphysema: Secondary | ICD-10-CM | POA: Diagnosis not present

## 2020-05-19 MED ORDER — SPIRIVA RESPIMAT 2.5 MCG/ACT IN AERS
2.0000 | INHALATION_SPRAY | Freq: Every day | RESPIRATORY_TRACT | 0 refills | Status: DC
Start: 2020-05-19 — End: 2021-12-22

## 2020-05-19 MED ORDER — SPIRIVA RESPIMAT 2.5 MCG/ACT IN AERS: 2.0000 | INHALATION_SPRAY | Freq: Every day | RESPIRATORY_TRACT | 6 refills | Status: DC

## 2020-05-19 NOTE — Progress Notes (Signed)
Synopsis: Referred in January 2022 for asthma  Subjective:   PATIENT ID: [name] GENDER: female DOB: [birth year], MRN: [id number]   HPI  Chief Complaint  Patient presents with  . Consult    [name] is a 70 year old woman, former smoker with asthma, emphysema and lung adenocarcinoma s/p robotic right upper lobectomy 05/28/19 who is referred to pulmonary clinic for shortness of breath and wheezing.   She had a prolonged post-op recovery from her lobectomy in 05/2019 due to prolonged air leak, esophagitis, gastric ulcer and wide-complex tachycardia. Since her surgery she has had increasing shortness of breath with exertion and over recent months she reports an increase in wheezing. She is using albuterol inhaler 4-6 times per day. She has never been on a maintenance inhaler in the past. She rarely used albuterol prior to her surgery. She does have a cough that is intermittently productive with sputum.   She has returned to smoking over recent months due to recent stressors in her personal life.   Past Medical History:  Diagnosis Date  . Active smoker   . Anxiety   . Cervical dysplasia   . Complication of anesthesia    "I'm usually hard to wake up"  . Dyslipidemia   . Endometriosis   . History of nuclear stress test 07/07/2010   dipyridamole; low risk, post-stress EF 65%  . Hypertension   . Migraine   . Osteopenia    08/2006 and 08/2008 Dexa Scans w Isaiah Blakes   . Pneumonia    hx pneunomia ~2016  . PONV (postoperative nausea and vomiting)   . Sleep apnea    does not wear her CPAP because "it gave me respiratory issues (bronchities and pneumonia)"     Family History  Problem Relation Age of Onset  . Hypertension Mother   . Cancer Mother        LIVER AND LUNG  . Hypertension Brother   . Heart disease Brother   . Hypertension Maternal Grandfather   . Heart disease Maternal Grandfather   . Heart attack Maternal Grandfather      Social History    Socioeconomic History  . Marital status: Divorced    Spouse name: Not on file  . Number of children: Not on file  . Years of education: Not on file  . Highest education level: Not on file  Occupational History  . Not on file  Tobacco Use  . Smoking status: Former Smoker    Packs/day: 1.00    Years: 42.00    Pack years: 42.00    Types: Cigarettes    Quit date: 05/18/2019    Years since quitting: 1.0  . Smokeless tobacco: Never Used  Vaping Use  . Vaping Use: Never used  Substance and Sexual Activity  . Alcohol use: Yes    Alcohol/week: 10.0 standard drinks    Types: 10 Glasses of wine per week    Comment: ~10/week  . Drug use: No  . Sexual activity: Not Currently    Birth control/protection: Surgical    Comment: INTERCOUSRE AGE 4,SEXUAL PARTNERS MORE THAN 5  Other Topics Concern  . Not on file  Social History Narrative  . Not on file   Social Determinants of Health   Financial Resource Strain: Not on file  Food Insecurity: Not on file  Transportation Needs: Not on file  Physical Activity: Not on file  Stress: Not on file  Social Connections: Not on file  Intimate Partner Violence:  Not on file     Allergies  Allergen Reactions  . Iodinated Diagnostic Agents Itching    itching but no hives just after iv contrast injection w/ 13 hr prep, future contrast not advised unless absolutely necessary, 13 hr prep would still be advised.//a.calhoun  . Ioxaglate Itching    itching but no hives just after iv contrast injection w/ 13 hr prep, future contrast not advised unless absolutely necessary, 13 hr prep would still be advised.//a.calhoun  . Omnipaque [Iohexol] Anaphylaxis  . Buprenorphine Hcl Nausea And Vomiting  . Codeine Nausea And Vomiting  . Erythromycin   . Morphine And Related Nausea And Vomiting     Outpatient Medications Prior to Visit  Medication Sig Dispense Refill  . amLODipine (NORVASC) 2.5 MG tablet Take 1 tablet (2.5 mg total) by mouth daily. 90  tablet 1  . aspirin EC 81 MG tablet Take 81 mg by mouth daily.    . cholestyramine (QUESTRAN) 4 GM/DOSE powder Take 4 g by mouth daily.    Marland Kitchen denosumab (PROLIA) 60 MG/ML SOLN injection Inject 60 mg into the skin every 6 (six) months. Administer in upper arm, thigh, or abdomen    . gabapentin (NEURONTIN) 300 MG capsule TAKE 2 CAPSULES(600 MG) BY MOUTH THREE TIMES DAILY 180 capsule 2  . metoprolol succinate (TOPROL-XL) 25 MG 24 hr tablet Take 1 tablet (25 mg total) by mouth daily. Take with or immediately following a meal. 90 tablet 3  . montelukast (SINGULAIR) 10 MG tablet Take 10 mg by mouth at bedtime.   0  . nicotine (NICODERM CQ - DOSED IN MG/24 HOURS) 21 mg/24hr patch Place 1 patch (21 mg total) onto the skin daily. 28 patch 1  . OVER THE COUNTER MEDICATION "Our Choice probiotic"    . pantoprazole (PROTONIX) 40 MG tablet Take 1 tablet (40 mg total) by mouth 2 (two) times daily. 60 tablet 3  . rosuvastatin (CRESTOR) 5 MG tablet Take 1 tablet (5 mg total) by mouth daily. 90 tablet 3  . telmisartan-hydrochlorothiazide (MICARDIS HCT) 80-12.5 MG tablet Take 1 tablet by mouth daily. 90 tablet 3  . VENTOLIN HFA 108 (90 BASE) MCG/ACT inhaler Inhale 2 puffs into the lungs every 6 (six) hours as needed for wheezing or shortness of breath.      No facility-administered medications prior to visit.    Review of Systems  Constitutional: Negative for chills, diaphoresis, fever, malaise/fatigue and weight loss.  HENT: Negative for congestion, sinus pain and sore throat.   Eyes: Negative.   Respiratory: Positive for cough, sputum production, shortness of breath and wheezing. Negative for hemoptysis.   Cardiovascular: Negative for chest pain, palpitations, orthopnea, claudication, leg swelling and PND.  Gastrointestinal: Negative for abdominal pain, diarrhea, heartburn, nausea and vomiting.  Genitourinary: Negative.   Musculoskeletal: Negative.   Skin: Negative for rash.  Neurological: Negative.    Endo/Heme/Allergies: Negative.   Psychiatric/Behavioral: Negative.     Objective:   Vitals:   05/19/20 1538  BP: 116/74  Pulse: 81  SpO2: 90%     Physical Exam Constitutional:      General: She is not in acute distress.    Appearance: She is not ill-appearing.  HENT:     Head: Normocephalic and atraumatic.  Eyes:     General: No scleral icterus.    Conjunctiva/sclera: Conjunctivae normal.     Pupils: Pupils are equal, round, and reactive to light.  Cardiovascular:     Rate and Rhythm: Normal rate and regular rhythm.  Pulses: Normal pulses.     Heart sounds: Normal heart sounds. No murmur heard.   Pulmonary:     Effort: Pulmonary effort is normal.     Breath sounds: No wheezing, rhonchi or rales.  Abdominal:     General: Bowel sounds are normal.     Palpations: Abdomen is soft.  Musculoskeletal:     Right lower leg: No edema.     Left lower leg: No edema.  Lymphadenopathy:     Cervical: No cervical adenopathy.  Skin:    General: Skin is warm and dry.  Neurological:     General: No focal deficit present.     Mental Status: She is alert.  Psychiatric:        Mood and Affect: Mood normal.        Behavior: Behavior normal.        Thought Content: Thought content normal.        Judgment: Judgment normal.    CBC    Component Value Date/Time   WBC 7.5 01/14/2020 0953   WBC 10.4 06/04/2019 0306   RBC 4.77 01/14/2020 0953   HGB 15.2 (H) 01/14/2020 0953   HGB 15.6 10/20/2015 1341   HCT 46.4 (H) 01/14/2020 0953   HCT 46.4 10/20/2015 1341   PLT 241 01/14/2020 0953   PLT 205 10/20/2015 1341   MCV 97.3 01/14/2020 0953   MCV 98.9 10/20/2015 1341   MCH 31.9 01/14/2020 0953   MCHC 32.8 01/14/2020 0953   RDW 14.3 01/14/2020 0953   RDW 13.1 10/20/2015 1341   LYMPHSABS 2.3 01/14/2020 0953   LYMPHSABS 2.0 10/20/2015 1341   MONOABS 0.6 01/14/2020 0953   MONOABS 0.3 10/20/2015 1341   EOSABS 0.2 01/14/2020 0953   EOSABS 0.1 10/20/2015 1341   BASOSABS 0.1  01/14/2020 0953   BASOSABS 0.1 10/20/2015 1341     Chest imaging: 1. Surgical changes from a right upper lobe lobectomy. Moderate soft tissue thickening/scarring change along the staple line. No findings suspicious for residual or recurrent tumor but attention to this area on future scans is suggested as this is the first postoperative CT scan. 2. No mediastinal or hilar mass or adenopathy. 3. Stable 4.1 cm ascending thoracic aortic aneurysm.  4. Stable advanced emphysematous changes and pulmonary scarring. 5. Stable age advanced atherosclerotic calcifications involving the thoracic and abdominal aorta and branch vessels including three-vessel coronary artery calcifications. 6. Emphysema and aortic atherosclerosis.  PFT: No flowsheet data found.  Echo: 06/07/19 1. Left ventricular ejection fraction, by estimation, is 55 to 60%. The  left ventricle has normal function. The left ventrical has no regional  wall motion abnormalities. Left ventricular diastolic parameters are  consistent with Grade I diastolic  dysfunction (impaired relaxation).  2. Right ventricular systolic function is normal. The right ventricular  size is normal.  3. The mitral valve is normal in structure and function. no evidence of  mitral valve regurgitation. No evidence of mitral stenosis.  4. The aortic valve is normal in structure and function. Aortic valve  regurgitation is not visualized. No aortic stenosis is present.  5. Aortic dilatation noted. There is moderate dilatation of the ascending  aorta measuring 39 mm.   Assessment & Plan:   Panlobular emphysema (HCC)  S/P lobectomy of lung  Tobacco use  Discussion: [name] is a 70 year old woman, former smoker with asthma, emphysema and lung adenocarcinoma s/p robotic right upper lobectomy 05/28/19 who is referred to pulmonary clinic for shortness of breath and wheezing.  Based on her increased albuterol use she would benefit from a  maintenance inhaler. She is to start spiriva 2.52mcg 1 puff daily along with as needed albuterol use. We discussed that smoking cessation would also help alleviate her reactive airways symptoms. Encouraged her to use nicotine lozenges or gum.   Follow up in 4 months  Freda Jackson, MD [facility] Office: [phone]   Current Outpatient Medications:  .  amLODipine (NORVASC) 2.5 MG tablet, Take 1 tablet (2.5 mg total) by mouth daily., Disp: 90 tablet, Rfl: 1 .  aspirin EC 81 MG tablet, Take 81 mg by mouth daily., Disp: , Rfl:  .  cholestyramine (QUESTRAN) 4 GM/DOSE powder, Take 4 g by mouth daily., Disp: , Rfl:  .  denosumab (PROLIA) 60 MG/ML SOLN injection, Inject 60 mg into the skin every 6 (six) months. Administer in upper arm, thigh, or abdomen, Disp: , Rfl:  .  gabapentin (NEURONTIN) 300 MG capsule, TAKE 2 CAPSULES(600 MG) BY MOUTH THREE TIMES DAILY, Disp: 180 capsule, Rfl: 2 .  metoprolol succinate (TOPROL-XL) 25 MG 24 hr tablet, Take 1 tablet (25 mg total) by mouth daily. Take with or immediately following a meal., Disp: 90 tablet, Rfl: 3 .  montelukast (SINGULAIR) 10 MG tablet, Take 10 mg by mouth at bedtime. , Disp: , Rfl: 0 .  nicotine (NICODERM CQ - DOSED IN MG/24 HOURS) 21 mg/24hr patch, Place 1 patch (21 mg total) onto the skin daily., Disp: 28 patch, Rfl: 1 .  OVER THE COUNTER MEDICATION, "Our Choice probiotic", Disp: , Rfl:  .  pantoprazole (PROTONIX) 40 MG tablet, Take 1 tablet (40 mg total) by mouth 2 (two) times daily., Disp: 60 tablet, Rfl: 3 .  rosuvastatin (CRESTOR) 5 MG tablet, Take 1 tablet (5 mg total) by mouth daily., Disp: 90 tablet, Rfl: 3 .  telmisartan-hydrochlorothiazide (MICARDIS HCT) 80-12.5 MG tablet, Take 1 tablet by mouth daily., Disp: 90 tablet, Rfl: 3 .  Tiotropium Bromide Monohydrate (SPIRIVA RESPIMAT) 2.5 MCG/ACT AERS, Inhale 2 puffs into the lungs daily., Disp: 4 g, Rfl: 6 .  Tiotropium Bromide Monohydrate (SPIRIVA RESPIMAT) 2.5  MCG/ACT AERS, Inhale 2 puffs into the lungs daily., Disp: 4 g, Rfl: 0 .  VENTOLIN HFA 108 (90 BASE) MCG/ACT inhaler, Inhale 2 puffs into the lungs every 6 (six) hours as needed for wheezing or shortness of breath. , Disp: , Rfl:

## 2020-05-19 NOTE — Patient Instructions (Addendum)
Start spiriva respimat 2.85mcg 1 puff daily  Continue as needed albuterol use 1-2 puffs every 4-6 hours as needed for cough, wheezing, chest tightness or shortness of breath.   Please stop smoking! You are strong enough to do it! - continue with as needed nicotine gum or lozenges

## 2020-06-12 DIAGNOSIS — M810 Age-related osteoporosis without current pathological fracture: Secondary | ICD-10-CM | POA: Diagnosis not present

## 2020-06-18 DIAGNOSIS — H5202 Hypermetropia, left eye: Secondary | ICD-10-CM | POA: Diagnosis not present

## 2020-06-18 DIAGNOSIS — H2513 Age-related nuclear cataract, bilateral: Secondary | ICD-10-CM | POA: Diagnosis not present

## 2020-06-18 DIAGNOSIS — H5211 Myopia, right eye: Secondary | ICD-10-CM | POA: Diagnosis not present

## 2020-06-18 DIAGNOSIS — H353130 Nonexudative age-related macular degeneration, bilateral, stage unspecified: Secondary | ICD-10-CM | POA: Diagnosis not present

## 2020-06-18 DIAGNOSIS — H52223 Regular astigmatism, bilateral: Secondary | ICD-10-CM | POA: Diagnosis not present

## 2020-06-24 ENCOUNTER — Telehealth: Payer: Self-pay | Admitting: Internal Medicine

## 2020-06-24 NOTE — Telephone Encounter (Signed)
Contacted patient about rescheduled appointment per provider on call scheduled. Patient is aware.

## 2020-07-10 ENCOUNTER — Other Ambulatory Visit: Payer: Self-pay | Admitting: Internal Medicine

## 2020-07-14 ENCOUNTER — Other Ambulatory Visit: Payer: Medicare PPO

## 2020-07-14 ENCOUNTER — Ambulatory Visit: Payer: Medicare PPO | Admitting: Internal Medicine

## 2020-07-16 ENCOUNTER — Inpatient Hospital Stay: Payer: Medicare PPO

## 2020-07-17 ENCOUNTER — Other Ambulatory Visit: Payer: Self-pay

## 2020-07-17 ENCOUNTER — Inpatient Hospital Stay: Payer: Medicare PPO | Attending: Internal Medicine

## 2020-07-17 ENCOUNTER — Ambulatory Visit (HOSPITAL_COMMUNITY): Payer: Medicare PPO

## 2020-07-17 ENCOUNTER — Ambulatory Visit (HOSPITAL_COMMUNITY)
Admission: RE | Admit: 2020-07-17 | Discharge: 2020-07-17 | Disposition: A | Discharge status: Home / Self Care | Payer: Medicare PPO | Source: Ambulatory Visit | Attending: Physician Assistant | Admitting: Physician Assistant

## 2020-07-17 DIAGNOSIS — C3411 Malignant neoplasm of upper lobe, right bronchus or lung: Secondary | ICD-10-CM | POA: Insufficient documentation

## 2020-07-17 DIAGNOSIS — C3490 Malignant neoplasm of unspecified part of unspecified bronchus or lung: Secondary | ICD-10-CM

## 2020-07-17 DIAGNOSIS — I712 Thoracic aortic aneurysm, without rupture: Secondary | ICD-10-CM | POA: Diagnosis not present

## 2020-07-17 DIAGNOSIS — S2241XA Multiple fractures of ribs, right side, initial encounter for closed fracture: Secondary | ICD-10-CM | POA: Diagnosis not present

## 2020-07-17 DIAGNOSIS — J432 Centrilobular emphysema: Secondary | ICD-10-CM | POA: Diagnosis not present

## 2020-07-17 LAB — CBC WITH DIFFERENTIAL (CANCER CENTER ONLY)
Abs Immature Granulocytes: 0.02 10*3/uL (ref 0.00–0.07)
Basophils Absolute: 0 10*3/uL (ref 0.0–0.1)
Basophils Relative: 1 %
Eosinophils Absolute: 0.1 10*3/uL (ref 0.0–0.5)
Eosinophils Relative: 1 %
HCT: 48.7 % — ABNORMAL HIGH (ref 36.0–46.0)
Hemoglobin: 16.6 g/dL — ABNORMAL HIGH (ref 12.0–15.0)
Immature Granulocytes: 0 %
Lymphocytes Relative: 32 %
Lymphs Abs: 2.2 10*3/uL (ref 0.7–4.0)
MCH: 33.9 pg (ref 26.0–34.0)
MCHC: 34.1 g/dL (ref 30.0–36.0)
MCV: 99.4 fL (ref 80.0–100.0)
Monocytes Absolute: 0.5 10*3/uL (ref 0.1–1.0)
Monocytes Relative: 8 %
Neutro Abs: 4 10*3/uL (ref 1.7–7.7)
Neutrophils Relative %: 58 %
Platelet Count: 206 10*3/uL (ref 150–400)
RBC: 4.9 MIL/uL (ref 3.87–5.11)
RDW: 13.1 % (ref 11.5–15.5)
WBC Count: 6.8 10*3/uL (ref 4.0–10.5)
nRBC: 0 % (ref 0.0–0.2)

## 2020-07-17 LAB — CMP (CANCER CENTER ONLY)
ALT: 25 U/L (ref 0–44)
AST: 28 U/L (ref 15–41)
Albumin: 4.3 g/dL (ref 3.5–5.0)
Alkaline Phosphatase: 89 U/L (ref 38–126)
Anion gap: 14 (ref 5–15)
BUN: 8 mg/dL (ref 8–23)
CO2: 31 mmol/L (ref 22–32)
Calcium: 9.7 mg/dL (ref 8.9–10.3)
Chloride: 92 mmol/L — ABNORMAL LOW (ref 98–111)
Creatinine: 0.73 mg/dL (ref 0.44–1.00)
GFR, Estimated: >60 mL/min (ref >60)
Glucose, Bld: 94 mg/dL (ref 70–99)
Potassium: 3.8 mmol/L (ref 3.5–5.1)
Sodium: 137 mmol/L (ref 135–145)
Total Bilirubin: 0.6 mg/dL (ref 0.3–1.2)
Total Protein: 8 g/dL (ref 6.5–8.1)

## 2020-07-18 ENCOUNTER — Ambulatory Visit: Payer: Medicare PPO | Admitting: Internal Medicine

## 2020-07-18 ENCOUNTER — Encounter: Payer: Self-pay | Admitting: Internal Medicine

## 2020-07-18 VITALS — BP 128/90 | HR 82 | Ht 64.0 in | Wt 120.0 lb

## 2020-07-18 DIAGNOSIS — I10 Essential (primary) hypertension: Secondary | ICD-10-CM

## 2020-07-18 DIAGNOSIS — Z902 Acquired absence of lung [part of]: Secondary | ICD-10-CM

## 2020-07-18 DIAGNOSIS — R002 Palpitations: Secondary | ICD-10-CM | POA: Diagnosis not present

## 2020-07-18 NOTE — Progress Notes (Signed)
OFFICE NOTE  Chief Complaint:  No complaints  Primary Care Physician: Melanie Gravel, MD  HPI:  [name] is a 70 year old female I have been following for anxiety as well as difficult to control hypertension. Blood pressure has been much better controlled on her current regimen of Micardis/HCTZ as well as the metoprolol. She is also on Crestor for dyslipidemia and has had a well-controlled lipid profile. Recently, she has been concerned about fast intestinal transits, diarrhea, and/or stomach gurgling. She takes cholestyramine for this and was started on Dexilant for reflux-type symptoms. Although this has been helpful, she continues to have some of those complaints, but no other active cardiac complaints. Unfortunately, she continues to smoke. We discussed smoking cessation today. However, she is not quite ready to quit due to stress in her job. Fortunately her boss quit in the past year and her stress is improved significantly. She tells her she has 2 more years left and then she can retire with attention. Overall she thinks she is doing very well. The only other new issue is that she broke a small bone in the left foot but that has healed up very quickly.  [name] returns today for followup. She reports having some upper midepigastric pain but has not been taking her PPI regularly. Just reports not taking her cholesterol medication regularly. She doesn't be taken her blood pressure medicines and her blood pressure is well-controlled today.  I saw [name] back today in follow-up. She tells me that she is only a few days away from retirement. Unfortunately there'll be a 3-4 week delay in insurance coverage before she can start with her new state insurance. She was told that she could get insurance coverage from [place] however the cost would be more than $800. From a medical standpoint she seems to be doing well and is interested in quitting cigarettes. She got a prescription for Chantix  but has not yet started. Blood pressure is well-controlled. She has had good cholesterol control on Crestor.  03/26/2016  [name] returns today for follow-up. Blood pressure appears to be well-controlled. She denies any chest pain, but has had some worsening fatigue and dyspnea. She says she gets short of breath walking up stairs and has had recent decreased exercise tolerance.   03/25/2017  [name] was seen today in follow-up.  She recently won a trip to [place] and unfortunately fell and fractured 3 ribs.  She seems to be recovering from that.  She is not taking pain medicine regularly.  She has had some nausea and shortness of breath which is consistent.  She had a stress test last year which was negative for ischemia and showed normal LV function.  She denies any worsening or new symptoms associated with that.  04/06/2018  [name] is seen today in follow-up.  Overall she is doing well.  She denies any chest pain or worsening shortness of breath.  Her EKG, personally reviewed today shows some anteroseptal T wave inversions which are slightly worse than previously.  Despite this she is completely asymptomatic.  She is trying to work on smoking cessation.  She says she still struggles with sleep at night.  Of note her weight has been declining.  She is borderline underweight today.  Labs from June 2019 showed total cholesterol 140, HDL 56, LDL 66 and triglycerides 92.  Creatinine was normal.  04/09/2019  [name] returns today for follow-up.  Her weight is down a little more.  She continues  to smoke and recently has reported increasing productive cough and phlegm.  I was reviewing old records and indicated that she had a CT scan in 2012 of the chest which showed emphysematous disease and some pulmonary nodules.  I do not see any follow-up of that in our system nor even a chest x-ray.  Is not clear whether she has had follow-up with this with her primary care provider.  She continues to  smoke and her mother actually died of lung cancer.  She also told me that her brother recently died.  He has been in the hospital couple times with coronary disease and had a bypass.  Blood pressure is actually low today.  Recently she was seen by Doreene Adas, PA-C, who restarted Toprol.  Apparently she was not taking this medicine but restarted it however is also on amlodipine in combination telmisartan/HCTZ.  Blood pressure today is accordingly low.  09/10/2019  [name] is seen today in follow-up.  She had an extensive hospitalization after we identified a primary lung tumor.  She had wedge resection of this with a protracted hospitalization including an episode of ventricular tachycardia which was thought to be related to severe electrolyte derangement.  An echo was performed which showed normal systolic function.  She declined heart catheterization and ultimately underwent outpatient stress testing which was negative for ischemia.  She said she has been fairly slow to recover and continues to have some neuropathic symptoms as well as joint aches and pains.  Recently her blood pressure started climbing higher.  She was taken off of her telmisartan/HCTZ which I advised restarting.  Blood pressure today is excellent at 112/72.  Recent lipids in February showed total cholesterol 93, triglycerides 120, HDL 25 and LDL 44 which seemed to be more than adequately controlled on rosuvastatin 10 mg daily.  Her beta-blocker was stopped during her hospitalization apparently.  07/18/2020  [name] is seen today for follow-up.  Overall she seems to be doing well.  She does get some infrequent palpitations.  I had switched of her medicines last time because needed to add back her beta-blocker.  She might need just a little more and I advised her that she could take an extra half pill if necessary.  Blood pressure looks excellent today.  She gets some occasional sighing which may happen several times a week.  This is  primarily pulmonary likely represents a degree of atelectasis.  She is followed by pulmonologist.  PMHx:  Past Medical History:  Diagnosis Date  . Active smoker   . Anxiety   . Cervical dysplasia   . Complication of anesthesia    "I'm usually hard to wake up"  . Dyslipidemia   . Endometriosis   . History of nuclear stress test 07/07/2010   dipyridamole; low risk, post-stress EF 65%  . Hypertension   . Migraine   . Osteopenia    08/2006 and 08/2008 Dexa Scans w [facility]   . Pneumonia    hx pneunomia ~2016  . PONV (postoperative nausea and vomiting)   . Sleep apnea    does not wear her CPAP because "it gave me respiratory issues (bronchities and pneumonia)"    Past Surgical History:  Procedure Laterality Date  . BACK SURGERY  2002   RUPTURED DISC   . BIOPSY  06/05/2019   Procedure: BIOPSY;  Surgeon: Carol Ada, MD;  Location: [facility];  Service: Endoscopy;;  . CARDIAC CATHETERIZATION  11/20/2002   patent coronary arteries   . CHOLECYSTECTOMY  2006  . COLPOSCOPY    . ESOPHAGOGASTRODUODENOSCOPY Left 06/05/2019   Procedure: ESOPHAGOGASTRODUODENOSCOPY (EGD);  Surgeon: Carol Ada, MD;  Location: [facility];  Service: Endoscopy;  Laterality: Left;  . GYNECOLOGIC CRYOSURGERY    . INTERCOSTAL NERVE BLOCK Right 05/28/2019   Procedure: Intercostal Nerve Block;  Surgeon: Melrose Nakayama, MD;  Location: [facility];  Service: Thoracic;  Laterality: Right;  . NODE DISSECTION  05/28/2019   Procedure: Node Dissection;  Surgeon: Melrose Nakayama, MD;  Location: [facility] OR;  Service: Thoracic;;  . NOSE SURGERY     X2  . OOPHORECTOMY     BSO  . SHOULDER SURGERY     ROTATOR CUFF X 2  . THORACOSCOPY  05/28/2019   THORASCOPY-WEDGE RESECTION AND  RIGHT  UPPER LOBECTOMY (Right)  . THROAT SURGERY     REMOVAL OF TUMOR  . TONSILLECTOMY    . VAGINAL HYSTERECTOMY  2000   WITH BSO    FAMHx:  Family History  Problem Relation Age of Onset  . Hypertension Mother   . Cancer Mother         LIVER AND LUNG  . Hypertension Brother   . Heart disease Brother   . Hypertension Maternal Grandfather   . Heart disease Maternal Grandfather   . Heart attack Maternal Grandfather     SOCHx:   reports that she quit smoking about 14 months ago. Her smoking use included cigarettes. She has a 42.00 pack-year smoking history. She has never used smokeless tobacco. She reports current alcohol use of about 10.0 standard drinks of alcohol per week. She reports that she does not use drugs.  ALLERGIES:  Allergies  Allergen Reactions  . Iodinated Diagnostic Agents Itching    itching but no hives just after iv contrast injection w/ 13 hr prep, future contrast not advised unless absolutely necessary, 13 hr prep would still be advised.//a.calhoun  . Ioxaglate Itching    itching but no hives just after iv contrast injection w/ 13 hr prep, future contrast not advised unless absolutely necessary, 13 hr prep would still be advised.//a.calhoun  . Omnipaque [Iohexol] Anaphylaxis  . Buprenorphine Hcl Nausea And Vomiting  . Codeine Nausea And Vomiting  . Erythromycin   . Morphine And Related Nausea And Vomiting    ROS: Pertinent items noted in HPI and remainder of comprehensive ROS otherwise negative.  HOME MEDS: Current Outpatient Medications  Medication Sig Dispense Refill  . amLODipine (NORVASC) 2.5 MG tablet TAKE 1 TABLET(2.5 MG) BY MOUTH DAILY 90 tablet 0  . aspirin EC 81 MG tablet Take 81 mg by mouth daily.    . cholestyramine (QUESTRAN) 4 GM/DOSE powder Take 4 g by mouth daily.    Marland Kitchen denosumab (PROLIA) 60 MG/ML SOLN injection Inject 60 mg into the skin every 6 (six) months. Administer in upper arm, thigh, or abdomen    . gabapentin (NEURONTIN) 300 MG capsule TAKE 2 CAPSULES(600 MG) BY MOUTH THREE TIMES DAILY 180 capsule 2  . metoprolol succinate (TOPROL-XL) 25 MG 24 hr tablet Take 1 tablet (25 mg total) by mouth daily. Take with or immediately following a meal. 90 tablet 3  . montelukast  (SINGULAIR) 10 MG tablet Take 10 mg by mouth at bedtime.   0  . nicotine (NICODERM CQ - DOSED IN MG/24 HOURS) 21 mg/24hr patch Place 1 patch (21 mg total) onto the skin daily. 28 patch 1  . OVER THE COUNTER MEDICATION "Our Choice probiotic"    . pantoprazole (PROTONIX) 40 MG tablet Take 1  tablet (40 mg total) by mouth 2 (two) times daily. 60 tablet 3  . rosuvastatin (CRESTOR) 5 MG tablet Take 1 tablet (5 mg total) by mouth daily. 90 tablet 3  . telmisartan-hydrochlorothiazide (MICARDIS HCT) 80-12.5 MG tablet Take 1 tablet by mouth daily. 90 tablet 3  . Tiotropium Bromide Monohydrate (SPIRIVA RESPIMAT) 2.5 MCG/ACT AERS Inhale 2 puffs into the lungs daily. 4 g 6  . Tiotropium Bromide Monohydrate (SPIRIVA RESPIMAT) 2.5 MCG/ACT AERS Inhale 2 puffs into the lungs daily. 4 g 0  . VENTOLIN HFA 108 (90 BASE) MCG/ACT inhaler Inhale 2 puffs into the lungs every 6 (six) hours as needed for wheezing or shortness of breath.      No current facility-administered medications for this visit.    LABS/IMAGING: Results for orders placed or performed in visit on 07/17/20 (from the past 48 hour(s))  CMP ([facility] only)     Status: Abnormal   Collection Time: 07/17/20  3:18 PM  Result Value Ref Range   Sodium 137 135 - 145 mmol/L   Potassium 3.8 3.5 - 5.1 mmol/L   Chloride 92 (L) 98 - 111 mmol/L   CO2 31 22 - 32 mmol/L   Glucose, Bld 94 70 - 99 mg/dL    Comment: Glucose reference range applies only to samples taken after fasting for at least 8 hours.   BUN 8 8 - 23 mg/dL   Creatinine 0.73 0.44 - 1.00 mg/dL   Calcium 9.7 8.9 - 10.3 mg/dL   Total Protein 8.0 6.5 - 8.1 g/dL   Albumin 4.3 3.5 - 5.0 g/dL   AST 28 15 - 41 U/L   ALT 25 0 - 44 U/L   Alkaline Phosphatase 89 38 - 126 U/L   Total Bilirubin 0.6 0.3 - 1.2 mg/dL   GFR, Estimated >60 >60 mL/min    Comment: (NOTE) Calculated using the CKD-EPI Creatinine Equation (2021)    Anion gap 14 5 - 15    Comment: Performed at [facility], [place] [address] [place], [place] [address]  CBC with Differential ([facility] Only)     Status: Abnormal   Collection Time: 07/17/20  3:18 PM  Result Value Ref Range   WBC Count 6.8 4.0 - 10.5 K/uL   RBC 4.90 3.87 - 5.11 MIL/uL   Hemoglobin 16.6 (H) 12.0 - 15.0 g/dL   HCT 48.7 (H) 36.0 - 46.0 %   MCV 99.4 80.0 - 100.0 fL   MCH 33.9 26.0 - 34.0 pg   MCHC 34.1 30.0 - 36.0 g/dL   RDW 13.1 11.5 - 15.5 %   Platelet Count 206 150 - 400 K/uL   nRBC 0.0 0.0 - 0.2 %   Neutrophils Relative % 58 %   Neutro Abs 4.0 1.7 - 7.7 K/uL   Lymphocytes Relative 32 %   Lymphs Abs 2.2 0.7 - 4.0 K/uL   Monocytes Relative 8 %   Monocytes Absolute 0.5 0.1 - 1.0 K/uL   Eosinophils Relative 1 %   Eosinophils Absolute 0.1 0.0 - 0.5 K/uL   Basophils Relative 1 %   Basophils Absolute 0.0 0.0 - 0.1 K/uL   Immature Granulocytes 0 %   Abs Immature Granulocytes 0.02 0.00 - 0.07 K/uL    Comment: Performed at [facility], [place] [address] [place], [place] [address]   CT Chest Wo Contrast  Result Date: 07/18/2020 CLINICAL DATA:  Primary Cancer Type: Lung Imaging Indication: Routine surveillance Interval therapy since last imaging?  No Initial Cancer Diagnosis Date: 05/28/2019; Established by: Biopsy-proven Detailed Pathology: Stage Ia non-small cell lung cancer, adenocarcinoma Primary Tumor location: Right upper lobe. Surgeries: Right upper lobectomy 05/28/2019.  Cholecystectomy. Chemotherapy: No Immunotherapy? No Radiation therapy? No EXAM: CT CHEST WITHOUT CONTRAST TECHNIQUE: Multidetector CT imaging of the chest was performed following the standard protocol without IV contrast. COMPARISON:  Most recent CT chest 01/14/2020.  05/08/2019 PET-CT. FINDINGS: Cardiovascular: The heart size is normal. No substantial pericardial effusion. Coronary artery calcification is evident. Atherosclerotic calcification is noted in the wall of the thoracic aorta. Ascending thoracic aorta measures 4.4  cm diameter. Mediastinum/Nodes: No mediastinal lymphadenopathy. No evidence for gross hilar lymphadenopathy although assessment is limited by the lack of intravenous contrast on today's study. The esophagus has normal imaging features. There is no axillary lymphadenopathy. Lungs/Pleura: Centrilobular and paraseptal emphysema evident. Surgical changes again noted right hemithorax with scattered areas of architectural distortion/scarring. No findings to suggest local recurrence. The soft tissue thickening along staple line is stable in the interval. No new suspicious pulmonary nodule or mass. No focal airspace consolidation. No pleural effusion. Upper Abdomen: 18 mm hypoattenuating lesion in the interpolar left kidney is stable in the interval, compatible with a cyst. Gallbladder surgically absent. Distention of the common bile duct is unchanged in the interval. Musculoskeletal: No worrisome lytic or sclerotic osseous abnormality. Old posterior right rib fractures noted. IMPRESSION: 1. Stable exam. Postsurgical changes in the right lung. No findings to suggest local recurrence or metastatic disease in the thorax. 2. 4.4 cm diameter ascending thoracic aorta. Recommend annual imaging followup by CTA or MRA. This recommendation follows 2010 ACCF/AHA/AATS/ACR/ASA/SCA/SCAI/SIR/STS/SVM Guidelines for the Diagnosis and Management of Patients with Thoracic Aortic Disease. Circulation. 2010; 121: N027-O536. Aortic aneurysm NOS (ICD10-I71.9) 3.  Emphysema (ICD10-J43.9) and Aortic Atherosclerosis (ICD10-170.0) Electronically Signed   By: Misty Stanley [name]   On: 07/18/2020 10:23    VITALS: BP 128/90 (BP Location: Left Arm, Patient Position: Sitting, Cuff Size: Normal)   Pulse 82   Ht 5\' 4"  (1.626 m)   Wt 120 lb (54.4 kg)   BMI 20.60 kg/m   EXAM: General appearance: alert and no distress Neck: no adenopathy, no carotid bruit, no JVD, supple, symmetrical, trachea midline and thyroid not enlarged, symmetric, no  tenderness/mass/nodules Lungs: clear to auscultation bilaterally Heart: regular rate and rhythm, S1, S2 normal, no murmur, click, rub or gallop Abdomen: soft, non-tender; bowel sounds normal; no masses,  no organomegaly Extremities: extremities normal, atraumatic, no cyanosis or edema Pulses: 2+ and symmetric Skin: Skin color, texture, turgor normal. No rashes or lesions Neurologic: Grossly normal  EKG: Normal sinus rhythm 82, lateral ST and T wave changes-personally reviewed  ASSESSMENT:  1. Dyspnea on exertion and fatigue-low risk nuclear stress test (03/2016) 2. Hypertension-controlled 3. Hyperlipidemia- on Crestor 4. Tobacco abuse 5. History of lung nodules/emphysema 6. Primary lung tumor status post resection (04/2019) 7. History of NSVT/Ventricular bigeminy related to electrolyte abnormalities  PLAN: 1.   [name] seems to be doing well now out from her lung tumor resection in January 2021.  This was complicated by some nonsustained VT and ventricular bigeminy related to electrolyte abnormalities but has not recurred.  Cholesterols been well controlled.  Her blood pressure is at goal.  She does get some palpitations but very infrequently on Toprol.  She could take an extra half tablet if necessary for breakthrough.  Plan follow-up with me annually or sooner as necessary.  Pixie Casino, MD, [facility], [place]  Medical Director of the [facility] Attending Cardiologist  Direct Dial: [phone]  Fax: [phone]  Website:  [email or web address] 07/18/2020, 2:48 PM

## 2020-07-18 NOTE — Patient Instructions (Signed)
Medication Instructions:  OK to take extra half-tablet of metoprolol succiante as needed for palpitations   *If you need a refill on your cardiac medications before your next appointment, please call your pharmacy*   Lab Work: NONE If you have labs (blood work) drawn today and your tests are completely normal, you will receive your results only by: Marland Kitchen MyChart Message (if you have MyChart) OR . A paper copy in the mail If you have any lab test that is abnormal or we need to change your treatment, we will call you to review the results.   Testing/Procedures: NONE   Follow-Up: At [facility], you and your health needs are our priority.  As part of our continuing mission to provide you with exceptional heart care, we have created designated Provider Care Teams.  These Care Teams include your primary Cardiologist (physician) and Advanced Practice Providers (APPs -  Physician Assistants and Nurse Practitioners) who all work together to provide you with the care you need, when you need it.  We recommend signing up for the patient portal called "MyChart".  Sign up information is provided on this After Visit Summary.  MyChart is used to connect with patients for Virtual Visits (Telemedicine).  Patients are able to view lab/test results, encounter notes, upcoming appointments, etc.  Non-urgent messages can be sent to your provider as well.   To learn more about what you can do with MyChart, go to [email or web address].    Your next appointment:   12 month(s)  The format for your next appointment:   In Person  Provider:   You may see Pixie Casino, MD or one of the following Advanced Practice Providers on your designated Care Team:    Almyra Deforest, PA-C  Fabian Sharp, PA-C or   Roby Lofts, [place]    Other Instructions

## 2020-07-21 ENCOUNTER — Ambulatory Visit: Payer: Medicare PPO | Admitting: Internal Medicine

## 2020-07-22 ENCOUNTER — Encounter: Payer: Medicare PPO | Admitting: Thoracic Surgery (Cardiothoracic Vascular Surgery)

## 2020-07-23 ENCOUNTER — Other Ambulatory Visit: Payer: Self-pay

## 2020-07-23 ENCOUNTER — Inpatient Hospital Stay: Payer: Medicare PPO | Admitting: Internal Medicine

## 2020-07-23 DIAGNOSIS — C3490 Malignant neoplasm of unspecified part of unspecified bronchus or lung: Secondary | ICD-10-CM

## 2020-07-23 DIAGNOSIS — C3411 Malignant neoplasm of upper lobe, right bronchus or lung: Secondary | ICD-10-CM | POA: Diagnosis not present

## 2020-07-23 NOTE — Progress Notes (Signed)
[place] [phone]   [phone]  OFFICE PROGRESS NOTE  Jani Gravel, MD [address] [place] [address]  DIAGNOSIS: Stage IA (T1b, N0, M0) non-small cell lung cancer, adenocarcinoma presented with right upper lobe nodule  PRIOR THERAPY: Status post right upper lobectomy with lymph node dissection under the care of Dr. Roxan Hockey on May 28, 2019.  CURRENT THERAPY: Observation INTERVAL HISTORY: [name] 70 y.o. female returns to the clinic today for 6 months follow-up visit.  The patient is feeling fine today with no concerning complaints.  She denied having any current chest pain, shortness of breath, cough or hemoptysis.  She has no nausea, vomiting, diarrhea or constipation.  She has no weight loss or night sweats.  She gained few pounds since her last visit.  She denied having any headache or visual changes.  She had repeat CT scan of the chest performed recently and she is here for evaluation and discussion of her risk her results.  MEDICAL HISTORY: Past Medical History:  Diagnosis Date  . Active smoker   . Anxiety   . Cervical dysplasia   . Complication of anesthesia    "I'm usually hard to wake up"  . Dyslipidemia   . Endometriosis   . History of nuclear stress test 07/07/2010   dipyridamole; low risk, post-stress EF 65%  . Hypertension   . Migraine   . Osteopenia    08/2006 and 08/2008 Dexa Scans w Isaiah Blakes   . Pneumonia    hx pneunomia ~2016  . PONV (postoperative nausea and vomiting)   . Sleep apnea    does not wear her CPAP because "it gave me respiratory issues (bronchities and pneumonia)"    ALLERGIES:  is allergic to iodinated diagnostic agents, ioxaglate, omnipaque [iohexol], buprenorphine hcl, codeine, erythromycin, and morphine and related.  MEDICATIONS:  Current Outpatient Medications  Medication Sig Dispense Refill  . amLODipine (NORVASC) 2.5 MG tablet TAKE 1 TABLET(2.5 MG) BY  MOUTH DAILY 90 tablet 0  . aspirin EC 81 MG tablet Take 81 mg by mouth daily.    . cholestyramine (QUESTRAN) 4 GM/DOSE powder Take 4 g by mouth daily.    Marland Kitchen denosumab (PROLIA) 60 MG/ML SOLN injection Inject 60 mg into the skin every 6 (six) months. Administer in upper arm, thigh, or abdomen    . gabapentin (NEURONTIN) 300 MG capsule TAKE 2 CAPSULES(600 MG) BY MOUTH THREE TIMES DAILY 180 capsule 2  . montelukast (SINGULAIR) 10 MG tablet Take 10 mg by mouth at bedtime.   0  . nicotine (NICODERM CQ - DOSED IN MG/24 HOURS) 21 mg/24hr patch Place 1 patch (21 mg total) onto the skin daily. 28 patch 1  . OVER THE COUNTER MEDICATION "Our Choice probiotic"    . pantoprazole (PROTONIX) 40 MG tablet Take 1 tablet (40 mg total) by mouth 2 (two) times daily. 60 tablet 3  . rosuvastatin (CRESTOR) 5 MG tablet Take 1 tablet (5 mg total) by mouth daily. 90 tablet 3  . telmisartan-hydrochlorothiazide (MICARDIS HCT) 80-12.5 MG tablet Take 1 tablet by mouth daily. 90 tablet 3  . Tiotropium Bromide Monohydrate (SPIRIVA RESPIMAT) 2.5 MCG/ACT AERS Inhale 2 puffs into the lungs daily. 4 g 6  . Tiotropium Bromide Monohydrate (SPIRIVA RESPIMAT) 2.5 MCG/ACT AERS Inhale 2 puffs into the lungs daily. 4 g 0  . VENTOLIN HFA 108 (90 BASE) MCG/ACT inhaler Inhale 2 puffs into the lungs every 6 (six) hours as needed for wheezing  or shortness of breath.      No current facility-administered medications for this visit.    SURGICAL HISTORY:  Past Surgical History:  Procedure Laterality Date  . BACK SURGERY  2002   RUPTURED DISC   . BIOPSY  06/05/2019   Procedure: BIOPSY;  Surgeon: Carol Ada, MD;  Location: [facility];  Service: Endoscopy;;  . CARDIAC CATHETERIZATION  11/20/2002   patent coronary arteries   . CHOLECYSTECTOMY  2006  . COLPOSCOPY    . ESOPHAGOGASTRODUODENOSCOPY Left 06/05/2019   Procedure: ESOPHAGOGASTRODUODENOSCOPY (EGD);  Surgeon: Carol Ada, MD;  Location: [facility];  Service: Endoscopy;  Laterality:  Left;  . GYNECOLOGIC CRYOSURGERY    . INTERCOSTAL NERVE BLOCK Right 05/28/2019   Procedure: Intercostal Nerve Block;  Surgeon: Melrose Nakayama, MD;  Location: [facility];  Service: Thoracic;  Laterality: Right;  . NODE DISSECTION  05/28/2019   Procedure: Node Dissection;  Surgeon: Melrose Nakayama, MD;  Location: [facility] OR;  Service: Thoracic;;  . NOSE SURGERY     X2  . OOPHORECTOMY     BSO  . SHOULDER SURGERY     ROTATOR CUFF X 2  . THORACOSCOPY  05/28/2019   THORASCOPY-WEDGE RESECTION AND  RIGHT  UPPER LOBECTOMY (Right)  . THROAT SURGERY     REMOVAL OF TUMOR  . TONSILLECTOMY    . VAGINAL HYSTERECTOMY  2000   WITH BSO    REVIEW OF SYSTEMS:  A comprehensive review of systems was negative.   PHYSICAL EXAMINATION: General appearance: alert, cooperative and no distress Head: Normocephalic, without obvious abnormality, atraumatic Neck: no adenopathy, no JVD, supple, symmetrical, trachea midline and thyroid not enlarged, symmetric, no tenderness/mass/nodules Lymph nodes: Cervical, supraclavicular, and axillary nodes normal. Resp: clear to auscultation bilaterally Back: symmetric, no curvature. ROM normal. No CVA tenderness. Cardio: regular rate and rhythm, S1, S2 normal, no murmur, click, rub or gallop GI: soft, non-tender; bowel sounds normal; no masses,  no organomegaly Extremities: extremities normal, atraumatic, no cyanosis or edema  ECOG PERFORMANCE STATUS: 1 - Symptomatic but completely ambulatory  Blood pressure 137/86, pulse 80, temperature (!) 97.4 F (36.3 C), temperature source Tympanic, resp. rate 18, weight 121 lb 1.6 oz (54.9 kg), SpO2 95 %.  LABORATORY DATA: Lab Results  Component Value Date   WBC 6.8 07/17/2020   HGB 16.6 (H) 07/17/2020   HCT 48.7 (H) 07/17/2020   MCV 99.4 07/17/2020   PLT 206 07/17/2020      Chemistry      Component Value Date/Time   NA 137 07/17/2020 1518   K 3.8 07/17/2020 1518   CL 92 (L) 07/17/2020 1518   CO2 31 07/17/2020 1518    BUN 8 07/17/2020 1518   CREATININE 0.73 07/17/2020 1518      Component Value Date/Time   CALCIUM 9.7 07/17/2020 1518   ALKPHOS 89 07/17/2020 1518   AST 28 07/17/2020 1518   ALT 25 07/17/2020 1518   BILITOT 0.6 07/17/2020 1518       RADIOGRAPHIC STUDIES: CT Chest Wo Contrast  Result Date: 07/18/2020 CLINICAL DATA:  Primary Cancer Type: Lung Imaging Indication: Routine surveillance Interval therapy since last imaging? No Initial Cancer Diagnosis Date: 05/28/2019; Established by: Biopsy-proven Detailed Pathology: Stage Ia non-small cell lung cancer, adenocarcinoma Primary Tumor location: Right upper lobe. Surgeries: Right upper lobectomy 05/28/2019.  Cholecystectomy. Chemotherapy: No Immunotherapy? No Radiation therapy? No EXAM: CT CHEST WITHOUT CONTRAST TECHNIQUE: Multidetector CT imaging of the chest was performed following the standard protocol without IV contrast. COMPARISON:  Most recent  CT chest 01/14/2020.  05/08/2019 PET-CT. FINDINGS: Cardiovascular: The heart size is normal. No substantial pericardial effusion. Coronary artery calcification is evident. Atherosclerotic calcification is noted in the wall of the thoracic aorta. Ascending thoracic aorta measures 4.4 cm diameter. Mediastinum/Nodes: No mediastinal lymphadenopathy. No evidence for gross hilar lymphadenopathy although assessment is limited by the lack of intravenous contrast on today's study. The esophagus has normal imaging features. There is no axillary lymphadenopathy. Lungs/Pleura: Centrilobular and paraseptal emphysema evident. Surgical changes again noted right hemithorax with scattered areas of architectural distortion/scarring. No findings to suggest local recurrence. The soft tissue thickening along staple line is stable in the interval. No new suspicious pulmonary nodule or mass. No focal airspace consolidation. No pleural effusion. Upper Abdomen: 18 mm hypoattenuating lesion in the interpolar left kidney is stable in the  interval, compatible with a cyst. Gallbladder surgically absent. Distention of the common bile duct is unchanged in the interval. Musculoskeletal: No worrisome lytic or sclerotic osseous abnormality. Old posterior right rib fractures noted. IMPRESSION: 1. Stable exam. Postsurgical changes in the right lung. No findings to suggest local recurrence or metastatic disease in the thorax. 2. 4.4 cm diameter ascending thoracic aorta. Recommend annual imaging followup by CTA or MRA. This recommendation follows 2010 ACCF/AHA/AATS/ACR/ASA/SCA/SCAI/SIR/STS/SVM Guidelines for the Diagnosis and Management of Patients with Thoracic Aortic Disease. Circulation. 2010; 121: B262-M355. Aortic aneurysm NOS (ICD10-I71.9) 3.  Emphysema (ICD10-J43.9) and Aortic Atherosclerosis (ICD10-170.0) Electronically Signed   By: Misty Stanley [name]   On: 07/18/2020 10:23    ASSESSMENT AND PLAN: This is a very pleasant 70 years old white female with a stage Ia (T1b, N0, M0) non-small cell lung cancer, adenocarcinoma presented with right upper lobe lung nodule status post right lower lobectomy with lymph node dissection under the care of Dr. Roxan Hockey.  The patient is currently on observation and she is feeling fine. She had repeat CT scan of the chest performed recently.  I personally and independently reviewed the scans and discussed the results with the patient today. Her scan showed no concerning findings for disease recurrence or metastasis. I recommended for the patient to continue on observation with repeat CT scan of the chest in 6 months. She was advised to call immediately if she has any concerning symptoms in the interval. The patient voices understanding of current disease status and treatment options and is in agreement with the current care plan.  All questions were answered. The patient knows to call the clinic with any problems, questions or concerns. We can certainly see the patient much sooner if necessary.  Disclaimer:  This note was dictated with voice recognition software. Similar sounding words can inadvertently be transcribed and may not be corrected upon review.

## 2020-07-28 ENCOUNTER — Telehealth: Payer: Self-pay | Admitting: Internal Medicine

## 2020-07-28 NOTE — Telephone Encounter (Signed)
Scheduled per los. Called and spoke with patient. Confirmed appt 

## 2020-07-29 ENCOUNTER — Ambulatory Visit: Payer: Medicare PPO | Admitting: Thoracic Surgery (Cardiothoracic Vascular Surgery)

## 2020-07-29 ENCOUNTER — Other Ambulatory Visit: Payer: Self-pay

## 2020-07-29 VITALS — BP 150/85 | HR 76 | Resp 20 | Ht 64.0 in | Wt 120.0 lb

## 2020-07-29 DIAGNOSIS — Z902 Acquired absence of lung [part of]: Secondary | ICD-10-CM | POA: Diagnosis not present

## 2020-07-29 DIAGNOSIS — C3491 Malignant neoplasm of unspecified part of right bronchus or lung: Secondary | ICD-10-CM | POA: Diagnosis not present

## 2020-07-29 NOTE — Progress Notes (Signed)
[address]       [place] [address]             [phone]     HPI: [name] returns for a scheduled follow-up visit  [name] is a 71 year old woman with a history of tobacco abuse, COPD, migraines, hypertension, hyperlipidemia, endometriosis, anxiety, thoracic aortic atherosclerosis, ascending thoracic aneurysm, and a stage Ia adenocarcinoma of the right upper lobe.  She had a third robotic right upper lobectomy for a T1, N0 stage Ia adenocarcinoma on 05/28/2019.  Postoperative course was complicated by prolonged air leak, esophagitis, gastric ulcer, and wide-complex tachycardia.  Later she developed intercostal neuralgia pain initially requiring a fentanyl patch but later treated with gabapentin 600 mg p.o. 3 times daily.  She recently saw Dr. Debara Pickett for palpitations.  She saw Dr. Julien Nordmann last week for a 1 year follow-up visit.  There was no evidence of recurrent disease.  For the most part for postsurgical pain has resolved.  She is still occasionally having pain in that area but is currently taking gabapentin 600 mg twice daily.  She will sometimes take a third dose for neuropathic pain in her feet.  She has been having more problems with that than anything else.  Past Medical History:  Diagnosis Date  . Active smoker   . Anxiety   . Cervical dysplasia   . Complication of anesthesia    "I'm usually hard to wake up"  . Dyslipidemia   . Endometriosis   . History of nuclear stress test 07/07/2010   dipyridamole; low risk, post-stress EF 65%  . Hypertension   . Migraine   . Osteopenia    08/2006 and 08/2008 Dexa Scans w Isaiah Blakes   . Pneumonia    hx pneunomia ~2016  . PONV (postoperative nausea and vomiting)   . Sleep apnea    does not wear her CPAP because "it gave me respiratory issues (bronchities and pneumonia)"    Current Outpatient Medications  Medication Sig Dispense Refill  . amLODipine (NORVASC) 2.5 MG tablet TAKE 1 TABLET(2.5 MG) BY MOUTH  DAILY 90 tablet 0  . aspirin EC 81 MG tablet Take 81 mg by mouth daily.    . cholestyramine (QUESTRAN) 4 GM/DOSE powder Take 4 g by mouth daily.    Marland Kitchen denosumab (PROLIA) 60 MG/ML SOLN injection Inject 60 mg into the skin every 6 (six) months. Administer in upper arm, thigh, or abdomen    . gabapentin (NEURONTIN) 300 MG capsule TAKE 2 CAPSULES(600 MG) BY MOUTH THREE TIMES DAILY 180 capsule 2  . metoprolol tartrate (LOPRESSOR) 25 MG tablet Take 25 mg by mouth 2 (two) times daily.    . montelukast (SINGULAIR) 10 MG tablet Take 10 mg by mouth at bedtime.   0  . OVER THE COUNTER MEDICATION "Our Choice probiotic"    . pantoprazole (PROTONIX) 40 MG tablet Take 1 tablet (40 mg total) by mouth 2 (two) times daily. 60 tablet 3  . rosuvastatin (CRESTOR) 5 MG tablet Take 1 tablet (5 mg total) by mouth daily. 90 tablet 3  . telmisartan-hydrochlorothiazide (MICARDIS HCT) 80-12.5 MG tablet Take 1 tablet by mouth daily. 90 tablet 3  . Tiotropium Bromide Monohydrate (SPIRIVA RESPIMAT) 2.5 MCG/ACT AERS Inhale 2 puffs into the lungs daily. 4 g 6  . Tiotropium Bromide Monohydrate (SPIRIVA RESPIMAT) 2.5 MCG/ACT AERS Inhale 2 puffs into the lungs daily. 4 g 0  . VENTOLIN HFA 108 (90 BASE) MCG/ACT inhaler Inhale 2 puffs into  the lungs every 6 (six) hours as needed for wheezing or shortness of breath.      No current facility-administered medications for this visit.    Physical Exam BP (!) 150/85   Pulse 76   Resp 20   Ht 5\' 4"  (1.626 m)   Wt 120 lb (54.4 kg)   SpO2 97% Comment: RA  BMI 20.83 kg/m  70 year old woman in no acute distress Alert and oriented x3 with no focal deficits Lungs diminished at right base but otherwise clear with no rales or wheezing Cardiac regular rate and rhythm, no murmur Incisions well-healed No cervical or supraclavicular adenopathy   Diagnostic Tests: CT CHEST WITHOUT CONTRAST  TECHNIQUE: Multidetector CT imaging of the chest was performed following the standard protocol  without IV contrast.  COMPARISON:  Most recent CT chest 01/14/2020.  05/08/2019 PET-CT.  FINDINGS: Cardiovascular: The heart size is normal. No substantial pericardial effusion. Coronary artery calcification is evident. Atherosclerotic calcification is noted in the wall of the thoracic aorta. Ascending thoracic aorta measures 4.4 cm diameter.  Mediastinum/Nodes: No mediastinal lymphadenopathy. No evidence for gross hilar lymphadenopathy although assessment is limited by the lack of intravenous contrast on today's study. The esophagus has normal imaging features. There is no axillary lymphadenopathy.  Lungs/Pleura: Centrilobular and paraseptal emphysema evident. Surgical changes again noted right hemithorax with scattered areas of architectural distortion/scarring. No findings to suggest local recurrence. The soft tissue thickening along staple line is stable in the interval. No new suspicious pulmonary nodule or mass. No focal airspace consolidation. No pleural effusion.  Upper Abdomen: 18 mm hypoattenuating lesion in the interpolar left kidney is stable in the interval, compatible with a cyst. Gallbladder surgically absent. Distention of the common bile duct is unchanged in the interval.  Musculoskeletal: No worrisome lytic or sclerotic osseous abnormality. Old posterior right rib fractures noted.  IMPRESSION: 1. Stable exam. Postsurgical changes in the right lung. No findings to suggest local recurrence or metastatic disease in the thorax. 2. 4.4 cm diameter ascending thoracic aorta. Recommend annual imaging followup by CTA or MRA. This recommendation follows 2010 ACCF/AHA/AATS/ACR/ASA/SCA/SCAI/SIR/STS/SVM Guidelines for the Diagnosis and Management of Patients with Thoracic Aortic Disease. Circulation. 2010; 121: T614-E315. Aortic aneurysm NOS (ICD10-I71.9) 3.  Emphysema (ICD10-J43.9) and Aortic Atherosclerosis (ICD10-170.0)   Electronically Signed   By: Misty Stanley [name]   On: 07/18/2020 10:23 I personally reviewed the CT images.  There is emphysema.  There is no evidence of recurrent cancer.  There is extensive thoracic aortic atherosclerosis and a 4.3 cm ascending aneurysm  Impression: [name] is a 70 year old woman with a history of tobacco abuse, COPD, migraines, hypertension, hyperlipidemia, endometriosis, anxiety, thoracic aortic atherosclerosis, ascending thoracic aneurysm, and a stage Ia adenocarcinoma of the right upper lobe.   Stage Ia adenocarcinoma right upper lobe-1 year out from right upper lobectomy with no evidence of recurrent disease.  Post surgical intercostal neuralgia-symptoms well controlled with gabapentin.  Currently on 600 mg twice daily.  She has been having a lot of difficulty with peripheral neuropathy so I am not going to try to decrease her dose at this time.  Thoracic aortic atherosclerosis/ascending aortic aneurysm-stable about 4.3 cm.  Needs continued annual follow-up.  She is getting semiannual CTs at this point for lung cancer follow-up.  Hypertension-blood pressure elevated today at 400 systolic.  She has had multiple doctors visits and is check her self at home and has been in the 867 systolic range.  No changes to her medication regimen today.  Plan:  Follow-up as scheduled with Dr. Julien Nordmann Return in 6 months to review CT regards to lung cancer follow-up and ascending aneurysm. Continue gabapentin 600 mg twice daily  Melrose Nakayama, MD [facility] [phone]

## 2020-08-01 DIAGNOSIS — H25043 Posterior subcapsular polar age-related cataract, bilateral: Secondary | ICD-10-CM | POA: Diagnosis not present

## 2020-08-01 DIAGNOSIS — H353131 Nonexudative age-related macular degeneration, bilateral, early dry stage: Secondary | ICD-10-CM | POA: Diagnosis not present

## 2020-08-01 DIAGNOSIS — I10 Essential (primary) hypertension: Secondary | ICD-10-CM | POA: Diagnosis not present

## 2020-08-01 DIAGNOSIS — H25013 Cortical age-related cataract, bilateral: Secondary | ICD-10-CM | POA: Diagnosis not present

## 2020-08-01 DIAGNOSIS — H2511 Age-related nuclear cataract, right eye: Secondary | ICD-10-CM | POA: Diagnosis not present

## 2020-08-01 DIAGNOSIS — H2513 Age-related nuclear cataract, bilateral: Secondary | ICD-10-CM | POA: Diagnosis not present

## 2020-08-15 ENCOUNTER — Other Ambulatory Visit: Payer: Self-pay | Admitting: Thoracic Surgery (Cardiothoracic Vascular Surgery)

## 2020-08-18 ENCOUNTER — Other Ambulatory Visit: Payer: Self-pay | Admitting: *Deleted

## 2020-08-24 HISTORY — PX: CATARACT EXTRACTION W/ INTRAOCULAR LENS IMPLANT: SHX1309

## 2020-09-01 DIAGNOSIS — H2512 Age-related nuclear cataract, left eye: Secondary | ICD-10-CM | POA: Diagnosis not present

## 2020-09-01 DIAGNOSIS — H2511 Age-related nuclear cataract, right eye: Secondary | ICD-10-CM | POA: Diagnosis not present

## 2020-09-02 DIAGNOSIS — H2512 Age-related nuclear cataract, left eye: Secondary | ICD-10-CM | POA: Diagnosis not present

## 2020-09-03 IMAGING — CR DG CHEST 2V
2 series · 2 of 2 positions shown · non-contrast
Comparison: 06/07/2019

CLINICAL DATA: Evaluate chest tube. History of right upper
lobectomy.

EXAM:
CHEST - 2 VIEW

[chest lat]
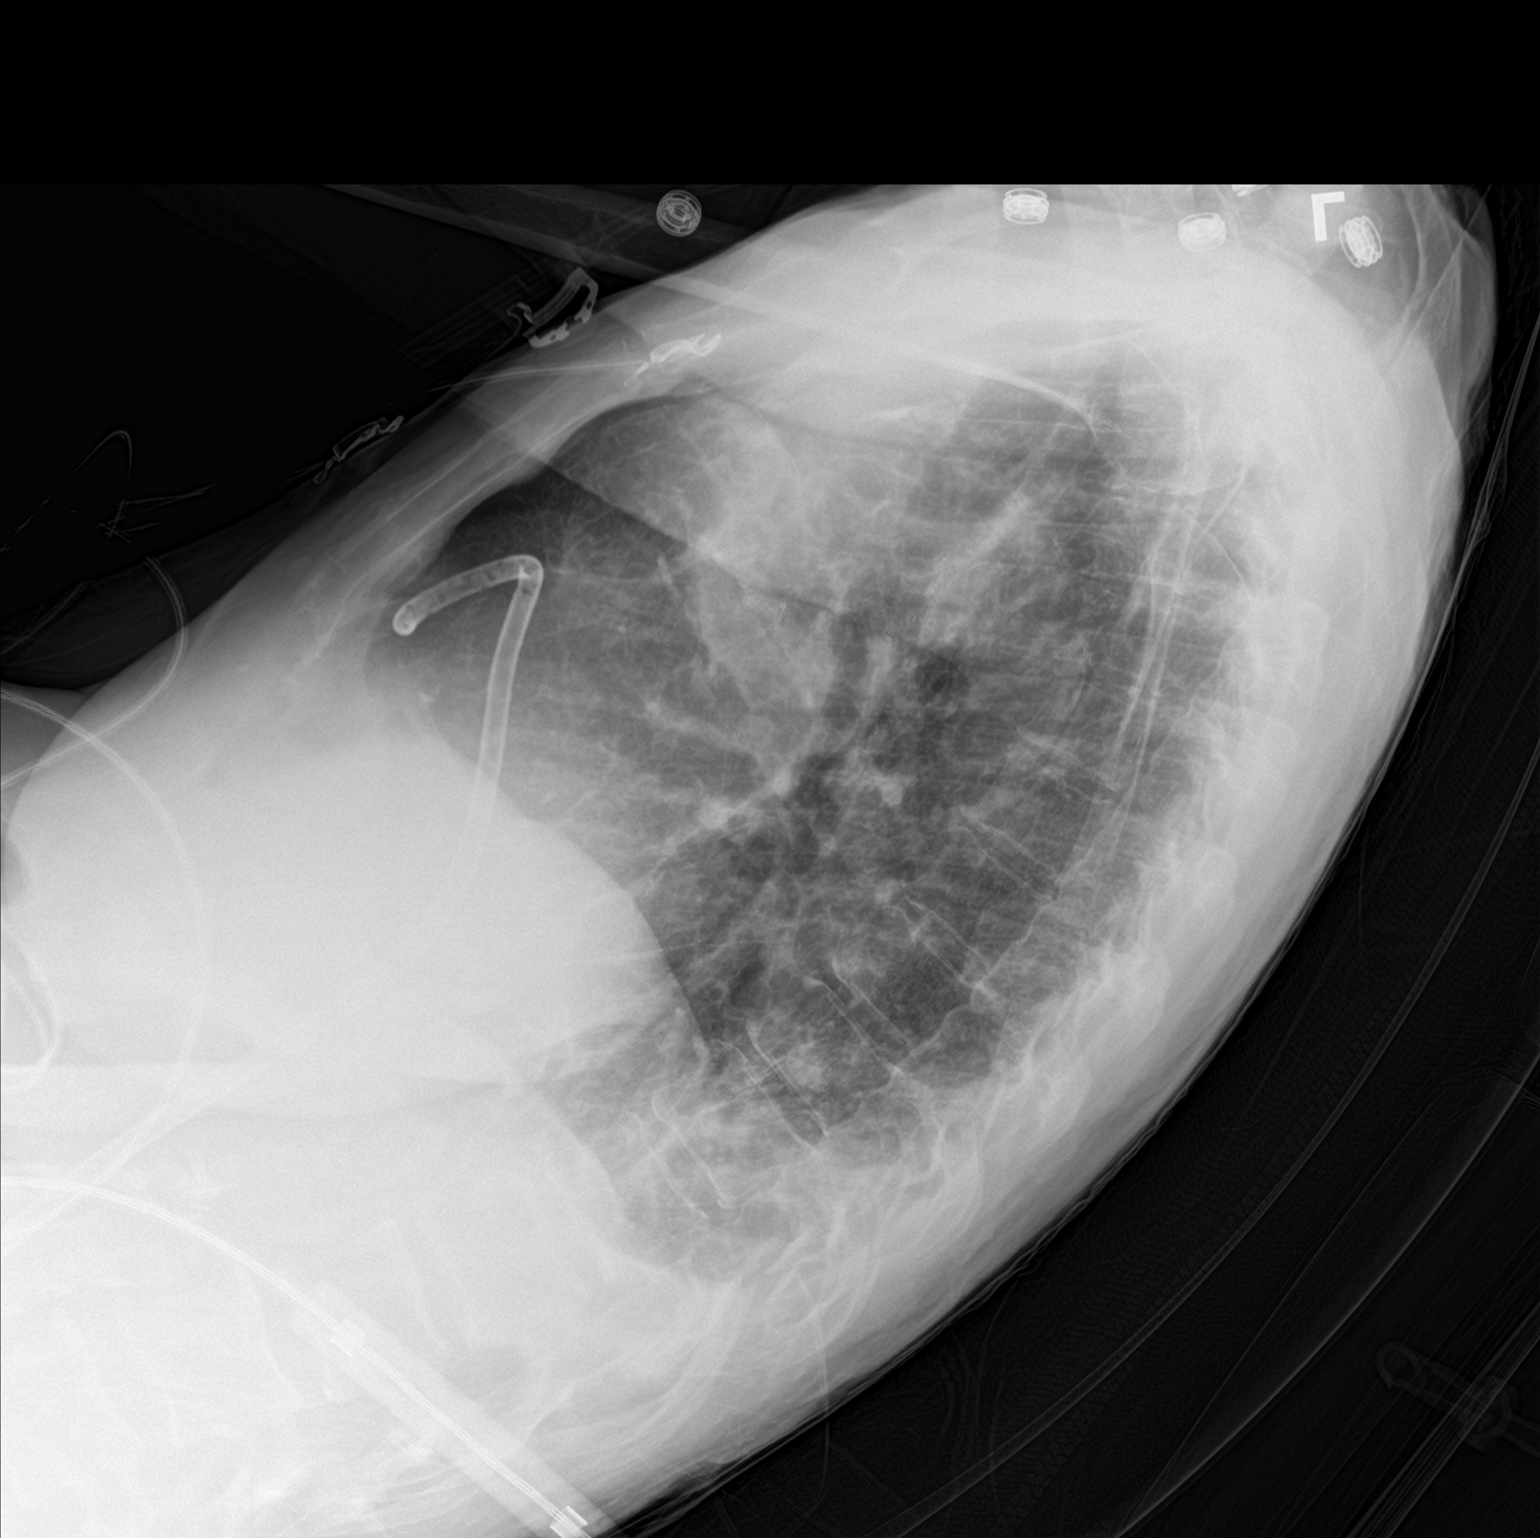

[chest ap]
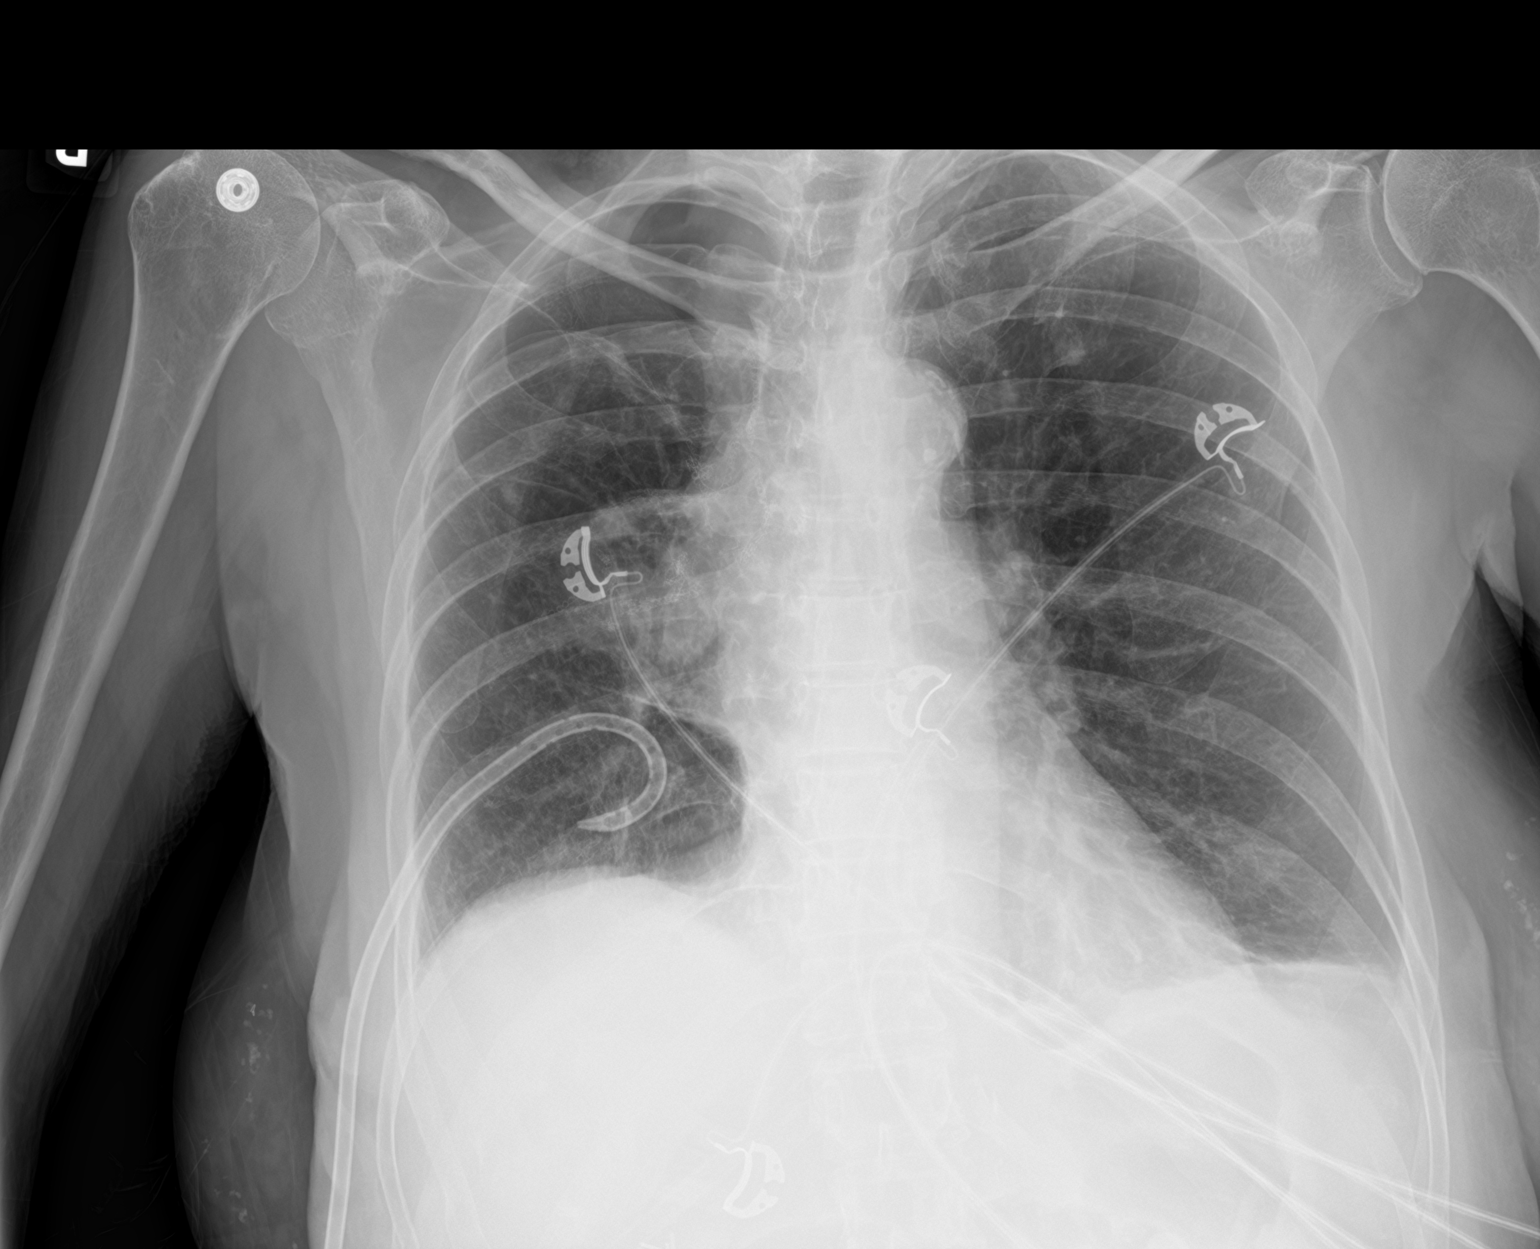

[2 of 2 positions shown; findings below may reference images not displayed]

FINDINGS: Right pigtail chest tube is stable in position. There continues to
be a right apical pneumothorax which is mildly complex and
unchanged. Stable densities in the right hilum with postoperative
changes. Stable volume loss in the right hemithorax. Few densities
at the left lung base and suspect atelectasis and small left pleural
effusion. Atherosclerotic calcifications at the aortic arch. Stable
appearance of the heart and mediastinum.
IMPRESSION: 1. Stable appearance of the right apical pneumothorax. Stable
position of the right chest tube.
2. Atelectasis and probable small effusion at the left lung base.
3. Postsurgical changes and volume loss in the right hemithorax.

## 2020-09-04 IMAGING — DX DG CHEST 1V PORT
1 series · 1 of 1 positions shown · non-contrast
Comparison: 06/08/2019

CLINICAL DATA: Pneumothorax.

EXAM:
PORTABLE CHEST 1 VIEW

[chest]
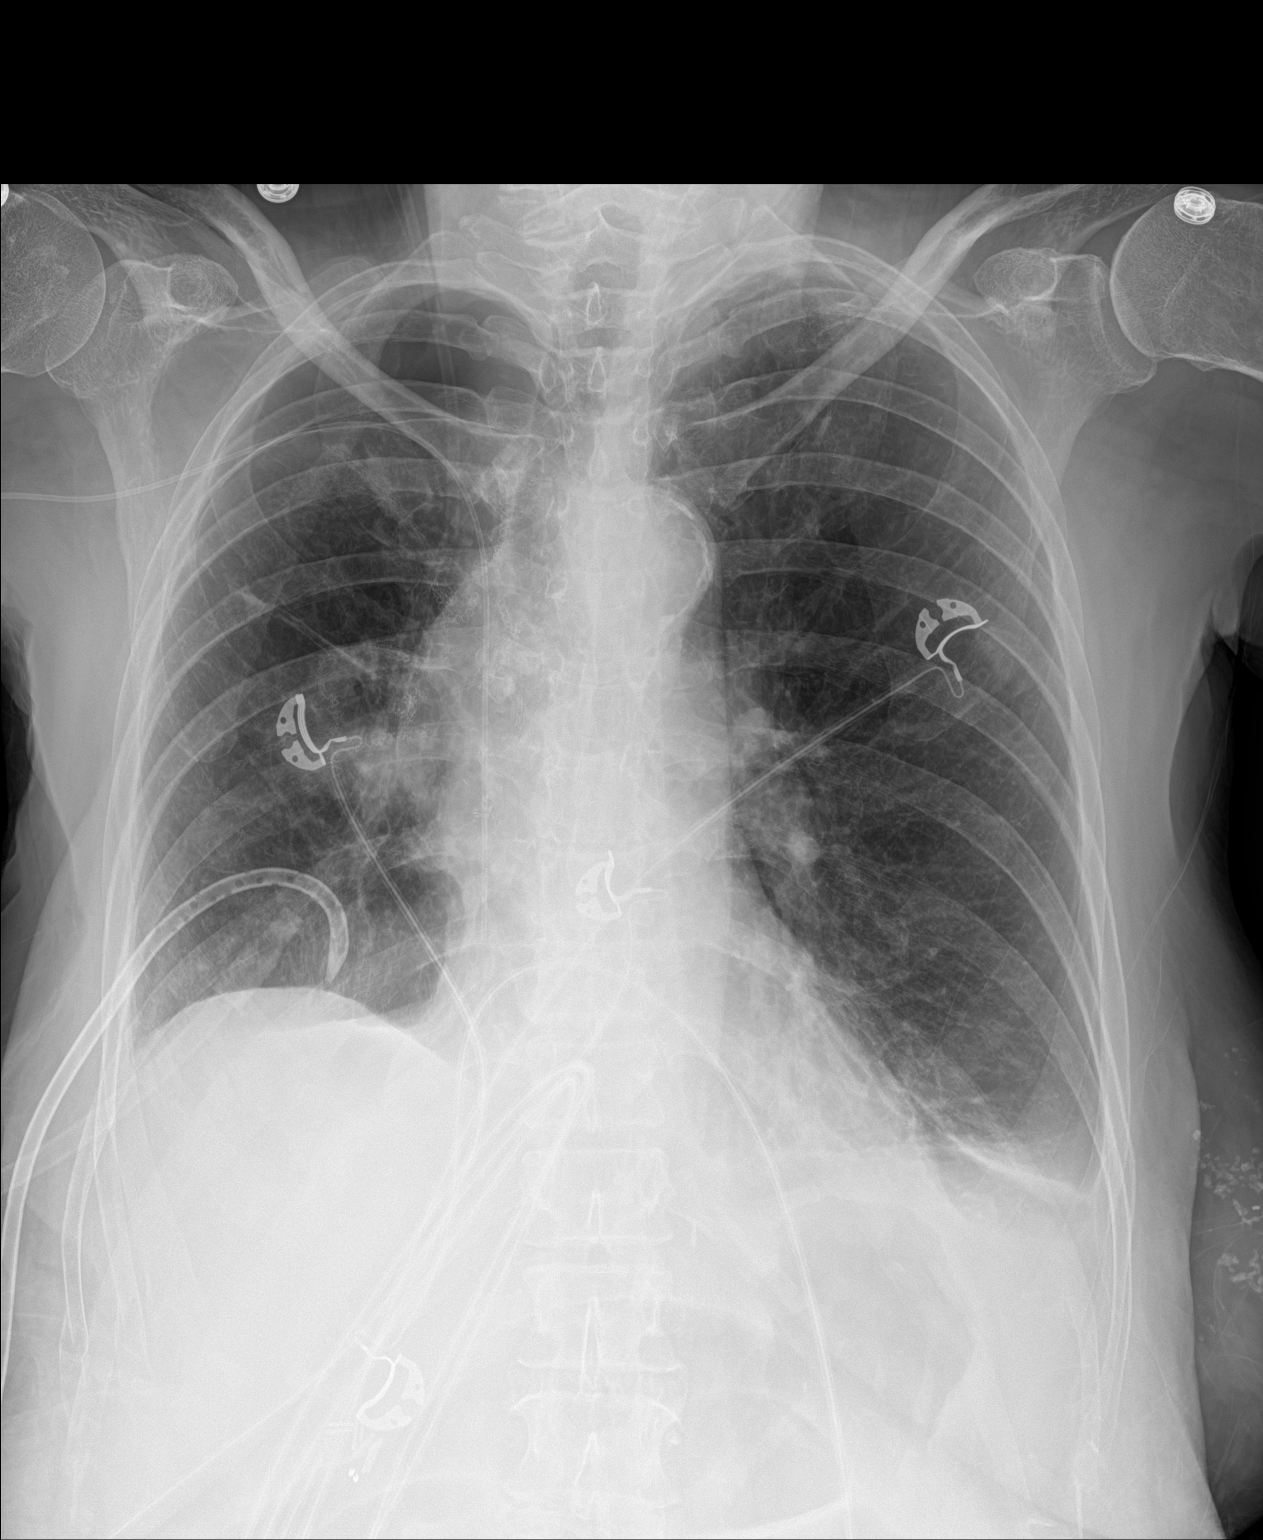

[1 of 1 positions shown; findings below may reference images not displayed]

FINDINGS: Sequelae right upper lobectomy are again identified. A right PICC
has been placed and terminates near the superior cavoatrial
junction. A right chest tube remains in place. The cardiomediastinal
silhouette is unchanged with normal heart size and asymmetric right
hilar densities which may be postsurgical. A small right apical
pneumothorax has minimally enlarged. Left basilar opacities are
unchanged and likely reflect a small pleural effusion and
atelectasis.
IMPRESSION: 1. Minimally increased size of small right apical pneumothorax.
2. Unchanged left basilar atelectasis and small pleural effusion.

## 2020-09-04 IMAGING — DX DG ABD PORTABLE 1V
1 series · 1 of 1 positions shown · non-contrast
Comparison: 06/08/2019

CLINICAL DATA: Ileus.

EXAM:
PORTABLE ABDOMEN - 1 VIEW

[abdomen]
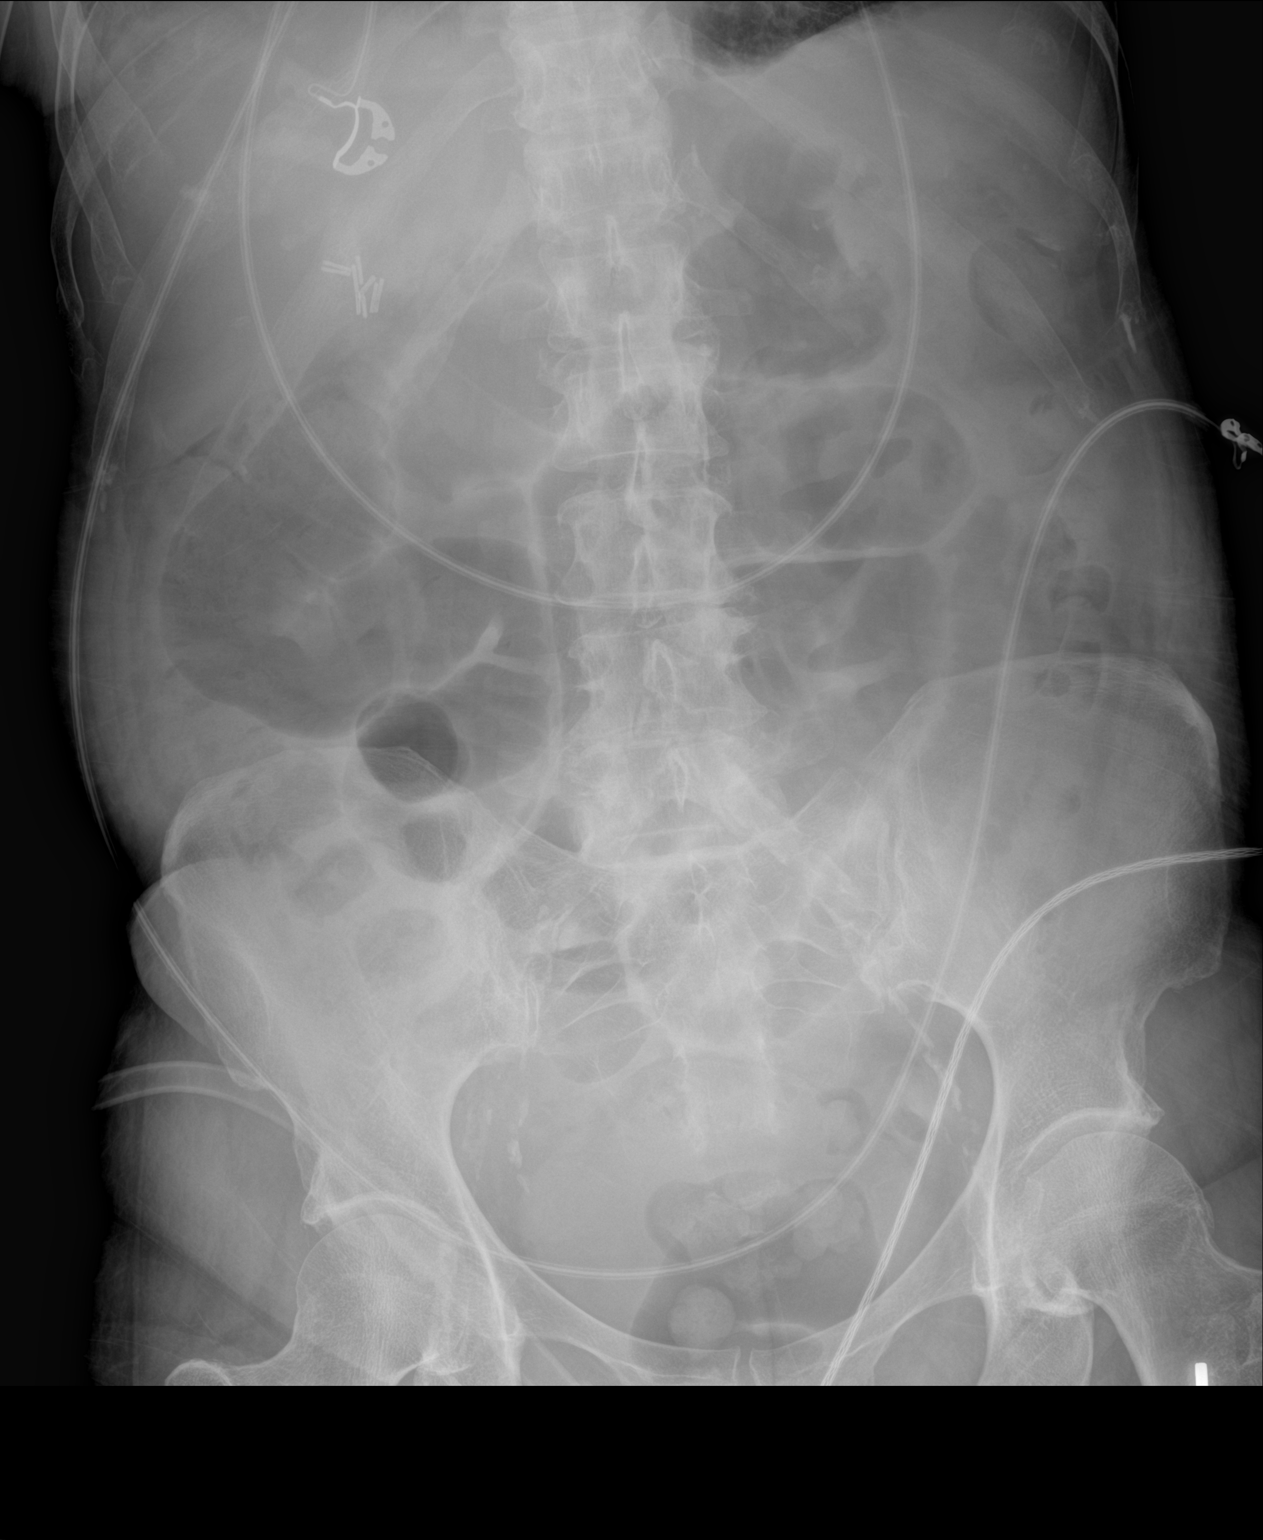

[1 of 1 positions shown; findings below may reference images not displayed]

FINDINGS: Gas is again seen in multiple loops of small and large bowel to the
level of the rectum. Mildly dilated small bowel has not
significantly changed. Right upper quadrant abdominal surgical clips
and atherosclerotic vascular calcifications are noted. The lung
bases are more fully evaluated on separate chest radiograph.
IMPRESSION: Unchanged mild small bowel dilatation suggesting ileus.

## 2020-09-06 IMAGING — DX DG CHEST 1V PORT
1 series · 1 of 1 positions shown · non-contrast
Comparison: 06/10/2019

CLINICAL DATA: Follow-up chest tube

EXAM:
PORTABLE CHEST 1 VIEW

[chest ap]
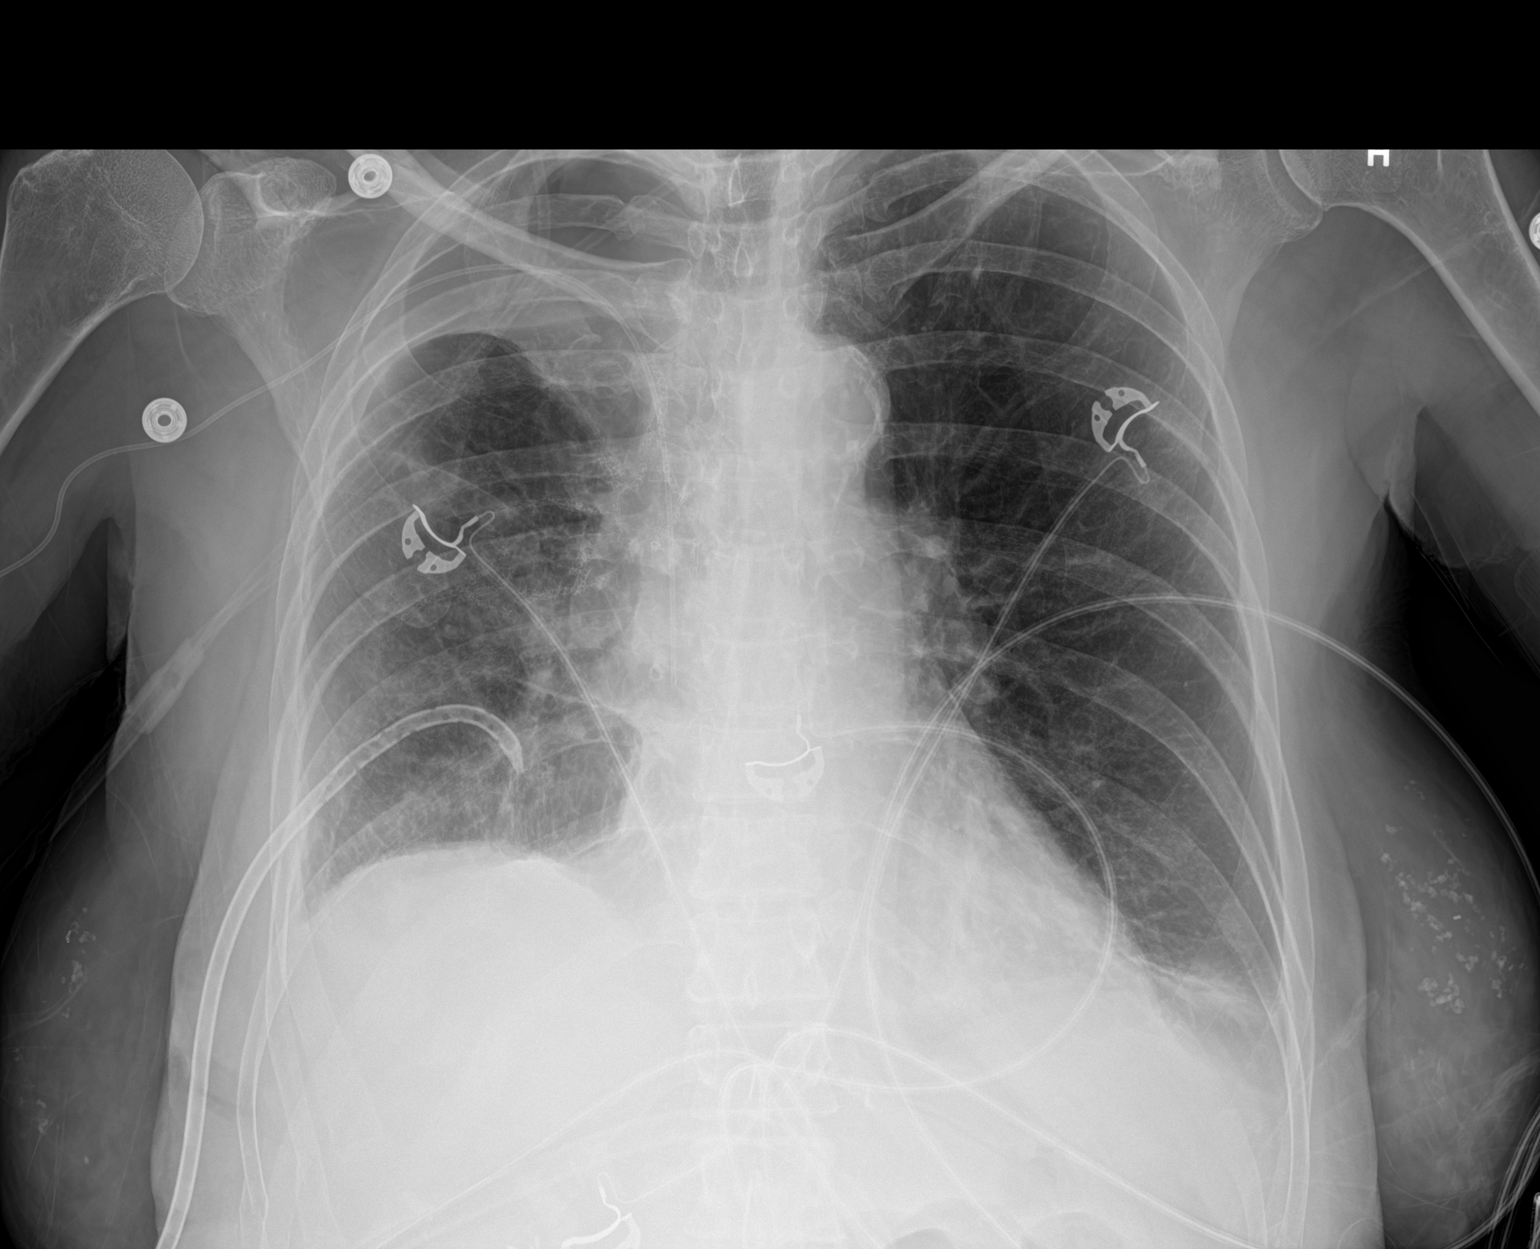

[1 of 1 positions shown; findings below may reference images not displayed]

FINDINGS: Pigtail catheter is again noted in the right lung base stable from
the prior exam. Right-sided PICC line is again seen at the
cavoatrial junction. Cardiac shadow is stable. Stable right apical
pneumothorax is noted with slight increased density which may
represent some developing effusion. No new focal infiltrate is seen.
Persistent left basilar atelectasis is noted.
IMPRESSION: Slight increased density in the right apex which may be related to a
developing effusion in the area of previous pneumothorax. The degree
of right lung aeration is stable. The remainder of the exam is
stable.

## 2020-09-15 DIAGNOSIS — H2511 Age-related nuclear cataract, right eye: Secondary | ICD-10-CM | POA: Diagnosis not present

## 2020-09-15 DIAGNOSIS — H2512 Age-related nuclear cataract, left eye: Secondary | ICD-10-CM | POA: Diagnosis not present

## 2020-10-04 ENCOUNTER — Other Ambulatory Visit: Payer: Self-pay | Admitting: Internal Medicine

## 2020-10-06 DIAGNOSIS — E7800 Pure hypercholesterolemia, unspecified: Secondary | ICD-10-CM | POA: Diagnosis not present

## 2020-10-06 DIAGNOSIS — I10 Essential (primary) hypertension: Secondary | ICD-10-CM | POA: Diagnosis not present

## 2020-10-06 DIAGNOSIS — E559 Vitamin D deficiency, unspecified: Secondary | ICD-10-CM | POA: Diagnosis not present

## 2020-10-06 DIAGNOSIS — F419 Anxiety disorder, unspecified: Secondary | ICD-10-CM | POA: Diagnosis not present

## 2020-10-06 DIAGNOSIS — F17200 Nicotine dependence, unspecified, uncomplicated: Secondary | ICD-10-CM | POA: Diagnosis not present

## 2020-10-06 DIAGNOSIS — K219 Gastro-esophageal reflux disease without esophagitis: Secondary | ICD-10-CM | POA: Diagnosis not present

## 2020-10-11 ENCOUNTER — Other Ambulatory Visit: Payer: Self-pay | Admitting: Internal Medicine

## 2020-11-12 DIAGNOSIS — M79632 Pain in left forearm: Secondary | ICD-10-CM | POA: Diagnosis not present

## 2020-11-12 DIAGNOSIS — T148XXA Other injury of unspecified body region, initial encounter: Secondary | ICD-10-CM | POA: Diagnosis not present

## 2020-11-13 DIAGNOSIS — M7062 Trochanteric bursitis, left hip: Secondary | ICD-10-CM | POA: Diagnosis not present

## 2020-11-23 ENCOUNTER — Other Ambulatory Visit: Payer: Self-pay | Admitting: Thoracic Surgery (Cardiothoracic Vascular Surgery)

## 2020-11-24 ENCOUNTER — Other Ambulatory Visit: Payer: Self-pay

## 2020-11-24 ENCOUNTER — Telehealth: Payer: Self-pay

## 2020-11-24 MED ORDER — GABAPENTIN 300 MG PO CAPS: ORAL_CAPSULE | ORAL | 2 refills | Status: DC

## 2020-11-24 NOTE — Telephone Encounter (Signed)
-----   Message from Melrose Nakayama, MD sent at 11/24/2020 11:56 AM EDT ----- Regarding: RE: RX reorder Thanks  [facility] ----- Message ----- From: Donnella Sham, RN Sent: 11/24/2020  11:11 AM EDT To: Melrose Nakayama, MD Subject: RX reorder                                     Hey,  Sent in another refill of Gabapentin for Ms. Bitting to get her through until follow-up with you in October.  Thanks,  Caryl Pina

## 2020-12-17 DIAGNOSIS — M810 Age-related osteoporosis without current pathological fracture: Secondary | ICD-10-CM | POA: Diagnosis not present

## 2020-12-24 ENCOUNTER — Other Ambulatory Visit: Payer: Self-pay | Admitting: Pulmonary Disease

## 2020-12-31 ENCOUNTER — Other Ambulatory Visit: Payer: Self-pay | Admitting: Internal Medicine

## 2021-01-01 DIAGNOSIS — G4730 Sleep apnea, unspecified: Secondary | ICD-10-CM | POA: Diagnosis not present

## 2021-01-01 DIAGNOSIS — F17210 Nicotine dependence, cigarettes, uncomplicated: Secondary | ICD-10-CM | POA: Diagnosis not present

## 2021-01-01 DIAGNOSIS — G629 Polyneuropathy, unspecified: Secondary | ICD-10-CM | POA: Diagnosis not present

## 2021-01-01 DIAGNOSIS — J439 Emphysema, unspecified: Secondary | ICD-10-CM | POA: Diagnosis not present

## 2021-01-01 DIAGNOSIS — F419 Anxiety disorder, unspecified: Secondary | ICD-10-CM | POA: Diagnosis not present

## 2021-01-01 DIAGNOSIS — E785 Hyperlipidemia, unspecified: Secondary | ICD-10-CM | POA: Diagnosis not present

## 2021-01-01 DIAGNOSIS — I714 Abdominal aortic aneurysm, without rupture: Secondary | ICD-10-CM | POA: Diagnosis not present

## 2021-01-01 DIAGNOSIS — K5190 Ulcerative colitis, unspecified, without complications: Secondary | ICD-10-CM | POA: Diagnosis not present

## 2021-01-01 DIAGNOSIS — H3530 Unspecified macular degeneration: Secondary | ICD-10-CM | POA: Diagnosis not present

## 2021-01-14 DIAGNOSIS — M7062 Trochanteric bursitis, left hip: Secondary | ICD-10-CM | POA: Diagnosis not present

## 2021-01-14 DIAGNOSIS — M25552 Pain in left hip: Secondary | ICD-10-CM | POA: Diagnosis not present

## 2021-01-22 ENCOUNTER — Encounter (HOSPITAL_COMMUNITY): Payer: Self-pay

## 2021-01-22 ENCOUNTER — Ambulatory Visit (HOSPITAL_COMMUNITY)
Admission: RE | Admit: 2021-01-22 | Discharge: 2021-01-22 | Disposition: A | Discharge status: Home / Self Care | Payer: Medicare PPO | Source: Ambulatory Visit | Attending: Internal Medicine | Admitting: Internal Medicine

## 2021-01-22 ENCOUNTER — Other Ambulatory Visit: Payer: Self-pay

## 2021-01-22 ENCOUNTER — Inpatient Hospital Stay: Payer: Medicare PPO | Attending: Internal Medicine

## 2021-01-22 DIAGNOSIS — J439 Emphysema, unspecified: Secondary | ICD-10-CM | POA: Diagnosis not present

## 2021-01-22 DIAGNOSIS — M8580 Other specified disorders of bone density and structure, unspecified site: Secondary | ICD-10-CM | POA: Insufficient documentation

## 2021-01-22 DIAGNOSIS — C3411 Malignant neoplasm of upper lobe, right bronchus or lung: Secondary | ICD-10-CM | POA: Insufficient documentation

## 2021-01-22 DIAGNOSIS — C3490 Malignant neoplasm of unspecified part of unspecified bronchus or lung: Secondary | ICD-10-CM

## 2021-01-22 DIAGNOSIS — Z7982 Long term (current) use of aspirin: Secondary | ICD-10-CM | POA: Diagnosis not present

## 2021-01-22 DIAGNOSIS — Z79899 Other long term (current) drug therapy: Secondary | ICD-10-CM | POA: Insufficient documentation

## 2021-01-22 DIAGNOSIS — R911 Solitary pulmonary nodule: Secondary | ICD-10-CM | POA: Diagnosis not present

## 2021-01-22 DIAGNOSIS — F17210 Nicotine dependence, cigarettes, uncomplicated: Secondary | ICD-10-CM | POA: Insufficient documentation

## 2021-01-22 DIAGNOSIS — I700 Atherosclerosis of aorta: Secondary | ICD-10-CM | POA: Diagnosis not present

## 2021-01-22 DIAGNOSIS — I10 Essential (primary) hypertension: Secondary | ICD-10-CM | POA: Insufficient documentation

## 2021-01-22 LAB — CBC WITH DIFFERENTIAL (CANCER CENTER ONLY)
Abs Immature Granulocytes: 0.03 10*3/uL (ref 0.00–0.07)
Basophils Absolute: 0 10*3/uL (ref 0.0–0.1)
Basophils Relative: 0 %
Eosinophils Absolute: 0 10*3/uL (ref 0.0–0.5)
Eosinophils Relative: 0 %
HCT: 43.9 % (ref 36.0–46.0)
Hemoglobin: 15 g/dL (ref 12.0–15.0)
Immature Granulocytes: 0 %
Lymphocytes Relative: 20 %
Lymphs Abs: 1.7 10*3/uL (ref 0.7–4.0)
MCH: 34.8 pg — ABNORMAL HIGH (ref 26.0–34.0)
MCHC: 34.2 g/dL (ref 30.0–36.0)
MCV: 101.9 fL — ABNORMAL HIGH (ref 80.0–100.0)
Monocytes Absolute: 0.7 10*3/uL (ref 0.1–1.0)
Monocytes Relative: 8 %
Neutro Abs: 6.1 10*3/uL (ref 1.7–7.7)
Neutrophils Relative %: 72 %
Platelet Count: 236 10*3/uL (ref 150–400)
RBC: 4.31 MIL/uL (ref 3.87–5.11)
RDW: 14.1 % (ref 11.5–15.5)
WBC Count: 8.5 10*3/uL (ref 4.0–10.5)
nRBC: 0 % (ref 0.0–0.2)

## 2021-01-22 LAB — CMP (CANCER CENTER ONLY)
ALT: 29 U/L (ref 0–44)
AST: 31 U/L (ref 15–41)
Albumin: 4.4 g/dL (ref 3.5–5.0)
Alkaline Phosphatase: 74 U/L (ref 38–126)
Anion gap: 11 (ref 5–15)
BUN: 10 mg/dL (ref 8–23)
CO2: 30 mmol/L (ref 22–32)
Calcium: 9.8 mg/dL (ref 8.9–10.3)
Chloride: 100 mmol/L (ref 98–111)
Creatinine: 0.62 mg/dL (ref 0.44–1.00)
GFR, Estimated: >60 mL/min (ref >60)
Glucose, Bld: 106 mg/dL — ABNORMAL HIGH (ref 70–99)
Potassium: 3.3 mmol/L — ABNORMAL LOW (ref 3.5–5.1)
Sodium: 141 mmol/L (ref 135–145)
Total Bilirubin: 1.1 mg/dL (ref 0.3–1.2)
Total Protein: 7.7 g/dL (ref 6.5–8.1)

## 2021-01-26 ENCOUNTER — Ambulatory Visit: Payer: Medicare PPO | Admitting: Internal Medicine

## 2021-01-26 ENCOUNTER — Other Ambulatory Visit: Payer: Self-pay

## 2021-01-26 ENCOUNTER — Inpatient Hospital Stay: Payer: Medicare PPO | Attending: Internal Medicine | Admitting: Internal Medicine

## 2021-01-26 VITALS — BP 119/83 | HR 82 | Temp 98.2°F | Resp 20 | Ht 64.0 in | Wt 116.2 lb

## 2021-01-26 DIAGNOSIS — Z85118 Personal history of other malignant neoplasm of bronchus and lung: Secondary | ICD-10-CM

## 2021-01-26 DIAGNOSIS — C3490 Malignant neoplasm of unspecified part of unspecified bronchus or lung: Secondary | ICD-10-CM | POA: Diagnosis not present

## 2021-01-26 DIAGNOSIS — C3491 Malignant neoplasm of unspecified part of right bronchus or lung: Secondary | ICD-10-CM | POA: Diagnosis not present

## 2021-01-26 DIAGNOSIS — Z1231 Encounter for screening mammogram for malignant neoplasm of breast: Secondary | ICD-10-CM | POA: Diagnosis not present

## 2021-01-26 DIAGNOSIS — C3411 Malignant neoplasm of upper lobe, right bronchus or lung: Secondary | ICD-10-CM | POA: Insufficient documentation

## 2021-01-26 DIAGNOSIS — Z902 Acquired absence of lung [part of]: Secondary | ICD-10-CM | POA: Insufficient documentation

## 2021-01-26 NOTE — Progress Notes (Signed)
[place] [phone]   [phone]  OFFICE PROGRESS NOTE  Jani Gravel, MD [address] [place] [address]  DIAGNOSIS: Stage IA (T1b, N0, M0) non-small cell lung cancer, adenocarcinoma presented with right upper lobe nodule   PRIOR THERAPY: Status post right upper lobectomy with lymph node dissection under the care of Dr. Roxan Hockey on May 28, 2019.   CURRENT THERAPY: Observation  INTERVAL HISTORY: Melanie HIGUCHI 70 y.o. female returns to the clinic today for 33-month follow-up visit.  The patient is feeling fine today with no concerning complaints except for few complaints that are unrelated to her lung cancer including generalized fatigue and change in the pattern of her sleep.  She sleeps late at night and wake up late in the afternoon.  She also has some mild peripheral neuropathy.  She has no chest pain, shortness of breath, cough or hemoptysis.  She has no nausea, vomiting, diarrhea or constipation.  She has no headache or visual changes.  She denied having any weight loss or night sweats.  She had repeat CT scan of the chest performed recently and she is here for evaluation and discussion of her scan results.    MEDICAL HISTORY: Past Medical History:  Diagnosis Date   Active smoker    Anxiety    Cervical dysplasia    Complication of anesthesia    "I'm usually hard to wake up"   Dyslipidemia    Endometriosis    History of nuclear stress test 07/07/2010   dipyridamole; low risk, post-stress EF 65%   Hypertension    Migraine    Osteopenia    08/2006 and 08/2008 Dexa Scans w Isaiah Blakes    Pneumonia    hx pneunomia ~2016   PONV (postoperative nausea and vomiting)    Sleep apnea    does not wear her CPAP because "it gave me respiratory issues (bronchities and pneumonia)"    ALLERGIES:  is allergic to iodinated diagnostic agents, ioxaglate, omnipaque [iohexol], budesonide, buprenorphine hcl, codeine, erythromycin,  and morphine and related.  MEDICATIONS:  Current Outpatient Medications  Medication Sig Dispense Refill   amLODipine (NORVASC) 2.5 MG tablet TAKE 1 TABLET(2.5 MG) BY MOUTH DAILY 90 tablet 0   aspirin EC 81 MG tablet Take 81 mg by mouth daily.     cholestyramine (QUESTRAN) 4 GM/DOSE powder Take 4 g by mouth daily.     denosumab (PROLIA) 60 MG/ML SOLN injection Inject 60 mg into the skin every 6 (six) months. Administer in upper arm, thigh, or abdomen     gabapentin (NEURONTIN) 300 MG capsule TAKE 2 CAPSULES(600 MG) BY MOUTH THREE TIMES DAILY 180 capsule 2   metoprolol tartrate (LOPRESSOR) 25 MG tablet Take 25 mg by mouth 2 (two) times daily.     montelukast (SINGULAIR) 10 MG tablet Take 10 mg by mouth at bedtime.   0   OVER THE COUNTER MEDICATION "Our Choice probiotic"     pantoprazole (PROTONIX) 40 MG tablet Take 1 tablet (40 mg total) by mouth 2 (two) times daily. 60 tablet 3   rosuvastatin (CRESTOR) 5 MG tablet TAKE 1 TABLET(5 MG) BY MOUTH DAILY 90 tablet 3   SPIRIVA RESPIMAT 2.5 MCG/ACT AERS INHALE 2 PUFFS INTO THE LUNGS DAILY 4 g 1   telmisartan-hydrochlorothiazide (MICARDIS HCT) 80-12.5 MG tablet TAKE 1 TABLET BY MOUTH DAILY 90 tablet 3   Tiotropium Bromide Monohydrate (SPIRIVA RESPIMAT) 2.5 MCG/ACT AERS Inhale 2 puffs into the lungs daily. 4 g  0   VENTOLIN HFA 108 (90 BASE) MCG/ACT inhaler Inhale 2 puffs into the lungs every 6 (six) hours as needed for wheezing or shortness of breath.      No current facility-administered medications for this visit.    SURGICAL HISTORY:  Past Surgical History:  Procedure Laterality Date   BACK SURGERY  2002   RUPTURED [place]    BIOPSY  06/05/2019   Procedure: BIOPSY;  Surgeon: Carol Ada, MD;  Location: [facility] ENDOSCOPY;  Service: Endoscopy;;   CARDIAC CATHETERIZATION  11/20/2002   patent coronary arteries    CHOLECYSTECTOMY  2006   COLPOSCOPY     ESOPHAGOGASTRODUODENOSCOPY Left 06/05/2019   Procedure: ESOPHAGOGASTRODUODENOSCOPY (EGD);  Surgeon:  Carol Ada, MD;  Location: [place];  Service: Endoscopy;  Laterality: Left;   GYNECOLOGIC CRYOSURGERY     INTERCOSTAL NERVE BLOCK Right 05/28/2019   Procedure: Intercostal Nerve Block;  Surgeon: Melrose Nakayama, MD;  Location: [facility] [facility];  Service: Thoracic;  Laterality: Right;   NODE DISSECTION  05/28/2019   Procedure: Node Dissection;  Surgeon: Melrose Nakayama, MD;  Location: [place];  Service: Thoracic;;   NOSE SURGERY     X2   OOPHORECTOMY     BSO   SHOULDER SURGERY     ROTATOR CUFF X 2   THORACOSCOPY  05/28/2019   THORASCOPY-WEDGE RESECTION AND  RIGHT  UPPER LOBECTOMY (Right)   THROAT SURGERY     REMOVAL OF TUMOR   TONSILLECTOMY     VAGINAL HYSTERECTOMY  2000   WITH BSO    REVIEW OF SYSTEMS:  A comprehensive review of systems was negative except for: Constitutional: positive for fatigue Neurological: positive for paresthesia Behavioral/Psych: positive for sleep disturbance   PHYSICAL EXAMINATION: General appearance: alert, cooperative, and no distress Head: Normocephalic, without obvious abnormality, atraumatic Neck: no adenopathy, no JVD, supple, symmetrical, trachea midline, and thyroid not enlarged, symmetric, no tenderness/mass/nodules Lymph nodes: Cervical, supraclavicular, and axillary nodes normal. Resp: clear to auscultation bilaterally Back: symmetric, no curvature. ROM normal. No CVA tenderness. Cardio: regular rate and rhythm, S1, S2 normal, no murmur, click, rub or gallop GI: soft, non-tender; bowel sounds normal; no masses,  no organomegaly Extremities: extremities normal, atraumatic, no cyanosis or edema  ECOG PERFORMANCE STATUS: 1 - Symptomatic but completely ambulatory  Blood pressure 119/83, pulse 82, temperature 98.2 F (36.8 C), temperature source Tympanic, resp. rate 20, height 5\' 4"  (1.626 m), weight 116 lb 3.2 oz (52.7 kg), SpO2 93 %.  LABORATORY DATA: Lab Results  Component Value Date   WBC 8.5 01/22/2021   HGB 15.0 01/22/2021   HCT  43.9 01/22/2021   MCV 101.9 (H) 01/22/2021   PLT 236 01/22/2021      Chemistry      Component Value Date/Time   NA 141 01/22/2021 1622   K 3.3 (L) 01/22/2021 1622   CL 100 01/22/2021 1622   CO2 30 01/22/2021 1622   BUN 10 01/22/2021 1622   CREATININE 0.62 01/22/2021 1622      Component Value Date/Time   CALCIUM 9.8 01/22/2021 1622   ALKPHOS 74 01/22/2021 1622   AST 31 01/22/2021 1622   ALT 29 01/22/2021 1622   BILITOT 1.1 01/22/2021 1622       RADIOGRAPHIC STUDIES: CT Chest Wo Contrast  Result Date: 01/24/2021 CLINICAL DATA:  Non-small-cell lung cancer.  Restaging. EXAM: CT CHEST WITHOUT CONTRAST TECHNIQUE: Multidetector CT imaging of the chest was performed following the standard protocol without IV contrast. COMPARISON:  07/17/2020 FINDINGS: Cardiovascular: The heart size  is normal. No substantial pericardial effusion. Coronary artery calcification is evident. Moderate atherosclerotic calcification is noted in the wall of the thoracic aorta. Ascending thoracic aorta measures 4.3 cm diameter. Mediastinum/Nodes: No mediastinal lymphadenopathy. No evidence for gross hilar lymphadenopathy although assessment is limited by the lack of intravenous contrast on the current study. The esophagus has normal imaging features. There is no axillary lymphadenopathy. Lungs/Pleura: Centrilobular and paraseptal emphysema evident. Postsurgical scarring again noted parahilar right lung with volume loss in the right hemithorax, features consistent with right upper lobectomy. 3 mm anterior left upper lobe nodule on 66/7 is unchanged. No new suspicious pulmonary nodule or mass. No focal airspace consolidation. There is no evidence of pleural effusion. Upper Abdomen: The liver shows diffusely decreased attenuation suggesting fat deposition. Stable common bile duct dilatation. Musculoskeletal: No worrisome lytic or sclerotic osseous abnormality. Extensive calcification noted in the breast tissue bilaterally,  stable. IMPRESSION: 1. Stable exam. Status post right upper lobectomy. No new or progressive findings to suggest recurrent or metastatic disease. 2. Stable 3 mm anterior left upper lobe pulmonary nodule. 3. Hepatic steatosis. 4. 4.3 cm aneurysm ascending thoracic aorta, stable. Continued attention on surveillance chest CT recommended. 5. Aortic Atherosclerosis (ICD10-I70.0) and Emphysema (ICD10-J43.9). Electronically Signed   By: Misty Stanley [name]   On: 01/24/2021 11:35     ASSESSMENT AND PLAN: This is a very pleasant 70 years old white female with a stage IA (T1b, N0, M0) non-small cell lung cancer, adenocarcinoma presented with right upper lobe lung nodule status post right lower lobectomy with lymph node dissection under the care of Dr. Roxan Hockey.   The patient has been in observation for the last 2 years and she is feeling fine today with no concerning complaints except for sleep disturbance as well as fatigue and mild peripheral neuropathy. She had repeat CT scan of the chest performed recently.  I personally and independently reviewed the scan and discussed the results with the patient today. Her scan showed no concerning findings for disease recurrence or metastasis. I recommended for the patient to continue on observation with repeat CT scan of the chest in 1 year. For the other complaints, she was advised to discuss with her primary care physician for any intervention. She was advised to call immediately if she has any other concerning symptoms in the interval. The patient voices understanding of current disease status and treatment options and is in agreement with the current care plan.  All questions were answered. The patient knows to call the clinic with any problems, questions or concerns. We can certainly see the patient much sooner if necessary.  Disclaimer: This note was dictated with voice recognition software. Similar sounding words can inadvertently be transcribed and may not be  corrected upon review.

## 2021-01-27 ENCOUNTER — Telehealth: Payer: Self-pay | Admitting: Internal Medicine

## 2021-01-27 NOTE — Telephone Encounter (Signed)
Scheduled appt per 10/3 los  - mailed letter with appt date and time

## 2021-02-03 ENCOUNTER — Ambulatory Visit: Payer: Medicare PPO | Admitting: Thoracic Surgery (Cardiothoracic Vascular Surgery)

## 2021-02-03 ENCOUNTER — Encounter: Payer: Self-pay | Admitting: Thoracic Surgery (Cardiothoracic Vascular Surgery)

## 2021-02-03 ENCOUNTER — Other Ambulatory Visit: Payer: Self-pay

## 2021-02-03 VITALS — BP 100/70 | HR 80 | Resp 20 | Ht 64.0 in | Wt 116.0 lb

## 2021-02-03 DIAGNOSIS — Z902 Acquired absence of lung [part of]: Secondary | ICD-10-CM

## 2021-02-03 DIAGNOSIS — C3491 Malignant neoplasm of unspecified part of right bronchus or lung: Secondary | ICD-10-CM | POA: Diagnosis not present

## 2021-02-03 NOTE — Progress Notes (Signed)
[address]       [place] [address]             [phone]     HPI: Ms. [name] returns for a scheduled follow-up regarding her ascending aneurysm and lung cancer.  [name] is a 70 year old woman with a history of tobacco abuse, COPD, migraines, hypertension, hyperlipidemia, endometriosis, anxiety, thoracic aortic atherosclerosis, ascending thoracic aneurysm, stage Ia adenocarcinoma of the right upper lobe status post right upper lobectomy.  She underwent a robotic right upper lobectomy in February 2021 for stage Ia adenocarcinoma.  She had a complicated postoperative course with an air leak, esophagitis, gastric ulcer, and wide-complex tachycardia.  She also had issues with intercostal neuralgia.  Along the way on her CT she was noted to have a 4.3 cm ascending aneurysm.  Last saw her in the office in April.  She was doing well at that time.  She was not having much incisional pain but was having a lot of issues with peripheral neuropathy in her feet.  Her primary complaint today is that she just feels tired and fatigued all the time.  She is not having any chest pain, pressure, or tightness.  She thinks her blood pressure is too low.  Measured 100/70.  Past Medical History:  Diagnosis Date   Active smoker    Anxiety    Cervical dysplasia    Complication of anesthesia    "I'm usually hard to wake up"   Dyslipidemia    Endometriosis    History of nuclear stress test 07/07/2010   dipyridamole; low risk, post-stress EF 65%   Hypertension    Migraine    Osteopenia    08/2006 and 08/2008 Dexa Scans w Isaiah Blakes    Pneumonia    hx pneunomia ~2016   PONV (postoperative nausea and vomiting)    Sleep apnea    does not wear her CPAP because "it gave me respiratory issues (bronchities and pneumonia)"    Current Outpatient Medications  Medication Sig Dispense Refill   amLODipine (NORVASC) 2.5 MG tablet TAKE 1 TABLET(2.5 MG) BY MOUTH DAILY 90 tablet 0    aspirin EC 81 MG tablet Take 81 mg by mouth daily.     cholestyramine (QUESTRAN) 4 GM/DOSE powder Take 4 g by mouth daily.     denosumab (PROLIA) 60 MG/ML SOLN injection Inject 60 mg into the skin every 6 (six) months. Administer in upper arm, thigh, or abdomen     gabapentin (NEURONTIN) 300 MG capsule TAKE 2 CAPSULES(600 MG) BY MOUTH THREE TIMES DAILY 180 capsule 2   metoprolol tartrate (LOPRESSOR) 25 MG tablet Take 25 mg by mouth 2 (two) times daily.     montelukast (SINGULAIR) 10 MG tablet Take 10 mg by mouth at bedtime.   0   OVER THE COUNTER MEDICATION "Our Choice probiotic"     pantoprazole (PROTONIX) 40 MG tablet Take 1 tablet (40 mg total) by mouth 2 (two) times daily. 60 tablet 3   rosuvastatin (CRESTOR) 5 MG tablet TAKE 1 TABLET(5 MG) BY MOUTH DAILY 90 tablet 3   telmisartan-hydrochlorothiazide (MICARDIS HCT) 80-12.5 MG tablet TAKE 1 TABLET BY MOUTH DAILY 90 tablet 3   Tiotropium Bromide Monohydrate (SPIRIVA RESPIMAT) 2.5 MCG/ACT AERS Inhale 2 puffs into the lungs daily. 4 g 0   VENTOLIN HFA 108 (90 BASE) MCG/ACT inhaler Inhale 2 puffs into the lungs every 6 (six) hours as needed for wheezing or shortness of breath.  SPIRIVA RESPIMAT 2.5 MCG/ACT AERS INHALE 2 PUFFS INTO THE LUNGS DAILY 4 g 1   No current facility-administered medications for this visit.    Physical Exam BP 100/70   Pulse 80   Resp 20   Ht 5\' 4"  (1.626 m)   Wt 116 lb (52.6 kg)   SpO2 96% Comment: RA  BMI 19.69 kg/m  70 year old woman in no acute distress Alert and oriented x3 with no focal deficits Lungs diminished to right base but otherwise clear No cervical supraclavicular adenopathy Cardiac regular rate and rhythm  Diagnostic Tests: CT CHEST WITHOUT CONTRAST   TECHNIQUE: Multidetector CT imaging of the chest was performed following the standard protocol without IV contrast.   COMPARISON:  07/17/2020   FINDINGS: Cardiovascular: The heart size is normal. No substantial pericardial effusion.  Coronary artery calcification is evident. Moderate atherosclerotic calcification is noted in the wall of the thoracic aorta. Ascending thoracic aorta measures 4.3 cm diameter.   Mediastinum/Nodes: No mediastinal lymphadenopathy. No evidence for gross hilar lymphadenopathy although assessment is limited by the lack of intravenous contrast on the current study. The esophagus has normal imaging features. There is no axillary lymphadenopathy.   Lungs/Pleura: Centrilobular and paraseptal emphysema evident. Postsurgical scarring again noted parahilar right lung with volume loss in the right hemithorax, features consistent with right upper lobectomy. 3 mm anterior left upper lobe nodule on 66/7 is unchanged. No new suspicious pulmonary nodule or mass. No focal airspace consolidation. There is no evidence of pleural effusion.   Upper Abdomen: The liver shows diffusely decreased attenuation suggesting fat deposition. Stable common bile duct dilatation.   Musculoskeletal: No worrisome lytic or sclerotic osseous abnormality. Extensive calcification noted in the breast tissue bilaterally, stable.   IMPRESSION: 1. Stable exam. Status post right upper lobectomy. No new or progressive findings to suggest recurrent or metastatic disease. 2. Stable 3 mm anterior left upper lobe pulmonary nodule. 3. Hepatic steatosis. 4. 4.3 cm aneurysm ascending thoracic aorta, stable. Continued attention on surveillance chest CT recommended. 5. Aortic Atherosclerosis (ICD10-I70.0) and Emphysema (ICD10-J43.9).     Electronically Signed   By: Misty Stanley [name]   On: 01/24/2021 11:35 I personally reviewed the CT images.  Status post right upper lobectomy with no evidence of recurrence.  Stable 4.3 cm ascending aneurysm with severe thoracic aortic atherosclerosis  Impression: [name] is a 70 year old woman with a history of tobacco abuse, COPD, migraines, hypertension, hyperlipidemia, endometriosis,  anxiety, thoracic aortic atherosclerosis, ascending thoracic aneurysm, stage Ia adenocarcinoma of the right upper lobe status post right upper lobectomy.  Stage Ia adenocarcinoma of the lung-status post right upper lobectomy.  No evidence of recurrent disease.  Dr. Julien Nordmann plans to rescan her again in a year.  Ascending aneurysm/thoracic aortic atherosclerosis-stable at 4.3 cm.  She is on rosuvastatin.  Blood pressure is on the low side at 100/70.  I recommended she check with Dr. Debara Pickett before making any changes to her medical regimen.  Hypertension-blood pressure well controlled.  However she thinks it may be too low and may be contributing to her fatigue.  Managed by Dr. Debara Pickett.  Plan: Return in 1 year after CT chest done by Dr. Thad Ranger, MD [facility] [phone]

## 2021-02-17 ENCOUNTER — Other Ambulatory Visit: Payer: Self-pay | Admitting: Thoracic Surgery (Cardiothoracic Vascular Surgery)

## 2021-02-25 DIAGNOSIS — M7062 Trochanteric bursitis, left hip: Secondary | ICD-10-CM | POA: Diagnosis not present

## 2021-02-25 DIAGNOSIS — M25552 Pain in left hip: Secondary | ICD-10-CM | POA: Diagnosis not present

## 2021-03-04 ENCOUNTER — Other Ambulatory Visit: Payer: Self-pay | Admitting: Pulmonary Disease

## 2021-03-20 ENCOUNTER — Encounter: Payer: Self-pay | Admitting: Pulmonary Disease

## 2021-03-22 NOTE — Telephone Encounter (Signed)
JD please advise. Thanks  

## 2021-03-24 ENCOUNTER — Other Ambulatory Visit: Payer: Self-pay | Admitting: Internal Medicine

## 2021-03-25 ENCOUNTER — Encounter: Payer: Self-pay | Admitting: Pulmonary Disease

## 2021-03-26 ENCOUNTER — Other Ambulatory Visit: Payer: Self-pay | Admitting: Internal Medicine

## 2021-03-26 MED ORDER — METOPROLOL SUCCINATE ER 25 MG PO TB24: 12.5000 mg | ORAL_TABLET | Freq: Every day | ORAL | 1 refills | Status: DC | PRN

## 2021-03-26 NOTE — Telephone Encounter (Signed)
Agree - should see PCP or urgent care about feet pain  Dr Lemmie Evens

## 2021-03-26 NOTE — Telephone Encounter (Signed)
Received refill request from [facility] for meto succ 25mg  QD Refilled med as noted per March 2022 visit - meto succ 12.5mg  QD PRN - palpitations Patient states she has been taking meto succ 25mg  (full tab) daily since her last visit She has noticed BP readings lower at subsequent doctor's appts - reviewed and some readings 696E systolic HR in the 95M She struggled with cutting the pills in half  She recently obtained a new home BP cuff. Advised she check BP/HR daily for 1 week and call with readings or send via MyChart  _______________ She reports issues with her feet hurting, but not claudication. She said the past 2-3 weeks she cannot walk up steps. She said it is like she cannot feel her feet, but yet can feel pain in her toes when the sheets are on them at night. She reached out to her pulmonologist about this, but has not gotten reply. Advised symptoms sounds like neuropathy. She would like to see someone for this, as she reports it is debilitating, but she does not really have a PCP at this time to refer her.   Advised will notify MD and follow up with recommendations

## 2021-03-30 MED ORDER — METOPROLOL SUCCINATE ER 25 MG PO TB24: 25.0000 mg | ORAL_TABLET | Freq: Every day | ORAL | 1 refills | Status: DC

## 2021-03-30 NOTE — Telephone Encounter (Signed)
Relayed message to patient about feet issues -- as noted below.  She will reach out to PA with PCP office     Of note, she got a BP cuff and BP is 124/79 and 126/79 on meto succ 25mg  daily. Refilled this way.

## 2021-03-31 ENCOUNTER — Other Ambulatory Visit: Payer: Self-pay | Admitting: Pulmonary Disease

## 2021-04-03 DIAGNOSIS — H52223 Regular astigmatism, bilateral: Secondary | ICD-10-CM | POA: Diagnosis not present

## 2021-04-03 DIAGNOSIS — Z961 Presence of intraocular lens: Secondary | ICD-10-CM | POA: Diagnosis not present

## 2021-04-03 DIAGNOSIS — H5212 Myopia, left eye: Secondary | ICD-10-CM | POA: Diagnosis not present

## 2021-04-03 DIAGNOSIS — H353132 Nonexudative age-related macular degeneration, bilateral, intermediate dry stage: Secondary | ICD-10-CM | POA: Diagnosis not present

## 2021-04-03 DIAGNOSIS — H02889 Meibomian gland dysfunction of unspecified eye, unspecified eyelid: Secondary | ICD-10-CM | POA: Diagnosis not present

## 2021-04-30 DIAGNOSIS — N390 Urinary tract infection, site not specified: Secondary | ICD-10-CM | POA: Diagnosis not present

## 2021-04-30 DIAGNOSIS — E559 Vitamin D deficiency, unspecified: Secondary | ICD-10-CM | POA: Diagnosis not present

## 2021-04-30 DIAGNOSIS — E7800 Pure hypercholesterolemia, unspecified: Secondary | ICD-10-CM | POA: Diagnosis not present

## 2021-04-30 DIAGNOSIS — Z0000 Encounter for general adult medical examination without abnormal findings: Secondary | ICD-10-CM | POA: Diagnosis not present

## 2021-04-30 DIAGNOSIS — E039 Hypothyroidism, unspecified: Secondary | ICD-10-CM | POA: Diagnosis not present

## 2021-05-04 DIAGNOSIS — J449 Chronic obstructive pulmonary disease, unspecified: Secondary | ICD-10-CM | POA: Diagnosis not present

## 2021-05-04 DIAGNOSIS — E871 Hypo-osmolality and hyponatremia: Secondary | ICD-10-CM | POA: Diagnosis not present

## 2021-05-04 DIAGNOSIS — C3412 Malignant neoplasm of upper lobe, left bronchus or lung: Secondary | ICD-10-CM | POA: Diagnosis not present

## 2021-05-04 DIAGNOSIS — F17200 Nicotine dependence, unspecified, uncomplicated: Secondary | ICD-10-CM | POA: Diagnosis not present

## 2021-05-04 DIAGNOSIS — Z0000 Encounter for general adult medical examination without abnormal findings: Secondary | ICD-10-CM | POA: Diagnosis not present

## 2021-05-04 DIAGNOSIS — E876 Hypokalemia: Secondary | ICD-10-CM | POA: Diagnosis not present

## 2021-05-04 DIAGNOSIS — I10 Essential (primary) hypertension: Secondary | ICD-10-CM | POA: Diagnosis not present

## 2021-05-04 DIAGNOSIS — E7800 Pure hypercholesterolemia, unspecified: Secondary | ICD-10-CM | POA: Diagnosis not present

## 2021-05-04 DIAGNOSIS — E559 Vitamin D deficiency, unspecified: Secondary | ICD-10-CM | POA: Diagnosis not present

## 2021-05-07 DIAGNOSIS — M79642 Pain in left hand: Secondary | ICD-10-CM | POA: Diagnosis not present

## 2021-05-12 DIAGNOSIS — E871 Hypo-osmolality and hyponatremia: Secondary | ICD-10-CM | POA: Diagnosis not present

## 2021-05-12 DIAGNOSIS — E876 Hypokalemia: Secondary | ICD-10-CM | POA: Diagnosis not present

## 2021-05-12 DIAGNOSIS — R718 Other abnormality of red blood cells: Secondary | ICD-10-CM | POA: Diagnosis not present

## 2021-05-14 DIAGNOSIS — E876 Hypokalemia: Secondary | ICD-10-CM | POA: Diagnosis not present

## 2021-05-14 DIAGNOSIS — E538 Deficiency of other specified B group vitamins: Secondary | ICD-10-CM | POA: Diagnosis not present

## 2021-05-14 DIAGNOSIS — G4700 Insomnia, unspecified: Secondary | ICD-10-CM | POA: Diagnosis not present

## 2021-05-20 DIAGNOSIS — M79642 Pain in left hand: Secondary | ICD-10-CM | POA: Diagnosis not present

## 2021-05-20 DIAGNOSIS — S63502A Unspecified sprain of left wrist, initial encounter: Secondary | ICD-10-CM | POA: Diagnosis not present

## 2021-05-26 DIAGNOSIS — H9390 Unspecified disorder of ear, unspecified ear: Secondary | ICD-10-CM | POA: Diagnosis not present

## 2021-05-26 DIAGNOSIS — F5101 Primary insomnia: Secondary | ICD-10-CM | POA: Diagnosis not present

## 2021-06-23 DIAGNOSIS — M810 Age-related osteoporosis without current pathological fracture: Secondary | ICD-10-CM | POA: Diagnosis not present

## 2021-07-26 NOTE — Progress Notes (Signed)
? ?Cardiology Clinic Note  ? ?Patient Name: [name] ?Date of Encounter: 07/29/2021 ? ?Primary Care Provider:  [facility], [place] ?Primary Cardiologist:  Pixie Casino, MD ? ?Patient Profile  ?  ?[name] 71 year old female presents to the clinic today for follow-up evaluation of her essential hypertension and shortness of breath. ? ?Past Medical History  ?  ?Past Medical History:  ?Diagnosis Date  ? Active smoker   ? Anxiety   ? Cervical dysplasia   ? Complication of anesthesia   ? "I'm usually hard to wake up"  ? Dyslipidemia   ? Endometriosis   ? History of nuclear stress test 07/07/2010  ? dipyridamole; low risk, post-stress EF 65%  ? Hypertension   ? Migraine   ? Osteopenia   ? 08/2006 and 08/2008 Dexa Scans w Isaiah Blakes   ? Pneumonia   ? hx pneunomia ~2016  ? PONV (postoperative nausea and vomiting)   ? Sleep apnea   ? does not wear her CPAP because "it gave me respiratory issues (bronchities and pneumonia)"  ? ?Past Surgical History:  ?Procedure Laterality Date  ? BACK SURGERY  2002  ? RUPTURED DISC   ? BIOPSY  06/05/2019  ? Procedure: BIOPSY;  Surgeon: Carol Ada, MD;  Location: [facility];  Service: Endoscopy;;  ? CARDIAC CATHETERIZATION  11/20/2002  ? patent coronary arteries   ? CHOLECYSTECTOMY  2006  ? COLPOSCOPY    ? ESOPHAGOGASTRODUODENOSCOPY Left 06/05/2019  ? Procedure: ESOPHAGOGASTRODUODENOSCOPY (EGD);  Surgeon: Carol Ada, MD;  Location: [facility];  Service: Endoscopy;  Laterality: Left;  ? GYNECOLOGIC CRYOSURGERY    ? INTERCOSTAL NERVE BLOCK Right 05/28/2019  ? Procedure: Intercostal Nerve Block;  Surgeon: Melrose Nakayama, MD;  Location: [facility];  Service: Thoracic;  Laterality: Right;  ? NODE DISSECTION  05/28/2019  ? Procedure: Node Dissection;  Surgeon: Melrose Nakayama, MD;  Location: [facility];  Service: Thoracic;;  ? NOSE SURGERY    ? X2  ? OOPHORECTOMY    ? BSO  ? SHOULDER SURGERY    ? ROTATOR CUFF X 2  ? THORACOSCOPY  05/28/2019  ? THORASCOPY-WEDGE RESECTION AND   RIGHT  UPPER LOBECTOMY (Right)  ? THROAT SURGERY    ? REMOVAL OF TUMOR  ? TONSILLECTOMY    ? VAGINAL HYSTERECTOMY  2000  ? WITH BSO  ? ? ?Allergies ? ?Allergies  ?Allergen Reactions  ? Iodinated Contrast Media Itching  ?  itching but no hives just after iv contrast injection w/ 13 hr prep, future contrast not advised unless absolutely necessary, 13 hr prep would still be advised.//a.calhoun  ? Ioxaglate Itching  ?  itching but no hives just after iv contrast injection w/ 13 hr prep, future contrast not advised unless absolutely necessary, 13 hr prep would still be advised.//a.calhoun  ? Omnipaque [Iohexol] Anaphylaxis  ? Budesonide   ?  Other reaction(s): flushing, vomiting  ? Buprenorphine Hcl Nausea And Vomiting  ? Codeine Nausea And Vomiting  ? Erythromycin   ? Morphine And Related Nausea And Vomiting  ? ? ?History of Present Illness  ?  ?[name] has a PMH of HTN, migraine, adenocarcinoma right lung status post lobectomy (05/28/2019), osteopenia, cervical dysplasia, endometriosis, dyslipidemia, tobacco abuse, and secondary polycythemia. ? ?She was seen in follow-up by Dr. Debara Pickett on 07/18/2020.  She continued to do well from a cardiac standpoint at that time.  She did note infrequent palpitations.  She was instructed to take an extra half pill of metoprolol  as needed for palpitations.  Her blood pressure was well controlled.  She also noted some occasional sighing several times per week which was felt to be related to pulmonary atelectasis.  She was followed by pulmonology.  Annual follow-up was planned. ? ?She presents to the clinic today for follow-up evaluation states she continues to do feel well.  She has noticed some issues with her sleep habits.  She reports that she is noticing more insomnia since she retired about 6 years ago.  Currently she is going to sleep around 4 AM and waking up around 4 PM.  She is working with her PCP on medication for this.  I will give her the sleep hygiene instructions.   She reports only occasional very brief chest discomfort.  She reports that this is not new and has been present for around 10 years.  We reviewed her lung nodule and resection.  She is fairly sedentary.  I will request her labs from her PCP, give her the salty 6 diet sheet/heart healthy diet instructions, have her increase her physical activity as tolerated, and give sleep hygiene instructions.  We will plan follow-up for 12 months and as needed. ? ?Today she denies chest pain, shortness of breath, lower extremity edema, fatigue, palpitations, melena, hematuria, hemoptysis, diaphoresis, weakness, presyncope, syncope, orthopnea, and PND. ? ? ?Home Medications  ?  ?Prior to Admission medications   ?Medication Sig Start Date End Date Taking? Authorizing Provider  ?amLODipine (NORVASC) 2.5 MG tablet TAKE 1 TABLET(2.5 MG) BY MOUTH DAILY 03/24/21   Hilty, Nadean Corwin, MD  ?aspirin EC 81 MG tablet Take 81 mg by mouth daily.    [provider]  ?cholestyramine Lucrezia Starch) 4 GM/DOSE powder Take 4 g by mouth daily.    [provider]  ?denosumab (PROLIA) 60 MG/ML SOLN injection Inject 60 mg into the skin every 6 (six) months. Administer in upper arm, thigh, or abdomen    [provider]  ?gabapentin (NEURONTIN) 300 MG capsule TAKE 2 CAPSULES(600 MG) BY MOUTH THREE TIMES DAILY 02/17/21   Melrose Nakayama, MD  ?metoprolol succinate (TOPROL-XL) 25 MG 24 hr tablet Take 1 tablet (25 mg total) by mouth daily. 03/30/21   Pixie Casino, MD  ?metoprolol tartrate (LOPRESSOR) 25 MG tablet Take 25 mg by mouth 2 (two) times daily.    [provider]  ?montelukast (SINGULAIR) 10 MG tablet Take 10 mg by mouth at bedtime.  02/07/17   [provider]  ?OVER THE COUNTER MEDICATION "Our Choice probiotic"    [provider]  ?pantoprazole (PROTONIX) 40 MG tablet Take 1 tablet (40 mg total) by mouth 2 (two) times daily. 06/13/19   Barrett, Erin R, PA-C  ?rosuvastatin (CRESTOR) 5 MG tablet  TAKE 1 TABLET(5 MG) BY MOUTH DAILY 12/31/20   Pixie Casino, MD  ?[facility] RESPIMAT 2.5 MCG/ACT AERS INHALE 2 PUFFS INTO THE LUNGS DAILY 03/05/21   Freddi Starr, MD  ?telmisartan-hydrochlorothiazide (MICARDIS HCT) 80-12.5 MG tablet TAKE 1 TABLET BY MOUTH DAILY 10/13/20   Hilty, Nadean Corwin, MD  ?Tiotropium Bromide Monohydrate (SPIRIVA RESPIMAT) 2.5 MCG/ACT AERS Inhale 2 puffs into the lungs daily. 05/19/20   Freddi Starr, MD  ?VENTOLIN HFA 108 (90 BASE) MCG/ACT inhaler Inhale 2 puffs into the lungs every 6 (six) hours as needed for wheezing or shortness of breath.  12/13/12   [provider]  ? ? ?Family History  ?  ?Family History  ?Problem Relation Age of Onset  ? Hypertension Mother   ?  Cancer Mother   ?     LIVER AND LUNG  ? Hypertension Brother   ? Heart disease Brother   ? Hypertension Maternal Grandfather   ? Heart disease Maternal Grandfather   ? Heart attack Maternal Grandfather   ? ?She indicated that the status of her mother is unknown. She indicated that the status of her brother is unknown. She indicated that the status of her maternal grandfather is unknown. ? ?Social History  ?  ?Social History  ? ?Socioeconomic History  ? Marital status: Divorced  ?  Spouse name: Not on file  ? Number of children: Not on file  ? Years of education: Not on file  ? Highest education level: Not on file  ?Occupational History  ? Not on file  ?Tobacco Use  ? Smoking status: Former  ?  Packs/day: 1.00  ?  Years: 42.00  ?  Pack years: 42.00  ?  Types: Cigarettes  ?  Quit date: 05/18/2019  ?  Years since quitting: 2.2  ? Smokeless tobacco: Never  ?Vaping Use  ? Vaping Use: Never used  ?Substance and Sexual Activity  ? Alcohol use: Yes  ?  Alcohol/week: 10.0 standard drinks  ?  Types: 10 Glasses of wine per week  ?  Comment: ~10/week  ? Drug use: No  ? Sexual activity: Not Currently  ?  Birth control/protection: Surgical  ?  Comment: INTERCOUSRE AGE 18,SEXUAL PARTNERS MORE THAN 5  ?Other Topics Concern  ? Not  on file  ?Social History Narrative  ? Not on file  ? ?Social Determinants of Health  ? ?Financial Resource Strain: Not on file  ?Food Insecurity: Not on file  ?Transportation Needs: Not on file  ?Physical

## 2021-07-29 ENCOUNTER — Encounter: Payer: Self-pay | Admitting: General Practice

## 2021-07-29 ENCOUNTER — Ambulatory Visit: Payer: Medicare PPO | Admitting: General Practice

## 2021-07-29 VITALS — BP 130/82 | HR 80 | Ht 64.0 in | Wt 116.6 lb

## 2021-07-29 DIAGNOSIS — R002 Palpitations: Secondary | ICD-10-CM

## 2021-07-29 DIAGNOSIS — R5383 Other fatigue: Secondary | ICD-10-CM | POA: Diagnosis not present

## 2021-07-29 DIAGNOSIS — R0609 Other forms of dyspnea: Secondary | ICD-10-CM

## 2021-07-29 DIAGNOSIS — I10 Essential (primary) hypertension: Secondary | ICD-10-CM

## 2021-07-29 DIAGNOSIS — G4700 Insomnia, unspecified: Secondary | ICD-10-CM

## 2021-07-29 DIAGNOSIS — R911 Solitary pulmonary nodule: Secondary | ICD-10-CM

## 2021-07-29 DIAGNOSIS — E7800 Pure hypercholesterolemia, unspecified: Secondary | ICD-10-CM | POA: Diagnosis not present

## 2021-07-29 NOTE — Patient Instructions (Signed)
Medication Instructions:  ?The current medical regimen is effective;  continue present plan and medications as directed. Please refer to the Current Medication list given to you today.  ? ?*If you need a refill on your cardiac medications before your next appointment, please call your pharmacy* ? ?Lab Work:   Testing/Procedures:  ?NONE    NONE ? ?Special Instructions ?PLEASE READ AND FOLLOW SALTY 6-ATTACHED-1,800mg  daily ? ?PLEASE INCREASE PHYSICAL ACTIVITY AS TOLERATED  ? ?PLEASE READ AND FOLLOW SLEEP TIPS-ATTACHED ? ?Follow-Up: ?Your next appointment:  12 month(s) In Person with Melanie Casino, MD  ? ?Please call our office 2 months in advance to schedule this appointment  :1 ? ?At [facility], you and your health needs are our priority.  As part of our continuing mission to provide you with exceptional heart care, we have created designated Provider Care Teams.  These Care Teams include your primary Cardiologist (physician) and Advanced Practice Providers (APPs -  Physician Assistants and Nurse Practitioners) who all work together to provide you with the care you need, when you need it. ? ?We recommend signing up for the patient portal called "MyChart".  Sign up information is provided on this After Visit Summary.  MyChart is used to connect with patients for Virtual Visits (Telemedicine).  Patients are able to view lab/test results, encounter notes, upcoming appointments, etc.  Non-urgent messages can be sent to your provider as well.   ?To learn more about what you can do with MyChart, go to [email or web address].   ? ? ? ?        6 SALTY THINGS TO AVOID     1,800MG  DAILY ? ? ? ? ?Heart-Healthy Eating Plan ?Heart-healthy meal planning includes: ?Eating less unhealthy fats. ?Eating more healthy fats. ?Making other changes in your diet. ?Talk with your doctor or a diet specialist (dietitian) to create an eating plan that is right for you. ?What is my plan? ?What are tips for following this  plan? ?Cooking ?Avoid frying your food. Try to bake, boil, grill, or broil it instead. You can also reduce fat by: ?Removing the skin from poultry. ?Removing all visible fats from meats. ?Steaming vegetables in water or broth. ?Meal planning ? ?At meals, divide your plate into four equal parts: ?Fill one-half of your plate with vegetables and green salads. ?Fill one-fourth of your plate with whole grains. ?Fill one-fourth of your plate with lean protein foods. ?Eat 4-5 servings of vegetables per day. A serving of vegetables is: ?1 cup of raw or cooked vegetables. ?2 cups of raw leafy greens. ?Eat 4-5 servings of fruit per day. A serving of fruit is: ?1 medium whole fruit. ?? cup of dried fruit. ?? cup of fresh, frozen, or canned fruit. ?? cup of 100% fruit juice. ?Eat more foods that have soluble fiber. These are apples, broccoli, carrots, beans, peas, and barley. Try to get 20-30 g of fiber per day. ?Eat 4-5 servings of nuts, legumes, and seeds per week: ?1 serving of dried beans or legumes equals ? cup after being cooked. ?1 serving of nuts is ? cup. ?1 serving of seeds equals 1 tablespoon. ?General information ?Eat more home-cooked food. Eat less restaurant, buffet, and fast food. ?Limit or avoid alcohol. ?Limit foods that are high in starch and sugar. ?Avoid fried foods. ?Lose weight if you are overweight. ?Keep track of how much salt (sodium) you eat. This is important if you have high blood pressure. Ask your doctor to tell you more about this. ?Try to  add vegetarian meals each week. ?Fats ?Choose healthy fats. These include olive oil and canola oil, flaxseeds, walnuts, almonds, and seeds. ?Eat more omega-3 fats. These include salmon, mackerel, sardines, tuna, flaxseed oil, and ground flaxseeds. Try to eat fish at least 2 times each week. ?Check food labels. Avoid foods with trans fats or high amounts of saturated fat. ?Limit saturated fats. ?These are often found in animal products, such as meats, butter, and  cream. ?These are also found in plant foods, such as palm oil, palm kernel oil, and coconut oil. ?Avoid foods with partially hydrogenated oils in them. These have trans fats. Examples are stick margarine, some tub margarines, cookies, crackers, and other baked goods. ?What foods can I eat? ?Fruits ?All fresh, canned (in natural juice), or frozen fruits. ?Vegetables ?Fresh or frozen vegetables (raw, steamed, roasted, or grilled). Green salads. ?Grains ?Most grains. Choose whole wheat and whole grains most of the time. Rice and pasta, including brown rice and pastas made with whole wheat. ?Meats and other proteins ?Lean, well-trimmed beef, veal, pork, and lamb. Chicken and [place] without skin. All fish and shellfish. Wild duck, rabbit, pheasant, and venison. Egg whites or low-cholesterol egg substitutes. Dried beans, peas, lentils, and tofu. Seeds and most nuts. ?Dairy ?Low-fat or nonfat cheeses, including ricotta and mozzarella. Skim or 1% milk that is liquid, powdered, or evaporated. Buttermilk that is made with low-fat milk. Nonfat or low-fat yogurt. ?Fats and oils ?Non-hydrogenated (trans-free) margarines. Vegetable oils, including soybean, sesame, sunflower, olive, peanut, safflower, corn, canola, and cottonseed. Salad dressings or mayonnaise made with a vegetable oil. ?Beverages ?Mineral water. Coffee and tea. Diet carbonated beverages. ?Sweets and desserts ?Sherbet, gelatin, and fruit ice. Small amounts of dark chocolate. ?Limit all sweets and desserts. ?Seasonings and condiments ?All seasonings and condiments. ?The items listed above may not be a complete list of foods and drinks you can eat. Contact a dietitian for more options. ?What foods should I avoid? ?Fruits ?Canned fruit in heavy syrup. Fruit in cream or butter sauce. Fried fruit. Limit coconut. ?Vegetables ?Vegetables cooked in cheese, cream, or butter sauce. Fried vegetables. ?Grains ?Breads that are made with saturated or trans fats, oils, or whole  milk. Croissants. Sweet rolls. Donuts. High-fat crackers, such as cheese crackers. ?Meats and other proteins ?Fatty meats, such as hot dogs, ribs, sausage, bacon, rib-eye roast or steak. High-fat deli meats, such as salami and bologna. Caviar. Domestic duck and goose. Organ meats, such as liver. ?Dairy ?Cream, sour cream, cream cheese, and creamed cottage cheese. Whole-milk cheeses. Whole or 2% milk that is liquid, evaporated, or condensed. Whole buttermilk. Cream sauce or high-fat cheese sauce. Yogurt that is made from whole milk. ?Fats and oils ?Meat fat, or shortening. Cocoa butter, hydrogenated oils, palm oil, coconut oil, palm kernel oil. Solid fats and shortenings, including bacon fat, salt pork, lard, and butter. Nondairy cream substitutes. Salad dressings with cheese or sour cream. ?Beverages ?Regular sodas and juice drinks with added sugar. ?Sweets and desserts ?Frosting. Pudding. Cookies. Cakes. Pies. Milk chocolate or white chocolate. Buttered syrups. Full-fat ice cream or ice cream drinks. ?The items listed above may not be a complete list of foods and drinks to avoid. Contact a dietitian for more information. ?Summary ?Heart-healthy meal planning includes eating less unhealthy fats, eating more healthy fats, and making other changes in your diet. ?Eat a balanced diet. This includes fruits and vegetables, low-fat or nonfat dairy, lean protein, nuts and legumes, whole grains, and heart-healthy oils and fats. ?This information is not intended  to replace advice given to you by your health care provider. Make sure you discuss any questions you have with your health care provider. ?Document Revised: 08/21/2020 Document Reviewed: 08/21/2020 ?Elsevier Patient Education ? 2022 [place]. ? ? ?Quality Sleep Information, Adult ?Quality sleep is important for your mental and physical health. It also improves your quality of life. Quality sleep means you: ?Are asleep for most of the time you are in bed. ?Fall  asleep within 30 minutes. ?Wake up no more than once a night.  ?Are awake for no longer than 20 minutes if you do wake up during the night. ?Most adults need 7-8 hours of quality sleep each night. ?How can poor sleep af

## 2021-08-05 ENCOUNTER — Other Ambulatory Visit: Payer: Self-pay | Admitting: Internal Medicine

## 2021-08-19 DIAGNOSIS — D229 Melanocytic nevi, unspecified: Secondary | ICD-10-CM | POA: Diagnosis not present

## 2021-08-19 DIAGNOSIS — H61001 Unspecified perichondritis of right external ear: Secondary | ICD-10-CM | POA: Diagnosis not present

## 2021-08-19 DIAGNOSIS — L578 Other skin changes due to chronic exposure to nonionizing radiation: Secondary | ICD-10-CM | POA: Diagnosis not present

## 2021-08-19 DIAGNOSIS — L853 Xerosis cutis: Secondary | ICD-10-CM | POA: Diagnosis not present

## 2021-08-19 DIAGNOSIS — D692 Other nonthrombocytopenic purpura: Secondary | ICD-10-CM | POA: Diagnosis not present

## 2021-08-19 DIAGNOSIS — L814 Other melanin hyperpigmentation: Secondary | ICD-10-CM | POA: Diagnosis not present

## 2021-08-19 DIAGNOSIS — L821 Other seborrheic keratosis: Secondary | ICD-10-CM | POA: Diagnosis not present

## 2021-08-19 DIAGNOSIS — D1801 Hemangioma of skin and subcutaneous tissue: Secondary | ICD-10-CM | POA: Diagnosis not present

## 2021-09-27 ENCOUNTER — Other Ambulatory Visit: Payer: Self-pay | Admitting: Internal Medicine

## 2021-10-05 ENCOUNTER — Encounter: Payer: Self-pay | Admitting: Internal Medicine

## 2021-10-06 DIAGNOSIS — Z961 Presence of intraocular lens: Secondary | ICD-10-CM | POA: Diagnosis not present

## 2021-10-06 DIAGNOSIS — H5201 Hypermetropia, right eye: Secondary | ICD-10-CM | POA: Diagnosis not present

## 2021-10-06 DIAGNOSIS — H02889 Meibomian gland dysfunction of unspecified eye, unspecified eyelid: Secondary | ICD-10-CM | POA: Diagnosis not present

## 2021-10-06 DIAGNOSIS — H524 Presbyopia: Secondary | ICD-10-CM | POA: Diagnosis not present

## 2021-10-06 DIAGNOSIS — H52223 Regular astigmatism, bilateral: Secondary | ICD-10-CM | POA: Diagnosis not present

## 2021-10-06 DIAGNOSIS — H353132 Nonexudative age-related macular degeneration, bilateral, intermediate dry stage: Secondary | ICD-10-CM | POA: Diagnosis not present

## 2021-11-02 DIAGNOSIS — E7801 Familial hypercholesterolemia: Secondary | ICD-10-CM | POA: Diagnosis not present

## 2021-11-02 LAB — LAB REPORT - SCANNED: EGFR (Non-African Amer.): 102

## 2021-11-05 DIAGNOSIS — M25571 Pain in right ankle and joints of right foot: Secondary | ICD-10-CM | POA: Diagnosis not present

## 2021-11-09 DIAGNOSIS — E559 Vitamin D deficiency, unspecified: Secondary | ICD-10-CM | POA: Diagnosis not present

## 2021-11-09 DIAGNOSIS — E7800 Pure hypercholesterolemia, unspecified: Secondary | ICD-10-CM | POA: Diagnosis not present

## 2021-11-09 DIAGNOSIS — F17200 Nicotine dependence, unspecified, uncomplicated: Secondary | ICD-10-CM | POA: Diagnosis not present

## 2021-11-09 DIAGNOSIS — F419 Anxiety disorder, unspecified: Secondary | ICD-10-CM | POA: Diagnosis not present

## 2021-11-09 DIAGNOSIS — G4700 Insomnia, unspecified: Secondary | ICD-10-CM | POA: Diagnosis not present

## 2021-11-09 DIAGNOSIS — I10 Essential (primary) hypertension: Secondary | ICD-10-CM | POA: Diagnosis not present

## 2021-11-19 DIAGNOSIS — M25571 Pain in right ankle and joints of right foot: Secondary | ICD-10-CM | POA: Diagnosis not present

## 2021-11-26 ENCOUNTER — Other Ambulatory Visit: Payer: Self-pay | Admitting: Internal Medicine

## 2021-12-21 DIAGNOSIS — S62617A Displaced fracture of proximal phalanx of left little finger, initial encounter for closed fracture: Secondary | ICD-10-CM | POA: Diagnosis not present

## 2021-12-21 DIAGNOSIS — S62615A Displaced fracture of proximal phalanx of left ring finger, initial encounter for closed fracture: Secondary | ICD-10-CM | POA: Diagnosis not present

## 2021-12-21 DIAGNOSIS — M79642 Pain in left hand: Secondary | ICD-10-CM | POA: Diagnosis not present

## 2021-12-22 ENCOUNTER — Encounter (HOSPITAL_BASED_OUTPATIENT_CLINIC_OR_DEPARTMENT_OTHER): Payer: Self-pay | Admitting: Orthopedic Surgery

## 2021-12-22 DIAGNOSIS — M810 Age-related osteoporosis without current pathological fracture: Secondary | ICD-10-CM | POA: Diagnosis not present

## 2021-12-22 NOTE — Progress Notes (Addendum)
Spoke w/ via phone for pre-op interview--- pt Lab needs dos----   [employer] results------ current ekg in epic/ chart COVID test -----patient states asymptomatic no test needed Arrive at ------- 1145 on 12-29-2021 NPO after MN NO Solid Food.  Clear liquids from MN until--- 1045 Med rec completed Medications to take morning of surgery ----- gabapentin, protonix, if had done spiriva inhaler night before surgery then do morning Diabetic medication ----- n/a Patient instructed no nail polish to be worn day of surgery Patient instructed to bring photo id and insurance card day of surgery Patient aware to have Driver (ride ) / caregiver for 24 hours after surgery --- son, [name] Patient Special Instructions ----- asked to bring rescue inhaler  Pre-Op special Istructions ----- case just added on today,  pending pre-op orders Patient verbalized understanding of instructions that were given at this phone interview. Patient denies shortness of breath, chest pain, fever, cough at this phone interview.

## 2021-12-25 ENCOUNTER — Other Ambulatory Visit: Payer: Self-pay | Admitting: Internal Medicine

## 2021-12-25 NOTE — H&P (Signed)
Preoperative History & Physical Exam  Surgeon: Matt Holmes, MD  Diagnosis: Left ring and small finger proximal phalangeal fractures  Planned Procedure: Procedure(s) (LRB): Left ring and small finger proximal phalangeal closed reduction percutaneous pinning versus open reduction internal fixation (Left)  History of Present Illness:   Patient is a 71 y.o. female with symptoms consistent with Left ring and small finger proximal phalangeal fractures who presents for surgical intervention. The risks, benefits and alternatives of surgical intervention were discussed and informed consent was obtained prior to surgery.  Past Medical History:  Past Medical History:  Diagnosis Date   Active smoker    per pt quit 01/ 2021 smoking prior to lung lobectomy 02/ 2021 but started back smoking 06/ 2022 stated 5 cig per day   Asthma    Chronic diarrhea    Complication of anesthesia    hard to wake and ponv   Dyslipidemia    Emphysema/COPD ([place])    pulmologist--- dr Erin Fulling;   no oxygen   Endometriosis    Family history of adverse reaction to anesthesia    brother--- slow to wake   Finger fracture, left    left ring and small proximal phalagel fx's   GAD (generalized anxiety disorder)    GERD (gastroesophageal reflux disease)    Hiatal hernia    History of cervical dysplasia    s/p gyn crysurgery yrs ago   History of gastric ulcer 05/2019   post lung lobectomy w/ esophagitis   History of TIA (transient ischemic attack)    12-22-2021  per pt remote TIA > 10 yrs ago,  no residual   Hypertension    followed by cardiologist and pcp;   cardiac cath 11-20-2002 completely normal;   last nuclear stress test 08-14-2010 low risk normal perfusion no ischemia, nuclear ef 59%   Intermittent palpitations    followed by dr hilty   Lymphocytic colitis    followed by [facility]   Macular degeneration of both eyes    Migraine    Non-small cell lung cancer, right ([place]) 05/2019   surgeon-- dr Roxan Hockey /  oncologist-- dr Julien Nordmann  pulmologist-- dr j. Erin Fulling;   05-28-2019  s/p  right upper lobectomy w/ node dissection's,  Stage IA,  no chemo or radiation   NSVT (nonsustained ventricular tachycardia) ([place])    hx bigemy  and psot op wide complex tachycardia s/p lung lobectomy 02/ 2021   OSA (obstructive sleep apnea)    does not wear her CPAP because "it gave me respiratory issues (bronchities and pneumonia)"   Osteoporosis    PONV (postoperative nausea and vomiting)    Secondary polycythemia    evaluation by dr Willene Hatchet note in epic 11-03-2015  per note due to tobacco use and respiratory issues    Past Surgical History:  Past Surgical History:  Procedure Laterality Date   BIOPSY  06/05/2019   Procedure: BIOPSY;  Surgeon: Carol Ada, MD;  Location: [facility];  Service: Endoscopy;;   CARDIAC CATHETERIZATION  11/20/2002   @[facility]  by dr gamble;   completey normal;  patent coronary arteries   CATARACT EXTRACTION W/ INTRAOCULAR LENS IMPLANT Bilateral 08/2020   ESOPHAGOGASTRODUODENOSCOPY Left 06/05/2019   Procedure: ESOPHAGOGASTRODUODENOSCOPY (EGD);  Surgeon: Carol Ada, MD;  Location: [facility];  Service: Endoscopy;  Laterality: Left;   GYNECOLOGIC CRYOSURGERY     many yrs ago   INTERCOSTAL NERVE BLOCK Right 05/28/2019   Procedure: Intercostal Nerve Block;  Surgeon: Melrose Nakayama, MD;  Location: [facility];  Service: Thoracic;  Laterality: Right;   LAPAROSCOPIC CHOLECYSTECTOMY  10/15/2002   @[facility]    LUMBAR DISC SURGERY  08/15/2000   @[facility]  by dr Sherwood Gambler;   left L3--4   (and also lumbar surgery approx. 1989)   NASAL SINUS SURGERY  2004   approx   NODE DISSECTION  05/28/2019   Procedure: Node Dissection;  Surgeon: Melrose Nakayama, MD;  Location: [facility];  Service: Thoracic;;   ROTATOR CUFF REPAIR Left    x2  last one  1990s   THORACOSCOPY  05/28/2019   THORASCOPY-WEDGE RESECTION AND  RIGHT  UPPER LOBECTOMY (Right)   THROAT SURGERY  1997   REMOVAL OF TUMOR  (PER PT BENIGN)    TONSILLECTOMY     age 65   VAGINAL HYSTERECTOMY  2000   w/  BILATERAL SALPINGOOPHORECTOMY    Medications:  Prior to Admission medications   Medication Sig Start Date End Date Taking? Authorizing Provider  ALPRAZolam Duanne Moron) 0.5 MG tablet Take 0.5 mg by mouth 3 (three) times daily as needed for anxiety.   Yes [provider]  amLODipine (NORVASC) 2.5 MG tablet TAKE 1 TABLET(2.5 MG) BY MOUTH DAILY Patient taking differently: Take 2.5 mg by mouth at bedtime. 09/28/21  Yes Pixie Casino, MD  aspirin EC 81 MG tablet Take 81 mg by mouth at bedtime.   Yes [provider]  Calcium Citrate-Vitamin D (CALCIUM + D PO) Take 1 tablet by mouth 2 (two) times daily.   Yes [provider]  cholestyramine Lucrezia Starch) 4 GM/DOSE powder Take 4 g by mouth daily.   Yes [provider]  denosumab (PROLIA) 60 MG/ML SOLN injection Inject 60 mg into the skin every 6 (six) months. Administer in upper arm, thigh, or abdomen   Yes [provider]  Doxylamine Succinate, Sleep, (SLEEP-AID PO) Take by mouth at bedtime as needed.   Yes [provider]  ergocalciferol (VITAMIN D2) 1.25 MG (50000 UT) capsule Take 50,000 Units by mouth once a week. Saturday's   Yes [provider]  eszopiclone 3 MG TABS Take 3 mg by mouth at bedtime as needed. Take immediately before bedtime   Yes [provider]  gabapentin (NEURONTIN) 300 MG capsule TAKE 2 CAPSULES(600 MG) BY MOUTH THREE TIMES DAILY Patient taking differently: Take 600 mg by mouth 3 (three) times daily. TAKE 2 CAPSULES(600 MG) BY MOUTH THREE TIMES DAILY 02/17/21  Yes Melrose Nakayama, MD  loperamide (IMODIUM) 2 MG capsule Take by mouth as needed for diarrhea or loose stools.   Yes [provider]  metoprolol succinate (TOPROL-XL) 25 MG 24 hr tablet TAKE 1 TABLET(25 MG) BY MOUTH DAILY Patient taking differently: Take 25 mg by mouth at bedtime. 11/26/21  Yes Hilty, Nadean Corwin, MD  montelukast  (SINGULAIR) 10 MG tablet Take 10 mg by mouth at bedtime.  02/07/17  Yes [provider]  Multiple Vitamins-Minerals (MULTIVITAMIN ADULT EXTRA C) CHEW Chew by mouth at bedtime.   Yes [provider]  Multiple Vitamins-Minerals (PRESERVISION AREDS 2) CAPS Take 2 capsules by mouth at bedtime.   Yes [provider]  OVER THE COUNTER MEDICATION at bedtime. "Our Choice probiotic"   Yes [provider]  OVER THE COUNTER MEDICATION at bedtime as needed. Per pt CBD gummy   Yes [provider]  pantoprazole (PROTONIX) 40 MG tablet Take 1 tablet (40 mg total) by mouth 2 (two) times daily. Patient taking differently: Take 40 mg by mouth 2 (two) times daily. 06/13/19  Yes Barrett,  Erin R, PA-C  rosuvastatin (CRESTOR) 5 MG tablet TAKE 1 TABLET(5 MG) BY MOUTH DAILY Patient taking differently: Take 5 mg by mouth at bedtime. 12/31/20  Yes Hilty, Nadean Corwin, MD  SPIRIVA RESPIMAT 2.5 MCG/ACT AERS INHALE 2 PUFFS INTO THE LUNGS DAILY 03/05/21  Yes Freddi Starr, MD  telmisartan-hydrochlorothiazide (MICARDIS HCT) 80-12.5 MG tablet TAKE 1 TABLET BY MOUTH DAILY Patient taking differently: Take 1 tablet by mouth daily after lunch. 08/06/21  Yes Hilty, Nadean Corwin, MD  VENTOLIN HFA 108 (90 BASE) MCG/ACT inhaler Inhale 2 puffs into the lungs every 6 (six) hours as needed for wheezing or shortness of breath. 12/13/12  Yes [provider]  metoprolol tartrate (LOPRESSOR) 25 MG tablet Take 25 mg by mouth 2 (two) times daily. Patient not taking: Reported on 12/22/2021    [provider]    Allergies:  Iodinated contrast media, Ioxaglate, Omnipaque [iohexol], Budesonide, Buprenorphine hcl, Codeine, Erythromycin, and Morphine and related  Review of Systems: Negative except per HPI.  Physical Exam: Alert and oriented, NAD Head and neck: no masses, normal alignment CV: pulse intact Pulm: no increased work of breathing, respirations even and unlabored Abdomen:  non-distended Extremities: extremities warm and well perfused  LABS: No results found for this or any previous visit (from the past 2160 hour(s)).   Complete History and Physical exam available in the office notes  [name]

## 2021-12-29 ENCOUNTER — Encounter (HOSPITAL_BASED_OUTPATIENT_CLINIC_OR_DEPARTMENT_OTHER)
Admission: RE | Disposition: A | Discharge status: Home / Self Care | Payer: Self-pay | Source: Home / Self Care | Attending: Orthopedic Surgery

## 2021-12-29 ENCOUNTER — Ambulatory Visit (HOSPITAL_BASED_OUTPATIENT_CLINIC_OR_DEPARTMENT_OTHER): Payer: Medicare PPO | Admitting: Anesthesiology

## 2021-12-29 ENCOUNTER — Other Ambulatory Visit: Payer: Self-pay

## 2021-12-29 ENCOUNTER — Encounter (HOSPITAL_BASED_OUTPATIENT_CLINIC_OR_DEPARTMENT_OTHER): Payer: Self-pay | Admitting: Orthopedic Surgery

## 2021-12-29 ENCOUNTER — Ambulatory Visit (HOSPITAL_BASED_OUTPATIENT_CLINIC_OR_DEPARTMENT_OTHER)
Admission: RE | Admit: 2021-12-29 | Discharge: 2021-12-29 | Disposition: A | Discharge status: Home / Self Care | Payer: Medicare PPO | Attending: Orthopedic Surgery | Admitting: Orthopedic Surgery

## 2021-12-29 DIAGNOSIS — J449 Chronic obstructive pulmonary disease, unspecified: Secondary | ICD-10-CM | POA: Diagnosis not present

## 2021-12-29 DIAGNOSIS — Z87891 Personal history of nicotine dependence: Secondary | ICD-10-CM | POA: Diagnosis not present

## 2021-12-29 DIAGNOSIS — G4733 Obstructive sleep apnea (adult) (pediatric): Secondary | ICD-10-CM | POA: Diagnosis not present

## 2021-12-29 DIAGNOSIS — F419 Anxiety disorder, unspecified: Secondary | ICD-10-CM | POA: Insufficient documentation

## 2021-12-29 DIAGNOSIS — I10 Essential (primary) hypertension: Secondary | ICD-10-CM | POA: Insufficient documentation

## 2021-12-29 DIAGNOSIS — S62615A Displaced fracture of proximal phalanx of left ring finger, initial encounter for closed fracture: Secondary | ICD-10-CM | POA: Insufficient documentation

## 2021-12-29 DIAGNOSIS — S62617A Displaced fracture of proximal phalanx of left little finger, initial encounter for closed fracture: Secondary | ICD-10-CM

## 2021-12-29 DIAGNOSIS — K219 Gastro-esophageal reflux disease without esophagitis: Secondary | ICD-10-CM | POA: Diagnosis not present

## 2021-12-29 DIAGNOSIS — X58XXXA Exposure to other specified factors, initial encounter: Secondary | ICD-10-CM | POA: Diagnosis not present

## 2021-12-29 DIAGNOSIS — Z01818 Encounter for other preprocedural examination: Secondary | ICD-10-CM

## 2021-12-29 HISTORY — DX: Generalized anxiety disorder: F41.1

## 2021-12-29 HISTORY — DX: Other ventricular tachycardia: I47.29

## 2021-12-29 HISTORY — DX: Emphysema, unspecified: J43.9

## 2021-12-29 HISTORY — DX: Obstructive sleep apnea (adult) (pediatric): G47.33

## 2021-12-29 HISTORY — DX: Palpitations: R00.2

## 2021-12-29 HISTORY — DX: Gastro-esophageal reflux disease without esophagitis: K21.9

## 2021-12-29 HISTORY — DX: Age-related osteoporosis without current pathological fracture: M81.0

## 2021-12-29 HISTORY — DX: Lymphocytic colitis: K52.832

## 2021-12-29 HISTORY — DX: Personal history of transient ischemic attack (TIA), and cerebral infarction without residual deficits: Z86.73

## 2021-12-29 HISTORY — DX: Diaphragmatic hernia without obstruction or gangrene: K44.9

## 2021-12-29 HISTORY — DX: Unspecified asthma, uncomplicated: J45.909

## 2021-12-29 HISTORY — DX: Noninfective gastroenteritis and colitis, unspecified: K52.9

## 2021-12-29 HISTORY — DX: Fracture of unspecified phalanx of unspecified finger, initial encounter for closed fracture: S62.609A

## 2021-12-29 HISTORY — DX: Unspecified macular degeneration: H35.30

## 2021-12-29 HISTORY — DX: Secondary polycythemia: D75.1

## 2021-12-29 HISTORY — DX: Personal history of cervical dysplasia: Z87.410

## 2021-12-29 HISTORY — DX: Family history of other specified conditions: Z84.89

## 2021-12-29 HISTORY — PX: OPEN REDUCTION INTERNAL FIXATION (ORIF) PROXIMAL PHALANX: SHX6235

## 2021-12-29 LAB — POCT I-STAT, CHEM 8
BUN: <3 mg/dL — ABNORMAL LOW (ref 8–23)
Calcium, Ion: 1.15 mmol/L (ref 1.15–1.40)
Chloride: 85 mmol/L — ABNORMAL LOW (ref 98–111)
Creatinine, Ser: 0.5 mg/dL (ref 0.44–1.00)
Glucose, Bld: 82 mg/dL (ref 70–99)
HCT: 45 % (ref 36.0–46.0)
Hemoglobin: 15.3 g/dL — ABNORMAL HIGH (ref 12.0–15.0)
Potassium: 2.8 mmol/L — ABNORMAL LOW (ref 3.5–5.1)
Sodium: 133 mmol/L — ABNORMAL LOW (ref 135–145)
TCO2: 30 mmol/L (ref 22–32)

## 2021-12-29 SURGERY — OPEN REDUCTION INTERNAL FIXATION (ORIF) PROXIMAL PHALANX
Anesthesia: Regional | Site: Hand | Laterality: Left

## 2021-12-29 MED ORDER — FENTANYL CITRATE (PF) 100 MCG/2ML IJ SOLN
INTRAMUSCULAR | Status: AC
  Filled 2021-12-29: qty 2

## 2021-12-29 MED ORDER — OXYCODONE HCL 5 MG/5ML PO SOLN: 5.0000 mg | Freq: Once | ORAL | Status: DC | PRN

## 2021-12-29 MED ORDER — ONDANSETRON HCL 4 MG/2ML IJ SOLN
INTRAMUSCULAR | Status: DC | PRN
  Administered 2021-12-29: 4 mg via INTRAVENOUS

## 2021-12-29 MED ORDER — PROPOFOL 500 MG/50ML IV EMUL
INTRAVENOUS | Status: DC | PRN
  Administered 2021-12-29: 150 ug/kg/min via INTRAVENOUS

## 2021-12-29 MED ORDER — FENTANYL CITRATE (PF) 100 MCG/2ML IJ SOLN
INTRAMUSCULAR | Status: DC | PRN
Start: 2021-12-29 — End: 2021-12-29
  Administered 2021-12-29: 25 ug via INTRAVENOUS

## 2021-12-29 MED ORDER — ACETAMINOPHEN 160 MG/5ML PO SOLN: 325.0000 mg | ORAL | Status: DC | PRN

## 2021-12-29 MED ORDER — FENTANYL CITRATE (PF) 100 MCG/2ML IJ SOLN
50.0000 ug | Freq: Once | INTRAMUSCULAR | Status: AC
  Administered 2021-12-29: 50 ug via INTRAVENOUS

## 2021-12-29 MED ORDER — LIDOCAINE HCL (CARDIAC) PF 100 MG/5ML IV SOSY
PREFILLED_SYRINGE | INTRAVENOUS | Status: DC | PRN
  Administered 2021-12-29: 40 mg via INTRAVENOUS

## 2021-12-29 MED ORDER — MIDAZOLAM HCL 2 MG/2ML IJ SOLN
INTRAMUSCULAR | Status: AC
  Filled 2021-12-29: qty 2

## 2021-12-29 MED ORDER — AMISULPRIDE (ANTIEMETIC) 5 MG/2ML IV SOLN
10.0000 mg | Freq: Once | INTRAVENOUS | Status: DC | PRN
Start: 2021-12-29 — End: 2021-12-29

## 2021-12-29 MED ORDER — FENTANYL CITRATE (PF) 100 MCG/2ML IJ SOLN: 25.0000 ug | INTRAMUSCULAR | Status: DC | PRN

## 2021-12-29 MED ORDER — OXYCODONE HCL 5 MG PO TABS: 5.0000 mg | ORAL_TABLET | Freq: Once | ORAL | Status: DC | PRN

## 2021-12-29 MED ORDER — CEFAZOLIN SODIUM-DEXTROSE 2-4 GM/100ML-% IV SOLN
INTRAVENOUS | Status: AC
  Filled 2021-12-29: qty 100

## 2021-12-29 MED ORDER — LACTATED RINGERS IV SOLN: INTRAVENOUS | Status: DC

## 2021-12-29 MED ORDER — EPHEDRINE 5 MG/ML INJ
INTRAVENOUS | Status: AC
  Filled 2021-12-29: qty 5

## 2021-12-29 MED ORDER — MIDAZOLAM HCL 2 MG/2ML IJ SOLN
1.0000 mg | Freq: Once | INTRAMUSCULAR | Status: AC
  Administered 2021-12-29: 1 mg via INTRAVENOUS

## 2021-12-29 MED ORDER — EPHEDRINE SULFATE-NACL 50-0.9 MG/10ML-% IV SOSY
PREFILLED_SYRINGE | INTRAVENOUS | Status: DC | PRN
  Administered 2021-12-29 (×2): 10 mg via INTRAVENOUS

## 2021-12-29 MED ORDER — PROPOFOL 10 MG/ML IV BOLUS
INTRAVENOUS | Status: DC | PRN
  Administered 2021-12-29: 20 mg via INTRAVENOUS
  Administered 2021-12-29: 30 mg via INTRAVENOUS
  Administered 2021-12-29: 20 mg via INTRAVENOUS

## 2021-12-29 MED ORDER — ACETAMINOPHEN 10 MG/ML IV SOLN: 1000.0000 mg | Freq: Once | INTRAVENOUS | Status: DC | PRN

## 2021-12-29 MED ORDER — CEFAZOLIN SODIUM-DEXTROSE 2-4 GM/100ML-% IV SOLN
2.0000 g | INTRAVENOUS | Status: AC
  Administered 2021-12-29: 2 g via INTRAVENOUS

## 2021-12-29 MED ORDER — ACETAMINOPHEN 325 MG PO TABS: 325.0000 mg | ORAL_TABLET | ORAL | Status: DC | PRN

## 2021-12-29 MED ORDER — LIDOCAINE HCL (PF) 1 % IJ SOLN
INTRAMUSCULAR | Status: DC | PRN
  Administered 2021-12-29: 10 mL

## 2021-12-29 MED ORDER — LIDOCAINE HCL 1 % IJ SOLN
INTRAMUSCULAR | Status: AC
  Filled 2021-12-29: qty 20

## 2021-12-29 MED ORDER — 0.9 % SODIUM CHLORIDE (POUR BTL) OPTIME
TOPICAL | Status: DC | PRN
  Administered 2021-12-29: 500 mL

## 2021-12-29 MED ORDER — BUPIVACAINE-EPINEPHRINE (PF) 0.5% -1:200000 IJ SOLN
INTRAMUSCULAR | Status: DC | PRN
  Administered 2021-12-29: 20 mL via PERINEURAL

## 2021-12-29 MED ORDER — OXYCODONE-ACETAMINOPHEN 5-325 MG PO TABS: 1.0000 | ORAL_TABLET | Freq: Four times a day (QID) | ORAL | 0 refills | Status: AC | PRN

## 2021-12-29 SURGICAL SUPPLY — 34 items
BLADE SURG 15 STRL LF DISP TIS (BLADE) ×2 IMPLANT
BLADE SURG 15 STRL SS (BLADE) ×1
BNDG CMPR 9X4 STRL LF SNTH (GAUZE/BANDAGES/DRESSINGS) ×1
BNDG ELASTIC 4X5.8 VLCR STR LF (GAUZE/BANDAGES/DRESSINGS) ×2 IMPLANT
BNDG ESMARK 4X9 LF (GAUZE/BANDAGES/DRESSINGS) ×2 IMPLANT
CORD BIPOLAR FORCEPS 12FT (ELECTRODE) IMPLANT
COVER BACK TABLE 60X90IN (DRAPES) ×2 IMPLANT
CUFF TOURN SGL QUICK 18X4 (TOURNIQUET CUFF) ×2 IMPLANT
DECANTER SPIKE VIAL GLASS SM (MISCELLANEOUS) IMPLANT
DRAPE EXTREMITY T 121X128X90 (DISPOSABLE) ×2 IMPLANT
DRAPE OEC MINIVIEW 54X84 (DRAPES) ×2 IMPLANT
DRAPE SHEET LG 3/4 BI-LAMINATE (DRAPES) ×2 IMPLANT
DRAPE SURG 17X23 STRL (DRAPES) ×2 IMPLANT
DRSG EMULSION OIL 3X3 NADH (GAUZE/BANDAGES/DRESSINGS) ×2 IMPLANT
GAUZE 4X4 16PLY ~~LOC~~+RFID DBL (SPONGE) ×2 IMPLANT
GAUZE SPONGE 4X4 12PLY STRL (GAUZE/BANDAGES/DRESSINGS) ×2 IMPLANT
GLOVE BIOGEL PI IND STRL 7.5 (GLOVE) ×4 IMPLANT
GOWN STRL REUS W/TWL LRG LVL3 (GOWN DISPOSABLE) ×2 IMPLANT
K-WIRE .045X4 (WIRE) ×2
KWIRE .045X4 (WIRE) IMPLANT
NEEDLE HYPO 22GX1.5 SAFETY (NEEDLE) ×2 IMPLANT
NS IRRIG 1000ML POUR BTL (IV SOLUTION) ×2 IMPLANT
NS IRRIG 500ML POUR BTL (IV SOLUTION) ×2 IMPLANT
PACK BASIN DAY SURGERY FS (CUSTOM PROCEDURE TRAY) ×2 IMPLANT
PAD CAST 4YDX4 CTTN HI CHSV (CAST SUPPLIES) ×2 IMPLANT
PADDING CAST COTTON 4X4 STRL (CAST SUPPLIES) ×1
SUCTION FRAZIER HANDLE 10FR (MISCELLANEOUS)
SUCTION TUBE FRAZIER 10FR DISP (MISCELLANEOUS) IMPLANT
SUT ETHILON 4 0 PS 2 18 (SUTURE) ×2 IMPLANT
SYR 10ML LL (SYRINGE) ×2 IMPLANT
SYR BULB EAR ULCER 3OZ GRN STR (SYRINGE) ×2 IMPLANT
TOWEL OR 17X26 10 PK STRL BLUE (TOWEL DISPOSABLE) ×2 IMPLANT
TRAY DSU PREP LF (CUSTOM PROCEDURE TRAY) ×2 IMPLANT
UNDERPAD 30X36 HEAVY ABSORB (UNDERPADS AND DIAPERS) ×2 IMPLANT

## 2021-12-29 NOTE — Transfer of Care (Signed)
Immediate Anesthesia Transfer of Care Note  Patient: [name]  Procedure(s) Performed: Procedure(s) (LRB): Left ring and small finger proximal phalangeal closed reduction percutaneous pinning (Left)  Patient Location: PACU  Anesthesia Type: MAC  Level of Consciousness: awake, alert  and oriented  Airway & Oxygen Therapy: Patient Spontanous Breathing   Post-op Assessment: Report given to PACU RN and Post -op Vital signs reviewed and stable  Post vital signs: Reviewed and stable  Complications: No apparent anesthesia complications Last Vitals:  Vitals Value Taken Time  BP 123/77 12/29/21 1315  Temp 36.4 C 12/29/21 1315  Pulse 72 12/29/21 1320  Resp 15 12/29/21 1320  SpO2 91 % 12/29/21 1320  Vitals shown include unvalidated device data.  Last Pain:  Vitals:   12/29/21 1315  TempSrc:   PainSc: 0-No pain      Patients Stated Pain Goal: 5 (25/52/58 9483)  Complications: No notable events documented.

## 2021-12-29 NOTE — Interval H&P Note (Signed)
History and Physical Interval Note:  12/29/2021 12:14 PM  [name]  has presented today for surgery, with the diagnosis of Left ring and small finger proximal phalangeal fractures.  The various methods of treatment have been discussed with the patient and family. After consideration of risks, benefits and other options for treatment, the patient has consented to  Procedure(s): Left ring and small finger proximal phalangeal closed reduction percutaneous pinning versus open reduction internal fixation (Left) as a surgical intervention.  The patient's history has been reviewed, patient examined, no change in status, stable for surgery.  I have reviewed the patient's chart and labs.  Questions were answered to the patient's satisfaction.     Orene Desanctis

## 2021-12-29 NOTE — Anesthesia Postprocedure Evaluation (Signed)
Anesthesia Post Note  Patient: CHELSE MATAS  Procedure(s) Performed: Left ring and small finger proximal phalangeal closed reduction percutaneous pinning (Left: Hand)     Patient location during evaluation: PACU Anesthesia Type: Regional Level of consciousness: awake and alert Pain management: pain level controlled Vital Signs Assessment: post-procedure vital signs reviewed and stable Respiratory status: spontaneous breathing, nonlabored ventilation, respiratory function stable and patient connected to nasal cannula oxygen Cardiovascular status: stable and blood pressure returned to baseline Postop Assessment: no apparent nausea or vomiting Anesthetic complications: no   No notable events documented.  Last Vitals:  Vitals:   12/29/21 1353 12/29/21 1421  BP:  129/82  Pulse: 66 68  Resp: 13 17  Temp:  (!) 36.3 C  SpO2: 91% 93%    Last Pain:  Vitals:   12/29/21 1421  TempSrc:   PainSc: 0-No pain                 Effie Berkshire

## 2021-12-29 NOTE — Anesthesia Procedure Notes (Signed)
Procedure Name: MAC Date/Time: 12/29/2021 12:27 PM  Performed by: Mechele Claude, CRNAPre-anesthesia Checklist: Patient identified, Emergency Drugs available, Suction available and Patient being monitored Oxygen Delivery Method: Simple face mask Placement Confirmation: positive ETCO2, CO2 detector and breath sounds checked- equal and bilateral

## 2021-12-29 NOTE — Discharge Instructions (Addendum)
  Orthopaedic Hand Surgery Discharge Instructions  WEIGHT BEARING STATUS: Non weight bearing on operative extremity  DRESSING CARE: Please keep your dressing/splint/cast clean and dry until your follow-up appointment. You may shower by placing a waterproof covering over your dressing/splint/cast. Contact your surgeon if your splint/cast gets wet. It will need to be changed to prevent skin breakdown.  PAIN CONTROL: First line medications for post operative pain control are Tylenol (acetaminophen) and Motrin (ibuprofen) if you are able to take these medications. If you have been prescribed a medication these can be taken as breakthrough pain medications. Please note that some narcotic pain medication has acetaminophen added and you should never consume more than 4,000mg  of acetaminophen in 24-hour period. Please note that if you are given Toradol (ketorolac) you should not take similar medications such as ibuprofen or naproxen.  DISCHARGE MEDICATIONS: If you have been prescribed medication it was sent electronically to your pharmacy. No changes have been made to your home medications.  ICE/ELEVATION: Ice and elevate your injured extremity as needed. Avoid direct contact of ice with skin.   BANDAGE FEELS TOO TIGHT: If your bandage feels too tight, first make sure you are elevating your fingers as much as possible. The outer layer of the bandage can be unwrapped and reapplied more loosely. If no improvement, you may carefully cut the inner layer longitudinally until the pressure has resolved and then rewrap the outer layer. If you are not comfortable with these instructions, please call the office and the bandage can be changed for you.   FOLLOW UP: You will be called after surgery with an appointment date and time, however if you have not received a phone call within 3 days, please call during regular office hours at [phone] to schedule a post operative appointment.  Please Seek Medical Attention  if: Call MD for: pain or pressure in chest, jaw, arm, back, neck  Call MD for: temperature greater than 101 F for more than 24 hrs Call MD for: difficulty breathing Call MD for: incision redness, bleeding, drainage  Call MD for: palpitations or feeling that the heart is racing  Call MD for: increased swelling in arm, leg, ankle, or abdomen  Call MD for: lightheadedness, dizziness, fainting Call 911 or go to ER for any medical emergency if you are not able to get in touch with your doctor   J. Sable Feil, MD Orthopaedic Hand Surgeon [facility] Office number: [phone] [address] [place], [place] [address]  Post Anesthesia Home Care Instructions  Activity: Get plenty of rest for the remainder of the day. A responsible individual must stay with you for 24 hours following the procedure.  For the next 24 hours, DO NOT: -Drive a car -Paediatric nurse -Drink alcoholic beverages -Take any medication unless instructed by your physician -Make any legal decisions or sign important papers.  Meals: Start with liquid foods such as gelatin or soup. Progress to regular foods as tolerated. Avoid greasy, spicy, heavy foods. If nausea and/or vomiting occur, drink only clear liquids until the nausea and/or vomiting subsides. Call your physician if vomiting continues.  Special Instructions/Symptoms: Your throat may feel dry or sore from the anesthesia or the breathing tube placed in your throat during surgery. If this causes discomfort, gargle with warm salt water. The discomfort should disappear within 24 hours.

## 2021-12-29 NOTE — Anesthesia Preprocedure Evaluation (Addendum)
Anesthesia Evaluation  Patient identified by MRN, date of birth, ID band Patient awake    Reviewed: Allergy & Precautions, NPO status , Patient's Chart, lab work & pertinent test results  History of Anesthesia Complications (+) PONV and history of anesthetic complications  Airway Mallampati: I  TM Distance: >3 FB Neck ROM: Full    Dental  (+) Teeth Intact, Dental Advisory Given   Pulmonary asthma , sleep apnea , COPD, former smoker,    breath sounds clear to auscultation       Cardiovascular hypertension,  Rhythm:Regular Rate:Normal     Neuro/Psych  Headaches, PSYCHIATRIC DISORDERS Anxiety    GI/Hepatic Neg liver ROS, hiatal hernia, GERD  ,  Endo/Other  negative endocrine ROS  Renal/GU negative Renal ROS     Musculoskeletal negative musculoskeletal ROS (+)   Abdominal   Peds  Hematology negative hematology ROS (+)   Anesthesia Other Findings   Reproductive/Obstetrics                            Anesthesia Physical Anesthesia Plan  ASA: 3  Anesthesia Plan: Regional   Post-op Pain Management:    Induction: Intravenous  PONV Risk Score and Plan: 3 and Ondansetron, Dexamethasone, Midazolam and Propofol infusion  Airway Management Planned: Natural Airway and Simple Face Mask  Additional Equipment: None  Intra-op Plan:   Post-operative Plan:   Informed Consent: I have reviewed the patients History and Physical, chart, labs and discussed the procedure including the risks, benefits and alternatives for the proposed anesthesia with the patient or authorized representative who has indicated his/her understanding and acceptance.     Dental advisory given  Plan Discussed with: CRNA  Anesthesia Plan Comments:        Anesthesia Quick Evaluation

## 2021-12-29 NOTE — Anesthesia Procedure Notes (Signed)
Anesthesia Regional Block: Axillary brachial plexus block   Pre-Anesthetic Checklist: , timeout performed,  Correct Patient, Correct Site, Correct Laterality,  Correct Procedure, Correct Position, site marked,  Risks and benefits discussed,  Surgical consent,  Pre-op evaluation,  At surgeon's request and post-op pain management  Laterality: Left  Prep: chloraprep       Needles:  Injection technique: Single-shot  Needle Type: Echogenic Stimulator Needle     Needle Length: 9cm  Needle Gauge: 21     Additional Needles:   Procedures:,,,, ultrasound used (permanent image in chart),,    Narrative:  Start time: 12/29/2021 11:55 AM End time: 12/29/2021 12:00 PM Injection made incrementally with aspirations every 5 mL.  Performed by: Personally  Anesthesiologist: Effie Berkshire, MD  Additional Notes: Patient tolerated the procedure well. Local anesthetic introduced in an incremental fashion under minimal resistance after negative aspirations. No paresthesias were elicited. After completion of the procedure, no acute issues were identified and patient continued to be monitored by RN.

## 2021-12-29 NOTE — Op Note (Signed)
OPERATIVE NOTE  DATE OF PROCEDURE: 12/29/2021  SURGEONS:  Primary: Orene Desanctis, MD  PREOPERATIVE DIAGNOSIS: Left ring and small finger proximal phalangeal fractures  POSTOPERATIVE DIAGNOSIS: Same  NAME OF PROCEDURE:   Left ring and small fingers proximal phalanx fractures closed reduction and percutaneous pinning Four view radiographs of the left ring and left small fingers with intraoperative interpretation  ANESTHESIA: Regional  SKIN PREPARATION: Hibiclens  ESTIMATED BLOOD LOSS: Minimal  IMPLANTS: 0.045 k wires x 2  INDICATIONS:  [name] is a 71 y.o. female who has the above preoperative diagnosis. The patient has decided to proceed with surgical intervention.  Risks, benefits and alternatives of operative management were discussed including, but not limited to, risks of anesthesia complications, infection, pain, persistent symptoms, stiffness, need for future surgery.  The patient understands, agrees and elects to proceed with surgery.    DESCRIPTION OF PROCEDURE: The patient was met in the pre-operative area and their identity was verified.  The operative location and laterality was also verified and marked.  The patient was brought to the OR and was placed supine on the table.  After repeat patient identification with the operative team anesthesia was provided and the patient was prepped and draped in the usual sterile fashion.  A final timeout was performed verifying the correction patient, procedure, location and laterality.  The left upper extremity was elevated exsanguinated with an Esmarch and tourniquet inflated to 250 mmHg.  C-arm was utilized to identify the fractures at the base of the proximal phalanges of the ring and small fingers.  There was significant displacement to the small finger and mild rotational deformity to the ring finger.  A closed reduction was performed and percutaneous K wire fixation via an antegrade transarticular technique was utilized.  There was  excellent stability at the fracture site and the finger was then approximately 80 degrees of flexion at the MCP joint.  The rotational deformity was corrected to both the ring and small fingers.  4 view radiographs of the left ring and left small fingers were both performed with intraoperative interpretation confirming adequate length alignment rotation of the proximal phalanx fracture of the left ring finger and the left small finger with improved alignment as well as placement of the hardware.  The wires were bent and cut outside of the skin.  The finger was brought through full range of motion at the PIP and DIP joints without any tethering of the extensor apparatus.  A sterile soft bandage followed by an ulnar gutter plaster splint was applied.  Counts were correct x2.  The tourniquet was deflated and the fingers were pink and warm and well-perfused.  The patient was awoken from anesthesia and brought to PACU for recovery in stable condition.  Postoperative plan: Follow-up in 1 week with hand therapy for bandage change and custom molded orthosis ulnar gutter with the ring and small fingers at 90 degrees and build the custom splint around the pins.  Plan for 3 weeks in office pin removal followed by an active range of motion protocol.  Strict nonweightbearing.  Elevate is much as possible.  Okay to range the PIP and DIP joints postoperatively.  Follow-up with me in 10 days in the office for an x-ray of the left ring and left small fingers.   Matt Holmes, MD

## 2021-12-29 NOTE — Progress Notes (Signed)
Assisted Dr. Smith Robert with left, axillary block. Side rails up, monitors on throughout procedure. See vital signs in flow sheet. Tolerated Procedure well.

## 2021-12-30 ENCOUNTER — Encounter (HOSPITAL_BASED_OUTPATIENT_CLINIC_OR_DEPARTMENT_OTHER): Payer: Self-pay | Admitting: Orthopedic Surgery

## 2022-01-04 DIAGNOSIS — M25642 Stiffness of left hand, not elsewhere classified: Secondary | ICD-10-CM | POA: Diagnosis not present

## 2022-01-04 DIAGNOSIS — Z4789 Encounter for other orthopedic aftercare: Secondary | ICD-10-CM | POA: Diagnosis not present

## 2022-01-06 ENCOUNTER — Telehealth: Payer: Self-pay | Admitting: Internal Medicine

## 2022-01-06 NOTE — Telephone Encounter (Signed)
Called patient regarding upcoming October appointments, patient is notified.  

## 2022-01-25 ENCOUNTER — Other Ambulatory Visit: Payer: Medicare PPO

## 2022-01-25 ENCOUNTER — Ambulatory Visit (HOSPITAL_COMMUNITY)
Admission: RE | Admit: 2022-01-25 | Discharge: 2022-01-25 | Disposition: A | Discharge status: Home / Self Care | Payer: Medicare PPO | Source: Ambulatory Visit | Attending: Internal Medicine | Admitting: Internal Medicine

## 2022-01-25 ENCOUNTER — Inpatient Hospital Stay: Payer: Medicare PPO | Attending: Internal Medicine

## 2022-01-25 DIAGNOSIS — J432 Centrilobular emphysema: Secondary | ICD-10-CM | POA: Diagnosis not present

## 2022-01-25 DIAGNOSIS — R197 Diarrhea, unspecified: Secondary | ICD-10-CM | POA: Diagnosis not present

## 2022-01-25 DIAGNOSIS — M79642 Pain in left hand: Secondary | ICD-10-CM | POA: Diagnosis not present

## 2022-01-25 DIAGNOSIS — I10 Essential (primary) hypertension: Secondary | ICD-10-CM | POA: Diagnosis not present

## 2022-01-25 DIAGNOSIS — J439 Emphysema, unspecified: Secondary | ICD-10-CM | POA: Diagnosis not present

## 2022-01-25 DIAGNOSIS — Z8711 Personal history of peptic ulcer disease: Secondary | ICD-10-CM | POA: Insufficient documentation

## 2022-01-25 DIAGNOSIS — E785 Hyperlipidemia, unspecified: Secondary | ICD-10-CM | POA: Insufficient documentation

## 2022-01-25 DIAGNOSIS — Z7982 Long term (current) use of aspirin: Secondary | ICD-10-CM | POA: Diagnosis not present

## 2022-01-25 DIAGNOSIS — Z79899 Other long term (current) drug therapy: Secondary | ICD-10-CM | POA: Diagnosis not present

## 2022-01-25 DIAGNOSIS — K219 Gastro-esophageal reflux disease without esophagitis: Secondary | ICD-10-CM | POA: Insufficient documentation

## 2022-01-25 DIAGNOSIS — Z4789 Encounter for other orthopedic aftercare: Secondary | ICD-10-CM | POA: Diagnosis not present

## 2022-01-25 DIAGNOSIS — F411 Generalized anxiety disorder: Secondary | ICD-10-CM | POA: Insufficient documentation

## 2022-01-25 DIAGNOSIS — C3490 Malignant neoplasm of unspecified part of unspecified bronchus or lung: Secondary | ICD-10-CM | POA: Diagnosis not present

## 2022-01-25 DIAGNOSIS — I700 Atherosclerosis of aorta: Secondary | ICD-10-CM | POA: Diagnosis not present

## 2022-01-25 DIAGNOSIS — R911 Solitary pulmonary nodule: Secondary | ICD-10-CM | POA: Diagnosis not present

## 2022-01-25 DIAGNOSIS — Z8673 Personal history of transient ischemic attack (TIA), and cerebral infarction without residual deficits: Secondary | ICD-10-CM | POA: Insufficient documentation

## 2022-01-25 DIAGNOSIS — C3411 Malignant neoplasm of upper lobe, right bronchus or lung: Secondary | ICD-10-CM | POA: Diagnosis not present

## 2022-01-25 DIAGNOSIS — I2510 Atherosclerotic heart disease of native coronary artery without angina pectoris: Secondary | ICD-10-CM | POA: Insufficient documentation

## 2022-01-25 DIAGNOSIS — J9811 Atelectasis: Secondary | ICD-10-CM | POA: Diagnosis not present

## 2022-01-25 LAB — CMP (CANCER CENTER ONLY)
ALT: 18 U/L (ref 0–44)
AST: 21 U/L (ref 15–41)
Albumin: 4.2 g/dL (ref 3.5–5.0)
Alkaline Phosphatase: 63 U/L (ref 38–126)
Anion gap: 9 (ref 5–15)
BUN: 7 mg/dL — ABNORMAL LOW (ref 8–23)
CO2: 33 mmol/L — ABNORMAL HIGH (ref 22–32)
Calcium: 9.4 mg/dL (ref 8.9–10.3)
Chloride: 88 mmol/L — ABNORMAL LOW (ref 98–111)
Creatinine: 0.6 mg/dL (ref 0.44–1.00)
GFR, Estimated: >60 mL/min (ref >60)
Glucose, Bld: 109 mg/dL — ABNORMAL HIGH (ref 70–99)
Potassium: 3.5 mmol/L (ref 3.5–5.1)
Sodium: 130 mmol/L — ABNORMAL LOW (ref 135–145)
Total Bilirubin: 0.7 mg/dL (ref 0.3–1.2)
Total Protein: 6.5 g/dL (ref 6.5–8.1)

## 2022-01-25 LAB — CBC WITH DIFFERENTIAL (CANCER CENTER ONLY)
Abs Immature Granulocytes: 0.04 10*3/uL (ref 0.00–0.07)
Basophils Absolute: 0.1 10*3/uL (ref 0.0–0.1)
Basophils Relative: 1 %
Eosinophils Absolute: 0.1 10*3/uL (ref 0.0–0.5)
Eosinophils Relative: 1 %
HCT: 42.7 % (ref 36.0–46.0)
Hemoglobin: 15.2 g/dL — ABNORMAL HIGH (ref 12.0–15.0)
Immature Granulocytes: 0 %
Lymphocytes Relative: 25 %
Lymphs Abs: 2.8 10*3/uL (ref 0.7–4.0)
MCH: 34.2 pg — ABNORMAL HIGH (ref 26.0–34.0)
MCHC: 35.6 g/dL (ref 30.0–36.0)
MCV: 96 fL (ref 80.0–100.0)
Monocytes Absolute: 0.8 10*3/uL (ref 0.1–1.0)
Monocytes Relative: 7 %
Neutro Abs: 7.4 10*3/uL (ref 1.7–7.7)
Neutrophils Relative %: 66 %
Platelet Count: 265 10*3/uL (ref 150–400)
RBC: 4.45 MIL/uL (ref 3.87–5.11)
RDW: 12.7 % (ref 11.5–15.5)
WBC Count: 11.1 10*3/uL — ABNORMAL HIGH (ref 4.0–10.5)
nRBC: 0 % (ref 0.0–0.2)

## 2022-01-26 ENCOUNTER — Ambulatory Visit: Payer: Medicare PPO | Admitting: Internal Medicine

## 2022-01-27 ENCOUNTER — Other Ambulatory Visit: Payer: Self-pay

## 2022-01-27 ENCOUNTER — Inpatient Hospital Stay: Payer: Medicare PPO | Admitting: Internal Medicine

## 2022-01-27 VITALS — BP 129/94 | HR 81 | Temp 98.0°F | Resp 18 | Ht 64.0 in | Wt 119.4 lb

## 2022-01-27 DIAGNOSIS — K219 Gastro-esophageal reflux disease without esophagitis: Secondary | ICD-10-CM | POA: Diagnosis not present

## 2022-01-27 DIAGNOSIS — J432 Centrilobular emphysema: Secondary | ICD-10-CM | POA: Diagnosis not present

## 2022-01-27 DIAGNOSIS — C3411 Malignant neoplasm of upper lobe, right bronchus or lung: Secondary | ICD-10-CM | POA: Diagnosis not present

## 2022-01-27 DIAGNOSIS — J439 Emphysema, unspecified: Secondary | ICD-10-CM | POA: Diagnosis not present

## 2022-01-27 DIAGNOSIS — C3490 Malignant neoplasm of unspecified part of unspecified bronchus or lung: Secondary | ICD-10-CM | POA: Diagnosis not present

## 2022-01-27 DIAGNOSIS — Z8711 Personal history of peptic ulcer disease: Secondary | ICD-10-CM | POA: Diagnosis not present

## 2022-01-27 DIAGNOSIS — I10 Essential (primary) hypertension: Secondary | ICD-10-CM | POA: Diagnosis not present

## 2022-01-27 DIAGNOSIS — F411 Generalized anxiety disorder: Secondary | ICD-10-CM | POA: Diagnosis not present

## 2022-01-27 DIAGNOSIS — R197 Diarrhea, unspecified: Secondary | ICD-10-CM | POA: Diagnosis not present

## 2022-01-27 DIAGNOSIS — E785 Hyperlipidemia, unspecified: Secondary | ICD-10-CM | POA: Diagnosis not present

## 2022-01-27 NOTE — Progress Notes (Signed)
[name]:([phone]   [phone]  OFFICE PROGRESS NOTE  [name], [place] [address]  DIAGNOSIS: Stage IA (T1b, N0, M0) non-small cell lung cancer, adenocarcinoma presented with right upper lobe nodule   PRIOR THERAPY: Status post right upper lobectomy with lymph node dissection under the care of Dr. Roxan Hockey on May 28, 2019.   CURRENT THERAPY: Observation  INTERVAL HISTORY: [name] 71 y.o. female returns to the clinic today for follow-up visit.  The patient is feeling fine today with no concerning complaints.  She denied having any current chest pain, shortness of breath except with exertion with no cough or hemoptysis.  She has no nausea, vomiting, diarrhea or constipation.  She has no headache or visual changes.  She denied having any weight loss or night sweats.  The patient has CT scan of the chest performed recently and she is here for evaluation and discussion of her scan results.  MEDICAL HISTORY: Past Medical History:  Diagnosis Date   Active smoker    per pt quit 01/ 2021 smoking prior to lung lobectomy 02/ 2021 but started back smoking 06/ 2022 stated 5 cig per day   Asthma    Chronic diarrhea    Complication of anesthesia    hard to wake and ponv   Dyslipidemia    Emphysema/COPD ([place])    pulmologist--- dr Erin Fulling;   no oxygen   Endometriosis    Family history of adverse reaction to anesthesia    brother--- slow to wake   Finger fracture, left    left ring and small proximal phalagel fx's   GAD (generalized anxiety disorder)    GERD (gastroesophageal reflux disease)    Hiatal hernia    History of cervical dysplasia    s/p gyn crysurgery yrs ago   History of gastric ulcer 05/2019   post lung lobectomy w/ esophagitis   History of TIA (transient ischemic attack)    12-22-2021  per pt remote TIA > 10 yrs ago,  no residual   Hypertension    followed by  cardiologist and pcp;   cardiac cath 11-20-2002 completely normal;   last nuclear stress test 08-14-2010 low risk normal perfusion no ischemia, nuclear ef 59%   Intermittent palpitations    followed by dr hilty   Lymphocytic colitis    followed by [facility]   Macular degeneration of both eyes    Migraine    Non-small cell lung cancer, right ([place]) 05/2019   surgeon-- dr Roxan Hockey / oncologist-- dr Julien Nordmann  pulmologist-- dr j. Erin Fulling;   05-28-2019  s/p  right upper lobectomy w/ node dissection's,  Stage IA,  no chemo or radiation   NSVT (nonsustained ventricular tachycardia) ([place])    hx bigemy  and psot op wide complex tachycardia s/p lung lobectomy 02/ 2021   OSA (obstructive sleep apnea)    does not wear her CPAP because "it gave me respiratory issues (bronchities and pneumonia)"   Osteoporosis    PONV (postoperative nausea and vomiting)    Secondary polycythemia    evaluation by dr Willene Hatchet note in epic 11-03-2015  per note due to tobacco use and respiratory issues    ALLERGIES:  is allergic to iodinated contrast media, ioxaglate, omnipaque [iohexol], budesonide, buprenorphine hcl, codeine, erythromycin, and morphine and related.  MEDICATIONS:  Current Outpatient Medications  Medication Sig Dispense Refill   ALPRAZolam (XANAX) 0.5 MG tablet Take 0.5 mg  by mouth 3 (three) times daily as needed for anxiety.     amLODipine (NORVASC) 2.5 MG tablet TAKE 1 TABLET(2.5 MG) BY MOUTH DAILY (Patient taking differently: Take 2.5 mg by mouth at bedtime.) 90 tablet 1   aspirin EC 81 MG tablet Take 81 mg by mouth at bedtime.     Calcium Citrate-Vitamin D (CALCIUM + D PO) Take 1 tablet by mouth 2 (two) times daily.     cholestyramine (QUESTRAN) 4 GM/DOSE powder Take 4 g by mouth daily.     denosumab (PROLIA) 60 MG/ML SOLN injection Inject 60 mg into the skin every 6 (six) months. Administer in upper arm, thigh, or abdomen     Doxylamine Succinate, Sleep, (SLEEP-AID PO) Take by mouth at bedtime as  needed.     ergocalciferol (VITAMIN D2) 1.25 MG (50000 UT) capsule Take 50,000 Units by mouth once a week. Saturday's     eszopiclone 3 MG TABS Take 3 mg by mouth at bedtime as needed. Take immediately before bedtime     gabapentin (NEURONTIN) 300 MG capsule TAKE 2 CAPSULES(600 MG) BY MOUTH THREE TIMES DAILY (Patient taking differently: Take 600 mg by mouth 3 (three) times daily. TAKE 2 CAPSULES(600 MG) BY MOUTH THREE TIMES DAILY) 180 capsule 2   loperamide (IMODIUM) 2 MG capsule Take by mouth as needed for diarrhea or loose stools.     metoprolol succinate (TOPROL-XL) 25 MG 24 hr tablet TAKE 1 TABLET(25 MG) BY MOUTH DAILY (Patient taking differently: Take 25 mg by mouth at bedtime.) 90 tablet 1   metoprolol tartrate (LOPRESSOR) 25 MG tablet Take 25 mg by mouth 2 (two) times daily. (Patient not taking: Reported on 12/22/2021)     montelukast (SINGULAIR) 10 MG tablet Take 10 mg by mouth at bedtime.   0   Multiple Vitamins-Minerals (MULTIVITAMIN ADULT EXTRA C) CHEW Chew by mouth at bedtime.     Multiple Vitamins-Minerals (PRESERVISION AREDS 2) CAPS Take 2 capsules by mouth at bedtime.     OVER THE COUNTER MEDICATION at bedtime. "Our Choice probiotic"     OVER THE COUNTER MEDICATION at bedtime as needed. Per pt CBD gummy     pantoprazole (PROTONIX) 40 MG tablet Take 1 tablet (40 mg total) by mouth 2 (two) times daily. (Patient taking differently: Take 40 mg by mouth 2 (two) times daily.) 60 tablet 3   rosuvastatin (CRESTOR) 5 MG tablet TAKE 1 TABLET(5 MG) BY MOUTH DAILY 90 tablet 3   SPIRIVA RESPIMAT 2.5 MCG/ACT AERS INHALE 2 PUFFS INTO THE LUNGS DAILY 4 g 0   telmisartan-hydrochlorothiazide (MICARDIS HCT) 80-12.5 MG tablet TAKE 1 TABLET BY MOUTH DAILY (Patient taking differently: Take 1 tablet by mouth daily after lunch.) 90 tablet 3   VENTOLIN HFA 108 (90 BASE) MCG/ACT inhaler Inhale 2 puffs into the lungs every 6 (six) hours as needed for wheezing or shortness of breath.     No current  facility-administered medications for this visit.    SURGICAL HISTORY:  Past Surgical History:  Procedure Laterality Date   BIOPSY  06/05/2019   Procedure: BIOPSY;  Surgeon: Carol Ada, MD;  Location: [facility];  Service: Endoscopy;;   CARDIAC CATHETERIZATION  11/20/2002   @[facility]  by dr gamble;   completey normal;  patent coronary arteries   CATARACT EXTRACTION W/ INTRAOCULAR LENS IMPLANT Bilateral 08/2020   ESOPHAGOGASTRODUODENOSCOPY Left 06/05/2019   Procedure: ESOPHAGOGASTRODUODENOSCOPY (EGD);  Surgeon: Carol Ada, MD;  Location: [facility];  Service: Endoscopy;  Laterality: Left;   GYNECOLOGIC CRYOSURGERY  many yrs ago   [place] Right 05/28/2019   Procedure: Intercostal Nerve Block;  Surgeon: Melrose Nakayama, MD;  Location: [facility];  Service: Thoracic;  Laterality: Right;   LAPAROSCOPIC CHOLECYSTECTOMY  10/15/2002   @[facility]    LUMBAR [place] SURGERY  08/15/2000   @[facility]  by dr Sherwood Gambler;   left L3--4   (and also lumbar surgery approx. 1989)   NASAL SINUS SURGERY  2004   approx   NODE DISSECTION  05/28/2019   Procedure: Node Dissection;  Surgeon: Melrose Nakayama, MD;  Location: [facility];  Service: Thoracic;;   OPEN REDUCTION INTERNAL FIXATION (ORIF) PROXIMAL PHALANX Left 12/29/2021   Procedure: Left ring and small finger proximal phalangeal closed reduction percutaneous pinning;  Surgeon: Orene Desanctis, MD;  Location: [facility];  Service: Orthopedics;  Laterality: Left;   ROTATOR CUFF REPAIR Left    x2  last one  1990s   THORACOSCOPY  05/28/2019   THORASCOPY-WEDGE RESECTION AND  RIGHT  UPPER LOBECTOMY (Right)   THROAT SURGERY  1997   REMOVAL OF TUMOR  (PER PT BENIGN)   TONSILLECTOMY     age 18   VAGINAL HYSTERECTOMY  2000   w/  BILATERAL SALPINGOOPHORECTOMY    REVIEW OF SYSTEMS:  A comprehensive review of systems was negative except for: Constitutional: positive for fatigue Neurological: positive for paresthesia   PHYSICAL EXAMINATION:  General appearance: alert, cooperative, and no distress Head: Normocephalic, without obvious abnormality, atraumatic Neck: no adenopathy, no JVD, supple, symmetrical, trachea midline, and thyroid not enlarged, symmetric, no tenderness/mass/nodules Lymph nodes: Cervical, supraclavicular, and axillary nodes normal. Resp: clear to auscultation bilaterally Back: symmetric, no curvature. ROM normal. No CVA tenderness. Cardio: regular rate and rhythm, S1, S2 normal, no murmur, click, rub or gallop GI: soft, non-tender; bowel sounds normal; no masses,  no organomegaly Extremities: extremities normal, atraumatic, no cyanosis or edema  ECOG PERFORMANCE STATUS: 1 - Symptomatic but completely ambulatory  Blood pressure (!) 129/94, pulse 81, temperature 98 F (36.7 C), temperature source Oral, resp. rate 18, height 5\' 4"  (1.626 m), weight 119 lb 6 oz (54.1 kg), SpO2 94 %.  LABORATORY DATA: Lab Results  Component Value Date   WBC 11.1 (H) 01/25/2022   HGB 15.2 (H) 01/25/2022   HCT 42.7 01/25/2022   MCV 96.0 01/25/2022   PLT 265 01/25/2022      Chemistry      Component Value Date/Time   NA 130 (L) 01/25/2022 1438   K 3.5 01/25/2022 1438   CL 88 (L) 01/25/2022 1438   CO2 33 (H) 01/25/2022 1438   BUN 7 (L) 01/25/2022 1438   CREATININE 0.60 01/25/2022 1438      Component Value Date/Time   CALCIUM 9.4 01/25/2022 1438   ALKPHOS 63 01/25/2022 1438   AST 21 01/25/2022 1438   ALT 18 01/25/2022 1438   BILITOT 0.7 01/25/2022 1438       RADIOGRAPHIC STUDIES: CT Chest Wo Contrast  Result Date: 01/27/2022 CLINICAL DATA:  71 year old female with history of non-small cell lung cancer. Staging examination. * Tracking Code: BO * EXAM: CT CHEST WITHOUT CONTRAST TECHNIQUE: Multidetector CT imaging of the chest was performed following the standard protocol without IV contrast. RADIATION DOSE REDUCTION: This exam was performed according to the departmental dose-optimization program which includes  automated exposure control, adjustment of the mA and/or kV according to patient size and/or use of iterative reconstruction technique. COMPARISON:  Chest CT 01/22/2021. FINDINGS: Cardiovascular: Heart size is normal. There is no significant pericardial  fluid, thickening or pericardial calcification. There is aortic atherosclerosis, as well as atherosclerosis of the great vessels of the mediastinum and the coronary arteries, including calcified atherosclerotic plaque in the left anterior descending, left circumflex and right coronary arteries. Ectasia of ascending thoracic aorta (4.4 cm in diameter). Mediastinum/Nodes: No pathologically enlarged mediastinal or hilar lymph nodes. Please note that accurate exclusion of hilar adenopathy is limited on noncontrast CT scans. Esophagus is unremarkable in appearance. No axillary lymphadenopathy. Lungs/Pleura: Status post right upper lobectomy. Chronic scarring and atelectasis in the right middle lobe. Compensatory hyperexpansion of the right lower lobe. 3 mm left upper lobe pulmonary nodule (axial image 66 of series 5), stable, considered definitively benign requiring no future imaging follow-up. No other suspicious appearing pulmonary nodules or masses are noted. No acute consolidative airspace disease. No pleural effusions. Diffuse bronchial wall thickening with severe centrilobular and paraseptal emphysema. Upper Abdomen: Aortic atherosclerosis.  Status post cholecystectomy. Musculoskeletal: There are no aggressive appearing lytic or blastic lesions noted in the visualized portions of the skeleton. IMPRESSION: 1. Status post right upper lobectomy with no findings to suggest locally recurrent disease or definite metastatic disease in the thorax. 2. 3 mm left upper lobe pulmonary nodule, unchanged, considered benign. 3. Diffuse bronchial wall thickening with severe centrilobular and paraseptal emphysema; imaging findings suggestive of underlying COPD. 4. Aortic  atherosclerosis, in addition to three-vessel coronary artery disease. Please note that although the presence of coronary artery calcium documents the presence of coronary artery disease, the severity of this disease and any potential stenosis cannot be assessed on this non-gated CT examination. Assessment for potential risk factor modification, dietary therapy or pharmacologic therapy may be warranted, if clinically indicated. 5. Ectasia of ascending thoracic aorta (4.4 cm in diameter). Recommend annual imaging followup by CTA or MRA. This recommendation follows 2010 ACCF/AHA/AATS/ACR/ASA/SCA/SCAI/SIR/STS/SVM Guidelines for the Diagnosis and Management of Patients with Thoracic Aortic Disease. Circulation. 2010; 121: J009-F818. Aortic aneurysm NOS (ICD10-I71.9). Aortic Atherosclerosis (ICD10-I70.0) and Emphysema (ICD10-J43.9). Electronically Signed   By: Vinnie Langton [name]   On: 01/27/2022 08:31     ASSESSMENT AND PLAN: This is a very pleasant 71 years old white female with a stage IA (T1b, N0, M0) non-small cell lung cancer, adenocarcinoma presented with right upper lobe lung nodule status post right lower lobectomy with lymph node dissection under the care of Dr. Roxan Hockey.   The patient has been on observation since 2021 and she is feeling fine with no concerning complaints. She had repeat CT scan of the chest performed recently.  I personally and independently reviewed the scan and discussed the result with the patient today. Her scan showed no concerning findings for disease recurrence or metastasis. I recommended her to continue on observation with repeat CT scan of the chest in 1 year. Regarding the coronary atherosclerosis and blood vessel disease, she was advised to follow-up with her primary care physician and cardiology for management of this condition. The patient was advised to call immediately if she has any other concerning symptoms in the interval. The patient voices understanding of  current disease status and treatment options and is in agreement with the current care plan.  All questions were answered. The patient knows to call the clinic with any problems, questions or concerns. We can certainly see the patient much sooner if necessary.  Disclaimer: This note was dictated with voice recognition software. Similar sounding words can inadvertently be transcribed and may not be corrected upon review.

## 2022-01-28 DIAGNOSIS — M79642 Pain in left hand: Secondary | ICD-10-CM | POA: Diagnosis not present

## 2022-02-01 DIAGNOSIS — Z1231 Encounter for screening mammogram for malignant neoplasm of breast: Secondary | ICD-10-CM | POA: Diagnosis not present

## 2022-02-02 ENCOUNTER — Encounter: Payer: Self-pay | Admitting: Thoracic Surgery (Cardiothoracic Vascular Surgery)

## 2022-02-02 ENCOUNTER — Ambulatory Visit: Payer: Medicare PPO | Admitting: Thoracic Surgery (Cardiothoracic Vascular Surgery)

## 2022-02-02 VITALS — BP 127/84 | HR 78 | Resp 18 | Ht 64.0 in | Wt 120.0 lb

## 2022-02-02 DIAGNOSIS — Z902 Acquired absence of lung [part of]: Secondary | ICD-10-CM

## 2022-02-02 DIAGNOSIS — C3491 Malignant neoplasm of unspecified part of right bronchus or lung: Secondary | ICD-10-CM

## 2022-02-02 DIAGNOSIS — I7121 Aneurysm of the ascending aorta, without rupture: Secondary | ICD-10-CM

## 2022-02-02 HISTORY — DX: Aneurysm of the ascending aorta, without rupture: I71.21

## 2022-02-02 NOTE — Progress Notes (Signed)
[address]       [place] [address]             [phone]     HPI: Ms. Cillo returns for follow-up of her ascending aorta aneurysm and also resected lung cancer.  [name] is a 71 year old woman with a history of tobacco abuse, COPD, migraines, hypertension, hyperlipidemia, endometriosis, anxiety, thoracic aortic atherosclerosis, ascending thoracic aneurysm, and stage Ia adenocarcinoma of the right upper lobe status post lobectomy.  I did a robotic right upper lobectomy for stage Ia adenocarcinoma in February 2021.  Postoperative course was complicated by an air leak, esophagitis, gastric ulcer, and wide-complex tachycardia.  Later she had issues with intercostal neuralgia.  She was noted on her scans to have an ascending aneurysm.  She recently saw Dr. Julien Nordmann.  She had no evidence of recurrent disease.  She still has numbness in the right costal margin area.  Occasional sharp pain related to the incisions posteriorly.  No shortness of breath.  No chest pain, pressure, or tightness.  Currently smoking 4 to 5 cigarettes/day after previously having quit.  Also complains of some numbness in her toes.  Past Medical History:  Diagnosis Date   Active smoker    per pt quit 01/ 2021 smoking prior to lung lobectomy 02/ 2021 but started back smoking 06/ 2022 stated 5 cig per day   Asthma    Chronic diarrhea    Complication of anesthesia    hard to wake and ponv   Dyslipidemia    Emphysema/COPD ([place])    pulmologist--- dr Erin Fulling;   no oxygen   Endometriosis    Family history of adverse reaction to anesthesia    brother--- slow to wake   Finger fracture, left    left ring and small proximal phalagel fx's   GAD (generalized anxiety disorder)    GERD (gastroesophageal reflux disease)    Hiatal hernia    History of cervical dysplasia    s/p gyn crysurgery yrs ago   History of gastric ulcer 05/2019   post lung lobectomy w/ esophagitis   History of TIA (transient  ischemic attack)    12-22-2021  per pt remote TIA > 10 yrs ago,  no residual   Hypertension    followed by cardiologist and pcp;   cardiac cath 11-20-2002 completely normal;   last nuclear stress test 08-14-2010 low risk normal perfusion no ischemia, nuclear ef 59%   Intermittent palpitations    followed by dr hilty   Lymphocytic colitis    followed by [facility]   Macular degeneration of both eyes    Migraine    Non-small cell lung cancer, right ([place]) 05/2019   surgeon-- dr Roxan Hockey / oncologist-- dr Julien Nordmann  pulmologist-- dr j. Erin Fulling;   05-28-2019  s/p  right upper lobectomy w/ node dissection's,  Stage IA,  no chemo or radiation   NSVT (nonsustained ventricular tachycardia) ([place])    hx bigemy  and psot op wide complex tachycardia s/p lung lobectomy 02/ 2021   OSA (obstructive sleep apnea)    does not wear her CPAP because "it gave me respiratory issues (bronchities and pneumonia)"   Osteoporosis    PONV (postoperative nausea and vomiting)    Secondary polycythemia    evaluation by dr Willene Hatchet note in epic 11-03-2015  per note due to tobacco use and respiratory issues   Thoracic ascending aortic aneurysm ([place]) 02/02/2022    Current Outpatient Medications  Medication Sig  Dispense Refill   ALPRAZolam (XANAX) 0.5 MG tablet Take 0.5 mg by mouth 3 (three) times daily as needed for anxiety.     amLODipine (NORVASC) 2.5 MG tablet TAKE 1 TABLET(2.5 MG) BY MOUTH DAILY (Patient taking differently: Take 2.5 mg by mouth at bedtime.) 90 tablet 1   aspirin EC 81 MG tablet Take 81 mg by mouth at bedtime.     Calcium Citrate-Vitamin D (CALCIUM + D PO) Take 1 tablet by mouth 2 (two) times daily.     cholestyramine (QUESTRAN) 4 GM/DOSE powder Take 4 g by mouth daily.     denosumab (PROLIA) 60 MG/ML SOLN injection Inject 60 mg into the skin every 6 (six) months. Administer in upper arm, thigh, or abdomen     Doxylamine Succinate, Sleep, (SLEEP-AID PO) Take by mouth at bedtime as needed.      ergocalciferol (VITAMIN D2) 1.25 MG (50000 UT) capsule Take 50,000 Units by mouth once a week. Saturday's     eszopiclone 3 MG TABS Take 3 mg by mouth at bedtime as needed. Take immediately before bedtime     gabapentin (NEURONTIN) 300 MG capsule TAKE 2 CAPSULES(600 MG) BY MOUTH THREE TIMES DAILY (Patient taking differently: Take 600 mg by mouth 3 (three) times daily. TAKE 2 CAPSULES(600 MG) BY MOUTH THREE TIMES DAILY) 180 capsule 2   loperamide (IMODIUM) 2 MG capsule Take by mouth as needed for diarrhea or loose stools.     metoprolol succinate (TOPROL-XL) 25 MG 24 hr tablet TAKE 1 TABLET(25 MG) BY MOUTH DAILY (Patient taking differently: Take 25 mg by mouth at bedtime.) 90 tablet 1   metoprolol tartrate (LOPRESSOR) 25 MG tablet Take 25 mg by mouth 2 (two) times daily.     montelukast (SINGULAIR) 10 MG tablet Take 10 mg by mouth at bedtime.   0   Multiple Vitamins-Minerals (MULTIVITAMIN ADULT EXTRA C) CHEW Chew by mouth at bedtime.     Multiple Vitamins-Minerals (PRESERVISION AREDS 2) CAPS Take 2 capsules by mouth at bedtime.     OVER THE COUNTER MEDICATION at bedtime. "Our Choice probiotic"     OVER THE COUNTER MEDICATION at bedtime as needed. Per pt CBD gummy     pantoprazole (PROTONIX) 40 MG tablet Take 1 tablet (40 mg total) by mouth 2 (two) times daily. (Patient taking differently: Take 40 mg by mouth 2 (two) times daily.) 60 tablet 3   rosuvastatin (CRESTOR) 5 MG tablet TAKE 1 TABLET(5 MG) BY MOUTH DAILY 90 tablet 3   SPIRIVA RESPIMAT 2.5 MCG/ACT AERS INHALE 2 PUFFS INTO THE LUNGS DAILY 4 g 0   telmisartan-hydrochlorothiazide (MICARDIS HCT) 80-12.5 MG tablet TAKE 1 TABLET BY MOUTH DAILY (Patient taking differently: Take 1 tablet by mouth daily after lunch.) 90 tablet 3   VENTOLIN HFA 108 (90 BASE) MCG/ACT inhaler Inhale 2 puffs into the lungs every 6 (six) hours as needed for wheezing or shortness of breath.     No current facility-administered medications for this visit.    Physical  Exam BP 127/84 (BP Location: Right Arm, Patient Position: Sitting)   Pulse 78   Resp 18   Ht 5\' 4"  (1.626 m)   Wt 120 lb (54.4 kg)   SpO2 91% Comment: RA  BMI 20.68 kg/m  71 year old woman in no acute distress Alert and oriented x3 with no focal deficits Lungs slightly diminished at right base but otherwise clear with no rales or wheezing Cardiac regular rate and rhythm No cervical or supraclavicular adenopathy 2+ pulses throughout including DP  and PT bilaterally  Diagnostic Tests: CT CHEST WITHOUT CONTRAST   TECHNIQUE: Multidetector CT imaging of the chest was performed following the standard protocol without IV contrast.   RADIATION DOSE REDUCTION: This exam was performed according to the departmental dose-optimization program which includes automated exposure control, adjustment of the mA and/or kV according to patient size and/or use of iterative reconstruction technique.   COMPARISON:  Chest CT 01/22/2021.   FINDINGS: Cardiovascular: Heart size is normal. There is no significant pericardial fluid, thickening or pericardial calcification. There is aortic atherosclerosis, as well as atherosclerosis of the great vessels of the mediastinum and the coronary arteries, including calcified atherosclerotic plaque in the left anterior descending, left circumflex and right coronary arteries. Ectasia of ascending thoracic aorta (4.4 cm in diameter).   Mediastinum/Nodes: No pathologically enlarged mediastinal or hilar lymph nodes. Please note that accurate exclusion of hilar adenopathy is limited on noncontrast CT scans. Esophagus is unremarkable in appearance. No axillary lymphadenopathy.   Lungs/Pleura: Status post right upper lobectomy. Chronic scarring and atelectasis in the right middle lobe. Compensatory hyperexpansion of the right lower lobe. 3 mm left upper lobe pulmonary nodule (axial image 66 of series 5), stable, considered definitively benign requiring no future  imaging follow-up. No other suspicious appearing pulmonary nodules or masses are noted. No acute consolidative airspace disease. No pleural effusions. Diffuse bronchial wall thickening with severe centrilobular and paraseptal emphysema.   Upper Abdomen: Aortic atherosclerosis.  Status post cholecystectomy.   Musculoskeletal: There are no aggressive appearing lytic or blastic lesions noted in the visualized portions of the skeleton.   IMPRESSION: 1. Status post right upper lobectomy with no findings to suggest locally recurrent disease or definite metastatic disease in the thorax. 2. 3 mm left upper lobe pulmonary nodule, unchanged, considered benign. 3. Diffuse bronchial wall thickening with severe centrilobular and paraseptal emphysema; imaging findings suggestive of underlying COPD. 4. Aortic atherosclerosis, in addition to three-vessel coronary artery disease. Please note that although the presence of coronary artery calcium documents the presence of coronary artery disease, the severity of this disease and any potential stenosis cannot be assessed on this non-gated CT examination. Assessment for potential risk factor modification, dietary therapy or pharmacologic therapy may be warranted, if clinically indicated. 5. Ectasia of ascending thoracic aorta (4.4 cm in diameter). Recommend annual imaging followup by CTA or MRA. This recommendation follows 2010 ACCF/AHA/AATS/ACR/ASA/SCA/SCAI/SIR/STS/SVM Guidelines for the Diagnosis and Management of Patients with Thoracic Aortic Disease. Circulation. 2010; 121: R485-I627. Aortic aneurysm NOS (ICD10-I71.9).   Aortic Atherosclerosis (ICD10-I70.0) and Emphysema (ICD10-J43.9).     Electronically Signed   By: Vinnie Langton [name]   On: 01/27/2022 08:31 I personally reviewed the CT images.  There is extensive aortic and coronary atherosclerosis.  Ascending aneurysm measuring 4.3 to 4.4 cm.  No suspicious lung nodules or  adenopathy.  Impression: [name] is a 71 year old woman with a history of tobacco abuse, COPD, migraines, hypertension, hyperlipidemia, endometriosis, anxiety, thoracic aortic atherosclerosis, ascending thoracic aneurysm, and stage Ia adenocarcinoma of the right upper lobe status post lobectomy.  Adenocarcinoma of the lung-stage Ia.  Status post right upper lobectomy.  Now 2-1/2 years out with no evidence of recurrent disease.  Intercostal neuralgia-some residual numbness along the right costal margin right upper quadrant region.  Not unusual after this type of operation.  Ascending aneurysm-stable around 4.3 to 4.4 cm.  Needs continued annual follow-up.  Importance of blood pressure control emphasized.  Thoracic aortic and coronary atherosclerosis-asymptomatic.  On rosuvastatin.  Plan: Return in 1 year  after CT chest  I spent over 20 minutes in review of records, images, and in consultation with Ms. [name] today. Melrose Nakayama, MD [facility] [phone]

## 2022-02-09 DIAGNOSIS — M25642 Stiffness of left hand, not elsewhere classified: Secondary | ICD-10-CM | POA: Diagnosis not present

## 2022-02-09 DIAGNOSIS — M79642 Pain in left hand: Secondary | ICD-10-CM | POA: Diagnosis not present

## 2022-02-11 DIAGNOSIS — M25642 Stiffness of left hand, not elsewhere classified: Secondary | ICD-10-CM | POA: Diagnosis not present

## 2022-02-11 DIAGNOSIS — M79642 Pain in left hand: Secondary | ICD-10-CM | POA: Diagnosis not present

## 2022-02-16 DIAGNOSIS — M79642 Pain in left hand: Secondary | ICD-10-CM | POA: Diagnosis not present

## 2022-02-18 DIAGNOSIS — Z4789 Encounter for other orthopedic aftercare: Secondary | ICD-10-CM | POA: Diagnosis not present

## 2022-02-18 DIAGNOSIS — M25552 Pain in left hip: Secondary | ICD-10-CM | POA: Diagnosis not present

## 2022-02-18 DIAGNOSIS — M25642 Stiffness of left hand, not elsewhere classified: Secondary | ICD-10-CM | POA: Diagnosis not present

## 2022-02-18 DIAGNOSIS — M7062 Trochanteric bursitis, left hip: Secondary | ICD-10-CM | POA: Diagnosis not present

## 2022-02-18 DIAGNOSIS — M79642 Pain in left hand: Secondary | ICD-10-CM | POA: Diagnosis not present

## 2022-02-19 DIAGNOSIS — R921 Mammographic calcification found on diagnostic imaging of breast: Secondary | ICD-10-CM | POA: Diagnosis not present

## 2022-02-23 DIAGNOSIS — M25642 Stiffness of left hand, not elsewhere classified: Secondary | ICD-10-CM | POA: Diagnosis not present

## 2022-02-23 DIAGNOSIS — M79642 Pain in left hand: Secondary | ICD-10-CM | POA: Diagnosis not present

## 2022-02-25 DIAGNOSIS — M25642 Stiffness of left hand, not elsewhere classified: Secondary | ICD-10-CM | POA: Diagnosis not present

## 2022-02-25 DIAGNOSIS — M79642 Pain in left hand: Secondary | ICD-10-CM | POA: Diagnosis not present

## 2022-03-02 DIAGNOSIS — M79642 Pain in left hand: Secondary | ICD-10-CM | POA: Diagnosis not present

## 2022-03-02 DIAGNOSIS — M25642 Stiffness of left hand, not elsewhere classified: Secondary | ICD-10-CM | POA: Diagnosis not present

## 2022-03-09 DIAGNOSIS — M25642 Stiffness of left hand, not elsewhere classified: Secondary | ICD-10-CM | POA: Diagnosis not present

## 2022-03-09 DIAGNOSIS — M79642 Pain in left hand: Secondary | ICD-10-CM | POA: Diagnosis not present

## 2022-03-16 DIAGNOSIS — M79642 Pain in left hand: Secondary | ICD-10-CM | POA: Diagnosis not present

## 2022-03-23 ENCOUNTER — Other Ambulatory Visit: Payer: Self-pay | Admitting: Internal Medicine

## 2022-03-23 DIAGNOSIS — M79642 Pain in left hand: Secondary | ICD-10-CM | POA: Diagnosis not present

## 2022-03-23 DIAGNOSIS — M25642 Stiffness of left hand, not elsewhere classified: Secondary | ICD-10-CM | POA: Diagnosis not present

## 2022-03-29 DIAGNOSIS — M79642 Pain in left hand: Secondary | ICD-10-CM | POA: Diagnosis not present

## 2022-03-29 DIAGNOSIS — M25642 Stiffness of left hand, not elsewhere classified: Secondary | ICD-10-CM | POA: Diagnosis not present

## 2022-03-30 DIAGNOSIS — M25642 Stiffness of left hand, not elsewhere classified: Secondary | ICD-10-CM | POA: Diagnosis not present

## 2022-03-30 DIAGNOSIS — M79642 Pain in left hand: Secondary | ICD-10-CM | POA: Diagnosis not present

## 2022-04-07 DIAGNOSIS — M79642 Pain in left hand: Secondary | ICD-10-CM | POA: Diagnosis not present

## 2022-04-07 DIAGNOSIS — M25642 Stiffness of left hand, not elsewhere classified: Secondary | ICD-10-CM | POA: Diagnosis not present

## 2022-04-09 DIAGNOSIS — H02889 Meibomian gland dysfunction of unspecified eye, unspecified eyelid: Secondary | ICD-10-CM | POA: Diagnosis not present

## 2022-04-09 DIAGNOSIS — H5201 Hypermetropia, right eye: Secondary | ICD-10-CM | POA: Diagnosis not present

## 2022-04-09 DIAGNOSIS — H524 Presbyopia: Secondary | ICD-10-CM | POA: Diagnosis not present

## 2022-04-09 DIAGNOSIS — H52223 Regular astigmatism, bilateral: Secondary | ICD-10-CM | POA: Diagnosis not present

## 2022-04-09 DIAGNOSIS — Z961 Presence of intraocular lens: Secondary | ICD-10-CM | POA: Diagnosis not present

## 2022-04-09 DIAGNOSIS — H353132 Nonexudative age-related macular degeneration, bilateral, intermediate dry stage: Secondary | ICD-10-CM | POA: Diagnosis not present

## 2022-04-13 DIAGNOSIS — M25642 Stiffness of left hand, not elsewhere classified: Secondary | ICD-10-CM | POA: Diagnosis not present

## 2022-04-13 DIAGNOSIS — M79642 Pain in left hand: Secondary | ICD-10-CM | POA: Diagnosis not present

## 2022-04-16 DIAGNOSIS — M79642 Pain in left hand: Secondary | ICD-10-CM | POA: Diagnosis not present

## 2022-04-22 DIAGNOSIS — M79642 Pain in left hand: Secondary | ICD-10-CM | POA: Diagnosis not present

## 2022-04-22 DIAGNOSIS — M25642 Stiffness of left hand, not elsewhere classified: Secondary | ICD-10-CM | POA: Diagnosis not present

## 2022-04-28 DIAGNOSIS — Z0000 Encounter for general adult medical examination without abnormal findings: Secondary | ICD-10-CM | POA: Diagnosis not present

## 2022-04-28 DIAGNOSIS — N390 Urinary tract infection, site not specified: Secondary | ICD-10-CM | POA: Diagnosis not present

## 2022-04-28 DIAGNOSIS — E7800 Pure hypercholesterolemia, unspecified: Secondary | ICD-10-CM | POA: Diagnosis not present

## 2022-04-28 DIAGNOSIS — E039 Hypothyroidism, unspecified: Secondary | ICD-10-CM | POA: Diagnosis not present

## 2022-04-28 DIAGNOSIS — E559 Vitamin D deficiency, unspecified: Secondary | ICD-10-CM | POA: Diagnosis not present

## 2022-04-29 DIAGNOSIS — M79642 Pain in left hand: Secondary | ICD-10-CM | POA: Diagnosis not present

## 2022-04-29 DIAGNOSIS — M25642 Stiffness of left hand, not elsewhere classified: Secondary | ICD-10-CM | POA: Diagnosis not present

## 2022-05-05 DIAGNOSIS — F1010 Alcohol abuse, uncomplicated: Secondary | ICD-10-CM | POA: Diagnosis not present

## 2022-05-05 DIAGNOSIS — E871 Hypo-osmolality and hyponatremia: Secondary | ICD-10-CM | POA: Diagnosis not present

## 2022-05-05 DIAGNOSIS — I700 Atherosclerosis of aorta: Secondary | ICD-10-CM | POA: Diagnosis not present

## 2022-05-05 DIAGNOSIS — M810 Age-related osteoporosis without current pathological fracture: Secondary | ICD-10-CM | POA: Diagnosis not present

## 2022-05-05 DIAGNOSIS — E7800 Pure hypercholesterolemia, unspecified: Secondary | ICD-10-CM | POA: Diagnosis not present

## 2022-05-05 DIAGNOSIS — F17200 Nicotine dependence, unspecified, uncomplicated: Secondary | ICD-10-CM | POA: Diagnosis not present

## 2022-05-05 DIAGNOSIS — E559 Vitamin D deficiency, unspecified: Secondary | ICD-10-CM | POA: Diagnosis not present

## 2022-05-05 DIAGNOSIS — J449 Chronic obstructive pulmonary disease, unspecified: Secondary | ICD-10-CM | POA: Diagnosis not present

## 2022-05-05 DIAGNOSIS — Z0000 Encounter for general adult medical examination without abnormal findings: Secondary | ICD-10-CM | POA: Diagnosis not present

## 2022-05-05 DIAGNOSIS — I10 Essential (primary) hypertension: Secondary | ICD-10-CM | POA: Diagnosis not present

## 2022-05-13 DIAGNOSIS — M25642 Stiffness of left hand, not elsewhere classified: Secondary | ICD-10-CM | POA: Diagnosis not present

## 2022-05-13 DIAGNOSIS — M79642 Pain in left hand: Secondary | ICD-10-CM | POA: Diagnosis not present

## 2022-05-25 DIAGNOSIS — M79642 Pain in left hand: Secondary | ICD-10-CM | POA: Diagnosis not present

## 2022-05-25 DIAGNOSIS — M25642 Stiffness of left hand, not elsewhere classified: Secondary | ICD-10-CM | POA: Diagnosis not present

## 2022-06-01 DIAGNOSIS — M79642 Pain in left hand: Secondary | ICD-10-CM | POA: Diagnosis not present

## 2022-06-01 DIAGNOSIS — M25642 Stiffness of left hand, not elsewhere classified: Secondary | ICD-10-CM | POA: Diagnosis not present

## 2022-06-23 ENCOUNTER — Other Ambulatory Visit: Payer: Self-pay | Admitting: Internal Medicine

## 2022-06-29 DIAGNOSIS — M810 Age-related osteoporosis without current pathological fracture: Secondary | ICD-10-CM | POA: Diagnosis not present

## 2022-07-28 ENCOUNTER — Emergency Department (HOSPITAL_COMMUNITY): Payer: Medicare PPO

## 2022-07-28 ENCOUNTER — Encounter (HOSPITAL_COMMUNITY): Payer: Self-pay

## 2022-07-28 ENCOUNTER — Inpatient Hospital Stay (HOSPITAL_COMMUNITY)
Admission: EM | Admit: 2022-07-28 | Discharge: 2022-07-31 | DRG: 392 | Disposition: A | Discharge status: Home / Self Care | Payer: Medicare PPO | Attending: Student | Admitting: Student

## 2022-07-28 ENCOUNTER — Inpatient Hospital Stay (HOSPITAL_COMMUNITY): Payer: Medicare PPO

## 2022-07-28 ENCOUNTER — Other Ambulatory Visit: Payer: Self-pay

## 2022-07-28 DIAGNOSIS — K760 Fatty (change of) liver, not elsewhere classified: Secondary | ICD-10-CM | POA: Diagnosis present

## 2022-07-28 DIAGNOSIS — I10 Essential (primary) hypertension: Secondary | ICD-10-CM | POA: Diagnosis present

## 2022-07-28 DIAGNOSIS — Z888 Allergy status to other drugs, medicaments and biological substances status: Secondary | ICD-10-CM

## 2022-07-28 DIAGNOSIS — Z9049 Acquired absence of other specified parts of digestive tract: Secondary | ICD-10-CM | POA: Diagnosis not present

## 2022-07-28 DIAGNOSIS — C3491 Malignant neoplasm of unspecified part of right bronchus or lung: Secondary | ICD-10-CM | POA: Diagnosis present

## 2022-07-28 DIAGNOSIS — F17210 Nicotine dependence, cigarettes, uncomplicated: Secondary | ICD-10-CM | POA: Diagnosis present

## 2022-07-28 DIAGNOSIS — Z8249 Family history of ischemic heart disease and other diseases of the circulatory system: Secondary | ICD-10-CM | POA: Diagnosis not present

## 2022-07-28 DIAGNOSIS — K52832 Lymphocytic colitis: Principal | ICD-10-CM | POA: Diagnosis present

## 2022-07-28 DIAGNOSIS — R933 Abnormal findings on diagnostic imaging of other parts of digestive tract: Secondary | ICD-10-CM | POA: Diagnosis not present

## 2022-07-28 DIAGNOSIS — Z885 Allergy status to narcotic agent status: Secondary | ICD-10-CM

## 2022-07-28 DIAGNOSIS — Z902 Acquired absence of lung [part of]: Secondary | ICD-10-CM

## 2022-07-28 DIAGNOSIS — K219 Gastro-esophageal reflux disease without esophagitis: Secondary | ICD-10-CM | POA: Diagnosis present

## 2022-07-28 DIAGNOSIS — R42 Dizziness and giddiness: Secondary | ICD-10-CM | POA: Diagnosis not present

## 2022-07-28 DIAGNOSIS — E860 Dehydration: Secondary | ICD-10-CM | POA: Diagnosis not present

## 2022-07-28 DIAGNOSIS — N179 Acute kidney failure, unspecified: Secondary | ICD-10-CM | POA: Diagnosis not present

## 2022-07-28 DIAGNOSIS — E876 Hypokalemia: Secondary | ICD-10-CM | POA: Diagnosis not present

## 2022-07-28 DIAGNOSIS — Z79899 Other long term (current) drug therapy: Secondary | ICD-10-CM

## 2022-07-28 DIAGNOSIS — Z7982 Long term (current) use of aspirin: Secondary | ICD-10-CM

## 2022-07-28 DIAGNOSIS — E861 Hypovolemia: Secondary | ICD-10-CM | POA: Diagnosis present

## 2022-07-28 DIAGNOSIS — D72828 Other elevated white blood cell count: Secondary | ICD-10-CM | POA: Diagnosis present

## 2022-07-28 DIAGNOSIS — Z85118 Personal history of other malignant neoplasm of bronchus and lung: Secondary | ICD-10-CM

## 2022-07-28 DIAGNOSIS — R197 Diarrhea, unspecified: Principal | ICD-10-CM | POA: Diagnosis present

## 2022-07-28 DIAGNOSIS — Z720 Tobacco use: Secondary | ICD-10-CM | POA: Diagnosis not present

## 2022-07-28 DIAGNOSIS — G4700 Insomnia, unspecified: Secondary | ICD-10-CM | POA: Diagnosis present

## 2022-07-28 DIAGNOSIS — Z91041 Radiographic dye allergy status: Secondary | ICD-10-CM

## 2022-07-28 DIAGNOSIS — Z87820 Personal history of traumatic brain injury: Secondary | ICD-10-CM | POA: Diagnosis not present

## 2022-07-28 DIAGNOSIS — G629 Polyneuropathy, unspecified: Secondary | ICD-10-CM | POA: Diagnosis present

## 2022-07-28 DIAGNOSIS — K838 Other specified diseases of biliary tract: Secondary | ICD-10-CM | POA: Diagnosis not present

## 2022-07-28 DIAGNOSIS — J439 Emphysema, unspecified: Secondary | ICD-10-CM | POA: Diagnosis not present

## 2022-07-28 DIAGNOSIS — M810 Age-related osteoporosis without current pathological fracture: Secondary | ICD-10-CM | POA: Diagnosis present

## 2022-07-28 DIAGNOSIS — F411 Generalized anxiety disorder: Secondary | ICD-10-CM | POA: Diagnosis present

## 2022-07-28 DIAGNOSIS — R0602 Shortness of breath: Secondary | ICD-10-CM | POA: Diagnosis present

## 2022-07-28 DIAGNOSIS — E871 Hypo-osmolality and hyponatremia: Secondary | ICD-10-CM | POA: Diagnosis present

## 2022-07-28 DIAGNOSIS — Z853 Personal history of malignant neoplasm of breast: Secondary | ICD-10-CM | POA: Diagnosis not present

## 2022-07-28 DIAGNOSIS — Z90710 Acquired absence of both cervix and uterus: Secondary | ICD-10-CM | POA: Diagnosis not present

## 2022-07-28 DIAGNOSIS — J4489 Other specified chronic obstructive pulmonary disease: Secondary | ICD-10-CM | POA: Diagnosis not present

## 2022-07-28 DIAGNOSIS — F419 Anxiety disorder, unspecified: Secondary | ICD-10-CM | POA: Diagnosis present

## 2022-07-28 DIAGNOSIS — E785 Hyperlipidemia, unspecified: Secondary | ICD-10-CM | POA: Diagnosis present

## 2022-07-28 DIAGNOSIS — R152 Fecal urgency: Secondary | ICD-10-CM | POA: Diagnosis present

## 2022-07-28 DIAGNOSIS — R935 Abnormal findings on diagnostic imaging of other abdominal regions, including retroperitoneum: Secondary | ICD-10-CM | POA: Diagnosis not present

## 2022-07-28 LAB — COMPREHENSIVE METABOLIC PANEL
ALT: 36 U/L (ref 0–44)
AST: 48 U/L — ABNORMAL HIGH (ref 15–41)
Albumin: 4.1 g/dL (ref 3.5–5.0)
Alkaline Phosphatase: 61 U/L (ref 38–126)
Anion gap: 13 (ref 5–15)
BUN: 11 mg/dL (ref 8–23)
CO2: 29 mmol/L (ref 22–32)
Calcium: 9.1 mg/dL (ref 8.9–10.3)
Chloride: 87 mmol/L — ABNORMAL LOW (ref 98–111)
Creatinine, Ser: 1.02 mg/dL — ABNORMAL HIGH (ref 0.44–1.00)
GFR, Estimated: 59 mL/min — ABNORMAL LOW (ref >60)
Glucose, Bld: 111 mg/dL — ABNORMAL HIGH (ref 70–99)
Potassium: 2.6 mmol/L — CL (ref 3.5–5.1)
Sodium: 129 mmol/L — ABNORMAL LOW (ref 135–145)
Total Bilirubin: 0.9 mg/dL (ref 0.3–1.2)
Total Protein: 6.8 g/dL (ref 6.5–8.1)

## 2022-07-28 LAB — CBC
HCT: 41.6 % (ref 36.0–46.0)
Hemoglobin: 14.5 g/dL (ref 12.0–15.0)
MCH: 33.9 pg (ref 26.0–34.0)
MCHC: 34.9 g/dL (ref 30.0–36.0)
MCV: 97.2 fL (ref 80.0–100.0)
Platelets: 241 10*3/uL (ref 150–400)
RBC: 4.28 MIL/uL (ref 3.87–5.11)
RDW: 13.1 % (ref 11.5–15.5)
WBC: 11.8 10*3/uL — ABNORMAL HIGH (ref 4.0–10.5)
nRBC: 0 % (ref 0.0–0.2)

## 2022-07-28 LAB — URINALYSIS, ROUTINE W REFLEX MICROSCOPIC
Bilirubin Urine: NEGATIVE
Glucose, UA: NEGATIVE mg/dL
Hgb urine dipstick: NEGATIVE
Ketones, ur: 5 mg/dL — AB
Leukocytes,Ua: NEGATIVE
Nitrite: NEGATIVE
Protein, ur: NEGATIVE mg/dL
Specific Gravity, Urine: 1.005 (ref 1.005–1.030)
pH: 5 (ref 5.0–8.0)

## 2022-07-28 LAB — CBG MONITORING, ED: Glucose-Capillary: 131 mg/dL — ABNORMAL HIGH (ref 70–99)

## 2022-07-28 LAB — LIPASE, BLOOD: Lipase: 25 U/L (ref 11–51)

## 2022-07-28 MED ORDER — METRONIDAZOLE 500 MG/100ML IV SOLN
500.0000 mg | Freq: Two times a day (BID) | INTRAVENOUS | Status: DC
  Administered 2022-07-29: 500 mg via INTRAVENOUS
  Filled 2022-07-28: qty 100

## 2022-07-28 MED ORDER — GABAPENTIN 300 MG PO CAPS
600.0000 mg | ORAL_CAPSULE | Freq: Three times a day (TID) | ORAL | Status: DC
  Administered 2022-07-28 – 2022-07-31 (×8): 600 mg via ORAL
  Filled 2022-07-28 (×8): qty 2

## 2022-07-28 MED ORDER — PANTOPRAZOLE SODIUM 40 MG PO TBEC
40.0000 mg | DELAYED_RELEASE_TABLET | Freq: Two times a day (BID) | ORAL | Status: DC
  Administered 2022-07-28 – 2022-07-31 (×6): 40 mg via ORAL
  Filled 2022-07-28 (×6): qty 1

## 2022-07-28 MED ORDER — CHOLESTYRAMINE LIGHT 4 G PO PACK
4.0000 g | PACK | Freq: Every day | ORAL | Status: DC
  Administered 2022-07-29: 4 g via ORAL
  Filled 2022-07-28 (×3): qty 1

## 2022-07-28 MED ORDER — MONTELUKAST SODIUM 10 MG PO TABS
10.0000 mg | ORAL_TABLET | Freq: Every day | ORAL | Status: DC
  Administered 2022-07-28 – 2022-07-30 (×3): 10 mg via ORAL
  Filled 2022-07-28 (×3): qty 1

## 2022-07-28 MED ORDER — SODIUM CHLORIDE 0.9 % IV SOLN
1.0000 g | Freq: Once | INTRAVENOUS | Status: AC
  Administered 2022-07-28: 1 g via INTRAVENOUS
  Filled 2022-07-28: qty 10

## 2022-07-28 MED ORDER — POTASSIUM CHLORIDE 10 MEQ/100ML IV SOLN
10.0000 meq | INTRAVENOUS | Status: AC
  Administered 2022-07-28 (×2): 10 meq via INTRAVENOUS
  Filled 2022-07-28 (×2): qty 100

## 2022-07-28 MED ORDER — LACTATED RINGERS IV BOLUS
1000.0000 mL | Freq: Once | INTRAVENOUS | Status: AC
  Administered 2022-07-28: 1000 mL via INTRAVENOUS

## 2022-07-28 MED ORDER — PROCHLORPERAZINE EDISYLATE 10 MG/2ML IJ SOLN
5.0000 mg | Freq: Four times a day (QID) | INTRAMUSCULAR | Status: DC | PRN
  Administered 2022-07-31: 5 mg via INTRAVENOUS
  Filled 2022-07-28: qty 2

## 2022-07-28 MED ORDER — UMECLIDINIUM BROMIDE 62.5 MCG/ACT IN AEPB
1.0000 | INHALATION_SPRAY | Freq: Every day | RESPIRATORY_TRACT | Status: DC
  Administered 2022-07-29 – 2022-07-30 (×2): 1 via RESPIRATORY_TRACT
  Filled 2022-07-28: qty 7

## 2022-07-28 MED ORDER — POTASSIUM CHLORIDE IN NACL 40-0.9 MEQ/L-% IV SOLN
INTRAVENOUS | Status: DC
  Filled 2022-07-28 (×4): qty 1000

## 2022-07-28 MED ORDER — AMLODIPINE BESYLATE 5 MG PO TABS: 2.5000 mg | ORAL_TABLET | Freq: Every day | ORAL | Status: DC

## 2022-07-28 MED ORDER — ADULT MULTIVITAMIN W/MINERALS CH
1.0000 | ORAL_TABLET | Freq: Every day | ORAL | Status: DC
  Administered 2022-07-28 – 2022-07-30 (×3): 1 via ORAL
  Filled 2022-07-28 (×3): qty 1

## 2022-07-28 MED ORDER — TIOTROPIUM BROMIDE MONOHYDRATE 2.5 MCG/ACT IN AERS: 2.0000 | INHALATION_SPRAY | Freq: Every day | RESPIRATORY_TRACT | Status: DC

## 2022-07-28 MED ORDER — ROSUVASTATIN CALCIUM 5 MG PO TABS: 5.0000 mg | ORAL_TABLET | Freq: Every day | ORAL | Status: DC

## 2022-07-28 MED ORDER — POTASSIUM CHLORIDE CRYS ER 20 MEQ PO TBCR
40.0000 meq | EXTENDED_RELEASE_TABLET | Freq: Once | ORAL | Status: AC
Start: 2022-07-28 — End: 2022-07-28
  Administered 2022-07-28: 40 meq via ORAL
  Filled 2022-07-28: qty 2

## 2022-07-28 MED ORDER — ENOXAPARIN SODIUM 40 MG/0.4ML IJ SOSY
40.0000 mg | PREFILLED_SYRINGE | INTRAMUSCULAR | Status: DC
  Administered 2022-07-28 – 2022-07-30 (×3): 40 mg via SUBCUTANEOUS
  Filled 2022-07-28 (×3): qty 0.4

## 2022-07-28 MED ORDER — METRONIDAZOLE 500 MG/100ML IV SOLN
500.0000 mg | Freq: Two times a day (BID) | INTRAVENOUS | Status: DC
Start: 2022-07-28 — End: 2022-07-28
  Filled 2022-07-28: qty 100

## 2022-07-28 MED ORDER — ALBUTEROL SULFATE (2.5 MG/3ML) 0.083% IN NEBU
3.0000 mL | INHALATION_SOLUTION | Freq: Four times a day (QID) | RESPIRATORY_TRACT | Status: DC | PRN
  Administered 2022-07-29 – 2022-07-30 (×3): 3 mL via RESPIRATORY_TRACT
  Filled 2022-07-28 (×3): qty 3

## 2022-07-28 MED ORDER — METOPROLOL SUCCINATE ER 25 MG PO TB24
12.5000 mg | ORAL_TABLET | Freq: Every day | ORAL | Status: DC
  Administered 2022-07-28: 12.5 mg via ORAL
  Filled 2022-07-28: qty 1

## 2022-07-28 MED ORDER — SODIUM CHLORIDE 0.9 % IV SOLN
1.0000 g | Freq: Once | INTRAVENOUS | Status: DC
  Administered 2022-07-28: 1 g via INTRAVENOUS
  Filled 2022-07-28: qty 10

## 2022-07-28 MED ORDER — ALPRAZOLAM 0.5 MG PO TABS
0.5000 mg | ORAL_TABLET | Freq: Three times a day (TID) | ORAL | Status: DC | PRN
  Administered 2022-07-28 – 2022-07-31 (×6): 0.5 mg via ORAL
  Filled 2022-07-28 (×7): qty 1

## 2022-07-28 MED ORDER — SODIUM CHLORIDE 0.9 % IV SOLN: 2.0000 g | INTRAVENOUS | Status: DC

## 2022-07-28 NOTE — ED Notes (Signed)
ED TO INPATIENT HANDOFF REPORT  ED Nurse Name and Phone #: [phone] [facility]  S Name/Age/Gender [name] 72 y.o. female Room/Bed: WA11/WA11  Code Status   Code Status: Full Code  Home/SNF/Other Home Patient oriented to: self, place, time, and situation Is this baseline? Yes   Triage Complete: Triage complete  Chief Complaint Diarrhea [R19.7]  Triage Note Pt presents with c/o dizziness that started today. Pt also reports some diarrhea as well with some mild headaches.   Allergies Allergies  Allergen Reactions   Iodinated Contrast Media Itching    itching but no hives just after iv contrast injection w/ 13 hr prep, future contrast not advised unless absolutely necessary, 13 hr prep would still be advised.//a.calhoun   Ioxaglate Itching    itching but no hives just after iv contrast injection w/ 13 hr prep, future contrast not advised unless absolutely necessary, 13 hr prep would still be advised.//a.calhoun   Omnipaque [Iohexol] Anaphylaxis   Budesonide Nausea And Vomiting    Other reaction(s): flushing, vomiting   Buprenorphine Hcl Nausea And Vomiting   Codeine Nausea And Vomiting   Erythromycin     Pt unsure reaction   Morphine And Related Nausea And Vomiting    Level of Care/Admitting Diagnosis ED Disposition     ED Disposition  Admit   Condition  --   Comment  Hospital Area: [facility] [[id number]]  Level of Care: Telemetry [5]  Admit to tele based on following criteria: Monitor for Ischemic changes  May [facility] patient to [facility] or [facility] if equivalent level of care is available:: Yes  Covid Evaluation: Asymptomatic - no recent exposure (last 10 days) testing not required  Diagnosis: Diarrhea [787.91.ICD-9-CM]  Admitting Physician: Kayleen Memos [id number]  Attending Physician: Kayleen Memos [id number]  Certification:: I certify this patient will need inpatient services for at least 2 midnights  Estimated Length of Stay: 2           B Medical/Surgery History Past Medical History:  Diagnosis Date   Active smoker    per pt quit 01/ 2021 smoking prior to lung lobectomy 02/ 2021 but started back smoking 06/ 2022 stated 5 cig per day   Asthma    Chronic diarrhea    Complication of anesthesia    hard to wake and ponv   Dyslipidemia    Emphysema/COPD    pulmologist--- dr Erin Fulling;   no oxygen   Endometriosis    Family history of adverse reaction to anesthesia    brother--- slow to wake   Finger fracture, left    left ring and small proximal phalagel fx's   GAD (generalized anxiety disorder)    GERD (gastroesophageal reflux disease)    Hiatal hernia    History of cervical dysplasia    s/p gyn crysurgery yrs ago   History of gastric ulcer 05/2019   post lung lobectomy w/ esophagitis   History of TIA (transient ischemic attack)    12-22-2021  per pt remote TIA > 10 yrs ago,  no residual   Hypertension    followed by cardiologist and pcp;   cardiac cath 11-20-2002 completely normal;   last nuclear stress test 08-14-2010 low risk normal perfusion no ischemia, nuclear ef 59%   Intermittent palpitations    followed by dr Debara Pickett   Lymphocytic colitis    followed by [facility]   Macular degeneration of both eyes    Migraine    Non-small cell lung cancer, right 05/2019  surgeon-- dr Roxan Hockey / oncologist-- dr Julien Nordmann  pulmologist-- dr j. Erin Fulling;   05-28-2019  s/p  right upper lobectomy w/ node dissection's,  Stage IA,  no chemo or radiation   NSVT (nonsustained ventricular tachycardia)    hx bigemy  and psot op wide complex tachycardia s/p lung lobectomy 02/ 2021   OSA (obstructive sleep apnea)    does not wear her CPAP because "it gave me respiratory issues (bronchities and pneumonia)"   Osteoporosis    PONV (postoperative nausea and vomiting)    Secondary polycythemia    evaluation by dr Willene Hatchet note in epic 11-03-2015  per note due to tobacco use and respiratory issues   Thoracic ascending aortic  aneurysm 02/02/2022   Past Surgical History:  Procedure Laterality Date   BIOPSY  06/05/2019   Procedure: BIOPSY;  Surgeon: Carol Ada, MD;  Location: [place];  Service: Endoscopy;;   CARDIAC CATHETERIZATION  11/20/2002   @[facility]  by dr gamble;   completey normal;  patent coronary arteries   CATARACT EXTRACTION W/ INTRAOCULAR LENS IMPLANT Bilateral 08/2020   ESOPHAGOGASTRODUODENOSCOPY Left 06/05/2019   Procedure: ESOPHAGOGASTRODUODENOSCOPY (EGD);  Surgeon: Carol Ada, MD;  Location: [place];  Service: Endoscopy;  Laterality: Left;   GYNECOLOGIC CRYOSURGERY     many yrs ago   INTERCOSTAL NERVE BLOCK Right 05/28/2019   Procedure: Intercostal Nerve Block;  Surgeon: Melrose Nakayama, MD;  Location: [place];  Service: Thoracic;  Laterality: Right;   LAPAROSCOPIC CHOLECYSTECTOMY  10/15/2002   @[facility]    LUMBAR [place] SURGERY  08/15/2000   @[facility]  by dr Sherwood Gambler;   left L3--4   (and also lumbar surgery approx. 1989)   NASAL SINUS SURGERY  2004   approx   NODE DISSECTION  05/28/2019   Procedure: Node Dissection;  Surgeon: Melrose Nakayama, MD;  Location: [place];  Service: Thoracic;;   OPEN REDUCTION INTERNAL FIXATION (ORIF) PROXIMAL PHALANX Left 12/29/2021   Procedure: Left ring and small finger proximal phalangeal closed reduction percutaneous pinning;  Surgeon: Orene Desanctis, MD;  Location: [place];  Service: Orthopedics;  Laterality: Left;   ROTATOR CUFF REPAIR Left    x2  last one  1990s   THORACOSCOPY  05/28/2019   THORASCOPY-WEDGE RESECTION AND  RIGHT  UPPER LOBECTOMY (Right)   THROAT SURGERY  1997   REMOVAL OF TUMOR  (PER PT BENIGN)   TONSILLECTOMY     age 23   VAGINAL HYSTERECTOMY  2000   w/  BILATERAL SALPINGOOPHORECTOMY     A IV Location/Drains/Wounds Patient Lines/Drains/Airways Status     Active Line/Drains/Airways     Name Placement date Placement time Site Days   Peripheral IV 07/28/22 20 G Right;Posterior Forearm 07/28/22  1815  Forearm  less  than 1   Peripheral IV 07/28/22 22 G Anterior;Distal;Left Forearm 07/28/22  2037  Forearm  less than 1   Incision (Closed) 05/28/19 Chest Right 05/28/19  1222  -- 1157   Incision (Closed) 12/29/21 Hand Left 12/29/21  1305  -- 211   Incision - 3 Ports Chest Right;Mid;Lateral Right;Mid;Anterior;Lateral Right;Posterior;Mid;Lateral 05/28/19  0900  -- 1157            Intake/Output Last 24 hours  Intake/Output Summary (Last 24 hours) at 07/28/2022 2120 Last data filed at 07/28/2022 2114 Gross per 24 hour  Intake 1197.66 ml  Output --  Net 1197.66 ml    Labs/Imaging Results for orders placed or performed during the hospital encounter of 07/28/22 (from the past 48 hour(s))  CBG  monitoring, ED     Status: Abnormal   Collection Time: 07/28/22  5:39 PM  Result Value Ref Range   Glucose-Capillary 131 (H) 70 - 99 mg/dL    Comment: Glucose reference range applies only to samples taken after fasting for at least 8 hours.  CBC     Status: Abnormal   Collection Time: 07/28/22  6:21 PM  Result Value Ref Range   WBC 11.8 (H) 4.0 - 10.5 K/uL   RBC 4.28 3.87 - 5.11 MIL/uL   Hemoglobin 14.5 12.0 - 15.0 g/dL   HCT 41.6 36.0 - 46.0 %   MCV 97.2 80.0 - 100.0 fL   MCH 33.9 26.0 - 34.0 pg   MCHC 34.9 30.0 - 36.0 g/dL   RDW 13.1 11.5 - 15.5 %   Platelets 241 150 - 400 K/uL   nRBC 0.0 0.0 - 0.2 %    Comment: Performed at [facility], [place] [address], [place], [place] [address]  Comprehensive metabolic panel     Status: Abnormal   Collection Time: 07/28/22  6:21 PM  Result Value Ref Range   Sodium 129 (L) 135 - 145 mmol/L   Potassium 2.6 (LL) 3.5 - 5.1 mmol/L    Comment: CRITICAL RESULT CALLED TO, READ BACK BY AND VERIFIED WITH&R& RN Sharai Overbay AT 1909 07/28/22 CRUICKSHANK A    Chloride 87 (L) 98 - 111 mmol/L   CO2 29 22 - 32 mmol/L   Glucose, Bld 111 (H) 70 - 99 mg/dL    Comment: Glucose reference range applies only to samples taken after fasting for at least 8 hours.   BUN 11 8 - 23  mg/dL   Creatinine, Ser 1.02 (H) 0.44 - 1.00 mg/dL   Calcium 9.1 8.9 - 10.3 mg/dL   Total Protein 6.8 6.5 - 8.1 g/dL   Albumin 4.1 3.5 - 5.0 g/dL   AST 48 (H) 15 - 41 U/L   ALT 36 0 - 44 U/L   Alkaline Phosphatase 61 38 - 126 U/L   Total Bilirubin 0.9 0.3 - 1.2 mg/dL   GFR, Estimated 59 (L) >60 mL/min    Comment: (NOTE) Calculated using the CKD-EPI Creatinine Equation (2021)    Anion gap 13 5 - 15    Comment: Performed at [facility], [place] [address] [place], [place] [address]  Lipase, blood     Status: None   Collection Time: 07/28/22  6:21 PM  Result Value Ref Range   Lipase 25 11 - 51 U/L    Comment: Performed at [facility], [place] [address] [place], [place] [address]   CT Head Wo Contrast  Result Date: 07/28/2022 CLINICAL DATA:  Mental status change dizzy EXAM: CT HEAD WITHOUT CONTRAST TECHNIQUE: Contiguous axial images were obtained from the base of the skull through the vertex without intravenous contrast. RADIATION DOSE REDUCTION: This exam was performed according to the departmental dose-optimization program which includes automated exposure control, adjustment of the mA and/or kV according to patient size and/or use of iterative reconstruction technique. COMPARISON:  CT brain 06/28/2010 FINDINGS: Brain: No acute territorial infarction, hemorrhage or intracranial mass. Mild atrophy. Mild white matter hypodensity likely chronic small vessel ischemic change. Nonenlarged ventricles. Vascular: No hyperdense vessels.  Carotid vascular calcification Skull: Normal. Negative for fracture or focal lesion. Sinuses/Orbits: Postsurgical changes of the maxillary sinuses. Mild mucosal thickening Other: None IMPRESSION: 1. No CT evidence for acute intracranial abnormality. 2. Atrophy and mild chronic small vessel ischemic changes of the  white matter. Electronically Signed   By: Donavan Foil [name]   On: 07/28/2022 19:35   CT ABDOMEN PELVIS WO CONTRAST  Result  Date: 07/28/2022 CLINICAL DATA:  Diarrhea EXAM: CT ABDOMEN AND PELVIS WITHOUT CONTRAST TECHNIQUE: Multidetector CT imaging of the abdomen and pelvis was performed following the standard protocol without IV contrast. RADIATION DOSE REDUCTION: This exam was performed according to the departmental dose-optimization program which includes automated exposure control, adjustment of the mA and/or kV according to patient size and/or use of iterative reconstruction technique. COMPARISON:  CT 10/11/2016 FINDINGS: Lower chest: Lung bases demonstrate emphysema. No acute airspace disease. Coronary vascular calcification ascending aortic diameter up to 4.2 cm. Hepatobiliary: Status post cholecystectomy. Increased dilatation of common bile duct measuring up to 2.1 cm, previously 1.2 cm. Pancreas: Unremarkable. No pancreatic ductal dilatation or surrounding inflammatory changes. Spleen: Normal in size without focal abnormality. Adrenals/Urinary Tract: Adrenal glands are normal. Kidneys show no hydronephrosis. Probable cyst midpole left kidney, no imaging follow-up is recommended. The bladder is grossly unremarkable Stomach/Bowel: Stomach nonenlarged. No dilated small bowel. Question mild asymmetric cecal wall thickening or filling defect, series 7, image 74 and 71. Vascular/Lymphatic: Advanced aortic atherosclerosis. No aneurysm. No suspicious lymph nodes. Reproductive: Hysterectomy.  No adnexal mass Other: Negative for pelvic effusion or free air. Musculoskeletal: Old right posterior rib fractures. No acute osseous abnormality IMPRESSION: 1. No definite CT evidence for acute intra-abdominal or pelvic abnormality. 2. Question mild asymmetric cecal wall thickening or intraluminal filling defect/mass. Suggest correlation with direct visualization/colonoscopy. 3. Increased dilatation of common bile duct measuring up to 2.1 cm, previously 1.2 cm. This may be correlated with LFTs with follow-up MRCP as indicated. 4. Mphysema. 5. Aortic  atherosclerosis. Aortic Atherosclerosis (ICD10-I70.0) and Emphysema (ICD10-J43.9). Electronically Signed   By: Donavan Foil [name]   On: 07/28/2022 19:32    Pending Labs Unresulted Labs (From admission, onward)     Start     Ordered   08/04/22 0500  Creatinine, serum  (enoxaparin (LOVENOX)    CrCl >/= 30 ml/min)  Weekly,   R     Comments: while on enoxaparin therapy    07/28/22 2039   07/29/22 0500  CBC with Differential/Platelet  Tomorrow morning,   R        07/28/22 2043   07/29/22 0500  Comprehensive metabolic panel  Tomorrow morning,   R        07/28/22 2043   07/29/22 0500  Magnesium  Tomorrow morning,   R        07/28/22 2043   07/29/22 0500  Phosphorus  Tomorrow morning,   R        07/28/22 2043   07/29/22 0500  HIV Antibody (routine testing w rflx)  (HIV Antibody (Routine testing w reflex) panel)  Once,   R        07/28/22 2049   07/28/22 2036  C Difficile Quick Screen w PCR reflex  (C Difficile quick screen w PCR reflex panel )  Once, for 24 hours,   URGENT       References:    CDiff Information Tool   07/28/22 2035   07/28/22 2011  Gastrointestinal Panel by PCR , Stool  (Gastrointestinal Panel by PCR, Stool                                                                                                                                                     **  Does Not include CLOSTRIDIUM DIFFICILE testing. **If CDIFF testing is needed, place order from the "C Difficile Testing" order set.**)  Once,   URGENT        07/28/22 2011   07/28/22 1737  Urinalysis, Routine w reflex microscopic -Urine, Clean Catch  Once,   URGENT       Question:  Specimen Source  Answer:  Urine, Clean Catch   07/28/22 1737            Vitals/Pain Today's Vitals   07/28/22 2000 07/28/22 2030 07/28/22 2100 07/28/22 2120  BP: (!) 145/89 135/88 138/82   Pulse: 99 72 69   Resp: 14 (!) 23 20   Temp:    97.9 F (36.6 C)  TempSrc:    Oral  SpO2: 97% 93% 99%   Weight:      Height:      PainSc:         Isolation Precautions Enteric precautions (UV disinfection)  Medications Medications  potassium chloride 10 mEq in 100 mL IVPB (10 mEq Intravenous New Bag/Given 07/28/22 2040)  metroNIDAZOLE (FLAGYL) IVPB 500 mg (has no administration in time range)  enoxaparin (LOVENOX) injection 40 mg (has no administration in time range)  prochlorperazine (COMPAZINE) injection 5 mg (has no administration in time range)  cefTRIAXone (ROCEPHIN) 2 g in sodium chloride 0.9 % 100 mL IVPB (has no administration in time range)  0.9 % NaCl with KCl 40 mEq / L  infusion (has no administration in time range)  cefTRIAXone (ROCEPHIN) 1 g in sodium chloride 0.9 % 100 mL IVPB (1 g Intravenous New Bag/Given 07/28/22 2118)  lactated ringers bolus 1,000 mL (0 mLs Intravenous Stopped 07/28/22 1928)  potassium chloride SA (KLOR-CON M) CR tablet 40 mEq (40 mEq Oral Given 07/28/22 1942)    Mobility walks    R Recommendations: See Admitting Provider Note  Report given to:   Additional Notes: A&O x4, NAD noted, VSS.
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
## 2022-07-28 NOTE — Plan of Care (Signed)
Discuss and review plan of care with patient/family  

## 2022-07-28 NOTE — ED Notes (Signed)
Receiving RN Brennan Bailey, RN  has agreed to accept [facility] once pt has arrived to inpatient unit, all questions and concerns address.

## 2022-07-28 NOTE — ED Notes (Signed)
URINE ACCIDENTALLY CLICKED OFF

## 2022-07-28 NOTE — ED Triage Notes (Signed)
Pt presents with c/o dizziness that started today. Pt also reports some diarrhea as well with some mild headaches.

## 2022-07-28 NOTE — H&P (Signed)
History and Physical  [name] [id number] DOB: 12-27-1950 DOA: 07/28/2022  Referring physician: Theotis Burrow  PCP: [facility], [place]  Outpatient Specialists: GI, cardiology. Patient coming from: Home.  Chief Complaint: Diarrhea  HPI: [name] is a 72 y.o. female with medical history significant for lymphocytic colitis, history of right lung adenocarcinoma status post lobectomy in 2021, history of tobacco abuse, COPD, hypertension, hyperlipidemia, migraines, endometriosis, chronic anxiety/depression, thoracic aortic atherosclerosis, ascending thoracic aneurysm, who presented to the [facility] ED with complaints of diarrhea that started yesterday.  Associated with generalized weakness, lightheadedness.  No report of subjective fevers or chills.  She presented to the ED for further evaluation.  In the ED, vital signs were notable for afebrile with no tachycardia.  Lab studies were remarkable for electrolytes abnormalities serum sodium 129, potassium 2.6, creatinine 1.02 from baseline of 0.6, elevated AST 48, WBC 11.8.  CT abdomen pelvis without contrast revealed question mild asymmetric cecal wall thickening or intraluminal filling defect/mass.  Suggest correlation with direct visualization/colonoscopy.  Increased dilatation of common bile duct measuring up to 2.1 cm previously 1.2 cm this may be correlated with LFTs with follow-up MRCP as indicated.  Due to concern for possible intra-abdominal infective process the patient was started on Rocephin and IV Flagyl.  Stool studies were obtained to rule out C. difficile and other pathologies.  Electrolytes replacement was initiated.  TRH, hospitalist service, was asked to admit.  ED Course: Tmax 98.4.  BP 138/82, pulse 69, respiratory 20.  Lab studies remarkable for WBC 11.8, hemoglobin 14.5, platelet count 241.  Serum sodium 129, potassium 2.6, glucose 111, creatinine 1.02 with baseline creatinine 0.60, GFR 59, AST 48.  Review of  Systems: Review of systems as noted in the HPI. All other systems reviewed and are negative.   Past Medical History:  Diagnosis Date   Active smoker    per pt quit 01/ 2021 smoking prior to lung lobectomy 02/ 2021 but started back smoking 06/ 2022 stated 5 cig per day   Asthma    Chronic diarrhea    Complication of anesthesia    hard to wake and ponv   Dyslipidemia    Emphysema/COPD    pulmologist--- dr Erin Fulling;   no oxygen   Endometriosis    Family history of adverse reaction to anesthesia    brother--- slow to wake   Finger fracture, left    left ring and small proximal phalagel fx's   GAD (generalized anxiety disorder)    GERD (gastroesophageal reflux disease)    Hiatal hernia    History of cervical dysplasia    s/p gyn crysurgery yrs ago   History of gastric ulcer 05/2019   post lung lobectomy w/ esophagitis   History of TIA (transient ischemic attack)    12-22-2021  per pt remote TIA > 10 yrs ago,  no residual   Hypertension    followed by cardiologist and pcp;   cardiac cath 11-20-2002 completely normal;   last nuclear stress test 08-14-2010 low risk normal perfusion no ischemia, nuclear ef 59%   Intermittent palpitations    followed by dr hilty   Lymphocytic colitis    followed by [facility]   Macular degeneration of both eyes    Migraine    Non-small cell lung cancer, right 05/2019   surgeon-- dr Roxan Hockey / oncologist-- dr Julien Nordmann  pulmologist-- dr j. Erin Fulling;   05-28-2019  s/p  right upper lobectomy w/ node dissection's,  Stage IA,  no chemo or  radiation   NSVT (nonsustained ventricular tachycardia)    hx bigemy  and psot op wide complex tachycardia s/p lung lobectomy 02/ 2021   OSA (obstructive sleep apnea)    does not wear her CPAP because "it gave me respiratory issues (bronchities and pneumonia)"   Osteoporosis    PONV (postoperative nausea and vomiting)    Secondary polycythemia    evaluation by dr Willene Hatchet note in epic 11-03-2015  per note due to tobacco  use and respiratory issues   Thoracic ascending aortic aneurysm 02/02/2022   Past Surgical History:  Procedure Laterality Date   BIOPSY  06/05/2019   Procedure: BIOPSY;  Surgeon: Carol Ada, MD;  Location: [place];  Service: Endoscopy;;   CARDIAC CATHETERIZATION  11/20/2002   @[facility]  by dr gamble;   completey normal;  patent coronary arteries   CATARACT EXTRACTION W/ INTRAOCULAR LENS IMPLANT Bilateral 08/2020   ESOPHAGOGASTRODUODENOSCOPY Left 06/05/2019   Procedure: ESOPHAGOGASTRODUODENOSCOPY (EGD);  Surgeon: Carol Ada, MD;  Location: [place];  Service: Endoscopy;  Laterality: Left;   GYNECOLOGIC CRYOSURGERY     many yrs ago   INTERCOSTAL NERVE BLOCK Right 05/28/2019   Procedure: Intercostal Nerve Block;  Surgeon: Melrose Nakayama, MD;  Location: [place];  Service: Thoracic;  Laterality: Right;   LAPAROSCOPIC CHOLECYSTECTOMY  10/15/2002   @[facility]    LUMBAR [place] SURGERY  08/15/2000   @[facility]  by dr Sherwood Gambler;   left L3--4   (and also lumbar surgery approx. 1989)   NASAL SINUS SURGERY  2004   approx   NODE DISSECTION  05/28/2019   Procedure: Node Dissection;  Surgeon: Melrose Nakayama, MD;  Location: [place];  Service: Thoracic;;   OPEN REDUCTION INTERNAL FIXATION (ORIF) PROXIMAL PHALANX Left 12/29/2021   Procedure: Left ring and small finger proximal phalangeal closed reduction percutaneous pinning;  Surgeon: Orene Desanctis, MD;  Location: [place];  Service: Orthopedics;  Laterality: Left;   ROTATOR CUFF REPAIR Left    x2  last one  1990s   THORACOSCOPY  05/28/2019   THORASCOPY-WEDGE RESECTION AND  RIGHT  UPPER LOBECTOMY (Right)   THROAT SURGERY  1997   REMOVAL OF TUMOR  (PER PT BENIGN)   TONSILLECTOMY     age 57   VAGINAL HYSTERECTOMY  2000   w/  BILATERAL SALPINGOOPHORECTOMY    Social History:  reports that she has been smoking cigarettes. She has a 42.00 pack-year smoking history. She has never used smokeless tobacco. She reports current alcohol use of  about 15.0 standard drinks of alcohol per week. She reports that she does not use drugs.   Allergies  Allergen Reactions   Iodinated Contrast Media Itching    itching but no hives just after iv contrast injection w/ 13 hr prep, future contrast not advised unless absolutely necessary, 13 hr prep would still be advised.//a.[email or web address]   Ioxaglate Itching    itching but no hives just after iv contrast injection w/ 13 hr prep, future contrast not advised unless absolutely necessary, 13 hr prep would still be advised.//a.calhoun   Omnipaque [Iohexol] Anaphylaxis   Budesonide Nausea And Vomiting    Other reaction(s): flushing, vomiting   Buprenorphine Hcl Nausea And Vomiting   Codeine Nausea And Vomiting   Erythromycin     Pt unsure reaction   Morphine And Related Nausea And Vomiting    Family History  Problem Relation Age of Onset   Hypertension Mother    Cancer Mother        LIVER AND LUNG  Hypertension Brother    Heart disease Brother    Hypertension Maternal Grandfather    Heart disease Maternal Grandfather    Heart attack Maternal Grandfather       Prior to Admission medications   Medication Sig Start Date End Date Taking? Authorizing Provider  ALPRAZolam Duanne Moron) 0.5 MG tablet Take 0.5 mg by mouth 3 (three) times daily as needed for anxiety.    [provider]  amLODipine (NORVASC) 2.5 MG tablet TAKE 1 TABLET BY MOUTH DAILY. 06/23/22   Pixie Casino, MD  aspirin EC 81 MG tablet Take 81 mg by mouth at bedtime.    [provider]  Calcium Citrate-Vitamin D (CALCIUM + D PO) Take 1 tablet by mouth 2 (two) times daily.    [provider]  cholestyramine Lucrezia Starch) 4 GM/DOSE powder Take 4 g by mouth daily.    [provider]  denosumab (PROLIA) 60 MG/ML SOLN injection Inject 60 mg into the skin every 6 (six) months. Administer in upper arm, thigh, or abdomen    [provider]  Doxylamine Succinate, Sleep, (SLEEP-AID PO) Take by mouth at  bedtime as needed.    [provider]  ergocalciferol (VITAMIN D2) 1.25 MG (50000 UT) capsule Take 50,000 Units by mouth once a week. Saturday's    [provider]  eszopiclone 3 MG TABS Take 3 mg by mouth at bedtime as needed. Take immediately before bedtime    [provider]  gabapentin (NEURONTIN) 300 MG capsule TAKE 2 CAPSULES(600 MG) BY MOUTH THREE TIMES DAILY Patient taking differently: Take 600 mg by mouth 3 (three) times daily. TAKE 2 CAPSULES(600 MG) BY MOUTH THREE TIMES DAILY 02/17/21   Melrose Nakayama, MD  loperamide (IMODIUM) 2 MG capsule Take by mouth as needed for diarrhea or loose stools.    [provider]  metoprolol succinate (TOPROL-XL) 25 MG 24 hr tablet TAKE 1 TABLET(25 MG) BY MOUTH DAILY Patient taking differently: Take 25 mg by mouth at bedtime. 11/26/21   Hilty, Nadean Corwin, MD  metoprolol tartrate (LOPRESSOR) 25 MG tablet Take 25 mg by mouth 2 (two) times daily.    [provider]  montelukast (SINGULAIR) 10 MG tablet Take 10 mg by mouth at bedtime.  02/07/17   [provider]  Multiple Vitamins-Minerals (MULTIVITAMIN ADULT EXTRA C) CHEW Chew by mouth at bedtime.    [provider]  Multiple Vitamins-Minerals (PRESERVISION AREDS 2) CAPS Take 2 capsules by mouth at bedtime.    [provider]  OVER THE COUNTER MEDICATION at bedtime. "Our Choice probiotic"    [provider]  OVER THE COUNTER MEDICATION at bedtime as needed. Per pt CBD gummy    [provider]  pantoprazole (PROTONIX) 40 MG tablet Take 1 tablet (40 mg total) by mouth 2 (two) times daily. Patient taking differently: Take 40 mg by mouth 2 (two) times daily. 06/13/19   Barrett, Erin R, PA-C  rosuvastatin (CRESTOR) 5 MG tablet TAKE 1 TABLET(5 MG) BY MOUTH DAILY 12/25/21   Hilty, Nadean Corwin, MD  SPIRIVA RESPIMAT 2.5 MCG/ACT AERS INHALE 2 PUFFS INTO THE LUNGS DAILY 03/05/21   Freddi Starr, MD   telmisartan-hydrochlorothiazide (MICARDIS HCT) 80-12.5 MG tablet TAKE 1 TABLET BY MOUTH DAILY Patient taking differently: Take 1 tablet by mouth daily after lunch. 08/06/21   Hilty, Nadean Corwin, MD  VENTOLIN HFA 108 (90 BASE) MCG/ACT inhaler Inhale 2 puffs into the lungs every 6 (six) hours as needed for wheezing or shortness of breath.  12/13/12   [provider]    Physical Exam: BP (!) 137/95   Pulse 61   Temp 98.4 F (36.9 C) (Oral)   Resp 12   Ht 5\' 4"  (1.626 m)   Wt 54.4 kg   SpO2 94%   BMI 20.60 kg/m   General: 72 y.o. year-old female well developed well nourished in no acute distress.  Alert and oriented x3. Cardiovascular: Regular rate and rhythm with no rubs or gallops.  No thyromegaly or JVD noted.  No lower extremity edema. 2/4 pulses in all 4 extremities. Respiratory: Clear to auscultation with no wheezes or rales. Good inspiratory effort. Abdomen: Soft nontender nondistended with normal bowel sounds x4 quadrants. Muskuloskeletal: No cyanosis, clubbing or edema noted bilaterally Neuro: CN II-XII intact, strength, sensation, reflexes Skin: No ulcerative lesions noted or rashes Psychiatry: Judgement and insight appear normal. Mood is appropriate for condition and setting          Labs on Admission:  Basic Metabolic Panel: Recent Labs  Lab 07/28/22 1821  NA 129*  K 2.6*  CL 87*  CO2 29  GLUCOSE 111*  BUN 11  CREATININE 1.02*  CALCIUM 9.1   Liver Function Tests: Recent Labs  Lab 07/28/22 1821  AST 48*  ALT 36  ALKPHOS 61  BILITOT 0.9  PROT 6.8  ALBUMIN 4.1   Recent Labs  Lab 07/28/22 1821  LIPASE 25   No results for input(s): "AMMONIA" in the last 168 hours. CBC: Recent Labs  Lab 07/28/22 1821  WBC 11.8*  HGB 14.5  HCT 41.6  MCV 97.2  PLT 241   Cardiac Enzymes: No results for input(s): "CKTOTAL", "CKMB", "CKMBINDEX", "TROPONINI" in the last 168 hours.  BNP (last 3 results) No results for input(s): "BNP" in the last 8760  hours.  ProBNP (last 3 results) No results for input(s): "PROBNP" in the last 8760 hours.  CBG: Recent Labs  Lab 07/28/22 1739  GLUCAP 131*    Radiological Exams on Admission: CT Head Wo Contrast  Result Date: 07/28/2022 CLINICAL DATA:  Mental status change dizzy EXAM: CT HEAD WITHOUT CONTRAST TECHNIQUE: Contiguous axial images were obtained from the base of the skull through the vertex without intravenous contrast. RADIATION DOSE REDUCTION: This exam was performed according to the departmental dose-optimization program which includes automated exposure control, adjustment of the mA and/or kV according to patient size and/or use of iterative reconstruction technique. COMPARISON:  CT brain 06/28/2010 FINDINGS: Brain: No acute territorial infarction, hemorrhage or intracranial mass. Mild atrophy. Mild white matter hypodensity likely chronic small vessel ischemic change. Nonenlarged ventricles. Vascular: No hyperdense vessels.  Carotid vascular calcification Skull: Normal. Negative for fracture or focal lesion. Sinuses/Orbits: Postsurgical changes of the maxillary sinuses. Mild mucosal thickening Other: None IMPRESSION: 1. No CT evidence for acute intracranial abnormality. 2. Atrophy and mild chronic small vessel ischemic changes of the white matter. Electronically Signed   By: Donavan Foil [name]   On: 07/28/2022 19:35   CT ABDOMEN PELVIS WO CONTRAST  Result Date: 07/28/2022 CLINICAL DATA:  Diarrhea EXAM: CT ABDOMEN AND PELVIS WITHOUT CONTRAST TECHNIQUE: Multidetector CT imaging of the abdomen and pelvis was performed following the standard protocol without IV contrast. RADIATION DOSE REDUCTION: This exam was performed according to the departmental dose-optimization program which includes automated exposure control, adjustment of the mA and/or kV according to patient size and/or use of iterative reconstruction technique. COMPARISON:  CT 10/11/2016 FINDINGS: Lower chest: Lung bases demonstrate  emphysema. No acute airspace disease. Coronary vascular calcification ascending  aortic diameter up to 4.2 cm. Hepatobiliary: Status post cholecystectomy. Increased dilatation of common bile duct measuring up to 2.1 cm, previously 1.2 cm. Pancreas: Unremarkable. No pancreatic ductal dilatation or surrounding inflammatory changes. Spleen: Normal in size without focal abnormality. Adrenals/Urinary Tract: Adrenal glands are normal. Kidneys show no hydronephrosis. Probable cyst midpole left kidney, no imaging follow-up is recommended. The bladder is grossly unremarkable Stomach/Bowel: Stomach nonenlarged. No dilated small bowel. Question mild asymmetric cecal wall thickening or filling defect, series 7, image 74 and 71. Vascular/Lymphatic: Advanced aortic atherosclerosis. No aneurysm. No suspicious lymph nodes. Reproductive: Hysterectomy.  No adnexal mass Other: Negative for pelvic effusion or free air. Musculoskeletal: Old right posterior rib fractures. No acute osseous abnormality IMPRESSION: 1. No definite CT evidence for acute intra-abdominal or pelvic abnormality. 2. Question mild asymmetric cecal wall thickening or intraluminal filling defect/mass. Suggest correlation with direct visualization/colonoscopy. 3. Increased dilatation of common bile duct measuring up to 2.1 cm, previously 1.2 cm. This may be correlated with LFTs with follow-up MRCP as indicated. 4. Mphysema. 5. Aortic atherosclerosis. Aortic Atherosclerosis (ICD10-I70.0) and Emphysema (ICD10-J43.9). Electronically Signed   By: Donavan Foil [name]   On: 07/28/2022 19:32    EKG: I independently viewed the EKG done and my findings are as followed: Sinus rhythm 85, nonspecific ST-T changes.  QTc 476.  Assessment/Plan Present on Admission:  Diarrhea  Principal Problem:   Diarrhea  Acute diarrhea, rule out active infective process History of lymphocytic colitis Presented with WBC 11.8 Diarrhea started yesterday CT abdomen and pelvis non  revealing Due to concern for possible intra-abdominal infection was started on Rocephin and IV Flagyl. Follow stool studies, C. difficile PCR, GI panel Monitor fever curve and WBCs GI consult  Common bile duct dilatation status postcholecystectomy Elevated AST Common bile duct measuring 2.1 cm from 1.2 cm previously Follow MRCP Repeat CMP in the morning  Hyponatremia, hypovolemic Presented with serum sodium of 129 Continue NS KCl at 75 cc/h x 2 days Increase oral protein calorie intake Repeat CMP in the morning  Acute kidney injury, suspect prerenal in the setting of dehydration from high stool output Baseline creatinine appears to be 0.6 with GFR 26 Presented with creatinine 1.02 with GFR 59. Avoid nephrotoxic agent, dehydration and hypotension Continue IV fluid hydration Monitor urine output Repeat chemistry panel in the morning  Hypokalemia likely secondary to GI losses from diarrhea Presented with potassium level of 2.6 Repleted intravenously Continue NS KCl 40 mill equivalent at 75 cc/h x 2 days Repeat chemistry panel in the morning Obtain magnesium level Continue to replete electrolytes as indicated.  Hypertension Stable Blood pressure is at goal Resume home regimen Monitor vital signs  Polyneuropathy Resume home regimen  Chronic anxiety Resume home regimen  GERD Resume home PPI twice daily  Hyperlipidemia Hold off home Crestor for now     DVT prophylaxis: Subcu Lovenox daily  Code Status: Full code  Family Communication: None at bedside  Disposition Plan: Admitted to telemetry unit  Consults called: GI consulted via epic  Admission status: Inpatient status.   Status is: Inpatient The patient requires at least 2 midnights for further evaluation and treatment of present condition.   Kayleen Memos MD [facility] Pager [phone]  If 7PM-7AM, please contact night-coverage [email or web address] Password [facility]  07/28/2022, 8:39 PM

## 2022-07-29 DIAGNOSIS — R197 Diarrhea, unspecified: Secondary | ICD-10-CM | POA: Diagnosis not present

## 2022-07-29 LAB — C DIFFICILE QUICK SCREEN W PCR REFLEX
C Diff antigen: NEGATIVE
C Diff interpretation: NOT DETECTED
C Diff toxin: NEGATIVE

## 2022-07-29 LAB — COMPREHENSIVE METABOLIC PANEL
ALT: 24 U/L (ref 0–44)
AST: 26 U/L (ref 15–41)
Albumin: 3 g/dL — ABNORMAL LOW (ref 3.5–5.0)
Alkaline Phosphatase: 43 U/L (ref 38–126)
Anion gap: 10 (ref 5–15)
BUN: 7 mg/dL — ABNORMAL LOW (ref 8–23)
CO2: 25 mmol/L (ref 22–32)
Calcium: 7.8 mg/dL — ABNORMAL LOW (ref 8.9–10.3)
Chloride: 97 mmol/L — ABNORMAL LOW (ref 98–111)
Creatinine, Ser: 0.51 mg/dL (ref 0.44–1.00)
GFR, Estimated: >60 mL/min (ref >60)
Glucose, Bld: 84 mg/dL (ref 70–99)
Potassium: 3.2 mmol/L — ABNORMAL LOW (ref 3.5–5.1)
Sodium: 132 mmol/L — ABNORMAL LOW (ref 135–145)
Total Bilirubin: 0.6 mg/dL (ref 0.3–1.2)
Total Protein: 4.9 g/dL — ABNORMAL LOW (ref 6.5–8.1)

## 2022-07-29 LAB — CBC WITH DIFFERENTIAL/PLATELET
Abs Immature Granulocytes: 0.02 10*3/uL (ref 0.00–0.07)
Basophils Absolute: 0 10*3/uL (ref 0.0–0.1)
Basophils Relative: 1 %
Eosinophils Absolute: 0.1 10*3/uL (ref 0.0–0.5)
Eosinophils Relative: 2 %
HCT: 35 % — ABNORMAL LOW (ref 36.0–46.0)
Hemoglobin: 11.9 g/dL — ABNORMAL LOW (ref 12.0–15.0)
Immature Granulocytes: 0 %
Lymphocytes Relative: 36 %
Lymphs Abs: 2.3 10*3/uL (ref 0.7–4.0)
MCH: 33.9 pg (ref 26.0–34.0)
MCHC: 34 g/dL (ref 30.0–36.0)
MCV: 99.7 fL (ref 80.0–100.0)
Monocytes Absolute: 0.5 10*3/uL (ref 0.1–1.0)
Monocytes Relative: 8 %
Neutro Abs: 3.4 10*3/uL (ref 1.7–7.7)
Neutrophils Relative %: 53 %
Platelets: 204 10*3/uL (ref 150–400)
RBC: 3.51 MIL/uL — ABNORMAL LOW (ref 3.87–5.11)
RDW: 13.2 % (ref 11.5–15.5)
WBC: 6.3 10*3/uL (ref 4.0–10.5)
nRBC: 0 % (ref 0.0–0.2)

## 2022-07-29 LAB — HIV ANTIBODY (ROUTINE TESTING W REFLEX): HIV Screen 4th Generation wRfx: NONREACTIVE

## 2022-07-29 LAB — PHOSPHORUS: Phosphorus: 1.9 mg/dL — ABNORMAL LOW (ref 2.5–4.6)

## 2022-07-29 LAB — MAGNESIUM: Magnesium: 1.6 mg/dL — ABNORMAL LOW (ref 1.7–2.4)

## 2022-07-29 MED ORDER — POTASSIUM CHLORIDE 20 MEQ PO PACK
40.0000 meq | PACK | Freq: Once | ORAL | Status: AC
  Administered 2022-07-29: 40 meq via ORAL
  Filled 2022-07-29: qty 2

## 2022-07-29 MED ORDER — METOPROLOL SUCCINATE ER 25 MG PO TB24
25.0000 mg | ORAL_TABLET | Freq: Every day | ORAL | Status: DC
  Administered 2022-07-29: 25 mg via ORAL
  Filled 2022-07-29: qty 1

## 2022-07-29 MED ORDER — AMLODIPINE BESYLATE 5 MG PO TABS
2.5000 mg | ORAL_TABLET | Freq: Every day | ORAL | Status: DC
  Administered 2022-07-29: 2.5 mg via ORAL
  Filled 2022-07-29: qty 1

## 2022-07-29 MED ORDER — MAGNESIUM SULFATE 2 GM/50ML IV SOLN
2.0000 g | Freq: Once | INTRAVENOUS | Status: AC
  Administered 2022-07-29: 2 g via INTRAVENOUS
  Filled 2022-07-29: qty 50

## 2022-07-29 MED ORDER — DENOSUMAB 60 MG/ML ~~LOC~~ SOLN: 60.0000 mg | SUBCUTANEOUS | Status: DC

## 2022-07-29 MED ORDER — POTASSIUM CHLORIDE CRYS ER 20 MEQ PO TBCR: 40.0000 meq | EXTENDED_RELEASE_TABLET | Freq: Once | ORAL | Status: DC

## 2022-07-29 MED ORDER — ZOLPIDEM TARTRATE 5 MG PO TABS
5.0000 mg | ORAL_TABLET | Freq: Every evening | ORAL | Status: DC | PRN
  Filled 2022-07-29: qty 1

## 2022-07-29 MED ORDER — POTASSIUM PHOSPHATES 15 MMOLE/5ML IV SOLN
30.0000 mmol | Freq: Once | INTRAVENOUS | Status: AC
  Administered 2022-07-29: 30 mmol via INTRAVENOUS
  Filled 2022-07-29: qty 10

## 2022-07-29 MED ORDER — ACYCLOVIR 5 % EX OINT
TOPICAL_OINTMENT | Freq: Four times a day (QID) | CUTANEOUS | Status: DC | PRN
  Filled 2022-07-29: qty 15

## 2022-07-29 NOTE — Consult Note (Signed)
Reason for Consult: Diarrhea Referring Physician: [facility]  Threasa Heads HPI: This is a 72 year old female with a PMH of lymphocytic colitis (Dx 2018), GERD, h/o PUD, asthma, COPD, and anxiety admitted for complaints of diarrhea and hypotension.  She stated that her symptoms with diarrhea started several months ago, but with the passage of time her diarrhea worsened.  She noticed having some fecal accidents and there was no warning or fecal urgency.  On the day of admission she reported feeling "weird".  She had a friend take her BP and she was noted to be hypotensive.  In the ER she was hemodynamically stable, but her electrolytes were low:  Sodium 129 and potassium 2.6.  A CT scan of the abdomen was performed and there was no evidence of any intra-abdominal infection, however, there was some mild cecal asymmetry.  Her CBD was also noted to be at 2.1 cm in a cholecystectomy state.  Her diagnosis for lymphocytic colitis was in 2018 when she presented with similar symptoms.  She obtained some control of her symptoms with cholestyramine as she was not able to afford budesonide.  The cost for the steroid with her insurance was around $1000.  Over the years she managed her symptoms with using a combination of cholestyramine and Imodium.  Since being admitted her diarrhea improved and she was not able to submit a stool sample.  She was treated with ceftriaxone and metronidazole, but these medications were just discontinued.  Past Medical History:  Diagnosis Date   Active smoker    per pt quit 01/ 2021 smoking prior to lung lobectomy 02/ 2021 but started back smoking 06/ 2022 stated 5 cig per day   Asthma    Chronic diarrhea    Complication of anesthesia    hard to wake and ponv   Dyslipidemia    Emphysema/COPD    pulmologist--- dr Erin Fulling;   no oxygen   Endometriosis    Family history of adverse reaction to anesthesia    brother--- slow to wake   Finger fracture, left    left ring and  small proximal phalagel fx's   GAD (generalized anxiety disorder)    GERD (gastroesophageal reflux disease)    Hiatal hernia    History of cervical dysplasia    s/p gyn crysurgery yrs ago   History of gastric ulcer 05/2019   post lung lobectomy w/ esophagitis   History of TIA (transient ischemic attack)    12-22-2021  per pt remote TIA > 10 yrs ago,  no residual   Hypertension    followed by cardiologist and pcp;   cardiac cath 11-20-2002 completely normal;   last nuclear stress test 08-14-2010 low risk normal perfusion no ischemia, nuclear ef 59%   Intermittent palpitations    followed by dr hilty   Lymphocytic colitis    followed by [facility]   Macular degeneration of both eyes    Migraine    Non-small cell lung cancer, right 05/2019   surgeon-- dr Roxan Hockey / oncologist-- dr Julien Nordmann  pulmologist-- dr j. Erin Fulling;   05-28-2019  s/p  right upper lobectomy w/ node dissection's,  Stage IA,  no chemo or radiation   NSVT (nonsustained ventricular tachycardia)    hx bigemy  and psot op wide complex tachycardia s/p lung lobectomy 02/ 2021   OSA (obstructive sleep apnea)    does not wear her CPAP because "it gave me respiratory issues (bronchities and pneumonia)"   Osteoporosis    PONV (  postoperative nausea and vomiting)    Secondary polycythemia    evaluation by dr Willene Hatchet note in epic 11-03-2015  per note due to tobacco use and respiratory issues   Thoracic ascending aortic aneurysm 02/02/2022    Past Surgical History:  Procedure Laterality Date   BIOPSY  06/05/2019   Procedure: BIOPSY;  Surgeon: Carol Ada, MD;  Location: [place];  Service: Endoscopy;;   CARDIAC CATHETERIZATION  11/20/2002   @[facility]  by dr gamble;   completey normal;  patent coronary arteries   CATARACT EXTRACTION W/ INTRAOCULAR LENS IMPLANT Bilateral 08/2020   ESOPHAGOGASTRODUODENOSCOPY Left 06/05/2019   Procedure: ESOPHAGOGASTRODUODENOSCOPY (EGD);  Surgeon: Carol Ada, MD;  Location: [place];   Service: Endoscopy;  Laterality: Left;   GYNECOLOGIC CRYOSURGERY     many yrs ago   INTERCOSTAL NERVE BLOCK Right 05/28/2019   Procedure: Intercostal Nerve Block;  Surgeon: Melrose Nakayama, MD;  Location: [place];  Service: Thoracic;  Laterality: Right;   LAPAROSCOPIC CHOLECYSTECTOMY  10/15/2002   @[facility]    LUMBAR [place] SURGERY  08/15/2000   @[facility]  by dr Sherwood Gambler;   left L3--4   (and also lumbar surgery approx. 1989)   NASAL SINUS SURGERY  2004   approx   NODE DISSECTION  05/28/2019   Procedure: Node Dissection;  Surgeon: Melrose Nakayama, MD;  Location: [place];  Service: Thoracic;;   OPEN REDUCTION INTERNAL FIXATION (ORIF) PROXIMAL PHALANX Left 12/29/2021   Procedure: Left ring and small finger proximal phalangeal closed reduction percutaneous pinning;  Surgeon: Orene Desanctis, MD;  Location: [place];  Service: Orthopedics;  Laterality: Left;   ROTATOR CUFF REPAIR Left    x2  last one  1990s   THORACOSCOPY  05/28/2019   THORASCOPY-WEDGE RESECTION AND  RIGHT  UPPER LOBECTOMY (Right)   THROAT SURGERY  1997   REMOVAL OF TUMOR  (PER PT BENIGN)   TONSILLECTOMY     age 60   VAGINAL HYSTERECTOMY  2000   w/  BILATERAL SALPINGOOPHORECTOMY    Family History  Problem Relation Age of Onset   Hypertension Mother    Cancer Mother        LIVER AND LUNG   Hypertension Brother    Heart disease Brother    Hypertension Maternal Grandfather    Heart disease Maternal Grandfather    Heart attack Maternal Grandfather     Social History:  reports that she has been smoking cigarettes. She has a 42.00 pack-year smoking history. She has never used smokeless tobacco. She reports current alcohol use of about 15.0 standard drinks of alcohol per week. She reports that she does not use drugs.  Allergies:  Allergies  Allergen Reactions   Iodinated Contrast Media Itching and Other (See Comments)    itching but no hives just after iv contrast injection w/ 13 hr prep, future contrast not  advised unless absolutely necessary, 13 hr prep would still be advised.//a.[email or web address]   Ioxaglate Itching and Other (See Comments)    itching but no hives just after iv contrast injection w/ 13 hr prep, future contrast not advised unless absolutely necessary, 13 hr prep would still be advised.//a.[email or web address]   Omnipaque [Iohexol] Anaphylaxis   Budesonide Nausea And Vomiting    Other reaction(s): flushing, vomiting   Buprenorphine Hcl Nausea And Vomiting   Codeine Nausea And Vomiting   Erythromycin     Hospitalized foir 5 days after receiving too much   Morphine And Related Nausea And Vomiting   Nicoderm [Nicotine] Itching    Patches  cause itching   Oyster Extract Nausea And Vomiting and Other (See Comments)    "I went into shock"    Medications: Scheduled:  amLODipine  2.5 mg Oral Daily   cholestyramine light  4 g Oral Daily   enoxaparin (LOVENOX) injection  40 mg Subcutaneous Q24H   gabapentin  600 mg Oral TID   metoprolol succinate  25 mg Oral Daily   montelukast  10 mg Oral QHS   multivitamin with minerals  1 tablet Oral QHS   pantoprazole  40 mg Oral BID   umeclidinium bromide  1 puff Inhalation Daily   Continuous:  0.9 % NaCl with KCl 40 mEq / L 75 mL/hr at 07/29/22 1540   potassium PHOSPHATE IVPB (in mmol) 30 mmol (07/29/22 1223)    Results for orders placed or performed during the hospital encounter of 07/28/22 (from the past 24 hour(s))  CBC     Status: Abnormal   Collection Time: 07/28/22  6:21 PM  Result Value Ref Range   WBC 11.8 (H) 4.0 - 10.5 K/uL   RBC 4.28 3.87 - 5.11 MIL/uL   Hemoglobin 14.5 12.0 - 15.0 g/dL   HCT 41.6 36.0 - 46.0 %   MCV 97.2 80.0 - 100.0 fL   MCH 33.9 26.0 - 34.0 pg   MCHC 34.9 30.0 - 36.0 g/dL   RDW 13.1 11.5 - 15.5 %   Platelets 241 150 - 400 K/uL   nRBC 0.0 0.0 - 0.2 %  Comprehensive metabolic panel     Status: Abnormal   Collection Time: 07/28/22  6:21 PM  Result Value Ref Range   Sodium 129 (L) 135 - 145 mmol/L   Potassium 2.6 (LL)  3.5 - 5.1 mmol/L   Chloride 87 (L) 98 - 111 mmol/L   CO2 29 22 - 32 mmol/L   Glucose, Bld 111 (H) 70 - 99 mg/dL   BUN 11 8 - 23 mg/dL   Creatinine, Ser 1.02 (H) 0.44 - 1.00 mg/dL   Calcium 9.1 8.9 - 10.3 mg/dL   Total Protein 6.8 6.5 - 8.1 g/dL   Albumin 4.1 3.5 - 5.0 g/dL   AST 48 (H) 15 - 41 U/L   ALT 36 0 - 44 U/L   Alkaline Phosphatase 61 38 - 126 U/L   Total Bilirubin 0.9 0.3 - 1.2 mg/dL   GFR, Estimated 59 (L) >60 mL/min   Anion gap 13 5 - 15  Lipase, blood     Status: None   Collection Time: 07/28/22  6:21 PM  Result Value Ref Range   Lipase 25 11 - 51 U/L  CBC with Differential/Platelet     Status: Abnormal   Collection Time: 07/29/22  6:09 AM  Result Value Ref Range   WBC 6.3 4.0 - 10.5 K/uL   RBC 3.51 (L) 3.87 - 5.11 MIL/uL   Hemoglobin 11.9 (L) 12.0 - 15.0 g/dL   HCT 35.0 (L) 36.0 - 46.0 %   MCV 99.7 80.0 - 100.0 fL   MCH 33.9 26.0 - 34.0 pg   MCHC 34.0 30.0 - 36.0 g/dL   RDW 13.2 11.5 - 15.5 %   Platelets 204 150 - 400 K/uL   nRBC 0.0 0.0 - 0.2 %   Neutrophils Relative % 53 %   Neutro Abs 3.4 1.7 - 7.7 K/uL   Lymphocytes Relative 36 %   Lymphs Abs 2.3 0.7 - 4.0 K/uL   Monocytes Relative 8 %   Monocytes Absolute 0.5 0.1 - 1.0 K/uL  Eosinophils Relative 2 %   Eosinophils Absolute 0.1 0.0 - 0.5 K/uL   Basophils Relative 1 %   Basophils Absolute 0.0 0.0 - 0.1 K/uL   Immature Granulocytes 0 %   Abs Immature Granulocytes 0.02 0.00 - 0.07 K/uL  Comprehensive metabolic panel     Status: Abnormal   Collection Time: 07/29/22  6:09 AM  Result Value Ref Range   Sodium 132 (L) 135 - 145 mmol/L   Potassium 3.2 (L) 3.5 - 5.1 mmol/L   Chloride 97 (L) 98 - 111 mmol/L   CO2 25 22 - 32 mmol/L   Glucose, Bld 84 70 - 99 mg/dL   BUN 7 (L) 8 - 23 mg/dL   Creatinine, Ser 0.51 0.44 - 1.00 mg/dL   Calcium 7.8 (L) 8.9 - 10.3 mg/dL   Total Protein 4.9 (L) 6.5 - 8.1 g/dL   Albumin 3.0 (L) 3.5 - 5.0 g/dL   AST 26 15 - 41 U/L   ALT 24 0 - 44 U/L   Alkaline Phosphatase 43 38 -  126 U/L   Total Bilirubin 0.6 0.3 - 1.2 mg/dL   GFR, Estimated >60 >60 mL/min   Anion gap 10 5 - 15  Magnesium     Status: Abnormal   Collection Time: 07/29/22  6:09 AM  Result Value Ref Range   Magnesium 1.6 (L) 1.7 - 2.4 mg/dL  Phosphorus     Status: Abnormal   Collection Time: 07/29/22  6:09 AM  Result Value Ref Range   Phosphorus 1.9 (L) 2.5 - 4.6 mg/dL  HIV Antibody (routine testing w rflx)     Status: None   Collection Time: 07/29/22  6:09 AM  Result Value Ref Range   HIV Screen 4th Generation wRfx Non Reactive Non Reactive     MR 3D Recon At Scanner  Result Date: 07/29/2022 CLINICAL DATA:  72 year old female with suspected biliary obstruction. EXAM: MRI ABDOMEN WITHOUT CONTRAST  (INCLUDING MRCP) TECHNIQUE: Multiplanar multisequence MR imaging of the abdomen was performed. Heavily T2-weighted images of the biliary and pancreatic ducts were obtained, and three-dimensional MRCP images were rendered by post processing. COMPARISON:  Abdominal MRI 11/26/2016. FINDINGS: Comment: Today's study is limited for detection and characterization of visceral and/or vascular lesions by lack of IV gadolinium. Lower chest: Unremarkable. Hepatobiliary: Mild diffuse loss of signal intensity throughout the hepatic parenchyma on out of phase dual echo images indicative of a background of hepatic steatosis. In the periphery of the right lobe of the liver between segments 4B and 5 (axial image 16 of series 4 and coronal image 11 of series 5) there is a 9 x 5 x 17 mm T1 hypointense, T2 hyperintense lesion which is incompletely characterized on today's noncontrast examination, but was previously characterized as a cavernous hemangioma. No other definite new suspicious appearing hepatic lesions are noted on today's noncontrast examination. Moderate intrahepatic and severe extrahepatic biliary ductal dilatation is noted, with the proximal common bile duct measuring up to 19 mm in diameter. This has minimally increased  compared to the prior study. No filling defect within the common bile duct noted on MRCP images to suggest the presence of choledocholithiasis. The duct is dilated all the way to the level of the ampulla. No definite ampullary mass confidently identified on today's noncontrast examination. Findings are favored to reflect post cholecystectomy physiology with reservoir effect. Pancreas: No definite pancreatic mass confidently identified on today's noncontrast examination. No pancreatic ductal dilatation noted on MRCP images. No peripancreatic fluid collections or inflammatory changes.  Spleen:  Unremarkable. Adrenals/Urinary Tract: There are T1 hypointense and T2 hyperintense lesions in both kidneys, similar to the prior examination, incompletely characterized on today's study, but previously characterized as simple cysts (Bosniak class 1) which requiring no imaging follow-up, the largest of which is in the interpolar region of the left kidney measuring 2 cm on today's examination. No definite suspicious renal lesions otherwise noted. No hydroureteronephrosis in the visualized portions of the abdomen. Bilateral adrenal glands are normal in appearance. Stomach/Bowel: Visualized portions are unremarkable. Vascular/Lymphatic: No aneurysm identified in the visualized abdominal vasculature. No lymphadenopathy noted in the abdomen. Other: No significant volume of ascites noted in the visualized portions of the peritoneal cavity. Musculoskeletal: No aggressive appearing osseous lesions are noted in the visualized portions of the skeleton. IMPRESSION: 1. Although there is substantial intra and extrahepatic biliary ductal dilatation, this is only minimally increased compared to the prior study, and is presumably reflective of post cholecystectomy physiology with reservoir effect. No source of obstruction identified on today's examination. 2. Mild hepatic steatosis. 3. Additional incidental findings, similar to the prior study,  as above. Electronically Signed   By: Vinnie Langton [name]   On: 07/29/2022 08:18   MR ABDOMEN MRCP WO CONTRAST  Result Date: 07/29/2022 CLINICAL DATA:  72 year old female with suspected biliary obstruction. EXAM: MRI ABDOMEN WITHOUT CONTRAST  (INCLUDING MRCP) TECHNIQUE: Multiplanar multisequence MR imaging of the abdomen was performed. Heavily T2-weighted images of the biliary and pancreatic ducts were obtained, and three-dimensional MRCP images were rendered by post processing. COMPARISON:  Abdominal MRI 11/26/2016. FINDINGS: Comment: Today's study is limited for detection and characterization of visceral and/or vascular lesions by lack of IV gadolinium. Lower chest: Unremarkable. Hepatobiliary: Mild diffuse loss of signal intensity throughout the hepatic parenchyma on out of phase dual echo images indicative of a background of hepatic steatosis. In the periphery of the right lobe of the liver between segments 4B and 5 (axial image 16 of series 4 and coronal image 11 of series 5) there is a 9 x 5 x 17 mm T1 hypointense, T2 hyperintense lesion which is incompletely characterized on today's noncontrast examination, but was previously characterized as a cavernous hemangioma. No other definite new suspicious appearing hepatic lesions are noted on today's noncontrast examination. Moderate intrahepatic and severe extrahepatic biliary ductal dilatation is noted, with the proximal common bile duct measuring up to 19 mm in diameter. This has minimally increased compared to the prior study. No filling defect within the common bile duct noted on MRCP images to suggest the presence of choledocholithiasis. The duct is dilated all the way to the level of the ampulla. No definite ampullary mass confidently identified on today's noncontrast examination. Findings are favored to reflect post cholecystectomy physiology with reservoir effect. Pancreas: No definite pancreatic mass confidently identified on today's noncontrast  examination. No pancreatic ductal dilatation noted on MRCP images. No peripancreatic fluid collections or inflammatory changes. Spleen:  Unremarkable. Adrenals/Urinary Tract: There are T1 hypointense and T2 hyperintense lesions in both kidneys, similar to the prior examination, incompletely characterized on today's study, but previously characterized as simple cysts (Bosniak class 1) which requiring no imaging follow-up, the largest of which is in the interpolar region of the left kidney measuring 2 cm on today's examination. No definite suspicious renal lesions otherwise noted. No hydroureteronephrosis in the visualized portions of the abdomen. Bilateral adrenal glands are normal in appearance. Stomach/Bowel: Visualized portions are unremarkable. Vascular/Lymphatic: No aneurysm identified in the visualized abdominal vasculature. No lymphadenopathy noted in the abdomen. Other:  No significant volume of ascites noted in the visualized portions of the peritoneal cavity. Musculoskeletal: No aggressive appearing osseous lesions are noted in the visualized portions of the skeleton. IMPRESSION: 1. Although there is substantial intra and extrahepatic biliary ductal dilatation, this is only minimally increased compared to the prior study, and is presumably reflective of post cholecystectomy physiology with reservoir effect. No source of obstruction identified on today's examination. 2. Mild hepatic steatosis. 3. Additional incidental findings, similar to the prior study, as above. Electronically Signed   By: Vinnie Langton [name]   On: 07/29/2022 05:31   CT Head Wo Contrast  Result Date: 07/28/2022 CLINICAL DATA:  Mental status change dizzy EXAM: CT HEAD WITHOUT CONTRAST TECHNIQUE: Contiguous axial images were obtained from the base of the skull through the vertex without intravenous contrast. RADIATION DOSE REDUCTION: This exam was performed according to the departmental dose-optimization program which includes automated  exposure control, adjustment of the mA and/or kV according to patient size and/or use of iterative reconstruction technique. COMPARISON:  CT brain 06/28/2010 FINDINGS: Brain: No acute territorial infarction, hemorrhage or intracranial mass. Mild atrophy. Mild white matter hypodensity likely chronic small vessel ischemic change. Nonenlarged ventricles. Vascular: No hyperdense vessels.  Carotid vascular calcification Skull: Normal. Negative for fracture or focal lesion. Sinuses/Orbits: Postsurgical changes of the maxillary sinuses. Mild mucosal thickening Other: None IMPRESSION: 1. No CT evidence for acute intracranial abnormality. 2. Atrophy and mild chronic small vessel ischemic changes of the white matter. Electronically Signed   By: Donavan Foil [name]   On: 07/28/2022 19:35   CT ABDOMEN PELVIS WO CONTRAST  Result Date: 07/28/2022 CLINICAL DATA:  Diarrhea EXAM: CT ABDOMEN AND PELVIS WITHOUT CONTRAST TECHNIQUE: Multidetector CT imaging of the abdomen and pelvis was performed following the standard protocol without IV contrast. RADIATION DOSE REDUCTION: This exam was performed according to the departmental dose-optimization program which includes automated exposure control, adjustment of the mA and/or kV according to patient size and/or use of iterative reconstruction technique. COMPARISON:  CT 10/11/2016 FINDINGS: Lower chest: Lung bases demonstrate emphysema. No acute airspace disease. Coronary vascular calcification ascending aortic diameter up to 4.2 cm. Hepatobiliary: Status post cholecystectomy. Increased dilatation of common bile duct measuring up to 2.1 cm, previously 1.2 cm. Pancreas: Unremarkable. No pancreatic ductal dilatation or surrounding inflammatory changes. Spleen: Normal in size without focal abnormality. Adrenals/Urinary Tract: Adrenal glands are normal. Kidneys show no hydronephrosis. Probable cyst midpole left kidney, no imaging follow-up is recommended. The bladder is grossly unremarkable  Stomach/Bowel: Stomach nonenlarged. No dilated small bowel. Question mild asymmetric cecal wall thickening or filling defect, series 7, image 74 and 71. Vascular/Lymphatic: Advanced aortic atherosclerosis. No aneurysm. No suspicious lymph nodes. Reproductive: Hysterectomy.  No adnexal mass Other: Negative for pelvic effusion or free air. Musculoskeletal: Old right posterior rib fractures. No acute osseous abnormality IMPRESSION: 1. No definite CT evidence for acute intra-abdominal or pelvic abnormality. 2. Question mild asymmetric cecal wall thickening or intraluminal filling defect/mass. Suggest correlation with direct visualization/colonoscopy. 3. Increased dilatation of common bile duct measuring up to 2.1 cm, previously 1.2 cm. This may be correlated with LFTs with follow-up MRCP as indicated. 4. Mphysema. 5. Aortic atherosclerosis. Aortic Atherosclerosis (ICD10-I70.0) and Emphysema (ICD10-J43.9). Electronically Signed   By: Donavan Foil [name]   On: 07/28/2022 19:32    ROS:  As stated above in the HPI otherwise negative.  Blood pressure 125/88, pulse 75, temperature (!) 97.5 F (36.4 C), resp. rate 20, height 5\' 4"  (1.626 m), weight 59.2 kg, SpO2 94 %.  PE: Gen: NAD, Alert and Oriented HEENT:  [place]/AT, EOMI Neck: Supple, no LAD Lungs: CTA Bilaterally CV: RRR without M/G/R ABD: Soft, NTND, +BS Ext: No C/C/E  Assessment/Plan: 1) Diarrhea. 2) History of lymphocytic colitis. 3) Hypokalemia. 4) Hyponatremia.   Clinically she is stable and improved since being hospitalized.  It is not clear if she has an infectious component to her diarrhea, but she clearly has a history of lymphocytic colitis.  She feels as if she is having a flare of her colitis as her symptoms are reminiscent of her initial presentation with the disease.  The patient believes that her diarrhea improved as she was placed on an NPO status.  An MRCP was performed to follow up on the dilated biliary tract, but in comparison to her  prior imaging there was no change.  It was felt that the dilation was secondary to a reservoir effect.  As for the cecal asymmetry, this is most likely an artifact, but this can be followed up with a repeat colonoscopy on an outpatient basis.  Plan: 1) Advance to a regular diet. 2) If she has diarrhea, she will be started on budesonide 9 mg QD.  Itzayana Pardy D 07/29/2022, 6:04 PM

## 2022-07-29 NOTE — Progress Notes (Signed)
PROGRESS NOTE    [name]  [id number] DOB: Dec 27, 1950 DOA: 07/28/2022 PCP: Holland Commons, FNP   Brief Narrative:  HPI: [name] is a 72 y.o. female with medical history significant for lymphocytic colitis, history of right lung adenocarcinoma status post lobectomy in 2021, history of tobacco abuse, COPD, hypertension, hyperlipidemia, migraines, endometriosis, chronic anxiety/depression, thoracic aortic atherosclerosis, ascending thoracic aneurysm, who presented to [facility] ED with complaints of diarrhea that started yesterday.  Associated with generalized weakness, lightheadedness.  No report of subjective fevers or chills.  She presented to the ED for further evaluation.   In the ED, vital signs were stable, afebrile with no tachycardia.  Lab studies were remarkable for electrolytes abnormalities serum sodium 129, potassium 2.6, creatinine 1.02 from baseline of 0.6, elevated AST 48, WBC 11.8.  CT abdomen pelvis without contrast revealed question mild asymmetric cecal wall thickening or intraluminal filling defect/mass.  Suggest correlation with direct visualization/colonoscopy.  Increased dilatation of common bile duct measuring up to 2.1 cm previously 1.2 cm this may be correlated with LFTs with follow-up MRCP as indicated.   Due to concern for possible intra-abdominal infective process the patient was started on Rocephin and IV Flagyl.  Stool studies were obtained to rule out C. difficile and other pathologies.  Electrolytes replacement was initiated.   TRH, hospitalist service, was asked to admit.   ED Course: Tmax 98.4.  BP 138/82, pulse 69, respiratory 20.  Lab studies remarkable for WBC 11.8, hemoglobin 14.5, platelet count 241.  Serum sodium 129, potassium 2.6, glucose 111, creatinine 1.02 with baseline creatinine 0.60, GFR 59, AST 48.  Assessment & Plan:   Principal Problem:   Diarrhea  Acute diarrhea with history of lymphocytic colitis Presented with WBC  11.8 Diarrhea started yesterday CT abdomen and pelvis shows questionable mild asymmetric cecal wall thickening or intraluminal mass.  Direct visualization with colonoscopy is recommended. Due to concern for possible intra-abdominal infection was started on Rocephin and IV Flagyl.  However, I personally do not suspect an infection, other than possibility of C. difficile, which has been ordered but stool sample has not been collected yet.  Patient has had 1 episode of diarrhea since admission.  I will discontinue antibiotics.  Follow GI pathogen panel and C. difficile.  For some reason, [facility] was consulted by admitting hospitalist.  Patient has established relationship with Dr. Benson Norway.  She says that her last colonoscopy was almost 6 years ago.  I have called and left a voicemail to Dr. Ulyses Amor cell phone and I am waiting for his call back.  Patient may benefit from colonoscopy.  I have started her on clear liquid diet for now.   Common bile duct dilatation status postcholecystectomy Elevated AST Based on the initial CT abdomen and pelvis, Common bile duct measuring 2.1 cm from 1.2 cm previously, follow-up MRCP shows very mild dilatation, which is insignificant.  AST also improved.  Hyponatremia, hypovolemic Presented with serum sodium of 129, received IV fluids, improving and 132 today.  Acute kidney injury, suspect prerenal in the setting of dehydration from high stool output Baseline creatinine appears to be 0.6, Presented with creatinine 1.02 which improved with IV fluids.  Hypokalemia likely secondary to GI losses from diarrhea Presented with potassium level of 2.6, currently 3.2.  Will replace further.   Essential, hypertension On Micardis, amlodipine and Toprol-XL at home.  Blood pressure fairly controlled, continue amlodipine and Toprol-XL but hold Micardis for now.    Polyneuropathy Resume home regimen  Chronic anxiety Continue home regimen   GERD Resume home PPI twice daily    Hyperlipidemia Hold off home Crestor for now  DVT prophylaxis: enoxaparin (LOVENOX) injection 40 mg Start: 07/28/22 2200   Code Status: Full Code  Family Communication:  None present at bedside.  Plan of care discussed with patient in length and he/she verbalized understanding and agreed with it.  Status is: Inpatient Remains inpatient appropriate because: Patient is still symptomatic, also needs GI evaluation and possible colonoscopy.   Estimated body mass index is 22.4 kg/m as calculated from the following:   Height as of this encounter: 5\' 4"  (1.626 m).   Weight as of this encounter: 59.2 kg.    Nutritional Assessment: Body mass index is 22.4 kg/m.Marland Kitchen Seen by dietician.  I agree with the assessment and plan as outlined below: Nutrition Status:        . Skin Assessment: I have examined the patient's skin and I agree with the wound assessment as performed by the wound care RN as outlined below:    Consultants:  GI-I have called and left voicemail to Dr. Benson Norway.  Procedures:  None  Antimicrobials:  Anti-infectives (From admission, onward)    Start     Dose/Rate Route Frequency Ordered Stop   07/29/22 2000  cefTRIAXone (ROCEPHIN) 2 g in sodium chloride 0.9 % 100 mL IVPB        2 g 200 mL/hr over 30 Minutes Intravenous Every 24 hours 07/28/22 2042     07/28/22 2315  metroNIDAZOLE (FLAGYL) IVPB 500 mg        500 mg 100 mL/hr over 60 Minutes Intravenous Every 12 hours 07/28/22 2225     07/28/22 2200  cefTRIAXone (ROCEPHIN) 1 g in sodium chloride 0.9 % 100 mL IVPB        1 g 200 mL/hr over 30 Minutes Intravenous  Once 07/28/22 2046 07/28/22 2148   07/28/22 2015  cefTRIAXone (ROCEPHIN) 1 g in sodium chloride 0.9 % 100 mL IVPB  Status:  Discontinued        1 g 200 mL/hr over 30 Minutes Intravenous  Once 07/28/22 2011 07/28/22 2114   07/28/22 2015  metroNIDAZOLE (FLAGYL) IVPB 500 mg  Status:  Discontinued        500 mg 100 mL/hr over 60 Minutes Intravenous Every 12 hours  07/28/22 2011 07/28/22 2225         Subjective: Seen and examined.  Still complains of some abdominal pain but has had only 1 episode of diarrhea since last 12 hours.  Her major complaint is not being able to sleep last night.  Objective: Vitals:   07/29/22 0302 07/29/22 0330 07/29/22 0651 07/29/22 1048  BP:  138/86 131/78 (!) 136/95  Pulse:  81 66 66  Resp:  20 14 20   Temp:  98 F (36.7 C) (!) 97.5 F (36.4 C)   TempSrc:      SpO2: 96%  96% 92%  Weight:   59.2 kg   Height:        Intake/Output Summary (Last 24 hours) at 07/29/2022 1116 Last data filed at 07/29/2022 0915 Gross per 24 hour  Intake 1751.19 ml  Output 400 ml  Net 1351.19 ml   Filed Weights   07/28/22 1751 07/29/22 0651  Weight: 54.4 kg 59.2 kg    Examination:  General exam: Appears calm and comfortable  Respiratory system: Clear to auscultation. Respiratory effort normal. Cardiovascular system: S1 & S2 heard, RRR. No JVD, murmurs, rubs, gallops or clicks.  No pedal edema. Gastrointestinal system: Abdomen is nondistended, soft and nontender. No organomegaly or masses felt. Normal bowel sounds heard. Central nervous system: Alert and oriented. No focal neurological deficits. Extremities: Symmetric 5 x 5 power. Skin: No rashes, lesions or ulcers Psychiatry: Judgement and insight appear normal. Mood & affect appropriate.    Data Reviewed: I have personally reviewed following labs and imaging studies  CBC: Recent Labs  Lab 07/28/22 1821 07/29/22 0609  WBC 11.8* 6.3  NEUTROABS  --  3.4  HGB 14.5 11.9*  HCT 41.6 35.0*  MCV 97.2 99.7  PLT 241 0000000   Basic Metabolic Panel: Recent Labs  Lab 07/28/22 1821 07/29/22 0609  NA 129* 132*  K 2.6* 3.2*  CL 87* 97*  CO2 29 25  GLUCOSE 111* 84  BUN 11 7*  CREATININE 1.02* 0.51  CALCIUM 9.1 7.8*  MG  --  1.6*  PHOS  --  1.9*   GFR: Estimated Creatinine Clearance: 56.5 mL/min (by C-G formula based on SCr of 0.51 mg/dL). Liver Function Tests: Recent  Labs  Lab 07/28/22 1821 07/29/22 0609  AST 48* 26  ALT 36 24  ALKPHOS 61 43  BILITOT 0.9 0.6  PROT 6.8 4.9*  ALBUMIN 4.1 3.0*   Recent Labs  Lab 07/28/22 1821  LIPASE 25   No results for input(s): "AMMONIA" in the last 168 hours. Coagulation Profile: No results for input(s): "INR", "PROTIME" in the last 168 hours. Cardiac Enzymes: No results for input(s): "CKTOTAL", "CKMB", "CKMBINDEX", "TROPONINI" in the last 168 hours. BNP (last 3 results) No results for input(s): "PROBNP" in the last 8760 hours. HbA1C: No results for input(s): "HGBA1C" in the last 72 hours. CBG: Recent Labs  Lab 07/28/22 1739  GLUCAP 131*   Lipid Profile: No results for input(s): "CHOL", "HDL", "LDLCALC", "TRIG", "CHOLHDL", "LDLDIRECT" in the last 72 hours. Thyroid Function Tests: No results for input(s): "TSH", "T4TOTAL", "FREET4", "T3FREE", "THYROIDAB" in the last 72 hours. Anemia Panel: No results for input(s): "VITAMINB12", "FOLATE", "FERRITIN", "TIBC", "IRON", "RETICCTPCT" in the last 72 hours. Sepsis Labs: No results for input(s): "PROCALCITON", "LATICACIDVEN" in the last 168 hours.  No results found for this or any previous visit (from the past 240 hour(s)).   Radiology Studies: MR ABDOMEN MRCP WO CONTRAST  Result Date: 07/29/2022 CLINICAL DATA:  72 year old female with suspected biliary obstruction. EXAM: MRI ABDOMEN WITHOUT CONTRAST  (INCLUDING MRCP) TECHNIQUE: Multiplanar multisequence MR imaging of the abdomen was performed. Heavily T2-weighted images of the biliary and pancreatic ducts were obtained, and three-dimensional MRCP images were rendered by post processing. COMPARISON:  Abdominal MRI 11/26/2016. FINDINGS: Comment: Today's study is limited for detection and characterization of visceral and/or vascular lesions by lack of IV gadolinium. Lower chest: Unremarkable. Hepatobiliary: Mild diffuse loss of signal intensity throughout the hepatic parenchyma on out of phase dual echo images  indicative of a background of hepatic steatosis. In the periphery of the right lobe of the liver between segments 4B and 5 (axial image 16 of series 4 and coronal image 11 of series 5) there is a 9 x 5 x 17 mm T1 hypointense, T2 hyperintense lesion which is incompletely characterized on today's noncontrast examination, but was previously characterized as a cavernous hemangioma. No other definite new suspicious appearing hepatic lesions are noted on today's noncontrast examination. Moderate intrahepatic and severe extrahepatic biliary ductal dilatation is noted, with the proximal common bile duct measuring up to 19 mm in diameter. This has minimally increased compared to the prior study. No filling defect  within the common bile duct noted on MRCP images to suggest the presence of choledocholithiasis. The duct is dilated all the way to the level of the ampulla. No definite ampullary mass confidently identified on today's noncontrast examination. Findings are favored to reflect post cholecystectomy physiology with reservoir effect. Pancreas: No definite pancreatic mass confidently identified on today's noncontrast examination. No pancreatic ductal dilatation noted on MRCP images. No peripancreatic fluid collections or inflammatory changes. Spleen:  Unremarkable. Adrenals/Urinary Tract: There are T1 hypointense and T2 hyperintense lesions in both kidneys, similar to the prior examination, incompletely characterized on today's study, but previously characterized as simple cysts (Bosniak class 1) which requiring no imaging follow-up, the largest of which is in the interpolar region of the left kidney measuring 2 cm on today's examination. No definite suspicious renal lesions otherwise noted. No hydroureteronephrosis in the visualized portions of the abdomen. Bilateral adrenal glands are normal in appearance. Stomach/Bowel: Visualized portions are unremarkable. Vascular/Lymphatic: No aneurysm identified in the visualized  abdominal vasculature. No lymphadenopathy noted in the abdomen. Other: No significant volume of ascites noted in the visualized portions of the peritoneal cavity. Musculoskeletal: No aggressive appearing osseous lesions are noted in the visualized portions of the skeleton. IMPRESSION: 1. Although there is substantial intra and extrahepatic biliary ductal dilatation, this is only minimally increased compared to the prior study, and is presumably reflective of post cholecystectomy physiology with reservoir effect. No source of obstruction identified on today's examination. 2. Mild hepatic steatosis. 3. Additional incidental findings, similar to the prior study, as above. Electronically Signed   By: Vinnie Langton [name]   On: 07/29/2022 05:31   CT Head Wo Contrast  Result Date: 07/28/2022 CLINICAL DATA:  Mental status change dizzy EXAM: CT HEAD WITHOUT CONTRAST TECHNIQUE: Contiguous axial images were obtained from the base of the skull through the vertex without intravenous contrast. RADIATION DOSE REDUCTION: This exam was performed according to the departmental dose-optimization program which includes automated exposure control, adjustment of the mA and/or kV according to patient size and/or use of iterative reconstruction technique. COMPARISON:  CT brain 06/28/2010 FINDINGS: Brain: No acute territorial infarction, hemorrhage or intracranial mass. Mild atrophy. Mild white matter hypodensity likely chronic small vessel ischemic change. Nonenlarged ventricles. Vascular: No hyperdense vessels.  Carotid vascular calcification Skull: Normal. Negative for fracture or focal lesion. Sinuses/Orbits: Postsurgical changes of the maxillary sinuses. Mild mucosal thickening Other: None IMPRESSION: 1. No CT evidence for acute intracranial abnormality. 2. Atrophy and mild chronic small vessel ischemic changes of the white matter. Electronically Signed   By: Donavan Foil [name]   On: 07/28/2022 19:35   CT ABDOMEN PELVIS WO  CONTRAST  Result Date: 07/28/2022 CLINICAL DATA:  Diarrhea EXAM: CT ABDOMEN AND PELVIS WITHOUT CONTRAST TECHNIQUE: Multidetector CT imaging of the abdomen and pelvis was performed following the standard protocol without IV contrast. RADIATION DOSE REDUCTION: This exam was performed according to the departmental dose-optimization program which includes automated exposure control, adjustment of the mA and/or kV according to patient size and/or use of iterative reconstruction technique. COMPARISON:  CT 10/11/2016 FINDINGS: Lower chest: Lung bases demonstrate emphysema. No acute airspace disease. Coronary vascular calcification ascending aortic diameter up to 4.2 cm. Hepatobiliary: Status post cholecystectomy. Increased dilatation of common bile duct measuring up to 2.1 cm, previously 1.2 cm. Pancreas: Unremarkable. No pancreatic ductal dilatation or surrounding inflammatory changes. Spleen: Normal in size without focal abnormality. Adrenals/Urinary Tract: Adrenal glands are normal. Kidneys show no hydronephrosis. Probable cyst midpole left kidney, no imaging follow-up is recommended.  The bladder is grossly unremarkable Stomach/Bowel: Stomach nonenlarged. No dilated small bowel. Question mild asymmetric cecal wall thickening or filling defect, series 7, image 74 and 71. Vascular/Lymphatic: Advanced aortic atherosclerosis. No aneurysm. No suspicious lymph nodes. Reproductive: Hysterectomy.  No adnexal mass Other: Negative for pelvic effusion or free air. Musculoskeletal: Old right posterior rib fractures. No acute osseous abnormality IMPRESSION: 1. No definite CT evidence for acute intra-abdominal or pelvic abnormality. 2. Question mild asymmetric cecal wall thickening or intraluminal filling defect/mass. Suggest correlation with direct visualization/colonoscopy. 3. Increased dilatation of common bile duct measuring up to 2.1 cm, previously 1.2 cm. This may be correlated with LFTs with follow-up MRCP as indicated. 4.  Mphysema. 5. Aortic atherosclerosis. Aortic Atherosclerosis (ICD10-I70.0) and Emphysema (ICD10-J43.9). Electronically Signed   By: Donavan Foil [name]   On: 07/28/2022 19:32    Scheduled Meds:  cholestyramine light  4 g Oral Daily   enoxaparin (LOVENOX) injection  40 mg Subcutaneous Q24H   gabapentin  600 mg Oral TID   metoprolol succinate  12.5 mg Oral QHS   montelukast  10 mg Oral QHS   multivitamin with minerals  1 tablet Oral QHS   pantoprazole  40 mg Oral BID   umeclidinium bromide  1 puff Inhalation Daily   Continuous Infusions:  0.9 % NaCl with KCl 40 mEq / L 75 mL/hr at 07/29/22 0038   cefTRIAXone (ROCEPHIN)  IV     metronidazole 500 mg (07/29/22 0030)   potassium PHOSPHATE IVPB (in mmol)       LOS: 1 day   Darliss Cheney, MD [facility]  07/29/2022, 11:16 AM   *Please note that this is a verbal dictation therefore any spelling or grammatical errors are due to the "[place] [employer]" system interpretation.  Please page via [place] and do not message via secure chat for urgent patient care matters. Secure chat can be used for non urgent patient care matters.  How to contact the [facility] Attending or Consulting provider [place] or covering provider during after hours [place], for this patient?  Check the care team in [facility] and look for a) attending/consulting [facility] provider listed and b) the [facility] team listed. Page or secure chat [phone]. Log into [email or web address] and use [place]'s universal password to access. If you do not have the password, please contact the hospital operator. Locate the [facility] provider you are looking for under [facility] and page to a number that you can be directly reached. If you still have difficulty reaching the provider, please page the [facility] (Director on Call) for the Hospitalists listed on amion for assistance.

## 2022-07-30 ENCOUNTER — Other Ambulatory Visit (HOSPITAL_COMMUNITY): Payer: Self-pay

## 2022-07-30 DIAGNOSIS — C3491 Malignant neoplasm of unspecified part of right bronchus or lung: Secondary | ICD-10-CM | POA: Diagnosis not present

## 2022-07-30 DIAGNOSIS — Z720 Tobacco use: Secondary | ICD-10-CM

## 2022-07-30 DIAGNOSIS — N179 Acute kidney failure, unspecified: Secondary | ICD-10-CM

## 2022-07-30 DIAGNOSIS — R0602 Shortness of breath: Secondary | ICD-10-CM

## 2022-07-30 DIAGNOSIS — E871 Hypo-osmolality and hyponatremia: Secondary | ICD-10-CM

## 2022-07-30 DIAGNOSIS — E876 Hypokalemia: Secondary | ICD-10-CM

## 2022-07-30 DIAGNOSIS — K838 Other specified diseases of biliary tract: Secondary | ICD-10-CM

## 2022-07-30 DIAGNOSIS — R197 Diarrhea, unspecified: Secondary | ICD-10-CM | POA: Diagnosis not present

## 2022-07-30 LAB — GASTROINTESTINAL PANEL BY PCR, STOOL (REPLACES STOOL CULTURE)

## 2022-07-30 LAB — BASIC METABOLIC PANEL
Anion gap: 7 (ref 5–15)
Anion gap: 6 (ref 5–15)
BUN: <5 mg/dL — ABNORMAL LOW (ref 8–23)
BUN: <5 mg/dL — ABNORMAL LOW (ref 8–23)
CO2: 26 mmol/L (ref 22–32)
CO2: 27 mmol/L (ref 22–32)
Calcium: 8.1 mg/dL — ABNORMAL LOW (ref 8.9–10.3)
Calcium: 8.1 mg/dL — ABNORMAL LOW (ref 8.9–10.3)
Chloride: 104 mmol/L (ref 98–111)
Chloride: 102 mmol/L (ref 98–111)
Creatinine, Ser: 0.47 mg/dL (ref 0.44–1.00)
Creatinine, Ser: 0.56 mg/dL (ref 0.44–1.00)
GFR, Estimated: >60 mL/min (ref >60)
GFR, Estimated: >60 mL/min (ref >60)
Glucose, Bld: 97 mg/dL (ref 70–99)
Glucose, Bld: 102 mg/dL — ABNORMAL HIGH (ref 70–99)
Potassium: 5.6 mmol/L — ABNORMAL HIGH (ref 3.5–5.1)
Potassium: 4.9 mmol/L (ref 3.5–5.1)
Sodium: 137 mmol/L (ref 135–145)
Sodium: 135 mmol/L (ref 135–145)

## 2022-07-30 LAB — PHOSPHORUS: Phosphorus: 2.6 mg/dL (ref 2.5–4.6)

## 2022-07-30 LAB — MAGNESIUM: Magnesium: 1.8 mg/dL (ref 1.7–2.4)

## 2022-07-30 MED ORDER — IRBESARTAN 150 MG PO TABS
150.0000 mg | ORAL_TABLET | Freq: Every day | ORAL | Status: DC
  Administered 2022-07-30 – 2022-07-31 (×2): 150 mg via ORAL
  Filled 2022-07-30 (×2): qty 1

## 2022-07-30 MED ORDER — BUDESONIDE 3 MG PO CPEP
9.0000 mg | ORAL_CAPSULE | Freq: Every day | ORAL | Status: DC
  Administered 2022-07-30 – 2022-07-31 (×2): 9 mg via ORAL
  Filled 2022-07-30 (×2): qty 3

## 2022-07-30 MED ORDER — AMLODIPINE BESYLATE 5 MG PO TABS
2.5000 mg | ORAL_TABLET | Freq: Every day | ORAL | Status: DC
  Administered 2022-07-30: 2.5 mg via ORAL
  Filled 2022-07-30: qty 1

## 2022-07-30 MED ORDER — LOPERAMIDE HCL 2 MG PO CAPS
2.0000 mg | ORAL_CAPSULE | Freq: Four times a day (QID) | ORAL | Status: DC | PRN
  Administered 2022-07-30: 2 mg via ORAL
  Filled 2022-07-30: qty 1

## 2022-07-30 MED ORDER — METOPROLOL SUCCINATE ER 25 MG PO TB24
25.0000 mg | ORAL_TABLET | Freq: Every day | ORAL | Status: DC
  Administered 2022-07-30: 25 mg via ORAL
  Filled 2022-07-30: qty 1

## 2022-07-30 MED ORDER — UMECLIDINIUM-VILANTEROL 62.5-25 MCG/ACT IN AEPB
1.0000 | INHALATION_SPRAY | Freq: Every day | RESPIRATORY_TRACT | Status: DC
  Administered 2022-07-31: 1 via RESPIRATORY_TRACT
  Filled 2022-07-30: qty 14

## 2022-07-30 MED ORDER — IPRATROPIUM-ALBUTEROL 0.5-2.5 (3) MG/3ML IN SOLN
3.0000 mL | RESPIRATORY_TRACT | Status: DC | PRN
  Administered 2022-07-30 – 2022-07-31 (×5): 3 mL via RESPIRATORY_TRACT
  Filled 2022-07-30 (×5): qty 3

## 2022-07-30 NOTE — Progress Notes (Signed)
PROGRESS NOTE  [name] WNU:[id number] DOB: 03-19-1951   PCP: Fatima SangerPrevost, Mary Ellen, FNP  Patient is from: Home.  DOA: 07/28/2022 LOS: 2  Chief complaints Chief Complaint  Patient presents with   Dizziness     Brief Narrative / Interim history: 72 year old F with PMH of COPD, tobacco use disorder, lymphocytic colitis, right lung ca s/p lobectomy in 2021, cholecystectomy, HTN, HLD, migraine headache, endometriosis, anxiety, depression and TAA presenting with diarrhea, generalized weakness and lightheadedness, and admitted for diarrhea, AKI, hyponatremia and hypokalemia. Na 129.  K 2.6.  Cr 1.02 (baseline 0.6).  LFT without significant finding.  WBC 11.8.  CT with questionable mild asymmetric cecal wall thickening and CBD to 2.1 (previously 1.2).  GI consulted.  MRCP with substantial intra and extrahepatic biliary ductal dilation but minimally increased compared to prior study likely related to cholecystectomy, mild hepatic steatosis.  Initially started on antibiotic that was discontinued.  Diarrhea continued.  Patient was started on oral budesonide.  C. difficile and GI panel negative.  Subjective: Seen and examined earlier this morning.  Reports 3 episodes of watery bowel movements earlier this morning.  Also reports some shortness of breath and wheezing.  Denies nausea or vomiting.  Denies chest pain.  Objective: Vitals:   07/30/22 0523 07/30/22 0540 07/30/22 1212 07/30/22 1251  BP: (!) 136/96   (!) 130/98  Pulse: 79   65  Resp: 14   18  Temp: 97.7 F (36.5 C)     TempSrc:      SpO2: 90% 93% 92% 98%  Weight:      Height:        Examination:  GENERAL: No apparent distress.  Nontoxic. HEENT: MMM.  Vision and hearing grossly intact.  NECK: Supple.  No apparent JVD.  RESP:  No IWOB.  Fair aeration bilaterally. CVS:  RRR. Heart sounds normal.  ABD/GI/GU: BS+. Abd soft, NTND.  MSK/EXT:  Moves extremities. No apparent deformity. No edema.  SKIN: no apparent skin lesion or  wound NEURO: Awake, alert and oriented appropriately.  No apparent focal neuro deficit. PSYCH: Calm. Normal affect.   Procedures:  None  Microbiology summarized: C. difficile and GI panel negative.  Assessment and plan: Principal Problem:   Diarrhea  Acute diarrhea due to lymphocytic colitis: Abdominal exam benign.  C. difficile and GI panel negative.  CT and MRCP as above.  Reports 3 watery bowel movements earlier this morning. -Discontinue isolation precaution -Continue p.o. budesonide -Imodium as needed -Continue regular diet -Appreciate input by GI  Shortness of breath/wheezing/chronic COPD: Mild expiratory wheeze on exam.  Normal saturation on RA.  Euvolemic on exam. -Change Incruse Ellipta to Anoro Ellipta -Change albuterol to DuoNebs as needed -Encouraged smoking cessation.   Dilated CBD s/p cholecystectomy: Some improvement on MRCP.  LFT within normal.  Elevated AST: Mild.  Resolved.   Hypovolemic hyponatremia: Resolved.   AKI: Likely prerenal from diarrhea.  Resolved.  Hypokalemia/hypomagnesemia/hypophosphatemia -Hypokalemia resolved.  Check Mg  and P to ensure resolution -Hold HCTZ    Essential, hypertension: BP slightly elevated. -Continue home Toprol-XL and amlodipine -Avapro instead of home Micardis -Hold HCTZ  Polyneuropathy -Continue home meds   Chronic anxiety -Continue home regimen   GERD -Continue PPI   Hyperlipidemia -Resume statin on discharge  History of thoracic aortic aneurysm -Outpatient follow-up -Blood pressure control  History of breast cancer s/p lobectomy in 2021 -Outpatient follow-up   Body mass index is 22.4 kg/m.          DVT prophylaxis:  enoxaparin (LOVENOX) injection 40 mg Start: 07/28/22 2200  Code Status: Full code Family Communication: Updated patient's daughter from patient's phone in the room Level of care: Telemetry Status is: Inpatient Remains inpatient appropriate because: Ongoing  diarrhea   Final disposition: Home Consultants:  GI  35 minutes with more than 50% spent in reviewing records, counseling patient/family and coordinating care.   Sch Meds:  Scheduled Meds:  amLODipine  2.5 mg Oral QHS   budesonide  9 mg Oral Daily   cholestyramine light  4 g Oral Daily   enoxaparin (LOVENOX) injection  40 mg Subcutaneous Q24H   gabapentin  600 mg Oral TID   metoprolol succinate  25 mg Oral QHS   montelukast  10 mg Oral QHS   multivitamin with minerals  1 tablet Oral QHS   pantoprazole  40 mg Oral BID   umeclidinium-vilanterol  1 puff Inhalation Daily   Continuous Infusions: PRN Meds:.acyclovir ointment, ALPRAZolam, ipratropium-albuterol, prochlorperazine, zolpidem  Antimicrobials: Anti-infectives (From admission, onward)    Start     Dose/Rate Route Frequency Ordered Stop   07/29/22 2000  cefTRIAXone (ROCEPHIN) 2 g in sodium chloride 0.9 % 100 mL IVPB  Status:  Discontinued        2 g 200 mL/hr over 30 Minutes Intravenous Every 24 hours 07/28/22 2042 07/29/22 1155   07/28/22 2315  metroNIDAZOLE (FLAGYL) IVPB 500 mg  Status:  Discontinued        500 mg 100 mL/hr over 60 Minutes Intravenous Every 12 hours 07/28/22 2225 07/29/22 1155   07/28/22 2200  cefTRIAXone (ROCEPHIN) 1 g in sodium chloride 0.9 % 100 mL IVPB        1 g 200 mL/hr over 30 Minutes Intravenous  Once 07/28/22 2046 07/28/22 2148   07/28/22 2015  cefTRIAXone (ROCEPHIN) 1 g in sodium chloride 0.9 % 100 mL IVPB  Status:  Discontinued        1 g 200 mL/hr over 30 Minutes Intravenous  Once 07/28/22 2011 07/28/22 2114   07/28/22 2015  metroNIDAZOLE (FLAGYL) IVPB 500 mg  Status:  Discontinued        500 mg 100 mL/hr over 60 Minutes Intravenous Every 12 hours 07/28/22 2011 07/28/22 2225        I have personally reviewed the following labs and images: CBC: Recent Labs  Lab 07/28/22 1821 07/29/22 0609  WBC 11.8* 6.3  NEUTROABS  --  3.4  HGB 14.5 11.9*  HCT 41.6 35.0*  MCV 97.2 99.7  PLT  241 204   BMP &GFR Recent Labs  Lab 07/28/22 1821 07/29/22 0609 07/30/22 0546 07/30/22 0742  NA 129* 132* 137 135  K 2.6* 3.2* 5.6* 4.9  CL 87* 97* 104 102  CO2 29 25 26 27   GLUCOSE 111* 84 97 102*  BUN 11 7* <5* <5*  CREATININE 1.02* 0.51 0.47 0.56  CALCIUM 9.1 7.8* 8.1* 8.1*  MG  --  1.6*  --   --   PHOS  --  1.9*  --   --    Estimated Creatinine Clearance: 56.5 mL/min (by C-G formula based on SCr of 0.56 mg/dL). Liver & Pancreas: Recent Labs  Lab 07/28/22 1821 07/29/22 0609  AST 48* 26  ALT 36 24  ALKPHOS 61 43  BILITOT 0.9 0.6  PROT 6.8 4.9*  ALBUMIN 4.1 3.0*   Recent Labs  Lab 07/28/22 1821  LIPASE 25   No results for input(s): "AMMONIA" in the last 168 hours. Diabetic: No results for input(s): "HGBA1C" in  the last 72 hours. Recent Labs  Lab 07/28/22 1739  GLUCAP 131*   Cardiac Enzymes: No results for input(s): "CKTOTAL", "CKMB", "CKMBINDEX", "TROPONINI" in the last 168 hours. No results for input(s): "PROBNP" in the last 8760 hours. Coagulation Profile: No results for input(s): "INR", "PROTIME" in the last 168 hours. Thyroid Function Tests: No results for input(s): "TSH", "T4TOTAL", "FREET4", "T3FREE", "THYROIDAB" in the last 72 hours. Lipid Profile: No results for input(s): "CHOL", "HDL", "LDLCALC", "TRIG", "CHOLHDL", "LDLDIRECT" in the last 72 hours. Anemia Panel: No results for input(s): "VITAMINB12", "FOLATE", "FERRITIN", "TIBC", "IRON", "RETICCTPCT" in the last 72 hours. Urine analysis:    Component Value Date/Time   COLORURINE YELLOW 07/28/2022 1737   APPEARANCEUR CLEAR 07/28/2022 1737   LABSPEC 1.005 07/28/2022 1737   PHURINE 5.0 07/28/2022 1737   GLUCOSEU NEGATIVE 07/28/2022 1737   HGBUR NEGATIVE 07/28/2022 1737   BILIRUBINUR NEGATIVE 07/28/2022 1737   KETONESUR 5 (A) 07/28/2022 1737   PROTEINUR NEGATIVE 07/28/2022 1737   UROBILINOGEN 0.2 07/25/2014 1630   NITRITE NEGATIVE 07/28/2022 1737   LEUKOCYTESUR NEGATIVE 07/28/2022 1737    Sepsis Labs: Invalid input(s): "PROCALCITONIN", "LACTICIDVEN"  Microbiology: Recent Results (from the past 240 hour(s))  Gastrointestinal Panel by PCR , Stool     Status: None   Collection Time: 07/28/22  8:11 PM   Specimen: Stool  Result Value Ref Range Status   Campylobacter species NOT DETECTED NOT DETECTED Final   Plesimonas shigelloides NOT DETECTED NOT DETECTED Final   Salmonella species NOT DETECTED NOT DETECTED Final   Yersinia enterocolitica NOT DETECTED NOT DETECTED Final   Vibrio species NOT DETECTED NOT DETECTED Final   Vibrio cholerae NOT DETECTED NOT DETECTED Final   Enteroaggregative E coli (EAEC) NOT DETECTED NOT DETECTED Final   Enteropathogenic E coli (EPEC) NOT DETECTED NOT DETECTED Final   Enterotoxigenic E coli (ETEC) NOT DETECTED NOT DETECTED Final   Shiga like toxin producing E coli (STEC) NOT DETECTED NOT DETECTED Final   Shigella/Enteroinvasive E coli (EIEC) NOT DETECTED NOT DETECTED Final   Cryptosporidium NOT DETECTED NOT DETECTED Final   Cyclospora cayetanensis NOT DETECTED NOT DETECTED Final   Entamoeba histolytica NOT DETECTED NOT DETECTED Final   Giardia lamblia NOT DETECTED NOT DETECTED Final   Adenovirus F40/41 NOT DETECTED NOT DETECTED Final   Astrovirus NOT DETECTED NOT DETECTED Final   Norovirus GI/GII NOT DETECTED NOT DETECTED Final   Rotavirus A NOT DETECTED NOT DETECTED Final   Sapovirus (I, II, IV, and V) NOT DETECTED NOT DETECTED Final    Comment: Performed at [facility], [address] [place], [place] [address]  C Difficile Quick Screen w PCR reflex     Status: None   Collection Time: 07/28/22  8:36 PM   Specimen: Stool  Result Value Ref Range Status   C Diff antigen NEGATIVE NEGATIVE Final   C Diff toxin NEGATIVE NEGATIVE Final   C Diff interpretation No C. difficile detected.  Final    Comment: Performed at [facility], [address], [place], [place] [address]    Radiology Studies: No results  found.    Montgomery Favor T. Makayleigh Poliquin [facility]  If 7PM-7AM, please contact night-coverage [email or web address] 07/30/2022, 1:37 PM

## 2022-07-30 NOTE — TOC Progression Note (Signed)
Transition of Care [facility]) - Progression Note    Patient Details  Name: CHAMIKA KINSEL MRN: [id number] Date of Birth: Mar 11, 1951  Transition of Care [facility]) CM/SW Contact  Beckie Busing, RN Phone Number:[phone]  07/30/2022, 2:20 PM  Clinical Narrative:     Transition of Care [facility]) Screening Note   Patient Details  Name: BLAISE KNAUS Date of Birth: 20-May-1950   Transition of Care [facility]) CM/SW Contact:    Beckie Busing, RN Phone Number: 07/30/2022, 2:20 PM    Transition of Care Department [facility]) has reviewed patient and no TOC needs have been identified at this time. We will continue to monitor patient advancement through interdisciplinary progression rounds. If new patient transition needs arise, please place a TOC consult.          Expected Discharge Plan and Services                                               Social Determinants of Health (SDOH) Interventions SDOH Screenings   Food Insecurity: No Food Insecurity (07/28/2022)  Housing: Low Risk  (07/28/2022)  Transportation Needs: No Transportation Needs (07/28/2022)  Utilities: Not At Risk (07/28/2022)  Tobacco Use: High Risk (07/28/2022)    Readmission Risk Interventions     No data to display

## 2022-07-30 NOTE — Progress Notes (Signed)
Subjective: She had a fecal urgency episode this AM.  In total she had 4-5 diarrheal episodes.  Objective: Vital signs in last 24 hours: Temp:  [97.7 F (36.5 C)-97.8 F (36.6 C)] 97.7 F (36.5 C) (04/05 0523) Pulse Rate:  [66-79] 79 (04/05 0523) Resp:  [14-20] 14 (04/05 0523) BP: (104-136)/(86-96) 136/96 (04/05 0523) SpO2:  [90 %-98 %] 93 % (04/05 0540) Last BM Date : 07/29/22  Intake/Output from previous day: 04/04 0701 - 04/05 0700 In: 2326.7 [P.O.:480; I.V.:1846.7] Out: 100 [Urine:100] Intake/Output this shift: No intake/output data recorded.  General appearance: alert and no distress GI: soft, non-tender; bowel sounds normal; no masses,  no organomegaly  Lab Results: Recent Labs    07/28/22 1821 07/29/22 0609  WBC 11.8* 6.3  HGB 14.5 11.9*  HCT 41.6 35.0*  PLT 241 204   BMET Recent Labs    07/28/22 1821 07/29/22 0609 07/30/22 0546  NA 129* 132* 137  K 2.6* 3.2* 5.6*  CL 87* 97* 104  CO2 29 25 26   GLUCOSE 111* 84 97  BUN 11 7* <5*  CREATININE 1.02* 0.51 0.47  CALCIUM 9.1 7.8* 8.1*   LFT Recent Labs    07/29/22 0609  PROT 4.9*  ALBUMIN 3.0*  AST 26  ALT 24  ALKPHOS 43  BILITOT 0.6   PT/INR No results for input(s): "LABPROT", "INR" in the last 72 hours. Hepatitis Panel No results for input(s): "HEPBSAG", "HCVAB", "HEPAIGM", "HEPBIGM" in the last 72 hours. C-Diff Recent Labs    07/28/22 2036  CDIFFTOX NEGATIVE   Fecal Lactopherrin No results for input(s): "FECLLACTOFRN" in the last 72 hours.  Studies/Results: MR 3D Recon At Scanner  Result Date: 07/29/2022 CLINICAL DATA:  72 year old female with suspected biliary obstruction. EXAM: MRI ABDOMEN WITHOUT CONTRAST  (INCLUDING MRCP) TECHNIQUE: Multiplanar multisequence MR imaging of the abdomen was performed. Heavily T2-weighted images of the biliary and pancreatic ducts were obtained, and three-dimensional MRCP images were rendered by post processing. COMPARISON:  Abdominal MRI 11/26/2016.  FINDINGS: Comment: Today's study is limited for detection and characterization of visceral and/or vascular lesions by lack of IV gadolinium. Lower chest: Unremarkable. Hepatobiliary: Mild diffuse loss of signal intensity throughout the hepatic parenchyma on out of phase dual echo images indicative of a background of hepatic steatosis. In the periphery of the right lobe of the liver between segments 4B and 5 (axial image 16 of series 4 and coronal image 11 of series 5) there is a 9 x 5 x 17 mm T1 hypointense, T2 hyperintense lesion which is incompletely characterized on today's noncontrast examination, but was previously characterized as a cavernous hemangioma. No other definite new suspicious appearing hepatic lesions are noted on today's noncontrast examination. Moderate intrahepatic and severe extrahepatic biliary ductal dilatation is noted, with the proximal common bile duct measuring up to 19 mm in diameter. This has minimally increased compared to the prior study. No filling defect within the common bile duct noted on MRCP images to suggest the presence of choledocholithiasis. The duct is dilated all the way to the level of the ampulla. No definite ampullary mass confidently identified on today's noncontrast examination. Findings are favored to reflect post cholecystectomy physiology with reservoir effect. Pancreas: No definite pancreatic mass confidently identified on today's noncontrast examination. No pancreatic ductal dilatation noted on MRCP images. No peripancreatic fluid collections or inflammatory changes. Spleen:  Unremarkable. Adrenals/Urinary Tract: There are T1 hypointense and T2 hyperintense lesions in both kidneys, similar to the prior examination, incompletely characterized on today's study, but previously  characterized as simple cysts (Bosniak class 1) which requiring no imaging follow-up, the largest of which is in the interpolar region of the left kidney measuring 2 cm on today's examination.  No definite suspicious renal lesions otherwise noted. No hydroureteronephrosis in the visualized portions of the abdomen. Bilateral adrenal glands are normal in appearance. Stomach/Bowel: Visualized portions are unremarkable. Vascular/Lymphatic: No aneurysm identified in the visualized abdominal vasculature. No lymphadenopathy noted in the abdomen. Other: No significant volume of ascites noted in the visualized portions of the peritoneal cavity. Musculoskeletal: No aggressive appearing osseous lesions are noted in the visualized portions of the skeleton. IMPRESSION: 1. Although there is substantial intra and extrahepatic biliary ductal dilatation, this is only minimally increased compared to the prior study, and is presumably reflective of post cholecystectomy physiology with reservoir effect. No source of obstruction identified on today's examination. 2. Mild hepatic steatosis. 3. Additional incidental findings, similar to the prior study, as above. Electronically Signed   By: Trudie Reedaniel  Entrikin [name]   On: 07/29/2022 08:18   MR ABDOMEN MRCP WO CONTRAST  Result Date: 07/29/2022 CLINICAL DATA:  72 year old female with suspected biliary obstruction. EXAM: MRI ABDOMEN WITHOUT CONTRAST  (INCLUDING MRCP) TECHNIQUE: Multiplanar multisequence MR imaging of the abdomen was performed. Heavily T2-weighted images of the biliary and pancreatic ducts were obtained, and three-dimensional MRCP images were rendered by post processing. COMPARISON:  Abdominal MRI 11/26/2016. FINDINGS: Comment: Today's study is limited for detection and characterization of visceral and/or vascular lesions by lack of IV gadolinium. Lower chest: Unremarkable. Hepatobiliary: Mild diffuse loss of signal intensity throughout the hepatic parenchyma on out of phase dual echo images indicative of a background of hepatic steatosis. In the periphery of the right lobe of the liver between segments 4B and 5 (axial image 16 of series 4 and coronal image 11 of  series 5) there is a 9 x 5 x 17 mm T1 hypointense, T2 hyperintense lesion which is incompletely characterized on today's noncontrast examination, but was previously characterized as a cavernous hemangioma. No other definite new suspicious appearing hepatic lesions are noted on today's noncontrast examination. Moderate intrahepatic and severe extrahepatic biliary ductal dilatation is noted, with the proximal common bile duct measuring up to 19 mm in diameter. This has minimally increased compared to the prior study. No filling defect within the common bile duct noted on MRCP images to suggest the presence of choledocholithiasis. The duct is dilated all the way to the level of the ampulla. No definite ampullary mass confidently identified on today's noncontrast examination. Findings are favored to reflect post cholecystectomy physiology with reservoir effect. Pancreas: No definite pancreatic mass confidently identified on today's noncontrast examination. No pancreatic ductal dilatation noted on MRCP images. No peripancreatic fluid collections or inflammatory changes. Spleen:  Unremarkable. Adrenals/Urinary Tract: There are T1 hypointense and T2 hyperintense lesions in both kidneys, similar to the prior examination, incompletely characterized on today's study, but previously characterized as simple cysts (Bosniak class 1) which requiring no imaging follow-up, the largest of which is in the interpolar region of the left kidney measuring 2 cm on today's examination. No definite suspicious renal lesions otherwise noted. No hydroureteronephrosis in the visualized portions of the abdomen. Bilateral adrenal glands are normal in appearance. Stomach/Bowel: Visualized portions are unremarkable. Vascular/Lymphatic: No aneurysm identified in the visualized abdominal vasculature. No lymphadenopathy noted in the abdomen. Other: No significant volume of ascites noted in the visualized portions of the peritoneal cavity.  Musculoskeletal: No aggressive appearing osseous lesions are noted in the visualized portions of  the skeleton. IMPRESSION: 1. Although there is substantial intra and extrahepatic biliary ductal dilatation, this is only minimally increased compared to the prior study, and is presumably reflective of post cholecystectomy physiology with reservoir effect. No source of obstruction identified on today's examination. 2. Mild hepatic steatosis. 3. Additional incidental findings, similar to the prior study, as above. Electronically Signed   By: Trudie Reed [name]   On: 07/29/2022 05:31   CT Head Wo Contrast  Result Date: 07/28/2022 CLINICAL DATA:  Mental status change dizzy EXAM: CT HEAD WITHOUT CONTRAST TECHNIQUE: Contiguous axial images were obtained from the base of the skull through the vertex without intravenous contrast. RADIATION DOSE REDUCTION: This exam was performed according to the departmental dose-optimization program which includes automated exposure control, adjustment of the mA and/or kV according to patient size and/or use of iterative reconstruction technique. COMPARISON:  CT brain 06/28/2010 FINDINGS: Brain: No acute territorial infarction, hemorrhage or intracranial mass. Mild atrophy. Mild white matter hypodensity likely chronic small vessel ischemic change. Nonenlarged ventricles. Vascular: No hyperdense vessels.  Carotid vascular calcification Skull: Normal. Negative for fracture or focal lesion. Sinuses/Orbits: Postsurgical changes of the maxillary sinuses. Mild mucosal thickening Other: None IMPRESSION: 1. No CT evidence for acute intracranial abnormality. 2. Atrophy and mild chronic small vessel ischemic changes of the white matter. Electronically Signed   By: Jasmine Pang [name]   On: 07/28/2022 19:35   CT ABDOMEN PELVIS WO CONTRAST  Result Date: 07/28/2022 CLINICAL DATA:  Diarrhea EXAM: CT ABDOMEN AND PELVIS WITHOUT CONTRAST TECHNIQUE: Multidetector CT imaging of the abdomen and pelvis  was performed following the standard protocol without IV contrast. RADIATION DOSE REDUCTION: This exam was performed according to the departmental dose-optimization program which includes automated exposure control, adjustment of the mA and/or kV according to patient size and/or use of iterative reconstruction technique. COMPARISON:  CT 10/11/2016 FINDINGS: Lower chest: Lung bases demonstrate emphysema. No acute airspace disease. Coronary vascular calcification ascending aortic diameter up to 4.2 cm. Hepatobiliary: Status post cholecystectomy. Increased dilatation of common bile duct measuring up to 2.1 cm, previously 1.2 cm. Pancreas: Unremarkable. No pancreatic ductal dilatation or surrounding inflammatory changes. Spleen: Normal in size without focal abnormality. Adrenals/Urinary Tract: Adrenal glands are normal. Kidneys show no hydronephrosis. Probable cyst midpole left kidney, no imaging follow-up is recommended. The bladder is grossly unremarkable Stomach/Bowel: Stomach nonenlarged. No dilated small bowel. Question mild asymmetric cecal wall thickening or filling defect, series 7, image 74 and 71. Vascular/Lymphatic: Advanced aortic atherosclerosis. No aneurysm. No suspicious lymph nodes. Reproductive: Hysterectomy.  No adnexal mass Other: Negative for pelvic effusion or free air. Musculoskeletal: Old right posterior rib fractures. No acute osseous abnormality IMPRESSION: 1. No definite CT evidence for acute intra-abdominal or pelvic abnormality. 2. Question mild asymmetric cecal wall thickening or intraluminal filling defect/mass. Suggest correlation with direct visualization/colonoscopy. 3. Increased dilatation of common bile duct measuring up to 2.1 cm, previously 1.2 cm. This may be correlated with LFTs with follow-up MRCP as indicated. 4. Mphysema. 5. Aortic atherosclerosis. Aortic Atherosclerosis (ICD10-I70.0) and Emphysema (ICD10-J43.9). Electronically Signed   By: Jasmine Pang [name]   On: 07/28/2022  19:32    Medications: Scheduled:  amLODipine  2.5 mg Oral QHS   cholestyramine light  4 g Oral Daily   enoxaparin (LOVENOX) injection  40 mg Subcutaneous Q24H   gabapentin  600 mg Oral TID   metoprolol succinate  25 mg Oral QHS   montelukast  10 mg Oral QHS   multivitamin with minerals  1 tablet Oral  QHS   pantoprazole  40 mg Oral BID   umeclidinium bromide  1 puff Inhalation Daily   Continuous:  Assessment/Plan: 1) History of lymphocytic colitis. 2) Diarrhea. 3) COPD.   With the longevity of her diarrhea, and the negative C diff, she will be started on budesonide.  The GI pathogen panel is still pending, but her diarrhea is believed to be from her lymphocytic colitis.  Plan: 1) Budesonide 9 mg QD. 2) [facility] will round this weekend.  LOS: 2 days   Jeanny Rymer D 07/30/2022, 7:36 AM

## 2022-07-30 NOTE — TOC Benefit Eligibility Note (Signed)
Patient Product/process development scientist completed.    The patient is currently admitted and upon discharge could be taking budesonide (Entocort) 3 mg 24 hr capsule.  The current 30 day co-pay is 10.00.   The patient is insured through [employer] Part D   This test claim was processed through [facility]- copay amounts may vary at other pharmacies due to pharmacy/plan contracts, or as the patient moves through the different stages of their insurance plan.  Roland Earl, CPHT Pharmacy Patient Advocate Specialist [facility] Patient Advocate Team Direct Number: [phone]  Fax: [phone]

## 2022-07-31 ENCOUNTER — Other Ambulatory Visit (HOSPITAL_COMMUNITY): Payer: Self-pay

## 2022-07-31 DIAGNOSIS — K52832 Lymphocytic colitis: Secondary | ICD-10-CM

## 2022-07-31 DIAGNOSIS — N179 Acute kidney failure, unspecified: Secondary | ICD-10-CM | POA: Diagnosis not present

## 2022-07-31 DIAGNOSIS — Z720 Tobacco use: Secondary | ICD-10-CM | POA: Diagnosis not present

## 2022-07-31 DIAGNOSIS — C3491 Malignant neoplasm of unspecified part of right bronchus or lung: Secondary | ICD-10-CM | POA: Diagnosis not present

## 2022-07-31 DIAGNOSIS — R197 Diarrhea, unspecified: Secondary | ICD-10-CM | POA: Diagnosis not present

## 2022-07-31 LAB — CBC
HCT: 37.2 % (ref 36.0–46.0)
Hemoglobin: 12.4 g/dL (ref 12.0–15.0)
MCH: 34.3 pg — ABNORMAL HIGH (ref 26.0–34.0)
MCHC: 33.3 g/dL (ref 30.0–36.0)
MCV: 102.8 fL — ABNORMAL HIGH (ref 80.0–100.0)
Platelets: 208 10*3/uL (ref 150–400)
RBC: 3.62 MIL/uL — ABNORMAL LOW (ref 3.87–5.11)
RDW: 13.4 % (ref 11.5–15.5)
WBC: 5.5 10*3/uL (ref 4.0–10.5)
nRBC: 0 % (ref 0.0–0.2)

## 2022-07-31 LAB — RENAL FUNCTION PANEL
Albumin: 3.2 g/dL — ABNORMAL LOW (ref 3.5–5.0)
Anion gap: 6 (ref 5–15)
BUN: 9 mg/dL (ref 8–23)
CO2: 26 mmol/L (ref 22–32)
Calcium: 8.9 mg/dL (ref 8.9–10.3)
Chloride: 101 mmol/L (ref 98–111)
Creatinine, Ser: 0.64 mg/dL (ref 0.44–1.00)
GFR, Estimated: >60 mL/min (ref >60)
Glucose, Bld: 106 mg/dL — ABNORMAL HIGH (ref 70–99)
Phosphorus: 3.5 mg/dL (ref 2.5–4.6)
Potassium: 4.5 mmol/L (ref 3.5–5.1)
Sodium: 133 mmol/L — ABNORMAL LOW (ref 135–145)

## 2022-07-31 LAB — MAGNESIUM: Magnesium: 1.6 mg/dL — ABNORMAL LOW (ref 1.7–2.4)

## 2022-07-31 LAB — CK: Total CK: 42 U/L (ref 38–234)

## 2022-07-31 MED ORDER — BUDESONIDE 3 MG PO CPEP
9.0000 mg | ORAL_CAPSULE | Freq: Every day | ORAL | 2 refills | Status: DC
  Filled 2022-07-31: qty 90, 30d supply, fill #0
  Filled 2022-08-24: qty 90, 30d supply, fill #1
  Filled 2022-09-26: qty 90, 30d supply, fill #2

## 2022-07-31 MED ORDER — BUDESONIDE 3 MG PO CPEP
9.0000 mg | ORAL_CAPSULE | Freq: Every day | ORAL | 2 refills | Status: DC
  Filled 2022-07-31: qty 30, 10d supply, fill #0

## 2022-07-31 MED ORDER — ESZOPICLONE 3 MG PO TABS: 3.0000 mg | ORAL_TABLET | Freq: Every evening | ORAL | 0 refills | Status: AC | PRN

## 2022-07-31 MED ORDER — MAGNESIUM SULFATE 2 GM/50ML IV SOLN
2.0000 g | Freq: Once | INTRAVENOUS | Status: AC
  Administered 2022-07-31: 2 g via INTRAVENOUS
  Filled 2022-07-31: qty 50

## 2022-07-31 MED ORDER — UMECLIDINIUM-VILANTEROL 62.5-25 MCG/ACT IN AEPB
1.0000 | INHALATION_SPRAY | Freq: Every day | RESPIRATORY_TRACT | 1 refills | Status: DC
  Filled 2022-07-31: qty 60, 60d supply, fill #0

## 2022-07-31 NOTE — Progress Notes (Signed)
A long discharge process due to calling outpatient pharmacy a few times trying to find out if patient's medications are covered by her insurance. Also had to contact MD to get prescriptions changed.

## 2022-07-31 NOTE — Evaluation (Signed)
Occupational Therapy Evaluation Patient Details Name: [name] MRN: [id number] DOB: 09-Jun-1950 Today's Date: 07/31/2022   History of Present Illness Pt is a 71yo female presenting to [facility] ED on 4/4 with dirrhea, weakness, and lightheadedness. CT abdomen revealed question mild asymmetric cecal wall thickening or intraluminal filling defect/mass.  Suggest correlation with direct visualization/colonoscopy.  PMH: lymphocytic colitis, R lung cancer s/p lobectomy 2021, tobacco abuse, COPD, HTN, HLD, anxiety & depression,   Clinical Impression   OT eval and education complete. No further OT needed      Recommendations for follow up therapy are one component of a multi-disciplinary discharge planning process, led by the attending physician.  Recommendations may be updated based on patient status, additional functional criteria and insurance authorization.   Assistance Recommended at Discharge None              Precautions / Restrictions Precautions Precautions: Fall Precaution Comments: recent falls Restrictions Weight Bearing Restrictions: No      Mobility Bed Mobility Overal bed mobility: Independent                  Transfers Overall transfer level: Independent                        Balance Overall balance assessment: History of Falls, Mild deficits observed, not formally tested                               Standardized Balance Assessment Standardized Balance Assessment : Dynamic Gait Index   Dynamic Gait Index Level Surface: Normal Change in Gait Speed: Mild Impairment Gait with Horizontal Head Turns: Normal Gait with Vertical Head Turns: Normal Gait and Pivot Turn: Mild Impairment Step Over Obstacle: Normal Step Around Obstacles: Normal Steps: Mild Impairment Total Score: 21     ADL either performed or assessed with clinical judgement   ADL Overall ADL's : At baseline                                              Vision Patient Visual Report: No change from baseline        Extremity/Trunk Assessment Upper Extremity Assessment Upper Extremity Assessment: Overall WFL for tasks assessed   Lower Extremity Assessment Lower Extremity Assessment: Overall WFL for tasks assessed (Pt reports history of periperhal neuropathy)   Cervical / Trunk Assessment Cervical / Trunk Assessment: Normal   Communication Communication Communication: No difficulties   Cognition Arousal/Alertness: Awake/alert Behavior During Therapy: WFL for tasks assessed/performed Overall Cognitive Status: Within Functional Limits for tasks assessed                                       General Comments  91% HR78    Exercises          Home Living Family/patient expects to be discharged to:: Private residence Living Arrangements: Alone Available Help at Discharge: Family;Available PRN/intermittently Type of Home: House Home Access: Level entry     Home Layout: One level     Bathroom Shower/Tub: Walk-in shower         Home Equipment: Grab bars - tub/shower;Shower seat;Rolling Environmental consultant (2 wheels)   Additional Comments: Son & DIL live in town  Prior Functioning/Environment Prior Level of Function : Independent/Modified Independent;Driving;History of Falls (last six months)             Mobility Comments: ind; Fall tripped outside over a box when a bee flew in her face ADLs Comments: ind         AM-PAC OT "6 Clicks" Daily Activity     Outcome Measure Help from another person eating meals?: None Help from another person taking care of personal grooming?: None Help from another person toileting, which includes using toliet, bedpan, or urinal?: None Help from another person bathing (including washing, rinsing, drying)?: None Help from another person to put on and taking off regular upper body clothing?: None Help from another person to put on and taking off regular lower body  clothing?: None 6 Click Score: 24   End of Session Nurse Communication: Mobility status  Activity Tolerance: Patient tolerated treatment well Patient left: in chair;with call bell/phone within reach                   Time: 1030-1045 OT Time Calculation (min): 15 min Charges:  OT General Charges $OT Visit: 1 Visit OT Evaluation $OT Eval Low Complexity: 1 Low  Lise Auer, OT Acute Rehabilitation Services  [phone]     Einar Crow D 07/31/2022, 1:17 PM

## 2022-07-31 NOTE — Evaluation (Signed)
Physical Therapy Evaluation Patient Details Name: [name] MRN: [id number] DOB: [birth year] Today's Date: 07/31/2022  History of Present Illness  Pt is a 71yo female presenting to [facility] ED on 4/4 with dirrhea, weakness, and lightheadedness. CT abdomen revealed question mild asymmetric cecal wall thickening or intraluminal filling defect/mass.  Suggest correlation with direct visualization/colonoscopy.  PMH: lymphocytic colitis, R lung cancer s/p lobectomy 2021, tobacco abuse, COPD, HTN, HLD, anxiety & depression,   Clinical Impression  Patient evaluated by Physical Therapy with no further acute PT needs identified. All education has been completed and the patient has no further questions. Pt has history of falling, independent with all bed mobility, transfers, and ambulation today, pt ambulated 133ft without overt LOB; pt completed Dynamic gait index assessment and scored 21/24 indicating pt is not at an increased fall risk at this time. SpO2 monitored at regular intervals via personal pulse ox and found to be consistently 91%, HR71. See below for any follow-up Physical Therapy or equipment needs. PT is signing off. Thank you for this referral.       Recommendations for follow up therapy are one component of a multi-disciplinary discharge planning process, led by the attending physician.  Recommendations may be updated based on patient status, additional functional criteria and insurance authorization.  Follow Up Recommendations   No PT follow up    Assistance Recommended at Discharge PRN  Patient can return home with the following  Assistance with cooking/housework    Equipment Recommendations None recommended by PT  Recommendations for Other Services       Functional Status Assessment Patient has not had a recent decline in their functional status     Precautions / Restrictions Precautions Precautions: Fall Precaution Comments: recent falls Restrictions Weight Bearing  Restrictions: No      Mobility  Bed Mobility Overal bed mobility: Independent                  Transfers Overall transfer level: Independent                      Ambulation/Gait Ambulation/Gait assistance: Independent Gait Distance (Feet): 150 Feet Assistive device: None Gait Pattern/deviations: WFL(Within Functional Limits) Gait velocity: WFL     General Gait Details: Pt ambulated independelty without overt LOB noted or physical assist required, no gait deviations noted  Stairs            Wheelchair Mobility    Modified Rankin (Stroke Patients Only)       Balance Overall balance assessment: History of Falls, Mild deficits observed, not formally tested                               Standardized Balance Assessment Standardized Balance Assessment : Dynamic Gait Index   Dynamic Gait Index Level Surface: Normal Change in Gait Speed: Mild Impairment Gait with Horizontal Head Turns: Normal Gait with Vertical Head Turns: Normal Gait and Pivot Turn: Mild Impairment Step Over Obstacle: Normal Step Around Obstacles: Normal Steps: Mild Impairment Total Score: 21       Pertinent Vitals/Pain Pain Assessment Pain Assessment: No/denies pain    Home Living Family/patient expects to be discharged to:: Private residence Living Arrangements: Alone Available Help at Discharge: Family;Available PRN/intermittently Type of Home: House Home Access: Level entry       Home Layout: One level Home Equipment: Grab bars - tub/shower;Shower seat;Rolling Environmental consultant (2 wheels) Additional Comments: Son & DIL live  in town    Prior Function Prior Level of Function : Independent/Modified Independent;Driving;History of Falls (last six months)             Mobility Comments: ind; Fall tripped outside over a box when a bee flew in her face ADLs Comments: ind     Hand Dominance        Extremity/Trunk Assessment   Upper Extremity  Assessment Upper Extremity Assessment: Defer to OT evaluation    Lower Extremity Assessment Lower Extremity Assessment: Overall WFL for tasks assessed (Pt reports history of periperhal neuropathy)    Cervical / Trunk Assessment Cervical / Trunk Assessment: Normal  Communication   Communication: No difficulties  Cognition Arousal/Alertness: Awake/alert Behavior During Therapy: WFL for tasks assessed/performed Overall Cognitive Status: Within Functional Limits for tasks assessed                                          General Comments General comments (skin integrity, edema, etc.): 91% HR78    Exercises     Assessment/Plan    PT Assessment Patient does not need any further PT services  PT Problem List         PT Treatment Interventions      PT Goals (Current goals can be found in the Care Plan section)       Frequency       Co-evaluation               AM-PAC PT "6 Clicks" Mobility  Outcome Measure Help needed turning from your back to your side while in a flat bed without using bedrails?: None Help needed moving from lying on your back to sitting on the side of a flat bed without using bedrails?: None Help needed moving to and from a bed to a chair (including a wheelchair)?: None Help needed standing up from a chair using your arms (e.g., wheelchair or bedside chair)?: None Help needed to walk in hospital room?: None Help needed climbing 3-5 steps with a railing? : A Little 6 Click Score: 23    End of Session Equipment Utilized During Treatment: Gait belt Activity Tolerance: Patient tolerated treatment well;No increased pain   Nurse Communication: Mobility status PT Visit Diagnosis: History of falling (Z91.81)    Time: 1030-1053 PT Time Calculation (min) (ACUTE ONLY): 23 min   Charges:   PT Evaluation $PT Eval Low Complexity: 1 Low PT Treatments $Gait Training: 8-22 mins        Jamesetta Geralds, PT, DPT [facility]  Department Office: [phone] Weekend pager: [phone]  Jamesetta Geralds 07/31/2022, 10:58 AM

## 2022-07-31 NOTE — Progress Notes (Signed)
  GI Progress Note Covering for Drs. Mann & Hung   Assessment    Lymphocytic colitis, diarrhea slightly improved. GI pathogen panel negative.   Recommendations   Continue budesonide 9 mg qd now and as an outpatient Continue cholestyramine qd prn If diet is tolerated and diarrhea reasonably controlled OK for discharge and outpatient GI follow up with Dr. Elnoria Howard   Chief Complaint   Diarrhea persists however it has slightly improved. SOB this morning  Vital signs in last 24 hours: Temp:  [97.4 F (36.3 C)-97.7 F (36.5 C)] 97.4 F (36.3 C) (04/06 0557) Pulse Rate:  [65-76] 76 (04/06 0557) Resp:  [14-18] 14 (04/06 0557) BP: (127-166)/(79-98) 151/86 (04/06 0651) SpO2:  [92 %-100 %] 97 % (04/06 0738) Last BM Date : 07/29/22  General: Alert, well-developed, in NAD Heart:  Regular rate and rhythm; no murmurs Chest: Clear to ascultation bilaterally, decreased BS bilaterally Abdomen:  Soft, nontender and nondistended. Normal bowel sounds, without guarding, and without rebound.   Extremities:  Without edema. Neurologic:  Alert and  oriented x4; grossly normal neurologically. Psych:  Alert and cooperative. Normal mood and affect.  Intake/Output from previous day: 04/05 0701 - 04/06 0700 In: 240 [P.O.:240] Out: -  Intake/Output this shift: No intake/output data recorded.  Lab Results: Recent Labs    07/28/22 1821 07/29/22 0609 07/31/22 0701  WBC 11.8* 6.3 5.5  HGB 14.5 11.9* 12.4  HCT 41.6 35.0* 37.2  PLT 241 204 208   BMET Recent Labs    07/30/22 0546 07/30/22 0742 07/31/22 0701  NA 137 135 133*  K 5.6* 4.9 4.5  CL 104 102 101  CO2 26 27 26   GLUCOSE 97 102* 106*  BUN <5* <5* 9  CREATININE 0.47 0.56 0.64  CALCIUM 8.1* 8.1* 8.9   LFT Recent Labs    07/29/22 0609 07/31/22 0701  PROT 4.9*  --   ALBUMIN 3.0* 3.2*  AST 26  --   ALT 24  --   ALKPHOS 43  --   BILITOT 0.6  --      LOS: 3 days   Evelin Cake T. Russella Dar, MD 07/31/2022, 7:42 AM See Loretha Stapler, [place]  [facility], to contact our on call provider

## 2022-07-31 NOTE — Discharge Summary (Signed)
Physician Discharge Summary  [name] ZOX:[id number] DOB: 07-20-1950 DOA: 07/28/2022  PCP: Fatima Sanger, FNP  Admit date: 07/28/2022 Discharge date: 07/31/2022 Admitted From: Home Disposition: Home Recommendations for Outpatient Follow-up:  Follow up with PCP in 1 week GI to arrange outpatient follow-up Check CMP and CBC at follow-up Please follow up on the following pending results: None  Home Health: Not required Equipment/Devices: Not required  Discharge Condition: Stable CODE STATUS: Full code  Follow-up Information     Fatima Sanger, FNP. Schedule an appointment as soon as possible for a visit in 1 week(s).   Specialty: Internal Medicine Contact information: [address] [place] [address] [phone]                 Hospital course 72 year old F with PMH of COPD, tobacco use disorder, lymphocytic colitis, right lung ca s/p lobectomy in 2021, cholecystectomy, HTN, HLD, migraine headache, endometriosis, anxiety, depression and TAA presenting with diarrhea, generalized weakness and lightheadedness, and admitted for diarrhea, AKI, hyponatremia and hypokalemia. Na 129.  K 2.6.  Cr 1.02 (baseline 0.6).  LFT without significant finding.  WBC 11.8.  CT with questionable mild asymmetric cecal wall thickening and CBD to 2.1 (previously 1.2).  GI consulted.  MRCP with substantial intra and extrahepatic biliary ductal dilation but minimally increased compared to prior study likely related to cholecystectomy, mild hepatic steatosis.  Initially started on antibiotic that was discontinued.   C. difficile and GI panel negative.  Diarrhea felt to be due to lymphocytic colitis.  Patient was started on oral budesonide by GI.  Diarrhea improved.  AKI, hypokalemia leukocytosis resolved.  Hyponatremia improved.  Cleared for discharge by GI on budesonide.  Advised to avoid greasy/fatty foods.   See individual problem list below for more.   Problems  addressed during this hospitalization Principal Problem:   Diarrhea Active Problems:   Tobacco abuse   SOB (shortness of breath)   Adenocarcinoma of right lung, stage 1   AKI (acute kidney injury)   Acute diarrhea due to lymphocytic colitis: Abdominal exam benign.  C. difficile and GI panel negative.  CT and MRCP as above.  Diarrhea subsided.  Tolerated regular diet. -Discharged on p.o. budesonide -Imodium as needed -Advised to avoid or minimize greasy foods   SOB/wheezing/chronic COPD: Improved.  No oxygen requirement. -Discharged on home Spiriva and albuterol.  Unfortunately, Anoro Ellipta was not covered by insurance.  Has allergies to steroid inhaler -Encouraged smoking cessation. -Outpatient follow-up with pulmonology   Dilated CBD s/p cholecystectomy: Some improvement on MRCP.  LFT within normal.   Elevated AST: Mild.  Resolved.   Hypovolemic hyponatremia: Improved. -Recheck in 1 week   AKI: Likely prerenal from diarrhea.  Resolved.   Hypokalemia/hypomagnesemia/hypophosphatemia -Hypokalemia and hypophosphatemia resolved.  Hypomagnesemia replenished prior to discharge   Essential, hypertension: Normotensive. -Continue home meds.   Polyneuropathy -Continue home meds   Chronic anxiety/insomnia -Continue home regimen   GERD -Continue PPI   Hyperlipidemia -Continue home meds.   History of thoracic aortic aneurysm -Outpatient follow-up -Blood pressure control   History of breast cancer s/p lobectomy in 2021 -Outpatient follow-up    Time spent 35 minutes  Vital signs Vitals:   07/31/22 0738 07/31/22 0742 07/31/22 1153 07/31/22 1315  BP:      Pulse:      Temp:      Resp:      Height:      Weight:      SpO2: 97% 97% 98% 91%  TempSrc:      BMI (Calculated):         Discharge exam  GENERAL: No apparent distress.  Nontoxic. HEENT: MMM.  Vision and hearing grossly intact.  NECK: Supple.  No apparent JVD.  RESP:  No IWOB.  Fair aeration  bilaterally. CVS:  RRR. Heart sounds normal.  ABD/GI/GU: BS+. Abd soft, NTND.  MSK/EXT:  Moves extremities. No apparent deformity. No edema.  SKIN: no apparent skin lesion or wound NEURO: Awake and alert. Oriented appropriately.  No apparent focal neuro deficit. PSYCH: Calm. Normal affect.   Discharge Instructions Discharge Instructions     Diet - low sodium heart healthy   Complete by: As directed    Discharge instructions   Complete by: As directed    It has been a pleasure taking care of you!  You were hospitalized due to diarrhea likely due to lymphocytic colitis.  You have been started on budesonide.  Your diarrhea seems to have improved.  Continue taking budesonide.  You may use Imodium as needed for diarrhea.  Maintain good hydration.  Strongly recommend avoiding greasy/fatty food which could cause diarrhea in the absence of gallbladder.  Please review your new medication list and the directions on your medications before you take them.  Follow-up with your primary care doctor in 1 to 2 weeks or sooner if needed.  Follow-up with your gastroenterologist per their recommendation.   Take care,   Increase activity slowly   Complete by: As directed       Allergies as of 07/31/2022       Reactions   Iodinated Contrast Media Itching, Other (See Comments)   itching but no hives just after iv contrast injection w/ 13 hr prep, future contrast not advised unless absolutely necessary, 13 hr prep would still be advised.//a.[email or web address]   Ioxaglate Itching, Other (See Comments)   itching but no hives just after iv contrast injection w/ 13 hr prep, future contrast not advised unless absolutely necessary, 13 hr prep would still be advised.//a.calhoun   Omnipaque [iohexol] Anaphylaxis   Buprenorphine Hcl Nausea And Vomiting   Codeine Nausea And Vomiting   Erythromycin Other (See Comments)   Hospitalized for 5 days after receiving too much   Morphine And Related Nausea And Vomiting   Nicoderm  [nicotine] Itching, Other (See Comments)   Patches cause itching   Oyster Extract Nausea And Vomiting, Other (See Comments)   "I went into shock"        Medication List     STOP taking these medications    OVER THE COUNTER MEDICATION   OVER THE COUNTER MEDICATION       TAKE these medications    ALPRAZolam 0.5 MG tablet Commonly known as: XANAX Take 0.5 mg by mouth 3 (three) times daily as needed for anxiety or sleep.   amLODipine 2.5 MG tablet Commonly known as: NORVASC TAKE 1 TABLET BY MOUTH DAILY. What changed: when to take this   aspirin EC 81 MG tablet Take 81 mg by mouth every other day. Notes to patient: Not given during hospital admission   bismuth subsalicylate 262 MG chewable tablet Commonly known as: PEPTO BISMOL Chew 524 mg by mouth as needed for diarrhea or loose stools. Notes to patient: Not given during hospital admission   budesonide 3 MG 24 hr capsule Commonly known as: ENTOCORT EC Take 3 capsules (9 mg total) by mouth daily.   CALCIUM PO Take 1 tablet by mouth 2 (two) times a week. Notes to patient:  Not given during hospital admission   cholestyramine 4 GM/DOSE powder Commonly known as: QUESTRAN Take 4 g by mouth daily as needed (for diarrhea). Notes to patient: The GI doctor said that you don't have to take this every day   denosumab 60 MG/ML Soln injection Commonly known as: PROLIA Inject 60 mg into the skin every 6 (six) months. Administer in upper arm, thigh, or abdomen Notes to patient: Not given during hospital admission   ergocalciferol 1.25 MG (50000 UT) capsule Commonly known as: VITAMIN D2 Take 50,000 Units by mouth every Saturday.   eszopiclone 1 MG Tabs tablet Commonly known as: LUNESTA Take 1-2 mg by mouth at bedtime as needed for sleep (Take immediately before bedtime). Notes to patient: Not given during hospital admission (you were given Ambien instead)   Eszopiclone 3 MG Tabs Commonly known as: eszopiclone Take 1  tablet (3 mg total) by mouth at bedtime as needed (for sleep- Take immediately before bedtime). Notes to patient: Duplicate drug as above.  Not given during hospital admission   gabapentin 300 MG capsule Commonly known as: NEURONTIN TAKE 2 CAPSULES(600 MG) BY MOUTH THREE TIMES DAILY What changed: See the new instructions.   loperamide 2 MG tablet Commonly known as: IMODIUM A-D Take 2 mg by mouth 4 (four) times daily as needed for diarrhea or loose stools.   metoprolol succinate 25 MG 24 hr tablet Commonly known as: TOPROL-XL TAKE 1 TABLET(25 MG) BY MOUTH DAILY What changed: See the new instructions. Notes to patient: You were taking this medication in the evening at the hospital, but may return to your home regimen   montelukast 10 MG tablet Commonly known as: SINGULAIR Take 10 mg by mouth at bedtime.   pantoprazole 40 MG tablet Commonly known as: PROTONIX Take 1 tablet (40 mg total) by mouth 2 (two) times daily. What changed:  when to take this additional instructions   PreserVision AREDS 2 Caps Take 1 capsule by mouth in the morning and at bedtime. What changed: Another medication with the same name was removed. Continue taking this medication, and follow the directions you see here.   rosuvastatin 5 MG tablet Commonly known as: CRESTOR TAKE 1 TABLET(5 MG) BY MOUTH DAILY What changed: See the new instructions. Notes to patient: Not given during hospital admission   Spiriva Respimat 2.5 MCG/ACT Aers Generic drug: Tiotropium Bromide Monohydrate INHALE 2 PUFFS INTO THE LUNGS DAILY What changed: when to take this   telmisartan-hydrochlorothiazide 80-12.5 MG tablet Commonly known as: MICARDIS HCT TAKE 1 TABLET BY MOUTH DAILY   UNKNOWN TO PATIENT Take 1-2 tablets by mouth See admin instructions. [place] brand otc sleep aid: Take 1-2 tablets by mouth as needed for sleep   UNKNOWN TO PATIENT Place 1 drop into both eyes See admin instructions. Unnamed OTC eye drops for  dryness: Instill 1 drop into both eyes in the morning   Ventolin HFA 108 (90 Base) MCG/ACT inhaler Generic drug: albuterol Inhale 2 puffs into the lungs every 6 (six) hours as needed for wheezing or shortness of breath.        Consultations: Gastroenterology  Procedures/Studies:   MR 3D Recon At Scanner  Result Date: 07/29/2022 CLINICAL DATA:  72 year old female with suspected biliary obstruction. EXAM: MRI ABDOMEN WITHOUT CONTRAST  (INCLUDING MRCP) TECHNIQUE: Multiplanar multisequence MR imaging of the abdomen was performed. Heavily T2-weighted images of the biliary and pancreatic ducts were obtained, and three-dimensional MRCP images were rendered by post processing. COMPARISON:  Abdominal MRI 11/26/2016. FINDINGS: Comment: Today's  study is limited for detection and characterization of visceral and/or vascular lesions by lack of IV gadolinium. Lower chest: Unremarkable. Hepatobiliary: Mild diffuse loss of signal intensity throughout the hepatic parenchyma on out of phase dual echo images indicative of a background of hepatic steatosis. In the periphery of the right lobe of the liver between segments 4B and 5 (axial image 16 of series 4 and coronal image 11 of series 5) there is a 9 x 5 x 17 mm T1 hypointense, T2 hyperintense lesion which is incompletely characterized on today's noncontrast examination, but was previously characterized as a cavernous hemangioma. No other definite new suspicious appearing hepatic lesions are noted on today's noncontrast examination. Moderate intrahepatic and severe extrahepatic biliary ductal dilatation is noted, with the proximal common bile duct measuring up to 19 mm in diameter. This has minimally increased compared to the prior study. No filling defect within the common bile duct noted on MRCP images to suggest the presence of choledocholithiasis. The duct is dilated all the way to the level of the ampulla. No definite ampullary mass confidently identified on  today's noncontrast examination. Findings are favored to reflect post cholecystectomy physiology with reservoir effect. Pancreas: No definite pancreatic mass confidently identified on today's noncontrast examination. No pancreatic ductal dilatation noted on MRCP images. No peripancreatic fluid collections or inflammatory changes. Spleen:  Unremarkable. Adrenals/Urinary Tract: There are T1 hypointense and T2 hyperintense lesions in both kidneys, similar to the prior examination, incompletely characterized on today's study, but previously characterized as simple cysts (Bosniak class 1) which requiring no imaging follow-up, the largest of which is in the interpolar region of the left kidney measuring 2 cm on today's examination. No definite suspicious renal lesions otherwise noted. No hydroureteronephrosis in the visualized portions of the abdomen. Bilateral adrenal glands are normal in appearance. Stomach/Bowel: Visualized portions are unremarkable. Vascular/Lymphatic: No aneurysm identified in the visualized abdominal vasculature. No lymphadenopathy noted in the abdomen. Other: No significant volume of ascites noted in the visualized portions of the peritoneal cavity. Musculoskeletal: No aggressive appearing osseous lesions are noted in the visualized portions of the skeleton. IMPRESSION: 1. Although there is substantial intra and extrahepatic biliary ductal dilatation, this is only minimally increased compared to the prior study, and is presumably reflective of post cholecystectomy physiology with reservoir effect. No source of obstruction identified on today's examination. 2. Mild hepatic steatosis. 3. Additional incidental findings, similar to the prior study, as above. Electronically Signed   By: Trudie Reedaniel  Entrikin [name]   On: 07/29/2022 08:18   MR ABDOMEN MRCP WO CONTRAST  Result Date: 07/29/2022 CLINICAL DATA:  72 year old female with suspected biliary obstruction. EXAM: MRI ABDOMEN WITHOUT CONTRAST  (INCLUDING  MRCP) TECHNIQUE: Multiplanar multisequence MR imaging of the abdomen was performed. Heavily T2-weighted images of the biliary and pancreatic ducts were obtained, and three-dimensional MRCP images were rendered by post processing. COMPARISON:  Abdominal MRI 11/26/2016. FINDINGS: Comment: Today's study is limited for detection and characterization of visceral and/or vascular lesions by lack of IV gadolinium. Lower chest: Unremarkable. Hepatobiliary: Mild diffuse loss of signal intensity throughout the hepatic parenchyma on out of phase dual echo images indicative of a background of hepatic steatosis. In the periphery of the right lobe of the liver between segments 4B and 5 (axial image 16 of series 4 and coronal image 11 of series 5) there is a 9 x 5 x 17 mm T1 hypointense, T2 hyperintense lesion which is incompletely characterized on today's noncontrast examination, but was previously characterized as a cavernous hemangioma.  No other definite new suspicious appearing hepatic lesions are noted on today's noncontrast examination. Moderate intrahepatic and severe extrahepatic biliary ductal dilatation is noted, with the proximal common bile duct measuring up to 19 mm in diameter. This has minimally increased compared to the prior study. No filling defect within the common bile duct noted on MRCP images to suggest the presence of choledocholithiasis. The duct is dilated all the way to the level of the ampulla. No definite ampullary mass confidently identified on today's noncontrast examination. Findings are favored to reflect post cholecystectomy physiology with reservoir effect. Pancreas: No definite pancreatic mass confidently identified on today's noncontrast examination. No pancreatic ductal dilatation noted on MRCP images. No peripancreatic fluid collections or inflammatory changes. Spleen:  Unremarkable. Adrenals/Urinary Tract: There are T1 hypointense and T2 hyperintense lesions in both kidneys, similar to the  prior examination, incompletely characterized on today's study, but previously characterized as simple cysts (Bosniak class 1) which requiring no imaging follow-up, the largest of which is in the interpolar region of the left kidney measuring 2 cm on today's examination. No definite suspicious renal lesions otherwise noted. No hydroureteronephrosis in the visualized portions of the abdomen. Bilateral adrenal glands are normal in appearance. Stomach/Bowel: Visualized portions are unremarkable. Vascular/Lymphatic: No aneurysm identified in the visualized abdominal vasculature. No lymphadenopathy noted in the abdomen. Other: No significant volume of ascites noted in the visualized portions of the peritoneal cavity. Musculoskeletal: No aggressive appearing osseous lesions are noted in the visualized portions of the skeleton. IMPRESSION: 1. Although there is substantial intra and extrahepatic biliary ductal dilatation, this is only minimally increased compared to the prior study, and is presumably reflective of post cholecystectomy physiology with reservoir effect. No source of obstruction identified on today's examination. 2. Mild hepatic steatosis. 3. Additional incidental findings, similar to the prior study, as above. Electronically Signed   By: Trudie Reed [name]   On: 07/29/2022 05:31   CT Head Wo Contrast  Result Date: 07/28/2022 CLINICAL DATA:  Mental status change dizzy EXAM: CT HEAD WITHOUT CONTRAST TECHNIQUE: Contiguous axial images were obtained from the base of the skull through the vertex without intravenous contrast. RADIATION DOSE REDUCTION: This exam was performed according to the departmental dose-optimization program which includes automated exposure control, adjustment of the mA and/or kV according to patient size and/or use of iterative reconstruction technique. COMPARISON:  CT brain 06/28/2010 FINDINGS: Brain: No acute territorial infarction, hemorrhage or intracranial mass. Mild atrophy. Mild  white matter hypodensity likely chronic small vessel ischemic change. Nonenlarged ventricles. Vascular: No hyperdense vessels.  Carotid vascular calcification Skull: Normal. Negative for fracture or focal lesion. Sinuses/Orbits: Postsurgical changes of the maxillary sinuses. Mild mucosal thickening Other: None IMPRESSION: 1. No CT evidence for acute intracranial abnormality. 2. Atrophy and mild chronic small vessel ischemic changes of the white matter. Electronically Signed   By: Jasmine Pang [name]   On: 07/28/2022 19:35   CT ABDOMEN PELVIS WO CONTRAST  Result Date: 07/28/2022 CLINICAL DATA:  Diarrhea EXAM: CT ABDOMEN AND PELVIS WITHOUT CONTRAST TECHNIQUE: Multidetector CT imaging of the abdomen and pelvis was performed following the standard protocol without IV contrast. RADIATION DOSE REDUCTION: This exam was performed according to the departmental dose-optimization program which includes automated exposure control, adjustment of the mA and/or kV according to patient size and/or use of iterative reconstruction technique. COMPARISON:  CT 10/11/2016 FINDINGS: Lower chest: Lung bases demonstrate emphysema. No acute airspace disease. Coronary vascular calcification ascending aortic diameter up to 4.2 cm. Hepatobiliary: Status post cholecystectomy. Increased dilatation  of common bile duct measuring up to 2.1 cm, previously 1.2 cm. Pancreas: Unremarkable. No pancreatic ductal dilatation or surrounding inflammatory changes. Spleen: Normal in size without focal abnormality. Adrenals/Urinary Tract: Adrenal glands are normal. Kidneys show no hydronephrosis. Probable cyst midpole left kidney, no imaging follow-up is recommended. The bladder is grossly unremarkable Stomach/Bowel: Stomach nonenlarged. No dilated small bowel. Question mild asymmetric cecal wall thickening or filling defect, series 7, image 74 and 71. Vascular/Lymphatic: Advanced aortic atherosclerosis. No aneurysm. No suspicious lymph nodes. Reproductive:  Hysterectomy.  No adnexal mass Other: Negative for pelvic effusion or free air. Musculoskeletal: Old right posterior rib fractures. No acute osseous abnormality IMPRESSION: 1. No definite CT evidence for acute intra-abdominal or pelvic abnormality. 2. Question mild asymmetric cecal wall thickening or intraluminal filling defect/mass. Suggest correlation with direct visualization/colonoscopy. 3. Increased dilatation of common bile duct measuring up to 2.1 cm, previously 1.2 cm. This may be correlated with LFTs with follow-up MRCP as indicated. 4. Mphysema. 5. Aortic atherosclerosis. Aortic Atherosclerosis (ICD10-I70.0) and Emphysema (ICD10-J43.9). Electronically Signed   By: Jasmine Pang [name]   On: 07/28/2022 19:32       The results of significant diagnostics from this hospitalization (including imaging, microbiology, ancillary and laboratory) are listed below for reference.     Microbiology: Recent Results (from the past 240 hour(s))  Gastrointestinal Panel by PCR , Stool     Status: None   Collection Time: 07/28/22  8:11 PM   Specimen: Stool  Result Value Ref Range Status   Campylobacter species NOT DETECTED NOT DETECTED Final   Plesimonas shigelloides NOT DETECTED NOT DETECTED Final   Salmonella species NOT DETECTED NOT DETECTED Final   Yersinia enterocolitica NOT DETECTED NOT DETECTED Final   Vibrio species NOT DETECTED NOT DETECTED Final   Vibrio cholerae NOT DETECTED NOT DETECTED Final   Enteroaggregative E coli (EAEC) NOT DETECTED NOT DETECTED Final   Enteropathogenic E coli (EPEC) NOT DETECTED NOT DETECTED Final   Enterotoxigenic E coli (ETEC) NOT DETECTED NOT DETECTED Final   Shiga like toxin producing E coli (STEC) NOT DETECTED NOT DETECTED Final   Shigella/Enteroinvasive E coli (EIEC) NOT DETECTED NOT DETECTED Final   Cryptosporidium NOT DETECTED NOT DETECTED Final   Cyclospora cayetanensis NOT DETECTED NOT DETECTED Final   Entamoeba histolytica NOT DETECTED NOT DETECTED Final    Giardia lamblia NOT DETECTED NOT DETECTED Final   Adenovirus F40/41 NOT DETECTED NOT DETECTED Final   Astrovirus NOT DETECTED NOT DETECTED Final   Norovirus GI/GII NOT DETECTED NOT DETECTED Final   Rotavirus A NOT DETECTED NOT DETECTED Final   Sapovirus (I, II, IV, and V) NOT DETECTED NOT DETECTED Final    Comment: Performed at [facility], [address] [place], [place] [address]  C Difficile Quick Screen w PCR reflex     Status: None   Collection Time: 07/28/22  8:36 PM   Specimen: Stool  Result Value Ref Range Status   C Diff antigen NEGATIVE NEGATIVE Final   C Diff toxin NEGATIVE NEGATIVE Final   C Diff interpretation No C. difficile detected.  Final    Comment: Performed at [facility], [address], [place], [place] [address]     Labs:  CBC: Recent Labs  Lab 07/28/22 1821 07/29/22 0609 07/31/22 0701  WBC 11.8* 6.3 5.5  NEUTROABS  --  3.4  --   HGB 14.5 11.9* 12.4  HCT 41.6 35.0* 37.2  MCV 97.2 99.7 102.8*  PLT 241 204 208   BMP &  GFR Recent Labs  Lab 07/28/22 1821 07/29/22 0609 07/30/22 0546 07/30/22 0742 07/31/22 0701  NA 129* 132* 137 135 133*  K 2.6* 3.2* 5.6* 4.9 4.5  CL 87* 97* 104 102 101  CO2 29 25 26 27 26   GLUCOSE 111* 84 97 102* 106*  BUN 11 7* <5* <5* 9  CREATININE 1.02* 0.51 0.47 0.56 0.64  CALCIUM 9.1 7.8* 8.1* 8.1* 8.9  MG  --  1.6*  --  1.8 1.6*  PHOS  --  1.9*  --  2.6 3.5   Estimated Creatinine Clearance: 56.5 mL/min (by C-G formula based on SCr of 0.64 mg/dL). Liver & Pancreas: Recent Labs  Lab 07/28/22 1821 07/29/22 0609 07/31/22 0701  AST 48* 26  --   ALT 36 24  --   ALKPHOS 61 43  --   BILITOT 0.9 0.6  --   PROT 6.8 4.9*  --   ALBUMIN 4.1 3.0* 3.2*   Recent Labs  Lab 07/28/22 1821  LIPASE 25   No results for input(s): "AMMONIA" in the last 168 hours. Diabetic: No results for input(s): "HGBA1C" in the last 72 hours. Recent Labs  Lab 07/28/22 1739  GLUCAP 131*   Cardiac  Enzymes: Recent Labs  Lab 07/31/22 0701  CKTOTAL 42   No results for input(s): "PROBNP" in the last 8760 hours. Coagulation Profile: No results for input(s): "INR", "PROTIME" in the last 168 hours. Thyroid Function Tests: No results for input(s): "TSH", "T4TOTAL", "FREET4", "T3FREE", "THYROIDAB" in the last 72 hours. Lipid Profile: No results for input(s): "CHOL", "HDL", "LDLCALC", "TRIG", "CHOLHDL", "LDLDIRECT" in the last 72 hours. Anemia Panel: No results for input(s): "VITAMINB12", "FOLATE", "FERRITIN", "TIBC", "IRON", "RETICCTPCT" in the last 72 hours. Urine analysis:    Component Value Date/Time   COLORURINE YELLOW 07/28/2022 1737   APPEARANCEUR CLEAR 07/28/2022 1737   LABSPEC 1.005 07/28/2022 1737   PHURINE 5.0 07/28/2022 1737   GLUCOSEU NEGATIVE 07/28/2022 1737   HGBUR NEGATIVE 07/28/2022 1737   BILIRUBINUR NEGATIVE 07/28/2022 1737   KETONESUR 5 (A) 07/28/2022 1737   PROTEINUR NEGATIVE 07/28/2022 1737   UROBILINOGEN 0.2 07/25/2014 1630   NITRITE NEGATIVE 07/28/2022 1737   LEUKOCYTESUR NEGATIVE 07/28/2022 1737   Sepsis Labs: Invalid input(s): "PROCALCITONIN", "LACTICIDVEN"   SIGNED:  Almon Hercules, MD  [facility] 07/31/2022, 10:11 PM

## 2022-08-04 DIAGNOSIS — E871 Hypo-osmolality and hyponatremia: Secondary | ICD-10-CM | POA: Diagnosis not present

## 2022-08-04 DIAGNOSIS — Z09 Encounter for follow-up examination after completed treatment for conditions other than malignant neoplasm: Secondary | ICD-10-CM | POA: Diagnosis not present

## 2022-08-04 DIAGNOSIS — I10 Essential (primary) hypertension: Secondary | ICD-10-CM | POA: Diagnosis not present

## 2022-08-04 DIAGNOSIS — K52832 Lymphocytic colitis: Secondary | ICD-10-CM | POA: Diagnosis not present

## 2022-08-04 DIAGNOSIS — E876 Hypokalemia: Secondary | ICD-10-CM | POA: Diagnosis not present

## 2022-08-04 DIAGNOSIS — N179 Acute kidney failure, unspecified: Secondary | ICD-10-CM | POA: Diagnosis not present

## 2022-08-04 DIAGNOSIS — R197 Diarrhea, unspecified: Secondary | ICD-10-CM | POA: Diagnosis not present

## 2022-08-04 DIAGNOSIS — E538 Deficiency of other specified B group vitamins: Secondary | ICD-10-CM | POA: Diagnosis not present

## 2022-08-04 DIAGNOSIS — D72829 Elevated white blood cell count, unspecified: Secondary | ICD-10-CM | POA: Diagnosis not present

## 2022-08-16 DIAGNOSIS — I959 Hypotension, unspecified: Secondary | ICD-10-CM | POA: Diagnosis not present

## 2022-08-16 DIAGNOSIS — R933 Abnormal findings on diagnostic imaging of other parts of digestive tract: Secondary | ICD-10-CM | POA: Diagnosis not present

## 2022-08-16 DIAGNOSIS — K52832 Lymphocytic colitis: Secondary | ICD-10-CM | POA: Diagnosis not present

## 2022-08-17 ENCOUNTER — Other Ambulatory Visit: Payer: Self-pay | Admitting: Internal Medicine

## 2022-08-17 DIAGNOSIS — R790 Abnormal level of blood mineral: Secondary | ICD-10-CM | POA: Diagnosis not present

## 2022-08-17 DIAGNOSIS — K52832 Lymphocytic colitis: Secondary | ICD-10-CM | POA: Diagnosis not present

## 2022-08-17 DIAGNOSIS — I10 Essential (primary) hypertension: Secondary | ICD-10-CM | POA: Diagnosis not present

## 2022-08-17 LAB — LAB REPORT - SCANNED: EGFR: 98

## 2022-08-18 ENCOUNTER — Other Ambulatory Visit: Payer: Self-pay | Admitting: Internal Medicine

## 2022-08-19 DIAGNOSIS — E871 Hypo-osmolality and hyponatremia: Secondary | ICD-10-CM | POA: Diagnosis not present

## 2022-08-19 DIAGNOSIS — I10 Essential (primary) hypertension: Secondary | ICD-10-CM | POA: Diagnosis not present

## 2022-08-19 DIAGNOSIS — E876 Hypokalemia: Secondary | ICD-10-CM | POA: Diagnosis not present

## 2022-08-24 ENCOUNTER — Other Ambulatory Visit (HOSPITAL_COMMUNITY): Payer: Self-pay

## 2022-08-26 DIAGNOSIS — I10 Essential (primary) hypertension: Secondary | ICD-10-CM | POA: Diagnosis not present

## 2022-08-26 DIAGNOSIS — E871 Hypo-osmolality and hyponatremia: Secondary | ICD-10-CM | POA: Diagnosis not present

## 2022-09-02 DIAGNOSIS — I10 Essential (primary) hypertension: Secondary | ICD-10-CM | POA: Diagnosis not present

## 2022-09-02 DIAGNOSIS — E871 Hypo-osmolality and hyponatremia: Secondary | ICD-10-CM | POA: Diagnosis not present

## 2022-09-09 NOTE — Progress Notes (Signed)
   Cardiology Clinic Note   Date: 09/10/2022 ID: [name], DOB [birth year], MRN [id number]  Primary Cardiologist:  Chrystie Nose, MD  Patient Profile    [name] is a 72 y.o. Melanie who presents to the clinic today for evaluation of BP.   Past medical history significant for: Thoracic ascending aortic aneurysm. Echo 06/07/2019: EF 55 to 60%.  Grade I DD.  Normal RV function.  Moderate dilatation of ascending aorta 39 mm. NSVT. Hypertension. Hyperlipidemia. Lipid panel 04/28/2022: LDL 83, HDL 82, TG 127, total 190. Lung cancer. S/p right upper lobectomy. TIA. Tobacco abuse.    History of Present Illness    [name] is a longtime patient of cardiology.  She is followed by Dr. Rennis Golden for the above outlined history.  She was last seen in the office by Edd Fabian, NP on 07/29/2021 for follow-up.  Patient was doing well from a cardiac standpoint.  She did complain of insomnia sleeping from 4 AM until 4 PM.  She was otherwise stable and no medication changes were made.  Today, patient reports she was in the hospital in April for dizziness and electrolyte derangement. Some of her antihypertensives were stopped in the hospital. She has been working closely with her PCP to get medications back on track.  Initially BP was low and she was told to stop amlodipine and Micardis.  When BP started to creep back up she was restarted on amlodipine at 2.5 mg daily.  BP was not well-controlled so amlodipine was increased to 5 mg daily.  Patient does not typically get up until about 1:00 in the afternoon.  Her blood pressures upon awakening have been between 134-167/74-95.  Evening BPs were 157/90 prior to increasing amlodipine and then decreased to 125/77.  She is concerned about not taking the Micardis as she felt she had better control with it.  HCTZ should not be started secondary to continued issues with hyponatremia.  She otherwise is doing well with no complaints.    ROS: All  other systems reviewed and are otherwise negative except as noted in History of Present Illness.  Studies Reviewed    ECG is not ordered today.  Physical Exam    VS:  BP 116/80   Pulse 78   Ht 5\' 4"  (1.626 m)   Wt 118 lb 9.6 oz (53.8 kg)   SpO2 91%   BMI 20.36 kg/m  , BMI Body mass index is 20.36 kg/m.  GEN: Well nourished, well developed, in no acute distress. Neck: No JVD or carotid bruits. Cardiac:  RRR. No murmurs. No rubs or gallops.   Respiratory:  Respirations regular and unlabored. Clear to auscultation without rales, wheezing or rhonchi. GI: Soft, nontender, nondistended. Extremities: Radials/DP/PT 2+ and equal bilaterally. No clubbing or cyanosis. No edema.  Skin: Warm and dry, no rash. Neuro: Strength intact.  Assessment & Plan    Hypertension. BP today 116/80.  Given home readings consistently >130/80 will start irbesartan 150 mg to take in the evenings.  Continue amlodipine, metoprolol in the morning.  Continue to monitor BP.  Patient will return in approximately 1 week for BMP. Hyperlipidemia.  LDL January 2024 83.  Continue Crestor.  Disposition: Irbesartan 150 mg in the evenings.  Continue monitoring blood pressure.  BMP in 1 week.  Return in 3 months or sooner as needed.         Signed, Etta Grandchild. Brean Carberry, DNP, NP-C

## 2022-09-10 ENCOUNTER — Other Ambulatory Visit: Payer: Self-pay

## 2022-09-10 ENCOUNTER — Ambulatory Visit: Payer: Medicare PPO | Attending: Student | Admitting: Student

## 2022-09-10 ENCOUNTER — Encounter: Payer: Self-pay | Admitting: Student

## 2022-09-10 VITALS — BP 116/80 | HR 78 | Ht 64.0 in | Wt 118.6 lb

## 2022-09-10 DIAGNOSIS — I10 Essential (primary) hypertension: Secondary | ICD-10-CM

## 2022-09-10 DIAGNOSIS — E782 Mixed hyperlipidemia: Secondary | ICD-10-CM | POA: Diagnosis not present

## 2022-09-10 MED ORDER — IRBESARTAN 150 MG PO TABS: 150.0000 mg | ORAL_TABLET | Freq: Every day | ORAL | 3 refills | Status: DC

## 2022-09-10 NOTE — Patient Instructions (Signed)
Medication Instructions:  Stop Micardis Start Irbesartan 150 mg daily  Continue all other medications *If you need a refill on your cardiac medications before your next appointment, please call your pharmacy*   Lab Work: Bmet in 1 week    Testing/Procedures: None ordered   Follow-Up: At [facility], you and your health needs are our priority.  As part of our continuing mission to provide you with exceptional heart care, we have created designated Provider Care Teams.  These Care Teams include your primary Cardiologist (physician) and Advanced Practice Providers (APPs -  Physician Assistants and Nurse Practitioners) who all work together to provide you with the care you need, when you need it.  We recommend signing up for the patient portal called "MyChart".  Sign up information is provided on this After Visit Summary.  MyChart is used to connect with patients for Virtual Visits (Telemedicine).  Patients are able to view lab/test results, encounter notes, upcoming appointments, etc.  Non-urgent messages can be sent to your provider as well.   To learn more about what you can do with MyChart, go to [email or web address].    Your next appointment:  3 months    Provider:  Dr.Hilty

## 2022-09-15 DIAGNOSIS — L570 Actinic keratosis: Secondary | ICD-10-CM | POA: Diagnosis not present

## 2022-09-15 DIAGNOSIS — D692 Other nonthrombocytopenic purpura: Secondary | ICD-10-CM | POA: Diagnosis not present

## 2022-09-15 DIAGNOSIS — L578 Other skin changes due to chronic exposure to nonionizing radiation: Secondary | ICD-10-CM | POA: Diagnosis not present

## 2022-09-15 DIAGNOSIS — D1801 Hemangioma of skin and subcutaneous tissue: Secondary | ICD-10-CM | POA: Diagnosis not present

## 2022-09-15 DIAGNOSIS — Z411 Encounter for cosmetic surgery: Secondary | ICD-10-CM | POA: Diagnosis not present

## 2022-09-15 DIAGNOSIS — L821 Other seborrheic keratosis: Secondary | ICD-10-CM | POA: Diagnosis not present

## 2022-09-15 DIAGNOSIS — L853 Xerosis cutis: Secondary | ICD-10-CM | POA: Diagnosis not present

## 2022-09-15 DIAGNOSIS — D229 Melanocytic nevi, unspecified: Secondary | ICD-10-CM | POA: Diagnosis not present

## 2022-09-15 DIAGNOSIS — L814 Other melanin hyperpigmentation: Secondary | ICD-10-CM | POA: Diagnosis not present

## 2022-09-17 DIAGNOSIS — I10 Essential (primary) hypertension: Secondary | ICD-10-CM | POA: Diagnosis not present

## 2022-09-18 ENCOUNTER — Other Ambulatory Visit: Payer: Self-pay | Admitting: Internal Medicine

## 2022-09-18 LAB — BASIC METABOLIC PANEL
BUN/Creatinine Ratio: 16 (ref 12–28)
BUN: 9 mg/dL (ref 8–27)
CO2: 29 mmol/L (ref 20–29)
Calcium: 9.2 mg/dL (ref 8.7–10.3)
Chloride: 102 mmol/L (ref 96–106)
Creatinine, Ser: 0.55 mg/dL — ABNORMAL LOW (ref 0.57–1.00)
Glucose: 94 mg/dL (ref 70–99)
Potassium: 3.8 mmol/L (ref 3.5–5.2)
Sodium: 146 mmol/L — ABNORMAL HIGH (ref 134–144)
eGFR: 98 mL/min/{1.73_m2} (ref >59)

## 2022-09-27 ENCOUNTER — Other Ambulatory Visit (HOSPITAL_COMMUNITY): Payer: Self-pay

## 2022-10-12 DIAGNOSIS — H524 Presbyopia: Secondary | ICD-10-CM | POA: Diagnosis not present

## 2022-10-12 DIAGNOSIS — H353132 Nonexudative age-related macular degeneration, bilateral, intermediate dry stage: Secondary | ICD-10-CM | POA: Diagnosis not present

## 2022-10-12 DIAGNOSIS — H26491 Other secondary cataract, right eye: Secondary | ICD-10-CM | POA: Diagnosis not present

## 2022-10-12 DIAGNOSIS — Z961 Presence of intraocular lens: Secondary | ICD-10-CM | POA: Diagnosis not present

## 2022-10-12 DIAGNOSIS — H02889 Meibomian gland dysfunction of unspecified eye, unspecified eyelid: Secondary | ICD-10-CM | POA: Diagnosis not present

## 2022-10-12 DIAGNOSIS — H52223 Regular astigmatism, bilateral: Secondary | ICD-10-CM | POA: Diagnosis not present

## 2022-10-18 ENCOUNTER — Other Ambulatory Visit (HOSPITAL_COMMUNITY): Payer: Self-pay

## 2022-10-18 DIAGNOSIS — G4700 Insomnia, unspecified: Secondary | ICD-10-CM | POA: Diagnosis not present

## 2022-10-18 DIAGNOSIS — K52832 Lymphocytic colitis: Secondary | ICD-10-CM | POA: Diagnosis not present

## 2022-10-20 ENCOUNTER — Other Ambulatory Visit (HOSPITAL_COMMUNITY): Payer: Self-pay

## 2022-10-20 MED ORDER — BUDESONIDE 3 MG PO CPEP
9.0000 mg | ORAL_CAPSULE | Freq: Every day | ORAL | 0 refills | Status: DC
  Filled 2022-10-20: qty 90, 30d supply, fill #0

## 2022-11-04 ENCOUNTER — Telehealth: Payer: Self-pay | Admitting: Internal Medicine

## 2022-11-04 DIAGNOSIS — K529 Noninfective gastroenteritis and colitis, unspecified: Secondary | ICD-10-CM | POA: Diagnosis not present

## 2022-11-04 DIAGNOSIS — K449 Diaphragmatic hernia without obstruction or gangrene: Secondary | ICD-10-CM | POA: Diagnosis not present

## 2022-11-04 DIAGNOSIS — K562 Volvulus: Secondary | ICD-10-CM | POA: Diagnosis not present

## 2022-11-04 DIAGNOSIS — K5730 Diverticulosis of large intestine without perforation or abscess without bleeding: Secondary | ICD-10-CM | POA: Diagnosis not present

## 2022-11-04 DIAGNOSIS — K229 Disease of esophagus, unspecified: Secondary | ICD-10-CM | POA: Diagnosis not present

## 2022-11-04 NOTE — Telephone Encounter (Signed)
CRNA Delaney Meigs, states patient has abnormal rhythm while under anesthesia. Patient needs to follow up with cardiologist. She will give her the strip to bring to office. Vitals stable pre and post procedure. Patient is asymptomatic.

## 2022-11-17 ENCOUNTER — Other Ambulatory Visit: Payer: Self-pay | Admitting: Internal Medicine

## 2022-11-19 ENCOUNTER — Ambulatory Visit: Payer: Medicare PPO | Attending: Nurse Practitioner | Admitting: Nurse Practitioner

## 2022-11-19 ENCOUNTER — Encounter: Payer: Self-pay | Admitting: Nurse Practitioner

## 2022-11-19 VITALS — BP 128/78 | HR 87 | Ht 64.0 in | Wt 119.0 lb

## 2022-11-19 DIAGNOSIS — Z720 Tobacco use: Secondary | ICD-10-CM

## 2022-11-19 DIAGNOSIS — I10 Essential (primary) hypertension: Secondary | ICD-10-CM

## 2022-11-19 DIAGNOSIS — I491 Atrial premature depolarization: Secondary | ICD-10-CM

## 2022-11-19 DIAGNOSIS — E782 Mixed hyperlipidemia: Secondary | ICD-10-CM | POA: Diagnosis not present

## 2022-11-19 DIAGNOSIS — I77810 Thoracic aortic ectasia: Secondary | ICD-10-CM | POA: Diagnosis not present

## 2022-11-19 DIAGNOSIS — I493 Ventricular premature depolarization: Secondary | ICD-10-CM | POA: Diagnosis not present

## 2022-11-19 DIAGNOSIS — I4729 Other ventricular tachycardia: Secondary | ICD-10-CM | POA: Diagnosis not present

## 2022-11-19 NOTE — Patient Instructions (Signed)
Medication Instructions:  Your physician recommends that you continue on your current medications as directed. Please refer to the Current Medication list given to you today.  *If you need a refill on your cardiac medications before your next appointment, please call your pharmacy*   Lab Work: TODAY: BMET If you have labs (blood work) drawn today and your tests are completely normal, you will receive your results only by: MyChart Message (if you have MyChart) OR A paper copy in the mail If you have any lab test that is abnormal or we need to change your treatment, we will call you to review the results.   Testing/Procedures: NONE   Follow-Up: At [facility], you and your health needs are our priority.  As part of our continuing mission to provide you with exceptional heart care, we have created designated Provider Care Teams.  These Care Teams include your primary Cardiologist (physician) and Advanced Practice Providers (APPs -  Physician Assistants and Nurse Practitioners) who all work together to provide you with the care you need, when you need it.  We recommend signing up for the patient portal called "MyChart".  Sign up information is provided on this After Visit Summary.  MyChart is used to connect with patients for Virtual Visits (Telemedicine).  Patients are able to view lab/test results, encounter notes, upcoming appointments, etc.  Non-urgent messages can be sent to your provider as well.   To learn more about what you can do with MyChart, go to [email or web address].    Your next appointment:   6 month(s)  Provider:   Chrystie Nose, MD

## 2022-11-19 NOTE — Progress Notes (Signed)
Office Visit    Patient Name: [name] Date of Encounter: 11/19/2022  Primary Care Provider:  Fatima Sanger, FNP Primary Cardiologist:  Chrystie Nose, MD  Chief Complaint    72 year old female with a history of ascending aortic dilation, NSVT, hypertension, hyperlipidemia, TIA, lung cancer s/p right upper lobectomy, and tobacco use who presents for follow-up related to hypertension and abnormal EKG.   Past Medical History    Past Medical History:  Diagnosis Date   Active smoker    per pt quit 01/ 2021 smoking prior to lung lobectomy 02/ 2021 but started back smoking 06/ 2022 stated 5 cig per day   Asthma    Chronic diarrhea    Complication of anesthesia    hard to wake and ponv   Dyslipidemia    Emphysema/COPD (HCC)    pulmologist--- dr Francine Graven;   no oxygen   Endometriosis    Family history of adverse reaction to anesthesia    brother--- slow to wake   Finger fracture, left    left ring and small proximal phalagel fx's   GAD (generalized anxiety disorder)    GERD (gastroesophageal reflux disease)    Hiatal hernia    History of cervical dysplasia    s/p gyn crysurgery yrs ago   History of gastric ulcer 05/2019   post lung lobectomy w/ esophagitis   History of TIA (transient ischemic attack)    12-22-2021  per pt remote TIA > 10 yrs ago,  no residual   Hypertension    followed by cardiologist and pcp;   cardiac cath 11-20-2002 completely normal;   last nuclear stress test 08-14-2010 low risk normal perfusion no ischemia, nuclear ef 59%   Intermittent palpitations    followed by dr hilty   Lymphocytic colitis    followed by [facility]   Macular degeneration of both eyes    Migraine    Non-small cell lung cancer, right (HCC) 05/2019   surgeon-- dr Dorris Fetch / oncologist-- dr Arbutus Ped  pulmologist-- dr j. Francine Graven;   05-28-2019  s/p  right upper lobectomy w/ node dissection's,  Stage IA,  no chemo or radiation   NSVT (nonsustained ventricular tachycardia)  (HCC)    hx bigemy  and psot op wide complex tachycardia s/p lung lobectomy 02/ 2021   OSA (obstructive sleep apnea)    does not wear her CPAP because "it gave me respiratory issues (bronchities and pneumonia)"   Osteoporosis    PONV (postoperative nausea and vomiting)    Secondary polycythemia    evaluation by dr Duffy Rhody note in epic 11-03-2015  per note due to tobacco use and respiratory issues   Thoracic ascending aortic aneurysm (HCC) 02/02/2022   Past Surgical History:  Procedure Laterality Date   BIOPSY  06/05/2019   Procedure: BIOPSY;  Surgeon: Jeani Hawking, MD;  Location: [facility] ENDOSCOPY;  Service: Endoscopy;;   CARDIAC CATHETERIZATION  11/20/2002   @[facility]  by dr gamble;   completey normal;  patent coronary arteries   CATARACT EXTRACTION W/ INTRAOCULAR LENS IMPLANT Bilateral 08/2020   ESOPHAGOGASTRODUODENOSCOPY Left 06/05/2019   Procedure: ESOPHAGOGASTRODUODENOSCOPY (EGD);  Surgeon: Jeani Hawking, MD;  Location: [facility] ENDOSCOPY;  Service: Endoscopy;  Laterality: Left;   GYNECOLOGIC CRYOSURGERY     many yrs ago   INTERCOSTAL NERVE BLOCK Right 05/28/2019   Procedure: Intercostal Nerve Block;  Surgeon: Loreli Slot, MD;  Location: [facility] OR;  Service: Thoracic;  Laterality: Right;   LAPAROSCOPIC CHOLECYSTECTOMY  10/15/2002   @[facility]   LUMBAR DISC SURGERY  08/15/2000   @[facility]  by dr Newell Coral;   left L3--4   (and also lumbar surgery approx. 1989)   NASAL SINUS SURGERY  2004   approx   NODE DISSECTION  05/28/2019   Procedure: Node Dissection;  Surgeon: Loreli Slot, MD;  Location: [facility] OR;  Service: Thoracic;;   OPEN REDUCTION INTERNAL FIXATION (ORIF) PROXIMAL PHALANX Left 12/29/2021   Procedure: Left ring and small finger proximal phalangeal closed reduction percutaneous pinning;  Surgeon: Gomez Cleverly, MD;  Location: [facility] ;  Service: Orthopedics;  Laterality: Left;   ROTATOR CUFF REPAIR Left    x2  last one  1990s   THORACOSCOPY  05/28/2019   THORASCOPY-WEDGE  RESECTION AND  RIGHT  UPPER LOBECTOMY (Right)   THROAT SURGERY  1997   REMOVAL OF TUMOR  (PER PT BENIGN)   TONSILLECTOMY     age 57   VAGINAL HYSTERECTOMY  2000   w/  BILATERAL SALPINGOOPHORECTOMY    Allergies  Allergies  Allergen Reactions   Iodinated Contrast Media Itching and Other (See Comments)    itching but no hives just after iv contrast injection w/ 13 hr prep, future contrast not advised unless absolutely necessary, 13 hr prep would still be advised.//a.[email or web address]   Ioxaglate Itching and Other (See Comments)    itching but no hives just after iv contrast injection w/ 13 hr prep, future contrast not advised unless absolutely necessary, 13 hr prep would still be advised.//a.calhoun   Omnipaque [Iohexol] Anaphylaxis   Buprenorphine Hcl Nausea And Vomiting   Codeine Nausea And Vomiting   Erythromycin Other (See Comments)    Hospitalized for 5 days after receiving too much   Morphine And Codeine Nausea And Vomiting   Nicoderm [Nicotine] Itching and Other (See Comments)    Patches cause itching   Oyster Extract Nausea And Vomiting and Other (See Comments)    "I went into shock"     Labs/Other Studies Reviewed    The following studies were reviewed today:  Cardiac Studies & Procedures     STRESS TESTS  MYOCARDIAL PERFUSION IMAGING 08/14/2019  Narrative  The left ventricular ejection fraction is normal (55-65%).  Nuclear stress EF: 59%.  There was no ST segment deviation noted during stress.  The study is normal.  This is a low risk study.  No change from prior study.   ECHOCARDIOGRAM  ECHOCARDIOGRAM COMPLETE 06/07/2019  Narrative ECHOCARDIOGRAM REPORT    Patient Name:   [name] Date of Exam: 06/07/2019 Medical Rec #:  [id number]       Height:       64.0 in Accession #:    [id number]      Weight:       108.0 lb Date of Birth:  01-Apr-1951       BSA:          1.51 m Patient Age:    68 years        BP:           119/70 mmHg Patient Gender: F                HR:           71 bpm. Exam Location:  Inpatient  Procedure: 2D Echo, Cardiac Doppler and Color Doppler  Indications:    R00.0 Tachycardia  History:        Patient has no prior history of Echocardiogram examinations. Risk Factors:Hypertension, Dyslipidemia, Current Smoker and Sleep Apnea.  Sonographer:  Tiffany Dance Referring Phys: [id number] CADENCE H FURTH  IMPRESSIONS   1. Left ventricular ejection fraction, by estimation, is 55 to 60%. The left ventricle has normal function. The left ventrical has no regional wall motion abnormalities. Left ventricular diastolic parameters are consistent with Grade I diastolic dysfunction (impaired relaxation). 2. Right ventricular systolic function is normal. The right ventricular size is normal. 3. The mitral valve is normal in structure and function. no evidence of mitral valve regurgitation. No evidence of mitral stenosis. 4. The aortic valve is normal in structure and function. Aortic valve regurgitation is not visualized. No aortic stenosis is present. 5. Aortic dilatation noted. There is moderate dilatation of the ascending aorta measuring 39 mm.  FINDINGS Left Ventricle: Left ventricular ejection fraction, by estimation, is 55 to 60%. The left ventricle has normal function. The left ventricle has no regional wall motion abnormalities. There is no left ventricular hypertrophy. Left ventricular diastolic parameters are consistent with Grade I diastolic dysfunction (impaired relaxation).  Right Ventricle: The right ventricular size is normal. No increase in right ventricular wall thickness. Right ventricular systolic function is normal.  Left Atrium: Left atrial size was normal in size.  Right Atrium: Right atrial size was normal in size.  Pericardium: There is no evidence of pericardial effusion.  Mitral Valve: The mitral valve is normal in structure and function. No evidence of mitral valve regurgitation. No evidence of mitral  valve stenosis.  Tricuspid Valve: The tricuspid valve is grossly normal. Tricuspid valve regurgitation is trivial.  Aortic Valve: The aortic valve is normal in structure and function. Aortic valve regurgitation is not visualized. No aortic stenosis is present.  Pulmonic Valve: The pulmonic valve was grossly normal. Pulmonic valve regurgitation is trivial.  Aorta: The aortic root, ascending aorta and aortic arch are all structurally normal, with no evidence of dilitation or obstruction and aortic dilatation noted. There is moderate dilatation of the ascending aorta measuring 39 mm.  IAS/Shunts: The atrial septum is grossly normal.   LEFT VENTRICLE PLAX 2D LVIDd:         4.00 cm  Diastology LVIDs:         3.50 cm  LV e' lateral:   7.51 cm/s LV PW:         0.80 cm  LV E/e' lateral: 8.0 LV IVS:        0.90 cm  LV e' medial:    4.68 cm/s LVOT diam:     2.10 cm  LV E/e' medial:  12.8 LV SV:         58.88 ml LV SV Index:   12.92 LVOT Area:     3.46 cm   RIGHT VENTRICLE             IVC RV Basal diam:  2.40 cm     IVC diam: 1.10 cm RV S prime:     14.10 cm/s TAPSE (M-mode): 1.8 cm  LEFT ATRIUM             Index       RIGHT ATRIUM          Index LA diam:        2.20 cm 1.46 cm/m  RA Area:     9.27 cm LA Vol (A2C):   16.2 ml 10.76 ml/m RA Volume:   16.00 ml 10.63 ml/m LA Vol (A4C):   22.8 ml 15.14 ml/m LA Biplane Vol: 19.3 ml 12.82 ml/m AORTIC VALVE LVOT Vmax:   82.50 cm/s LVOT Vmean:  46.900  cm/s LVOT VTI:    0.170 m  AORTA Ao Root diam: 3.30 cm Ao Asc diam:  3.90 cm  MITRAL VALVE MV Area (PHT): 2.69 cm             SHUNTS MV Decel Time: 282 msec             Systemic VTI:  0.17 m MV E velocity: 60.00 cm/s 103 cm/s  Systemic Diam: 2.10 cm MV A velocity: 93.40 cm/s 70.3 cm/s MV E/A ratio:  0.64       1.5  Kristeen Miss MD Electronically signed by Kristeen Miss MD Signature Date/Time: 06/07/2019/4:57:56 PM    Final            Recent Labs: 07/29/2022: ALT  24 07/31/2022: Hemoglobin 12.4; Magnesium 1.6; Platelets 208 09/17/2022: BUN 9; Creatinine, Ser 0.55; Potassium 3.8; Sodium 146  Recent Lipid Panel    Component Value Date/Time   CHOL 93 06/08/2019 0116   TRIG 120 06/08/2019 0116   HDL 25 (L) 06/08/2019 0116   CHOLHDL 3.7 06/08/2019 0116   VLDL 24 06/08/2019 0116   LDLCALC 44 06/08/2019 0116    History of Present Illness    72 year old female with the above past medical history including ascending aorta dilaiton, NSVT, hypertension, hyperlipidemia, TIA, lung cancer s/p right upper lobectomy, and tobacco use.  Echocardiogram in February 2021 showed EF 65 to 60%, G1 DD, normal LV function, moderate dilation of ascending aorta measuring 39 mm.  Most recent CT of the chest in 01/2022 showed ascending thoracic aorta dilation, 4.4 cm in diameter.  She was hospitalized in April 2024 in the setting of dizziness, electrolyte abnormalities.  Several of her antihypertensive medications were discontinued at that time.  She was last seen in the office on 09/10/2022 and was stable from a cardiac standpoint.  She reported intermittently elevated BP.  She was started on irbesartan 150 mg daily.  She called our office on 11/04/2022 with concern for have normal heart rhythm while under anesthesia.  She presents today for follow-up.  Since her last visit she has been stable from a cardiac standpoint.  Her BP has been stable.  She denies any palpitations, dizziness, presyncope, syncope, denies symptoms concerning for angina.  She reports low energy but states she does not sleep well most nights.  Otherwise, she reports feeling well.   Home Medications    Current Outpatient Medications  Medication Sig Dispense Refill   ALPRAZolam (XANAX) 0.5 MG tablet Take 0.5 mg by mouth 3 (three) times daily as needed for anxiety or sleep.     amLODipine (NORVASC) 2.5 MG tablet TAKE 1 TABLET BY MOUTH DAILY. (Patient taking differently: Take 2.5 mg by mouth in the morning and at  bedtime.) 180 tablet 1   aspirin EC 81 MG tablet Take 81 mg by mouth every other day.     bismuth subsalicylate (PEPTO BISMOL) 262 MG chewable tablet Chew 524 mg by mouth as needed for diarrhea or loose stools.     budesonide (ENTOCORT EC) 3 MG 24 hr capsule Take 3 capsules (9 mg total) by mouth daily. 90 capsule 0   CALCIUM PO Take 1 tablet by mouth 2 (two) times a week.     cholestyramine (QUESTRAN) 4 GM/DOSE powder Take 4 g by mouth daily as needed (for diarrhea).     denosumab (PROLIA) 60 MG/ML SOLN injection Inject 60 mg into the skin every 6 (six) months. Administer in upper arm, thigh, or abdomen     ergocalciferol (  VITAMIN D2) 1.25 MG (50000 UT) capsule Take 50,000 Units by mouth every Saturday.     eszopiclone 3 MG TABS Take 1 tablet (3 mg total) by mouth at bedtime as needed (for sleep- Take immediately before bedtime).  0   gabapentin (NEURONTIN) 300 MG capsule TAKE 2 CAPSULES(600 MG) BY MOUTH THREE TIMES DAILY (Patient taking differently: Take 600 mg by mouth in the morning, at noon, and at bedtime.) 180 capsule 2   irbesartan (AVAPRO) 150 MG tablet Take 1 tablet (150 mg total) by mouth daily. 90 tablet 3   loperamide (IMODIUM A-D) 2 MG tablet Take 2 mg by mouth 4 (four) times daily as needed for diarrhea or loose stools.     metoprolol succinate (TOPROL-XL) 25 MG 24 hr tablet TAKE 1 TABLET(25 MG) BY MOUTH DAILY 90 tablet 3   montelukast (SINGULAIR) 10 MG tablet Take 10 mg by mouth at bedtime.   0   Multiple Vitamins-Minerals (PRESERVISION AREDS 2) CAPS Take 1 capsule by mouth in the morning and at bedtime.     pantoprazole (PROTONIX) 40 MG tablet Take 1 tablet (40 mg total) by mouth 2 (two) times daily. (Patient taking differently: Take 40 mg by mouth See admin instructions. Take 40 mg by mouth before supper and once a day as needed for reflux) 60 tablet 3   rosuvastatin (CRESTOR) 5 MG tablet TAKE 1 TABLET(5 MG) BY MOUTH DAILY 90 tablet 3   SPIRIVA RESPIMAT 2.5 MCG/ACT AERS INHALE 2  PUFFS INTO THE LUNGS DAILY (Patient taking differently: Inhale 2 puffs into the lungs at bedtime.) 4 g 0   UNKNOWN TO PATIENT Take 1-2 tablets by mouth See admin instructions. [employer] brand otc sleep aid: Take 1-2 tablets by mouth as needed for sleep     UNKNOWN TO PATIENT Place 1 drop into both eyes See admin instructions. Unnamed OTC eye drops for dryness: Instill 1 drop into both eyes in the morning     VENTOLIN HFA 108 (90 BASE) MCG/ACT inhaler Inhale 2 puffs into the lungs every 6 (six) hours as needed for wheezing or shortness of breath.     No current facility-administered medications for this visit.     Review of Systems    She denies chest pain, palpitations, dyspnea, pnd, orthopnea, n, v, dizziness, syncope, edema, weight gain, or early satiety. All other systems reviewed and are otherwise negative except as noted above.   Physical Exam    VS:  BP 128/78 (BP Location: Left Arm, Patient Position: Sitting, Cuff Size: Normal)   Pulse 87   Ht 5\' 4"  (1.626 m)   Wt 119 lb (54 kg)   SpO2 93%   BMI 20.43 kg/m   GEN: Well nourished, well developed, in no acute distress. HEENT: normal. Neck: Supple, no JVD, carotid bruits, or masses. Cardiac: RRR with audible ectopy, no murmurs, rubs, or gallops. No clubbing, cyanosis, edema.  Radials/DP/PT 2+ and equal bilaterally.  Respiratory:  Respirations regular and unlabored, clear to auscultation bilaterally. GI: Soft, nontender, nondistended, BS + x 4. MS: no deformity or atrophy. Skin: warm and dry, no rash. Neuro:  Strength and sensation are intact. Psych: Normal affect.  Accessory Clinical Findings    ECG personally reviewed by me today - EKG Interpretation Date/Time:  Friday November 19 2022 14:59:51 EDT Ventricular Rate:  82 PR Interval:  150 QRS Duration:  84 QT Interval:  412 QTC Calculation: 481 R Axis:   27  Text Interpretation: Sinus rhythm Premature atrial complexes When compared with  ECG of 28-Jul-2022 17:35, PREVIOUS ECG  IS PRESENT Confirmed by Bernadene Person ([id number]) on 11/19/2022 4:31:11 PM  - no acute changes.   Lab Results  Component Value Date   WBC 5.5 07/31/2022   HGB 12.4 07/31/2022   HCT 37.2 07/31/2022   MCV 102.8 (H) 07/31/2022   PLT 208 07/31/2022   Lab Results  Component Value Date   CREATININE 0.55 (L) 09/17/2022   BUN 9 09/17/2022   NA 146 (H) 09/17/2022   K 3.8 09/17/2022   CL 102 09/17/2022   CO2 29 09/17/2022   Lab Results  Component Value Date   ALT 24 07/29/2022   AST 26 07/29/2022   ALKPHOS 43 07/29/2022   BILITOT 0.6 07/29/2022   Lab Results  Component Value Date   CHOL 93 06/08/2019   HDL 25 (L) 06/08/2019   LDLCALC 44 06/08/2019   TRIG 120 06/08/2019   CHOLHDL 3.7 06/08/2019    Lab Results  Component Value Date   HGBA1C  06/28/2010    5.6 (NOTE)                                                                       According to the ADA Clinical Practice Recommendations for 2011, when HbA1c is used as a screening test:   >=6.5%   Diagnostic of Diabetes Mellitus           (if abnormal result  is confirmed)  5.7-6.4%   Increased risk of developing Diabetes Mellitus  References:Diagnosis and Classification of Diabetes Mellitus,Diabetes Care,2011,34(Suppl 1):S62-S69 and Standards of Medical Care in         Diabetes - 2011,Diabetes Care,2011,34  (Suppl 1):S11-S61.    Assessment & Plan    1. Abnormal EKG/PACs/PVCs/NSVT: She has a documented history of NSVT, bigeminy.  Reviewed strips from endoscopy procedure as well as today's EKG with Dr. Royann Shivers, DOD.  Both procedural strips and today's EKG show sinus rhythm with PACs, presence of U waves. She is asymptomatic.  Will check BMET given presence of u waves, history of electrolyte abnormalities.  Continue to monitor for any associated symptoms.  Overall stable, no indication for further testing at this time.  2. Hypertension: BP well controlled. Continue current antihypertensive regimen.   3. Dilation of ascending aorta:  Echocardiogram in February 2021 showed EF 65 to 60%, G1 DD, normal LV function, moderate dilation of ascending aorta measuring 39 mm. Most recent CT of the chest in 01/2022 showed ascending thoracic aorta dilation, 4.4 cm in diameter.  She is pending repeat CT in 01/2023.  4. Hyperlipidemia: LDL was 83 in 04/2022.  Continue Crestor.  5. Tobacco use: Continues to smoke.  Full cessation advised.  6. Disposition: Follow-up in 6 months, sooner if needed.      Joylene Grapes, NP 11/19/2022, 4:37 PM

## 2022-11-24 ENCOUNTER — Other Ambulatory Visit (HOSPITAL_COMMUNITY): Payer: Self-pay

## 2022-11-25 ENCOUNTER — Other Ambulatory Visit (HOSPITAL_COMMUNITY): Payer: Self-pay

## 2022-11-25 ENCOUNTER — Other Ambulatory Visit: Payer: Self-pay

## 2022-11-25 MED ORDER — BUDESONIDE 3 MG PO CPEP
9.0000 mg | ORAL_CAPSULE | Freq: Every morning | ORAL | 12 refills | Status: AC
  Filled 2022-11-25: qty 90, 30d supply, fill #0
  Filled 2022-12-28: qty 90, 30d supply, fill #1
  Filled 2022-12-29: qty 90, 30d supply, fill #2

## 2022-11-26 ENCOUNTER — Other Ambulatory Visit (HOSPITAL_COMMUNITY): Payer: Self-pay

## 2022-12-07 DIAGNOSIS — H43813 Vitreous degeneration, bilateral: Secondary | ICD-10-CM | POA: Diagnosis not present

## 2022-12-07 DIAGNOSIS — H26493 Other secondary cataract, bilateral: Secondary | ICD-10-CM | POA: Diagnosis not present

## 2022-12-07 DIAGNOSIS — H353132 Nonexudative age-related macular degeneration, bilateral, intermediate dry stage: Secondary | ICD-10-CM | POA: Diagnosis not present

## 2022-12-20 DIAGNOSIS — K219 Gastro-esophageal reflux disease without esophagitis: Secondary | ICD-10-CM | POA: Diagnosis not present

## 2022-12-20 DIAGNOSIS — K52832 Lymphocytic colitis: Secondary | ICD-10-CM | POA: Diagnosis not present

## 2022-12-20 DIAGNOSIS — R152 Fecal urgency: Secondary | ICD-10-CM | POA: Diagnosis not present

## 2022-12-28 ENCOUNTER — Other Ambulatory Visit (HOSPITAL_COMMUNITY): Payer: Self-pay

## 2022-12-29 ENCOUNTER — Other Ambulatory Visit (HOSPITAL_COMMUNITY): Payer: Self-pay

## 2023-01-04 DIAGNOSIS — Z23 Encounter for immunization: Secondary | ICD-10-CM | POA: Diagnosis not present

## 2023-01-04 DIAGNOSIS — M810 Age-related osteoporosis without current pathological fracture: Secondary | ICD-10-CM | POA: Diagnosis not present

## 2023-01-17 DIAGNOSIS — R197 Diarrhea, unspecified: Secondary | ICD-10-CM | POA: Diagnosis not present

## 2023-01-28 ENCOUNTER — Ambulatory Visit: Payer: Medicare PPO | Admitting: Internal Medicine

## 2023-01-31 ENCOUNTER — Inpatient Hospital Stay: Payer: Medicare PPO | Attending: Internal Medicine

## 2023-01-31 ENCOUNTER — Ambulatory Visit (HOSPITAL_COMMUNITY)
Admission: RE | Admit: 2023-01-31 | Discharge: 2023-01-31 | Disposition: A | Discharge status: Home / Self Care | Payer: Medicare PPO | Source: Ambulatory Visit | Attending: Internal Medicine | Admitting: Internal Medicine

## 2023-01-31 DIAGNOSIS — I10 Essential (primary) hypertension: Secondary | ICD-10-CM | POA: Insufficient documentation

## 2023-01-31 DIAGNOSIS — J45909 Unspecified asthma, uncomplicated: Secondary | ICD-10-CM | POA: Insufficient documentation

## 2023-01-31 DIAGNOSIS — I77810 Thoracic aortic ectasia: Secondary | ICD-10-CM | POA: Diagnosis not present

## 2023-01-31 DIAGNOSIS — R197 Diarrhea, unspecified: Secondary | ICD-10-CM | POA: Insufficient documentation

## 2023-01-31 DIAGNOSIS — Z79899 Other long term (current) drug therapy: Secondary | ICD-10-CM | POA: Insufficient documentation

## 2023-01-31 DIAGNOSIS — Z902 Acquired absence of lung [part of]: Secondary | ICD-10-CM | POA: Insufficient documentation

## 2023-01-31 DIAGNOSIS — Z85118 Personal history of other malignant neoplasm of bronchus and lung: Secondary | ICD-10-CM | POA: Insufficient documentation

## 2023-01-31 DIAGNOSIS — Z7952 Long term (current) use of systemic steroids: Secondary | ICD-10-CM | POA: Insufficient documentation

## 2023-01-31 DIAGNOSIS — C3490 Malignant neoplasm of unspecified part of unspecified bronchus or lung: Secondary | ICD-10-CM | POA: Insufficient documentation

## 2023-01-31 DIAGNOSIS — Z87891 Personal history of nicotine dependence: Secondary | ICD-10-CM | POA: Insufficient documentation

## 2023-01-31 DIAGNOSIS — I719 Aortic aneurysm of unspecified site, without rupture: Secondary | ICD-10-CM | POA: Insufficient documentation

## 2023-01-31 DIAGNOSIS — J432 Centrilobular emphysema: Secondary | ICD-10-CM | POA: Diagnosis not present

## 2023-01-31 LAB — CBC WITH DIFFERENTIAL (CANCER CENTER ONLY)
Abs Immature Granulocytes: 0.05 10*3/uL (ref 0.00–0.07)
Basophils Absolute: 0 10*3/uL (ref 0.0–0.1)
Basophils Relative: 0 %
Eosinophils Absolute: 0.1 10*3/uL (ref 0.0–0.5)
Eosinophils Relative: 1 %
HCT: 47.8 % — ABNORMAL HIGH (ref 36.0–46.0)
Hemoglobin: 15.6 g/dL — ABNORMAL HIGH (ref 12.0–15.0)
Immature Granulocytes: 1 %
Lymphocytes Relative: 22 %
Lymphs Abs: 2.1 10*3/uL (ref 0.7–4.0)
MCH: 32.8 pg (ref 26.0–34.0)
MCHC: 32.6 g/dL (ref 30.0–36.0)
MCV: 100.4 fL — ABNORMAL HIGH (ref 80.0–100.0)
Monocytes Absolute: 0.7 10*3/uL (ref 0.1–1.0)
Monocytes Relative: 8 %
Neutro Abs: 6.5 10*3/uL (ref 1.7–7.7)
Neutrophils Relative %: 68 %
Platelet Count: 259 10*3/uL (ref 150–400)
RBC: 4.76 MIL/uL (ref 3.87–5.11)
RDW: 13.1 % (ref 11.5–15.5)
WBC Count: 9.6 10*3/uL (ref 4.0–10.5)
nRBC: 0 % (ref 0.0–0.2)

## 2023-01-31 LAB — CMP (CANCER CENTER ONLY)
ALT: 21 U/L (ref 0–44)
AST: 25 U/L (ref 15–41)
Albumin: 3.9 g/dL (ref 3.5–5.0)
Alkaline Phosphatase: 84 U/L (ref 38–126)
Anion gap: 13 (ref 5–15)
BUN: 5 mg/dL — ABNORMAL LOW (ref 8–23)
CO2: 30 mmol/L (ref 22–32)
Calcium: 8.9 mg/dL (ref 8.9–10.3)
Chloride: 96 mmol/L — ABNORMAL LOW (ref 98–111)
Creatinine: 0.46 mg/dL (ref 0.44–1.00)
GFR, Estimated: >60 mL/min (ref >60)
Glucose, Bld: 100 mg/dL — ABNORMAL HIGH (ref 70–99)
Potassium: 3.4 mmol/L — ABNORMAL LOW (ref 3.5–5.1)
Sodium: 139 mmol/L (ref 135–145)
Total Bilirubin: 0.6 mg/dL (ref 0.3–1.2)
Total Protein: 6.5 g/dL (ref 6.5–8.1)

## 2023-02-03 ENCOUNTER — Inpatient Hospital Stay: Payer: Medicare PPO | Admitting: Internal Medicine

## 2023-02-03 VITALS — BP 140/81 | HR 89 | Temp 97.7°F | Resp 15 | Ht 64.0 in | Wt 114.0 lb

## 2023-02-03 DIAGNOSIS — Z87891 Personal history of nicotine dependence: Secondary | ICD-10-CM | POA: Diagnosis not present

## 2023-02-03 DIAGNOSIS — Z902 Acquired absence of lung [part of]: Secondary | ICD-10-CM | POA: Diagnosis not present

## 2023-02-03 DIAGNOSIS — Z85118 Personal history of other malignant neoplasm of bronchus and lung: Secondary | ICD-10-CM | POA: Diagnosis not present

## 2023-02-03 DIAGNOSIS — C3490 Malignant neoplasm of unspecified part of unspecified bronchus or lung: Secondary | ICD-10-CM | POA: Diagnosis not present

## 2023-02-03 DIAGNOSIS — Z79899 Other long term (current) drug therapy: Secondary | ICD-10-CM | POA: Diagnosis not present

## 2023-02-03 DIAGNOSIS — I719 Aortic aneurysm of unspecified site, without rupture: Secondary | ICD-10-CM | POA: Diagnosis not present

## 2023-02-03 DIAGNOSIS — I10 Essential (primary) hypertension: Secondary | ICD-10-CM | POA: Diagnosis not present

## 2023-02-03 DIAGNOSIS — R197 Diarrhea, unspecified: Secondary | ICD-10-CM | POA: Diagnosis not present

## 2023-02-03 DIAGNOSIS — Z7952 Long term (current) use of systemic steroids: Secondary | ICD-10-CM | POA: Diagnosis not present

## 2023-02-03 DIAGNOSIS — J45909 Unspecified asthma, uncomplicated: Secondary | ICD-10-CM | POA: Diagnosis not present

## 2023-02-03 NOTE — Progress Notes (Signed)
[facility] Telephone:([phone]) [phone]   [phone]  OFFICE PROGRESS NOTE  Melanie Sanger, FNP [address] [place] [address]  DIAGNOSIS: Stage IA (T1b, N0, M0) non-small cell lung cancer, adenocarcinoma presented with right upper lobe nodule   PRIOR THERAPY: Status post right upper lobectomy with lymph node dissection under the care of Dr. Dorris Fetch on May 28, 2019.   CURRENT THERAPY: Observation  INTERVAL HISTORY: [name] 72 y.o. female returns to the clinic today for annual follow-up visit.Discussed the use of AI scribe software for clinical note transcription with the patient, who gave verbal consent to proceed.  History of Present Illness   [name], a 72 year old with a history of stage 1A non-small cell lung adenocarcinoma, underwent a right upper lobe resection in January 2021. Since then, she has been under observation with no new lung-related issues reported.  However, she has been dealing with chronic colitis, which has recently worsened. In April, she was hospitalized due to electrolyte imbalances, which were attributed to persistent diarrhea. She has been trialed on three different medications for colitis, with the current one being Zifaxan. Despite the medication, she continues to experience diarrhea, which has been severe enough to cause urgency and near accidents in the morning.  In addition to the colitis, she has also been dealing with high blood pressure, for which her cardiologist has made some medication adjustments. She also has a known aortic aneurysm, which is being monitored.  She reports that her asthma symptoms have worsened, but there is no mention of any changes in her lung cancer symptoms. She has not reported any new symptoms related to her lung condition.  Overall, the past six months have been challenging for her, with frequent doctor visits and hospitalizations. Despite these  challenges, she has been compliant with her follow-up appointments and medication regimen.        MEDICAL HISTORY: Past Medical History:  Diagnosis Date   Active smoker    per pt quit 01/ 2021 smoking prior to lung lobectomy 02/ 2021 but started back smoking 06/ 2022 stated 5 cig per day   Asthma    Chronic diarrhea    Complication of anesthesia    hard to wake and ponv   Dyslipidemia    Emphysema/COPD (HCC)    pulmologist--- dr Francine Graven;   no oxygen   Endometriosis    Family history of adverse reaction to anesthesia    brother--- slow to wake   Finger fracture, left    left ring and small proximal phalagel fx's   GAD (generalized anxiety disorder)    GERD (gastroesophageal reflux disease)    Hiatal hernia    History of cervical dysplasia    s/p gyn crysurgery yrs ago   History of gastric ulcer 05/2019   post lung lobectomy w/ esophagitis   History of TIA (transient ischemic attack)    12-22-2021  per pt remote TIA > 10 yrs ago,  no residual   Hypertension    followed by cardiologist and pcp;   cardiac cath 11-20-2002 completely normal;   last nuclear stress test 08-14-2010 low risk normal perfusion no ischemia, nuclear ef 59%   Intermittent palpitations    followed by dr hilty   Lymphocytic colitis    followed by [facility]   Macular degeneration of both eyes    Migraine    Non-small cell lung cancer, right [facility]) 05/2019   surgeon-- dr Dorris Fetch / oncologist--  dr Andi Layfield/  pulmologist-- dr j. Francine Graven;   05-28-2019  s/p  right upper lobectomy w/ node dissection's,  Stage IA,  no chemo or radiation   NSVT (nonsustained ventricular tachycardia) (HCC)    hx bigemy  and psot op wide complex tachycardia s/p lung lobectomy 02/ 2021   OSA (obstructive sleep apnea)    does not wear her CPAP because "it gave me respiratory issues (bronchities and pneumonia)"   Osteoporosis    PONV (postoperative nausea and vomiting)    Secondary polycythemia    evaluation by dr Duffy Rhody note in  epic 11-03-2015  per note due to tobacco use and respiratory issues   Thoracic ascending aortic aneurysm (HCC) 02/02/2022    ALLERGIES:  is allergic to iodinated contrast media, ioxaglate, omnipaque [iohexol], buprenorphine hcl, codeine, erythromycin, morphine and codeine, nicoderm [nicotine], and oyster extract.  MEDICATIONS:  Current Outpatient Medications  Medication Sig Dispense Refill   ALPRAZolam (XANAX) 0.5 MG tablet Take 0.5 mg by mouth 3 (three) times daily as needed for anxiety or sleep.     amLODipine (NORVASC) 2.5 MG tablet TAKE 1 TABLET BY MOUTH DAILY. (Patient taking differently: Take 2.5 mg by mouth in the morning and at bedtime.) 180 tablet 1   aspirin EC 81 MG tablet Take 81 mg by mouth every other day.     bismuth subsalicylate (PEPTO BISMOL) 262 MG chewable tablet Chew 524 mg by mouth as needed for diarrhea or loose stools.     budesonide (ENTOCORT EC) 3 MG 24 hr capsule Take 3 capsules (9 mg total) by mouth in the morning. 90 capsule 12   CALCIUM PO Take 1 tablet by mouth 2 (two) times a week.     cholestyramine (QUESTRAN) 4 GM/DOSE powder Take 4 g by mouth daily as needed (for diarrhea).     denosumab (PROLIA) 60 MG/ML SOLN injection Inject 60 mg into the skin every 6 (six) months. Administer in upper arm, thigh, or abdomen     ergocalciferol (VITAMIN D2) 1.25 MG (50000 UT) capsule Take 50,000 Units by mouth every Saturday.     eszopiclone 3 MG TABS Take 1 tablet (3 mg total) by mouth at bedtime as needed (for sleep- Take immediately before bedtime).  0   gabapentin (NEURONTIN) 300 MG capsule TAKE 2 CAPSULES(600 MG) BY MOUTH THREE TIMES DAILY (Patient taking differently: Take 600 mg by mouth in the morning, at noon, and at bedtime.) 180 capsule 2   irbesartan (AVAPRO) 150 MG tablet Take 1 tablet (150 mg total) by mouth daily. 90 tablet 3   loperamide (IMODIUM A-D) 2 MG tablet Take 2 mg by mouth 4 (four) times daily as needed for diarrhea or loose stools.     metoprolol  succinate (TOPROL-XL) 25 MG 24 hr tablet TAKE 1 TABLET(25 MG) BY MOUTH DAILY 90 tablet 3   montelukast (SINGULAIR) 10 MG tablet Take 10 mg by mouth at bedtime.   0   Multiple Vitamins-Minerals (PRESERVISION AREDS 2) CAPS Take 1 capsule by mouth in the morning and at bedtime.     pantoprazole (PROTONIX) 40 MG tablet Take 1 tablet (40 mg total) by mouth 2 (two) times daily. (Patient taking differently: Take 40 mg by mouth See admin instructions. Take 40 mg by mouth before supper and once a day as needed for reflux) 60 tablet 3   rosuvastatin (CRESTOR) 5 MG tablet TAKE 1 TABLET(5 MG) BY MOUTH DAILY 90 tablet 3   SPIRIVA RESPIMAT 2.5 MCG/ACT AERS INHALE 2 PUFFS INTO THE  LUNGS DAILY (Patient taking differently: Inhale 2 puffs into the lungs at bedtime.) 4 g 0   UNKNOWN TO PATIENT Take 1-2 tablets by mouth See admin instructions. [place] brand otc sleep aid: Take 1-2 tablets by mouth as needed for sleep     UNKNOWN TO PATIENT Place 1 drop into both eyes See admin instructions. Unnamed OTC eye drops for dryness: Instill 1 drop into both eyes in the morning     VENTOLIN HFA 108 (90 BASE) MCG/ACT inhaler Inhale 2 puffs into the lungs every 6 (six) hours as needed for wheezing or shortness of breath.     No current facility-administered medications for this visit.    SURGICAL HISTORY:  Past Surgical History:  Procedure Laterality Date   BIOPSY  06/05/2019   Procedure: BIOPSY;  Surgeon: Jeani Hawking, MD;  Location: [facility] ENDOSCOPY;  Service: Endoscopy;;   CARDIAC CATHETERIZATION  11/20/2002   @[facility]  by dr gamble;   completey normal;  patent coronary arteries   CATARACT EXTRACTION W/ INTRAOCULAR LENS IMPLANT Bilateral 08/2020   ESOPHAGOGASTRODUODENOSCOPY Left 06/05/2019   Procedure: ESOPHAGOGASTRODUODENOSCOPY (EGD);  Surgeon: Jeani Hawking, MD;  Location: [facility] ENDOSCOPY;  Service: Endoscopy;  Laterality: Left;   GYNECOLOGIC CRYOSURGERY     many yrs ago   INTERCOSTAL NERVE BLOCK Right 05/28/2019   Procedure:  Intercostal Nerve Block;  Surgeon: Loreli Slot, MD;  Location[facility];  Service: Thoracic;  Laterality: Right;   LAPAROSCOPIC CHOLECYSTECTOMY  10/15/2002   @[facility]    LUMBAR DISC SURGERY  08/15/2000   @[facility]  by dr Newell Coral;   left L3--4   (and also lumbar surgery approx. 1989)   NASAL SINUS SURGERY  2004   approx   NODE DISSECTION  05/28/2019   Procedure: Node Dissection;  Surgeon: Loreli Slot, MD;  Location: [facility] OR;  Service: Thoracic;;   OPEN REDUCTION INTERNAL FIXATION (ORIF) PROXIMAL PHALANX Left 12/29/2021   Procedure: Left ring and small finger proximal phalangeal closed reduction percutaneous pinning;  Surgeon: Gomez Cleverly, MD;  Location: [facility];  Service: Orthopedics;  Laterality: Left;   ROTATOR CUFF REPAIR Left    x2  last one  1990s   THORACOSCOPY  05/28/2019   THORASCOPY-WEDGE RESECTION AND  RIGHT  UPPER LOBECTOMY (Right)   THROAT SURGERY  1997   REMOVAL OF TUMOR  (PER PT BENIGN)   TONSILLECTOMY     age 60   VAGINAL HYSTERECTOMY  2000   w/  BILATERAL SALPINGOOPHORECTOMY    REVIEW OF SYSTEMS:  A comprehensive review of systems was negative except for: Constitutional: positive for fatigue Respiratory: positive for dyspnea on exertion Gastrointestinal: positive for diarrhea Neurological: positive for paresthesia   PHYSICAL EXAMINATION: General appearance: alert, cooperative, fatigued, and no distress Head: Normocephalic, without obvious abnormality, atraumatic Neck: no adenopathy, no JVD, supple, symmetrical, trachea midline, and thyroid not enlarged, symmetric, no tenderness/mass/nodules Lymph nodes: Cervical, supraclavicular, and axillary nodes normal. Resp: clear to auscultation bilaterally Back: symmetric, no curvature. ROM normal. No CVA tenderness. Cardio: regular rate and rhythm, S1, S2 normal, no murmur, click, rub or gallop GI: soft, non-tender; bowel sounds normal; no masses,  no organomegaly Extremities: extremities normal,  atraumatic, no cyanosis or edema  ECOG PERFORMANCE STATUS: 1 - Symptomatic but completely ambulatory  Blood pressure (!) 149/80, pulse 89, temperature 97.7 F (36.5 C), temperature source Oral, resp. rate 15, height 5\' 4"  (1.626 m), weight 114 lb (51.7 kg), SpO2 96%.  LABORATORY DATA: Lab Results  Component Value Date   WBC 9.6 01/31/2023  HGB 15.6 (H) 01/31/2023   HCT 47.8 (H) 01/31/2023   MCV 100.4 (H) 01/31/2023   PLT 259 01/31/2023      Chemistry      Component Value Date/Time   NA 139 01/31/2023 1352   NA 146 (H) 09/17/2022 1506   K 3.4 (L) 01/31/2023 1352   CL 96 (L) 01/31/2023 1352   CO2 30 01/31/2023 1352   BUN 5 (L) 01/31/2023 1352   BUN 9 09/17/2022 1506   CREATININE 0.46 01/31/2023 1352      Component Value Date/Time   CALCIUM 8.9 01/31/2023 1352   ALKPHOS 84 01/31/2023 1352   AST 25 01/31/2023 1352   ALT 21 01/31/2023 1352   BILITOT 0.6 01/31/2023 1352       RADIOGRAPHIC STUDIES: CT Chest Wo Contrast  Result Date: 02/02/2023 CLINICAL DATA:  Non-small cell lung cancer staging; * Tracking Code: BO * EXAM: CT CHEST WITHOUT CONTRAST TECHNIQUE: Multidetector CT imaging of the chest was performed following the standard protocol without IV contrast. RADIATION DOSE REDUCTION: This exam was performed according to the departmental dose-optimization program which includes automated exposure control, adjustment of the mA and/or kV according to patient size and/or use of iterative reconstruction technique. COMPARISON:  Multiple priors, most recent chest CT dated January 25, 2022 FINDINGS: Cardiovascular: Normal heart size. No pericardial effusion. Dilated ascending thoracic aorta, measuring up to 4.4 cm. Severe calcified plaque of the thoracic aorta. Severe coronary artery calcifications. Mediastinum/Nodes: Esophagus thyroid are unremarkable. No enlarged lymph nodes seen in the chest. Lungs/Pleura: Stable postsurgical findings of right upper lobectomy. Severe centrilobular  emphysema. No new or enlarging pulmonary nodules. No pleural effusion. Upper Abdomen: Prior cholecystectomy. Unchanged dilation of the common bile duct. Partially visualized simple appearing cyst of the left kidney. Musculoskeletal: No chest wall mass or suspicious bone lesions identified. IMPRESSION: 1. Stable postsurgical findings of right upper lobectomy. No evidence of recurrent or metastatic disease in the chest. 2. Dilated ascending thoracic aorta, measuring up to 4.4 cm, unchanged when compared with the prior exam. Recommend annual imaging followup by CTA or MRA. This recommendation follows 2010 ACCF/AHA/AATS/ACR/ASA/SCA/SCAI/SIR/STS/SVM Guidelines for the Diagnosis and Management of Patients with Thoracic Aortic Disease. Circulation. 2010; 121: Z610-R604. Aortic aneurysm NOS (ICD10-I71.9) 3. Coronary artery calcifications, aortic Atherosclerosis (ICD10-I70.0) and Emphysema (ICD10-J43.9). Electronically Signed   By: Allegra Lai [name]   On: 02/02/2023 12:49     ASSESSMENT AND PLAN: This is a very pleasant 72 years old white female with a stage IA (T1b, N0, M0) non-small cell lung cancer, adenocarcinoma presented with right upper lobe lung nodule status post right lower lobectomy with lymph node dissection under the care of Dr. Dorris Fetch.   The patient has been on observation since 2021 and she is feeling fine with no concerning complaints.    Stage 1A Non-Small Cell Lung Cancer (Adenocarcinoma) Status post right upper lobe resection in January 2021. Recent CT scan shows no evidence of growth or spread. -Continue annual surveillance until completion of 5 years post-resection.  Chronic Diarrhea Persistent despite multiple medication trials, currently on Zifaxan. Associated with electrolyte imbalance requiring hospitalization. -Continue current management under Gastroenterologist Dr. Elnoria Howard.  Hypertension Recent adjustments in medication due to elevated blood pressure. -Continue current  management under Cardiologist.  Saccular Aortic Aneurysm Stable, being monitored via imaging. -Follow-up with Dr. Dorris Fetch next month.  Asthma Patient reports worsening symptoms. -Consider evaluation and management adjustment if necessary.   The patient was advised to call immediately if she has any other concerning symptoms in  the interval. The patient voices understanding of current disease status and treatment options and is in agreement with the current care plan.  All questions were answered. The patient knows to call the clinic with any problems, questions or concerns. We can certainly see the patient much sooner if necessary.  Disclaimer: This note was dictated with voice recognition software. Similar sounding words can inadvertently be transcribed and may not be corrected upon review.

## 2023-02-05 DIAGNOSIS — H353132 Nonexudative age-related macular degeneration, bilateral, intermediate dry stage: Secondary | ICD-10-CM | POA: Diagnosis not present

## 2023-02-07 DIAGNOSIS — Z1231 Encounter for screening mammogram for malignant neoplasm of breast: Secondary | ICD-10-CM | POA: Diagnosis not present

## 2023-02-08 ENCOUNTER — Ambulatory Visit: Payer: Medicare PPO | Admitting: Thoracic Surgery (Cardiothoracic Vascular Surgery)

## 2023-03-02 DIAGNOSIS — R197 Diarrhea, unspecified: Secondary | ICD-10-CM | POA: Diagnosis not present

## 2023-03-07 DIAGNOSIS — H353132 Nonexudative age-related macular degeneration, bilateral, intermediate dry stage: Secondary | ICD-10-CM | POA: Diagnosis not present

## 2023-03-08 ENCOUNTER — Ambulatory Visit: Payer: Medicare PPO | Admitting: Thoracic Surgery (Cardiothoracic Vascular Surgery)

## 2023-03-08 VITALS — BP 138/83 | HR 94 | Resp 18 | Ht 64.0 in | Wt 114.0 lb

## 2023-03-08 DIAGNOSIS — I7121 Aneurysm of the ascending aorta, without rupture: Secondary | ICD-10-CM | POA: Diagnosis not present

## 2023-03-08 DIAGNOSIS — C3491 Malignant neoplasm of unspecified part of right bronchus or lung: Secondary | ICD-10-CM

## 2023-03-08 NOTE — Progress Notes (Signed)
[address]       [name] [address]             [phone]     HPI: Ms. [name] returns for follow-up of her ascending aneurysm and lung cancer.  [name] is a 72 year old woman with a history of tobacco abuse, COPD, ascending aneurysm, hypertension, hyperlipidemia, endometriosis, migraines, anxiety, thoracic aortic atherosclerosis, coronary atherosclerosis, and stage Ia adenocarcinoma of the lung.  She had a robotic right upper lobectomy for stage Ia adenocarcinoma in February 2021.  She had a complicated postoperative course with a air leaks, esophagitis, gastric ulcer, and wide-complex tachycardia.  Around this time she was found to have an ascending aneurysm on her CT scans.  She has been followed by Dr. Arbutus Ped.  She did not require adjuvant therapy.  Continues to smoke.  Currently smoking about 5 cigarettes a day.  Short of breath with activity.   Past Medical History:  Diagnosis Date   Active smoker    per pt quit 01/ 2021 smoking prior to lung lobectomy 02/ 2021 but started back smoking 06/ 2022 stated 5 cig per day   Asthma    Chronic diarrhea    Complication of anesthesia    hard to wake and ponv   Dyslipidemia    Emphysema/COPD (HCC)    pulmologist--- dr Francine Graven;   no oxygen   Endometriosis    Family history of adverse reaction to anesthesia    brother--- slow to wake   Finger fracture, left    left ring and small proximal phalagel fx's   GAD (generalized anxiety disorder)    GERD (gastroesophageal reflux disease)    Hiatal hernia    History of cervical dysplasia    s/p gyn crysurgery yrs ago   History of gastric ulcer 05/2019   post lung lobectomy w/ esophagitis   History of TIA (transient ischemic attack)    12-22-2021  per pt remote TIA > 10 yrs ago,  no residual   Hypertension    followed by cardiologist and pcp;   cardiac cath 11-20-2002 completely normal;   last nuclear stress test 08-14-2010 low risk normal perfusion no  ischemia, nuclear ef 59%   Intermittent palpitations    followed by dr hilty   Lymphocytic colitis    followed by [facility]   Macular degeneration of both eyes    Migraine    Non-small cell lung cancer, right (HCC) 05/2019   surgeon-- dr Dorris Fetch / oncologist-- dr Arbutus Ped  pulmologist-- dr j. Francine Graven;   05-28-2019  s/p  right upper lobectomy w/ node dissection's,  Stage IA,  no chemo or radiation   NSVT (nonsustained ventricular tachycardia) (HCC)    hx bigemy  and psot op wide complex tachycardia s/p lung lobectomy 02/ 2021   OSA (obstructive sleep apnea)    does not wear her CPAP because "it gave me respiratory issues (bronchities and pneumonia)"   Osteoporosis    PONV (postoperative nausea and vomiting)    Secondary polycythemia    evaluation by dr Duffy Rhody note in epic 11-03-2015  per note due to tobacco use and respiratory issues   Thoracic ascending aortic aneurysm (HCC) 02/02/2022    Current Outpatient Medications  Medication Sig Dispense Refill   ALPRAZolam (XANAX) 0.5 MG tablet Take 0.5 mg by mouth 3 (three) times daily as needed for anxiety or sleep.     amLODipine (NORVASC) 2.5 MG tablet TAKE 1 TABLET BY MOUTH DAILY. (Patient taking  differently: Take 2.5 mg by mouth in the morning and at bedtime.) 180 tablet 1   aspirin EC 81 MG tablet Take 81 mg by mouth every other day.     bismuth subsalicylate (PEPTO BISMOL) 262 MG chewable tablet Chew 524 mg by mouth as needed for diarrhea or loose stools.     budesonide (ENTOCORT EC) 3 MG 24 hr capsule Take 3 capsules (9 mg total) by mouth in the morning. 90 capsule 12   CALCIUM PO Take 1 tablet by mouth 2 (two) times a week.     colestipol (COLESTID) 1 g tablet 2 tab(s) orally 2 times a day for 30 days     denosumab (PROLIA) 60 MG/ML SOLN injection Inject 60 mg into the skin every 6 (six) months. Administer in upper arm, thigh, or abdomen     ergocalciferol (VITAMIN D2) 1.25 MG (50000 UT) capsule Take 50,000 Units by mouth every  Saturday.     eszopiclone 3 MG TABS Take 1 tablet (3 mg total) by mouth at bedtime as needed (for sleep- Take immediately before bedtime).  0   gabapentin (NEURONTIN) 300 MG capsule TAKE 2 CAPSULES(600 MG) BY MOUTH THREE TIMES DAILY (Patient taking differently: Take 600 mg by mouth in the morning, at noon, and at bedtime.) 180 capsule 2   irbesartan (AVAPRO) 150 MG tablet Take 1 tablet (150 mg total) by mouth daily. 90 tablet 3   loperamide (IMODIUM A-D) 2 MG tablet Take 2 mg by mouth 4 (four) times daily as needed for diarrhea or loose stools.     metoprolol succinate (TOPROL-XL) 25 MG 24 hr tablet TAKE 1 TABLET(25 MG) BY MOUTH DAILY 90 tablet 3   montelukast (SINGULAIR) 10 MG tablet Take 10 mg by mouth at bedtime.   0   Multiple Vitamins-Minerals (PRESERVISION AREDS 2) CAPS Take 1 capsule by mouth in the morning and at bedtime.     pantoprazole (PROTONIX) 40 MG tablet Take 1 tablet (40 mg total) by mouth 2 (two) times daily. (Patient taking differently: Take 40 mg by mouth See admin instructions. Take 40 mg by mouth before supper and once a day as needed for reflux) 60 tablet 3   rosuvastatin (CRESTOR) 5 MG tablet TAKE 1 TABLET(5 MG) BY MOUTH DAILY 90 tablet 3   SPIRIVA RESPIMAT 2.5 MCG/ACT AERS INHALE 2 PUFFS INTO THE LUNGS DAILY (Patient taking differently: Inhale 2 puffs into the lungs at bedtime.) 4 g 0   UNKNOWN TO PATIENT Take 1-2 tablets by mouth See admin instructions. [place] brand otc sleep aid: Take 1-2 tablets by mouth as needed for sleep     UNKNOWN TO PATIENT Place 1 drop into both eyes See admin instructions. Unnamed OTC eye drops for dryness: Instill 1 drop into both eyes in the morning     VENTOLIN HFA 108 (90 BASE) MCG/ACT inhaler Inhale 2 puffs into the lungs every 6 (six) hours as needed for wheezing or shortness of breath.     XIFAXAN 550 MG TABS tablet Take 550 mg by mouth 3 (three) times daily.     cholestyramine (QUESTRAN) 4 GM/DOSE powder Take 4 g by mouth daily as needed  (for diarrhea). (Patient not taking: Reported on 03/08/2023)     No current facility-administered medications for this visit.    Physical Exam BP 138/83 (BP Location: Right Arm, Patient Position: Sitting)   Pulse 94   Resp 18   Ht 5\' 4"  (1.626 m)   Wt 114 lb (51.7 kg)   SpO2  90% Comment: RA  BMI 19.36 kg/m  71 year old woman in no acute distress Alert and oriented x 3 with no focal deficits Lungs diminished at right base but otherwise clear No cervical or supraclavicular adenopathy Cardiac regular rate and rhythm  Diagnostic Tests: CT CHEST WITHOUT CONTRAST   TECHNIQUE: Multidetector CT imaging of the chest was performed following the standard protocol without IV contrast.   RADIATION DOSE REDUCTION: This exam was performed according to the departmental dose-optimization program which includes automated exposure control, adjustment of the mA and/or kV according to patient size and/or use of iterative reconstruction technique.   COMPARISON:  Multiple priors, most recent chest CT dated January 25, 2022   FINDINGS: Cardiovascular: Normal heart size. No pericardial effusion. Dilated ascending thoracic aorta, measuring up to 4.4 cm. Severe calcified plaque of the thoracic aorta. Severe coronary artery calcifications.   Mediastinum/Nodes: Esophagus thyroid are unremarkable. No enlarged lymph nodes seen in the chest.   Lungs/Pleura: Stable postsurgical findings of right upper lobectomy. Severe centrilobular emphysema. No new or enlarging pulmonary nodules. No pleural effusion.   Upper Abdomen: Prior cholecystectomy. Unchanged dilation of the common bile duct. Partially visualized simple appearing cyst of the left kidney.   Musculoskeletal: No chest wall mass or suspicious bone lesions identified.   IMPRESSION: 1. Stable postsurgical findings of right upper lobectomy. No evidence of recurrent or metastatic disease in the chest. 2. Dilated ascending thoracic aorta,  measuring up to 4.4 cm, unchanged when compared with the prior exam. Recommend annual imaging followup by CTA or MRA. This recommendation follows 2010 ACCF/AHA/AATS/ACR/ASA/SCA/SCAI/SIR/STS/SVM Guidelines for the Diagnosis and Management of Patients with Thoracic Aortic Disease. Circulation. 2010; 121: W098-J191. Aortic aneurysm NOS (ICD10-I71.9) 3. Coronary artery calcifications, aortic Atherosclerosis (ICD10-I70.0) and Emphysema (ICD10-J43.9).     Electronically Signed   By: Allegra Lai [name]   On: 02/02/2023 12:49 I personally reviewed the CT images.  No change in the 4.3 cm ascending aneurysm.  Status post right upper lobectomy.  No evidence of recurrent disease.  Emphysema.  Severe thoracic aortic atherosclerosis.  Three-vessel coronary disease.  Impression: [name] is a 72 year old woman with a history of tobacco abuse, COPD, ascending aneurysm, hypertension, hyperlipidemia, endometriosis, migraines, anxiety, thoracic aortic atherosclerosis, coronary atherosclerosis, and stage Ia adenocarcinoma of the lung.  Stage Ia adenocarcinoma of the lung-now 3-1/2 years out from surgery.  No evidence of recurrent disease.  Continues with follow-up.  Tobacco abuse-unfortunately continues to smoke.  Ascending aneurysm-stable 4.3 cm.  Has severe thoracic aortic atherosclerosis with calcification and porcelain aorta.  Coronary calcification noted on CT-no anginal symptoms.  Plan: Follow-up in 1 year after CT of chest  Loreli Slot, MD [facility] [phone]

## 2023-03-10 DIAGNOSIS — Z961 Presence of intraocular lens: Secondary | ICD-10-CM | POA: Diagnosis not present

## 2023-03-10 DIAGNOSIS — H18413 Arcus senilis, bilateral: Secondary | ICD-10-CM | POA: Diagnosis not present

## 2023-03-10 DIAGNOSIS — H353131 Nonexudative age-related macular degeneration, bilateral, early dry stage: Secondary | ICD-10-CM | POA: Diagnosis not present

## 2023-03-10 DIAGNOSIS — H26493 Other secondary cataract, bilateral: Secondary | ICD-10-CM | POA: Diagnosis not present

## 2023-03-10 DIAGNOSIS — H26492 Other secondary cataract, left eye: Secondary | ICD-10-CM | POA: Diagnosis not present

## 2023-03-15 NOTE — Progress Notes (Unsigned)
Office Visit    Patient Name: [name] Date of Encounter: 03/17/2023  Primary Care Provider:  Fatima Sanger, FNP Primary Cardiologist:  Chrystie Nose, MD  Chief Complaint    72 year old female with a history of ascending aortic dilation, abnormal EKG, NSVT, hypertension, hyperlipidemia, TIA, lung cancer s/p right upper lobectomy, and tobacco use who presents for follow-up related to hypertension.   Past Medical History    Past Medical History:  Diagnosis Date   Active smoker    per pt quit 01/ 2021 smoking prior to lung lobectomy 02/ 2021 but started back smoking 06/ 2022 stated 5 cig per day   Asthma    Chronic diarrhea    Complication of anesthesia    hard to wake and ponv   Dyslipidemia    Emphysema/COPD (HCC)    pulmologist--- dr Francine Graven;   no oxygen   Endometriosis    Family history of adverse reaction to anesthesia    brother--- slow to wake   Finger fracture, left    left ring and small proximal phalagel fx's   GAD (generalized anxiety disorder)    GERD (gastroesophageal reflux disease)    Hiatal hernia    History of cervical dysplasia    s/p gyn crysurgery yrs ago   History of gastric ulcer 05/2019   post lung lobectomy w/ esophagitis   History of TIA (transient ischemic attack)    12-22-2021  per pt remote TIA > 10 yrs ago,  no residual   Hypertension    followed by cardiologist and pcp;   cardiac cath 11-20-2002 completely normal;   last nuclear stress test 08-14-2010 low risk normal perfusion no ischemia, nuclear ef 59%   Intermittent palpitations    followed by dr hilty   Lymphocytic colitis    followed by [facility]   Macular degeneration of both eyes    Migraine    Non-small cell lung cancer, right (HCC) 05/2019   surgeon-- dr Dorris Fetch / oncologist-- dr Arbutus Ped  pulmologist-- dr j. Francine Graven;   05-28-2019  s/p  right upper lobectomy w/ node dissection's,  Stage IA,  no chemo or radiation   NSVT (nonsustained ventricular tachycardia)  (HCC)    hx bigemy  and psot op wide complex tachycardia s/p lung lobectomy 02/ 2021   OSA (obstructive sleep apnea)    does not wear her CPAP because "it gave me respiratory issues (bronchities and pneumonia)"   Osteoporosis    PONV (postoperative nausea and vomiting)    Secondary polycythemia    evaluation by dr Duffy Rhody note in epic 11-03-2015  per note due to tobacco use and respiratory issues   Thoracic ascending aortic aneurysm (HCC) 02/02/2022   Past Surgical History:  Procedure Laterality Date   BIOPSY  06/05/2019   Procedure: BIOPSY;  Surgeon: Jeani Hawking, MD;  Location: [facility] ENDOSCOPY;  Service: Endoscopy;;   CARDIAC CATHETERIZATION  11/20/2002   @[facility]  by dr gamble;   completey normal;  patent coronary arteries   CATARACT EXTRACTION W/ INTRAOCULAR LENS IMPLANT Bilateral 08/2020   ESOPHAGOGASTRODUODENOSCOPY Left 06/05/2019   Procedure: ESOPHAGOGASTRODUODENOSCOPY (EGD);  Surgeon: Jeani Hawking, MD;  Location: [facility] ENDOSCOPY;  Service: Endoscopy;  Laterality: Left;   GYNECOLOGIC CRYOSURGERY     many yrs ago   INTERCOSTAL NERVE BLOCK Right 05/28/2019   Procedure: Intercostal Nerve Block;  Surgeon: Loreli Slot, MD;  Location: [facility] OR;  Service: Thoracic;  Laterality: Right;   LAPAROSCOPIC CHOLECYSTECTOMY  10/15/2002   @[facility]    LUMBAR  DISC SURGERY  08/15/2000   @[facility]  by dr Newell Coral;   left L3--4   (and also lumbar surgery approx. 1989)   NASAL SINUS SURGERY  2004   approx   NODE DISSECTION  05/28/2019   Procedure: Node Dissection;  Surgeon: Loreli Slot, MD;  Location: [facility] OR;  Service: Thoracic;;   OPEN REDUCTION INTERNAL FIXATION (ORIF) PROXIMAL PHALANX Left 12/29/2021   Procedure: Left ring and small finger proximal phalangeal closed reduction percutaneous pinning;  Surgeon: Gomez Cleverly, MD;  Location: [facility];  Service: Orthopedics;  Laterality: Left;   ROTATOR CUFF REPAIR Left    x2  last one  1990s   THORACOSCOPY  05/28/2019   THORASCOPY-WEDGE  RESECTION AND  RIGHT  UPPER LOBECTOMY (Right)   THROAT SURGERY  1997   REMOVAL OF TUMOR  (PER PT BENIGN)   TONSILLECTOMY     age 11   VAGINAL HYSTERECTOMY  2000   w/  BILATERAL SALPINGOOPHORECTOMY    Allergies  Allergies  Allergen Reactions   Iodinated Contrast Media Itching and Other (See Comments)    itching but no hives just after iv contrast injection w/ 13 hr prep, future contrast not advised unless absolutely necessary, 13 hr prep would still be advised.//a.[email or web address]   Ioxaglate Itching and Other (See Comments)    itching but no hives just after iv contrast injection w/ 13 hr prep, future contrast not advised unless absolutely necessary, 13 hr prep would still be advised.//a.calhoun   Omnipaque [Iohexol] Anaphylaxis   Buprenorphine Hcl Nausea And Vomiting   Codeine Nausea And Vomiting   Erythromycin Other (See Comments)    Hospitalized for 5 days after receiving too much   Morphine And Codeine Nausea And Vomiting   Nicoderm [Nicotine] Itching and Other (See Comments)    Patches cause itching   Oyster Extract Nausea And Vomiting and Other (See Comments)    "I went into shock"     Labs/Other Studies Reviewed    The following studies were reviewed today:  Cardiac Studies & Procedures     STRESS TESTS  MYOCARDIAL PERFUSION IMAGING 08/14/2019  Narrative  The left ventricular ejection fraction is normal (55-65%).  Nuclear stress EF: 59%.  There was no ST segment deviation noted during stress.  The study is normal.  This is a low risk study.  No change from prior study.   ECHOCARDIOGRAM  ECHOCARDIOGRAM COMPLETE 06/07/2019  Narrative ECHOCARDIOGRAM REPORT    Patient Name:   [name] Date of Exam: 06/07/2019 Medical Rec #:  [id number]       Height:       64.0 in Accession #:    [id number]      Weight:       108.0 lb Date of Birth:  1951-02-20       BSA:          1.51 m Patient Age:    68 years        BP:           119/70 mmHg Patient Gender: F                HR:           71 bpm. Exam Location:  Inpatient  Procedure: 2D Echo, Cardiac Doppler and Color Doppler  Indications:    R00.0 Tachycardia  History:        Patient has no prior history of Echocardiogram examinations. Risk Factors:Hypertension, Dyslipidemia, Current Smoker and Sleep Apnea.  Sonographer:  Tiffany Dance Referring Phys: [id number] CADENCE H FURTH  IMPRESSIONS   1. Left ventricular ejection fraction, by estimation, is 55 to 60%. The left ventricle has normal function. The left ventrical has no regional wall motion abnormalities. Left ventricular diastolic parameters are consistent with Grade I diastolic dysfunction (impaired relaxation). 2. Right ventricular systolic function is normal. The right ventricular size is normal. 3. The mitral valve is normal in structure and function. no evidence of mitral valve regurgitation. No evidence of mitral stenosis. 4. The aortic valve is normal in structure and function. Aortic valve regurgitation is not visualized. No aortic stenosis is present. 5. Aortic dilatation noted. There is moderate dilatation of the ascending aorta measuring 39 mm.  FINDINGS Left Ventricle: Left ventricular ejection fraction, by estimation, is 55 to 60%. The left ventricle has normal function. The left ventricle has no regional wall motion abnormalities. There is no left ventricular hypertrophy. Left ventricular diastolic parameters are consistent with Grade I diastolic dysfunction (impaired relaxation).  Right Ventricle: The right ventricular size is normal. No increase in right ventricular wall thickness. Right ventricular systolic function is normal.  Left Atrium: Left atrial size was normal in size.  Right Atrium: Right atrial size was normal in size.  Pericardium: There is no evidence of pericardial effusion.  Mitral Valve: The mitral valve is normal in structure and function. No evidence of mitral valve regurgitation. No evidence of mitral  valve stenosis.  Tricuspid Valve: The tricuspid valve is grossly normal. Tricuspid valve regurgitation is trivial.  Aortic Valve: The aortic valve is normal in structure and function. Aortic valve regurgitation is not visualized. No aortic stenosis is present.  Pulmonic Valve: The pulmonic valve was grossly normal. Pulmonic valve regurgitation is trivial.  Aorta: The aortic root, ascending aorta and aortic arch are all structurally normal, with no evidence of dilitation or obstruction and aortic dilatation noted. There is moderate dilatation of the ascending aorta measuring 39 mm.  IAS/Shunts: The atrial septum is grossly normal.   LEFT VENTRICLE PLAX 2D LVIDd:         4.00 cm  Diastology LVIDs:         3.50 cm  LV e' lateral:   7.51 cm/s LV PW:         0.80 cm  LV E/e' lateral: 8.0 LV IVS:        0.90 cm  LV e' medial:    4.68 cm/s LVOT diam:     2.10 cm  LV E/e' medial:  12.8 LV SV:         58.88 ml LV SV Index:   12.92 LVOT Area:     3.46 cm   RIGHT VENTRICLE             IVC RV Basal diam:  2.40 cm     IVC diam: 1.10 cm RV S prime:     14.10 cm/s TAPSE (M-mode): 1.8 cm  LEFT ATRIUM             Index       RIGHT ATRIUM          Index LA diam:        2.20 cm 1.46 cm/m  RA Area:     9.27 cm LA Vol (A2C):   16.2 ml 10.76 ml/m RA Volume:   16.00 ml 10.63 ml/m LA Vol (A4C):   22.8 ml 15.14 ml/m LA Biplane Vol: 19.3 ml 12.82 ml/m AORTIC VALVE LVOT Vmax:   82.50 cm/s LVOT Vmean:  46.900  cm/s LVOT VTI:    0.170 m  AORTA Ao Root diam: 3.30 cm Ao Asc diam:  3.90 cm  MITRAL VALVE MV Area (PHT): 2.69 cm             SHUNTS MV Decel Time: 282 msec             Systemic VTI:  0.17 m MV E velocity: 60.00 cm/s 103 cm/s  Systemic Diam: 2.10 cm MV A velocity: 93.40 cm/s 70.3 cm/s MV E/A ratio:  0.64       1.5  Kristeen Miss MD Electronically signed by Kristeen Miss MD Signature Date/Time: 06/07/2019/4:57:56 PM    Final            Recent Labs: 07/31/2022: Magnesium  1.6 01/31/2023: ALT 21; BUN 5; Creatinine 0.46; Hemoglobin 15.6; Platelet Count 259; Potassium 3.4; Sodium 139  Recent Lipid Panel    Component Value Date/Time   CHOL 93 06/08/2019 0116   TRIG 120 06/08/2019 0116   HDL 25 (L) 06/08/2019 0116   CHOLHDL 3.7 06/08/2019 0116   VLDL 24 06/08/2019 0116   LDLCALC 44 06/08/2019 0116    History of Present Illness    72 year old female with the above past medical history including ascending aorta dilaiton, NSVT, hypertension, hyperlipidemia, TIA, lung cancer s/p right upper lobectomy, and tobacco use.   Echocardiogram in February 2021 showed EF 65 to 60%, G1 DD, normal LV function, moderate dilation of ascending aorta measuring 39 mm.  Most recent CT of the chest in 01/2022 showed ascending thoracic aorta dilation, 4.4 cm in diameter.  She was hospitalized in April 2024 in the setting of dizziness, electrolyte abnormalities.  Several of her antihypertensive medications were discontinued at that time. She called our office on 11/04/2022 with concern for have normal heart rhythm while under anesthesia. She was last seen in the office on 11/19/2022 and was stable from a cardiac standpoint.    She presents today for follow-up.  Since her last visit she has been stable from a cardiac standpoint.  Her BP has been moderately low at times.  She has noted some intermittent lightheadedness, generalized fatigue.  She has stable chronic dyspnea, unchanged from prior visits, she denies any chest pain, palpitations, presyncope, syncope, edema, PND, orthopnea, weight gain.    Home Medications    Current Outpatient Medications  Medication Sig Dispense Refill   ALPRAZolam (XANAX) 0.5 MG tablet Take 0.5 mg by mouth 3 (three) times daily as needed for anxiety or sleep.     amLODipine (NORVASC) 2.5 MG tablet TAKE 1 TABLET BY MOUTH DAILY. (Patient taking differently: Take 2.5 mg by mouth in the morning and at bedtime.) 180 tablet 1   aspirin EC 81 MG tablet Take 81 mg by  mouth every other day.     bismuth subsalicylate (PEPTO BISMOL) 262 MG chewable tablet Chew 524 mg by mouth as needed for diarrhea or loose stools.     CALCIUM PO Take 1 tablet by mouth 2 (two) times a week.     cholestyramine (QUESTRAN) 4 GM/DOSE powder Take 4 g by mouth daily as needed (for diarrhea).     denosumab (PROLIA) 60 MG/ML SOLN injection Inject 60 mg into the skin every 6 (six) months. Administer in upper arm, thigh, or abdomen     ergocalciferol (VITAMIN D2) 1.25 MG (50000 UT) capsule Take 50,000 Units by mouth every Saturday.     eszopiclone 3 MG TABS Take 1 tablet (3 mg total) by mouth at bedtime as needed (  for sleep- Take immediately before bedtime).  0   gabapentin (NEURONTIN) 300 MG capsule TAKE 2 CAPSULES(600 MG) BY MOUTH THREE TIMES DAILY (Patient taking differently: Take 600 mg by mouth in the morning, at noon, and at bedtime.) 180 capsule 2   irbesartan (AVAPRO) 150 MG tablet Take 1 tablet (150 mg total) by mouth daily. 90 tablet 3   loperamide (IMODIUM A-D) 2 MG tablet Take 2 mg by mouth 4 (four) times daily as needed for diarrhea or loose stools.     metoprolol succinate (TOPROL-XL) 25 MG 24 hr tablet TAKE 1 TABLET(25 MG) BY MOUTH DAILY 90 tablet 3   montelukast (SINGULAIR) 10 MG tablet Take 10 mg by mouth at bedtime.   0   Multiple Vitamins-Minerals (PRESERVISION AREDS 2) CAPS Take 1 capsule by mouth in the morning and at bedtime.     pantoprazole (PROTONIX) 40 MG tablet Take 1 tablet (40 mg total) by mouth 2 (two) times daily. (Patient taking differently: Take 40 mg by mouth See admin instructions. Take 40 mg by mouth before supper and once a day as needed for reflux) 60 tablet 3   rosuvastatin (CRESTOR) 10 MG tablet Take 1 tablet (10 mg total) by mouth daily. 90 tablet 3   SPIRIVA RESPIMAT 2.5 MCG/ACT AERS INHALE 2 PUFFS INTO THE LUNGS DAILY (Patient taking differently: Inhale 2 puffs into the lungs at bedtime.) 4 g 0   UNKNOWN TO PATIENT Take 1-2 tablets by mouth See admin  instructions. [place] brand otc sleep aid: Take 1-2 tablets by mouth as needed for sleep     UNKNOWN TO PATIENT Place 1 drop into both eyes See admin instructions. Unnamed OTC eye drops for dryness: Instill 1 drop into both eyes in the morning     VENTOLIN HFA 108 (90 BASE) MCG/ACT inhaler Inhale 2 puffs into the lungs every 6 (six) hours as needed for wheezing or shortness of breath.     budesonide (ENTOCORT EC) 3 MG 24 hr capsule Take 3 capsules (9 mg total) by mouth in the morning. (Patient not taking: Reported on 03/17/2023) 90 capsule 12   colestipol (COLESTID) 1 g tablet 2 tab(s) orally 2 times a day for 30 days (Patient not taking: Reported on 03/17/2023)     XIFAXAN 550 MG TABS tablet Take 550 mg by mouth 3 (three) times daily. (Patient not taking: Reported on 03/17/2023)     No current facility-administered medications for this visit.     Review of Systems    She denies chest pain, palpitations, pnd, orthopnea, n, v, syncope, edema, weight gain, or early satiety. All other systems reviewed and are otherwise negative except as noted above.   Physical Exam    VS:  BP 108/64   Pulse 87   Ht 5\' 4"  (1.626 m)   Wt 112 lb 12.8 oz (51.2 kg)   SpO2 94%   BMI 19.36 kg/m  GEN: Well nourished, well developed, in no acute distress. HEENT: normal. Neck: Supple, no JVD, carotid bruits, or masses. Cardiac: RRR, no murmurs, rubs, or gallops. No clubbing, cyanosis, edema.  Radials/DP/PT 2+ and equal bilaterally.  Respiratory:  Respirations regular and unlabored, clear to auscultation bilaterally. GI: Soft, nontender, nondistended, BS + x 4. MS: no deformity or atrophy. Skin: warm and dry, no rash. Neuro:  Strength and sensation are intact. Psych: Normal affect.  Accessory Clinical Findings    ECG personally reviewed by me today - EKG Interpretation Date/Time:  Thursday March 17 2023 15:56:20 EST Ventricular Rate:  87 PR Interval:  134 QRS Duration:  78 QT Interval:  386 QTC  Calculation: 464 R Axis:   136  Text Interpretation: Sinus rhythm with marked sinus arrhythmia T wave abnormality No significant change since last tracing Confirmed by Bernadene Person ([id number]) on 03/17/2023 4:03:58 PM  - no acute changes.   Lab Results  Component Value Date   WBC 9.6 01/31/2023   HGB 15.6 (H) 01/31/2023   HCT 47.8 (H) 01/31/2023   MCV 100.4 (H) 01/31/2023   PLT 259 01/31/2023   Lab Results  Component Value Date   CREATININE 0.46 01/31/2023   BUN 5 (L) 01/31/2023   NA 139 01/31/2023   K 3.4 (L) 01/31/2023   CL 96 (L) 01/31/2023   CO2 30 01/31/2023   Lab Results  Component Value Date   ALT 21 01/31/2023   AST 25 01/31/2023   ALKPHOS 84 01/31/2023   BILITOT 0.6 01/31/2023   Lab Results  Component Value Date   CHOL 93 06/08/2019   HDL 25 (L) 06/08/2019   LDLCALC 44 06/08/2019   TRIG 120 06/08/2019   CHOLHDL 3.7 06/08/2019    Lab Results  Component Value Date   HGBA1C  06/28/2010    5.6 (NOTE)                                                                       According to the ADA Clinical Practice Recommendations for 2011, when HbA1c is used as a screening test:   >=6.5%   Diagnostic of Diabetes Mellitus           (if abnormal result  is confirmed)  5.7-6.4%   Increased risk of developing Diabetes Mellitus  References:Diagnosis and Classification of Diabetes Mellitus,Diabetes Care,2011,34(Suppl 1):S62-S69 and Standards of Medical Care in         Diabetes - 2011,Diabetes Care,2011,34  (Suppl 1):S11-S61.    Assessment & Plan   1. Abnormal EKG/PACs/PVCs/NSVT/coronary calcification: She has a documented history of NSVT, bigeminy, PACs, U waves on prior EKG.  Has had chronic hypokalemia.  Potassium was 3.4 in 01/2023.  Will repeat BMET.  She has evidence of severe coronary calcification on prior CT chest.  She has stable chronic dyspnea, unchanged from prior visits, denies any chest pain.   She declines any further ischemic evaluation at this time, she is not  interested in any invasive procedures.    2. Hypertension: She has had a recent borderline low BP with associated lightheadedness, fatigue. Continue to monitor BP, symptoms.  For now, continue current antihypertensive regimen.    3. Dilation of ascending aorta: Echocardiogram in February 2021 showed EF 65 to 60%, G1 DD, normal LV function, moderate dilation of ascending aorta measuring 39 mm. Most recent CT of the chest in 01/2022 showed ascending thoracic aorta dilation, 4.4 cm in diameter.  Repeat CT in 01/2023 showed stable dilation of ascending thoracic aorta measuring up to 4.4 cm, unchanged compared to prior CT. Plan for repeat CT chest/aorta in 1 year.   4. Hyperlipidemia: LDL was 83 in 04/2022.  Given evidence of coronary calcification on CT, will increase Crestor to 10 mg daily.  She has a physical scheduled with her PCP in January 2025, she will need fasting lipids, CMET at  that time.   5. Tobacco use: She continues to smoke.  Full cessation advised.   6. Disposition: Follow-up in 6 months with Dr. Rennis Golden, sooner if needed.       Joylene Grapes, NP 03/17/2023, 4:46 PM

## 2023-03-17 ENCOUNTER — Ambulatory Visit: Payer: Medicare PPO | Attending: Internal Medicine | Admitting: Nurse Practitioner

## 2023-03-17 ENCOUNTER — Other Ambulatory Visit: Payer: Self-pay

## 2023-03-17 ENCOUNTER — Encounter: Payer: Self-pay | Admitting: Nurse Practitioner

## 2023-03-17 VITALS — BP 108/64 | HR 87 | Ht 64.0 in | Wt 112.8 lb

## 2023-03-17 DIAGNOSIS — I491 Atrial premature depolarization: Secondary | ICD-10-CM

## 2023-03-17 DIAGNOSIS — E782 Mixed hyperlipidemia: Secondary | ICD-10-CM

## 2023-03-17 DIAGNOSIS — I2510 Atherosclerotic heart disease of native coronary artery without angina pectoris: Secondary | ICD-10-CM

## 2023-03-17 DIAGNOSIS — Z720 Tobacco use: Secondary | ICD-10-CM

## 2023-03-17 DIAGNOSIS — I10 Essential (primary) hypertension: Secondary | ICD-10-CM

## 2023-03-17 DIAGNOSIS — I77810 Thoracic aortic ectasia: Secondary | ICD-10-CM

## 2023-03-17 DIAGNOSIS — I4729 Other ventricular tachycardia: Secondary | ICD-10-CM

## 2023-03-17 DIAGNOSIS — I493 Ventricular premature depolarization: Secondary | ICD-10-CM

## 2023-03-17 MED ORDER — ROSUVASTATIN CALCIUM 10 MG PO TABS: 10.0000 mg | ORAL_TABLET | Freq: Every day | ORAL | 3 refills | Status: DC

## 2023-03-17 NOTE — Patient Instructions (Signed)
Medication Instructions:  Increase Crestor 10 mg daily  *If you need a refill on your cardiac medications before your next appointment, please call your pharmacy*   Lab Work: Your physician recommends that you return for lab work in 2 weeks. BMET   Testing/Procedures: NONE ordered at this time of appointment     Follow-Up: At [facility], you and your health needs are our priority.  As part of our continuing mission to provide you with exceptional heart care, we have created designated Provider Care Teams.  These Care Teams include your primary Cardiologist (physician) and Advanced Practice Providers (APPs -  Physician Assistants and Nurse Practitioners) who all work together to provide you with the care you need, when you need it.  We recommend signing up for the patient portal called "MyChart".  Sign up information is provided on this After Visit Summary.  MyChart is used to connect with patients for Virtual Visits (Telemedicine).  Patients are able to view lab/test results, encounter notes, upcoming appointments, etc.  Non-urgent messages can be sent to your provider as well.   To learn more about what you can do with MyChart, go to [email or web address].    Your next appointment:   6 month(s)  Provider:   Chrystie Nose, MD     Other Instructions Please complete Fasting Lipid Panel with your PCP.

## 2023-03-18 DIAGNOSIS — H26492 Other secondary cataract, left eye: Secondary | ICD-10-CM | POA: Diagnosis not present

## 2023-03-18 DIAGNOSIS — Z961 Presence of intraocular lens: Secondary | ICD-10-CM | POA: Diagnosis not present

## 2023-03-18 DIAGNOSIS — H26491 Other secondary cataract, right eye: Secondary | ICD-10-CM | POA: Diagnosis not present

## 2023-03-30 DIAGNOSIS — I491 Atrial premature depolarization: Secondary | ICD-10-CM | POA: Diagnosis not present

## 2023-03-30 DIAGNOSIS — I10 Essential (primary) hypertension: Secondary | ICD-10-CM | POA: Diagnosis not present

## 2023-03-30 DIAGNOSIS — I4729 Other ventricular tachycardia: Secondary | ICD-10-CM | POA: Diagnosis not present

## 2023-03-30 DIAGNOSIS — I2510 Atherosclerotic heart disease of native coronary artery without angina pectoris: Secondary | ICD-10-CM | POA: Diagnosis not present

## 2023-03-30 DIAGNOSIS — I77810 Thoracic aortic ectasia: Secondary | ICD-10-CM | POA: Diagnosis not present

## 2023-03-30 DIAGNOSIS — Z720 Tobacco use: Secondary | ICD-10-CM | POA: Diagnosis not present

## 2023-03-30 DIAGNOSIS — E782 Mixed hyperlipidemia: Secondary | ICD-10-CM | POA: Diagnosis not present

## 2023-03-30 DIAGNOSIS — I493 Ventricular premature depolarization: Secondary | ICD-10-CM | POA: Diagnosis not present

## 2023-03-31 ENCOUNTER — Telehealth: Payer: Self-pay

## 2023-03-31 LAB — BASIC METABOLIC PANEL
BUN/Creatinine Ratio: 10 — ABNORMAL LOW (ref 12–28)
BUN: 5 mg/dL — ABNORMAL LOW (ref 8–27)
CO2: 28 mmol/L (ref 20–29)
Calcium: 8.9 mg/dL (ref 8.7–10.3)
Chloride: 100 mmol/L (ref 96–106)
Creatinine, Ser: 0.51 mg/dL — ABNORMAL LOW (ref 0.57–1.00)
Glucose: 112 mg/dL — ABNORMAL HIGH (ref 70–99)
Potassium: 3.8 mmol/L (ref 3.5–5.2)
Sodium: 145 mmol/L — ABNORMAL HIGH (ref 134–144)
eGFR: 100 mL/min/{1.73_m2} (ref >59)

## 2023-03-31 NOTE — Telephone Encounter (Signed)
Left a detailed message with lab results. Pt advised to call back with any questions or concerns.

## 2023-04-06 DIAGNOSIS — H353132 Nonexudative age-related macular degeneration, bilateral, intermediate dry stage: Secondary | ICD-10-CM | POA: Diagnosis not present

## 2023-04-07 DIAGNOSIS — H26491 Other secondary cataract, right eye: Secondary | ICD-10-CM | POA: Diagnosis not present

## 2023-04-13 DIAGNOSIS — H524 Presbyopia: Secondary | ICD-10-CM | POA: Diagnosis not present

## 2023-04-13 DIAGNOSIS — H26491 Other secondary cataract, right eye: Secondary | ICD-10-CM | POA: Diagnosis not present

## 2023-04-13 DIAGNOSIS — H5213 Myopia, bilateral: Secondary | ICD-10-CM | POA: Diagnosis not present

## 2023-04-13 DIAGNOSIS — H26492 Other secondary cataract, left eye: Secondary | ICD-10-CM | POA: Diagnosis not present

## 2023-04-13 DIAGNOSIS — Z961 Presence of intraocular lens: Secondary | ICD-10-CM | POA: Diagnosis not present

## 2023-05-03 DIAGNOSIS — I10 Essential (primary) hypertension: Secondary | ICD-10-CM | POA: Diagnosis not present

## 2023-05-03 DIAGNOSIS — E7800 Pure hypercholesterolemia, unspecified: Secondary | ICD-10-CM | POA: Diagnosis not present

## 2023-05-03 DIAGNOSIS — Z0000 Encounter for general adult medical examination without abnormal findings: Secondary | ICD-10-CM | POA: Diagnosis not present

## 2023-05-05 DIAGNOSIS — N390 Urinary tract infection, site not specified: Secondary | ICD-10-CM | POA: Diagnosis not present

## 2023-05-06 DIAGNOSIS — H353132 Nonexudative age-related macular degeneration, bilateral, intermediate dry stage: Secondary | ICD-10-CM | POA: Diagnosis not present

## 2023-05-11 DIAGNOSIS — Z85118 Personal history of other malignant neoplasm of bronchus and lung: Secondary | ICD-10-CM | POA: Diagnosis not present

## 2023-05-11 DIAGNOSIS — E7800 Pure hypercholesterolemia, unspecified: Secondary | ICD-10-CM | POA: Diagnosis not present

## 2023-05-11 DIAGNOSIS — R748 Abnormal levels of other serum enzymes: Secondary | ICD-10-CM | POA: Diagnosis not present

## 2023-05-11 DIAGNOSIS — M810 Age-related osteoporosis without current pathological fracture: Secondary | ICD-10-CM | POA: Diagnosis not present

## 2023-05-11 DIAGNOSIS — I700 Atherosclerosis of aorta: Secondary | ICD-10-CM | POA: Diagnosis not present

## 2023-05-11 DIAGNOSIS — I10 Essential (primary) hypertension: Secondary | ICD-10-CM | POA: Diagnosis not present

## 2023-05-11 DIAGNOSIS — J449 Chronic obstructive pulmonary disease, unspecified: Secondary | ICD-10-CM | POA: Diagnosis not present

## 2023-05-11 DIAGNOSIS — Z0000 Encounter for general adult medical examination without abnormal findings: Secondary | ICD-10-CM | POA: Diagnosis not present

## 2023-05-11 DIAGNOSIS — E559 Vitamin D deficiency, unspecified: Secondary | ICD-10-CM | POA: Diagnosis not present

## 2023-05-18 DIAGNOSIS — H02889 Meibomian gland dysfunction of unspecified eye, unspecified eyelid: Secondary | ICD-10-CM | POA: Diagnosis not present

## 2023-05-18 DIAGNOSIS — H26493 Other secondary cataract, bilateral: Secondary | ICD-10-CM | POA: Diagnosis not present

## 2023-05-18 DIAGNOSIS — R945 Abnormal results of liver function studies: Secondary | ICD-10-CM | POA: Diagnosis not present

## 2023-05-18 DIAGNOSIS — K838 Other specified diseases of biliary tract: Secondary | ICD-10-CM | POA: Diagnosis not present

## 2023-05-18 DIAGNOSIS — H353132 Nonexudative age-related macular degeneration, bilateral, intermediate dry stage: Secondary | ICD-10-CM | POA: Diagnosis not present

## 2023-05-18 DIAGNOSIS — Z961 Presence of intraocular lens: Secondary | ICD-10-CM | POA: Diagnosis not present

## 2023-05-18 DIAGNOSIS — R748 Abnormal levels of other serum enzymes: Secondary | ICD-10-CM | POA: Diagnosis not present

## 2023-06-01 ENCOUNTER — Other Ambulatory Visit: Payer: Self-pay | Admitting: Student

## 2023-06-05 DIAGNOSIS — H353132 Nonexudative age-related macular degeneration, bilateral, intermediate dry stage: Secondary | ICD-10-CM | POA: Diagnosis not present

## 2023-06-22 DIAGNOSIS — L821 Other seborrheic keratosis: Secondary | ICD-10-CM | POA: Diagnosis not present

## 2023-06-22 DIAGNOSIS — L814 Other melanin hyperpigmentation: Secondary | ICD-10-CM | POA: Diagnosis not present

## 2023-06-22 DIAGNOSIS — D1801 Hemangioma of skin and subcutaneous tissue: Secondary | ICD-10-CM | POA: Diagnosis not present

## 2023-06-22 DIAGNOSIS — L578 Other skin changes due to chronic exposure to nonionizing radiation: Secondary | ICD-10-CM | POA: Diagnosis not present

## 2023-06-22 DIAGNOSIS — D485 Neoplasm of uncertain behavior of skin: Secondary | ICD-10-CM | POA: Diagnosis not present

## 2023-06-22 DIAGNOSIS — L570 Actinic keratosis: Secondary | ICD-10-CM | POA: Diagnosis not present

## 2023-06-22 DIAGNOSIS — D229 Melanocytic nevi, unspecified: Secondary | ICD-10-CM | POA: Diagnosis not present

## 2023-07-05 DIAGNOSIS — H353132 Nonexudative age-related macular degeneration, bilateral, intermediate dry stage: Secondary | ICD-10-CM | POA: Diagnosis not present

## 2023-07-05 DIAGNOSIS — M810 Age-related osteoporosis without current pathological fracture: Secondary | ICD-10-CM | POA: Diagnosis not present

## 2023-08-04 DIAGNOSIS — H353132 Nonexudative age-related macular degeneration, bilateral, intermediate dry stage: Secondary | ICD-10-CM | POA: Diagnosis not present

## 2023-09-03 DIAGNOSIS — H353132 Nonexudative age-related macular degeneration, bilateral, intermediate dry stage: Secondary | ICD-10-CM | POA: Diagnosis not present

## 2023-10-03 DIAGNOSIS — H353132 Nonexudative age-related macular degeneration, bilateral, intermediate dry stage: Secondary | ICD-10-CM | POA: Diagnosis not present

## 2023-11-02 DIAGNOSIS — H353132 Nonexudative age-related macular degeneration, bilateral, intermediate dry stage: Secondary | ICD-10-CM | POA: Diagnosis not present

## 2023-11-09 DIAGNOSIS — Z961 Presence of intraocular lens: Secondary | ICD-10-CM | POA: Diagnosis not present

## 2023-11-09 DIAGNOSIS — H353132 Nonexudative age-related macular degeneration, bilateral, intermediate dry stage: Secondary | ICD-10-CM | POA: Diagnosis not present

## 2023-11-09 DIAGNOSIS — H02889 Meibomian gland dysfunction of unspecified eye, unspecified eyelid: Secondary | ICD-10-CM | POA: Diagnosis not present

## 2023-11-09 DIAGNOSIS — H26493 Other secondary cataract, bilateral: Secondary | ICD-10-CM | POA: Diagnosis not present

## 2023-11-28 ENCOUNTER — Other Ambulatory Visit: Payer: Self-pay | Admitting: Internal Medicine

## 2023-12-02 DIAGNOSIS — H353132 Nonexudative age-related macular degeneration, bilateral, intermediate dry stage: Secondary | ICD-10-CM | POA: Diagnosis not present

## 2023-12-16 ENCOUNTER — Encounter: Payer: Self-pay | Admitting: Internal Medicine

## 2023-12-16 ENCOUNTER — Ambulatory Visit: Attending: Internal Medicine | Admitting: Internal Medicine

## 2023-12-16 VITALS — BP 92/80 | HR 83 | Ht 64.0 in | Wt 120.0 lb

## 2023-12-16 DIAGNOSIS — I77810 Thoracic aortic ectasia: Secondary | ICD-10-CM

## 2023-12-16 DIAGNOSIS — I2510 Atherosclerotic heart disease of native coronary artery without angina pectoris: Secondary | ICD-10-CM | POA: Diagnosis not present

## 2023-12-16 DIAGNOSIS — I10 Essential (primary) hypertension: Secondary | ICD-10-CM | POA: Diagnosis not present

## 2023-12-16 DIAGNOSIS — E782 Mixed hyperlipidemia: Secondary | ICD-10-CM

## 2023-12-16 DIAGNOSIS — E785 Hyperlipidemia, unspecified: Secondary | ICD-10-CM | POA: Diagnosis not present

## 2023-12-16 NOTE — Progress Notes (Signed)
 OFFICE NOTE  Chief Complaint:  No complaints  Primary Care Physician: Royden Ronal Czar, FNP  HPI:  [name] is a 73 year old female I have been following for anxiety as well as difficult to control hypertension. Blood pressure has been much better controlled on her current regimen of Micardis /HCTZ as well as the metoprolol . She is also on Crestor  for dyslipidemia and has had a well-controlled lipid profile. Recently, she has been concerned about fast intestinal transits, diarrhea, and/or stomach gurgling. She takes cholestyramine  for this and was started on Dexilant  for reflux-type symptoms. Although this has been helpful, she continues to have some of those complaints, but no other active cardiac complaints. Unfortunately, she continues to smoke. We discussed smoking cessation today. However, she is not quite ready to quit due to stress in her job. Fortunately her boss quit in the past year and her stress is improved significantly. She tells her she has 2 more years left and then she can retire with attention. Overall she thinks she is doing very well. The only other new issue is that she broke a small bone in the left foot but that has healed up very quickly.  [name] returns today for followup. She reports having some upper midepigastric pain but has not been taking her PPI regularly. Just reports not taking her cholesterol medication regularly. She doesn't be taken her blood pressure medicines and her blood pressure is well-controlled today.  I saw [name] back today in follow-up. She tells me that she is only a few days away from retirement. Unfortunately there'll be a 3-4 week delay in insurance coverage before she can start with her new state insurance. She was told that she could get insurance coverage from [employer] however the cost would be more than $800. From a medical standpoint she seems to be doing well and is interested in quitting cigarettes. She got a prescription for  Chantix but has not yet started. Blood pressure is well-controlled. She has had good cholesterol control on Crestor .  03/26/2016  [name] returns today for follow-up. Blood pressure appears to be well-controlled. She denies any chest pain, but has had some worsening fatigue and dyspnea. She says she gets short of breath walking up stairs and has had recent decreased exercise tolerance.   03/25/2017  [name] was seen today in follow-up.  She recently won a trip to [place] and unfortunately fell and fractured 3 ribs.  She seems to be recovering from that.  She is not taking pain medicine regularly.  She has had some nausea and shortness of breath which is consistent.  She had a stress test last year which was negative for ischemia and showed normal LV function.  She denies any worsening or new symptoms associated with that.  04/06/2018  [name] is seen today in follow-up.  Overall she is doing well.  She denies any chest pain or worsening shortness of breath.  Her EKG, personally reviewed today shows some anteroseptal T wave inversions which are slightly worse than previously.  Despite this she is completely asymptomatic.  She is trying to work on smoking cessation.  She says she still struggles with sleep at night.  Of note her weight has been declining.  She is borderline underweight today.  Labs from June 2019 showed total cholesterol 140, HDL 56, LDL 66 and triglycerides 92.  Creatinine was normal.  04/09/2019  [name] returns today for follow-up.  Her weight is down a little more.  She  continues to smoke and recently has reported increasing productive cough and phlegm.  I was reviewing old records and indicated that she had a CT scan in 2012 of the chest which showed emphysematous disease and some pulmonary nodules.  I do not see any follow-up of that in our system nor even a chest x-ray.  Is not clear whether she has had follow-up with this with her primary care provider.  She continues  to smoke and her mother actually died of lung cancer.  She also told me that her brother recently died.  He has been in the hospital couple times with coronary disease and had a bypass.  Blood pressure is actually low today.  Recently she was seen by Mercy Hails, PA-C, who restarted Toprol .  Apparently she was not taking this medicine but restarted it however is also on amlodipine  in combination telmisartan /HCTZ.  Blood pressure today is accordingly low.  09/10/2019  [name] is seen today in follow-up.  She had an extensive hospitalization after we identified a primary lung tumor.  She had wedge resection of this with a protracted hospitalization including an episode of ventricular tachycardia which was thought to be related to severe electrolyte derangement.  An echo was performed which showed normal systolic function.  She declined heart catheterization and ultimately underwent outpatient stress testing which was negative for ischemia.  She said she has been fairly slow to recover and continues to have some neuropathic symptoms as well as joint aches and pains.  Recently her blood pressure started climbing higher.  She was taken off of her telmisartan /HCTZ which I advised restarting.  Blood pressure today is excellent at 112/72.  Recent lipids in February showed total cholesterol 93, triglycerides 120, HDL 25 and LDL 44 which seemed to be more than adequately controlled on rosuvastatin  10 mg daily.  Her beta-blocker was stopped during her hospitalization apparently.  07/18/2020  [name] is seen today for follow-up.  Overall she seems to be doing well.  She does get some infrequent palpitations.  I had switched of her medicines last time because needed to add back her beta-blocker.  She might need just a little more and I advised her that she could take an extra half pill if necessary.  Blood pressure looks excellent today.  She gets some occasional sighing which may happen several times a week.  This is  primarily pulmonary likely represents a degree of atelectasis.  She is followed by pulmonologist.  12/17/2023  [name] returns today for follow-up.  Overall she seems to be doing well although has a number of chronic concerns.  She has ongoing follow-up with Dr. Gatha for her history of lung cancer.  She was noted to be hypotensive today 92/80.  Her weight has been quite low with a BMI of 20.  EKG shows what is likely sinus rhythm today with inferior and anterolateral T wave inversions that were present previously.  She denies any chest pain symptoms.  She has been on amlodipine  2.5 mg twice daily however with her lower blood pressures wonder if she should be on less.  She had lipid testing in January showing total cholesterol 166, HDL 84, triglycerides 87 and LDL 66.  PMHx:  Past Medical History:  Diagnosis Date   Active smoker    per pt quit 01/ 2021 smoking prior to lung lobectomy 02/ 2021 but started back smoking 06/ 2022 stated 5 cig per day   Asthma    Chronic diarrhea  Complication of anesthesia    hard to wake and ponv   Dyslipidemia    Emphysema/COPD (HCC)    pulmologist--- dr kara;   no oxygen   Endometriosis    Family history of adverse reaction to anesthesia    brother--- slow to wake   Finger fracture, left    left ring and small proximal phalagel fx's   GAD (generalized anxiety disorder)    GERD (gastroesophageal reflux disease)    Hiatal hernia    History of cervical dysplasia    s/p gyn crysurgery yrs ago   History of gastric ulcer 05/2019   post lung lobectomy w/ esophagitis   History of TIA (transient ischemic attack)    12-22-2021  per pt remote TIA > 10 yrs ago,  no residual   Hypertension    followed by cardiologist and pcp;   cardiac cath 11-20-2002 completely normal;   last nuclear stress test 08-14-2010 low risk normal perfusion no ischemia, nuclear ef 59%   Intermittent palpitations    followed by dr Cung Masterson   Lymphocytic colitis    followed by  [facility]   Macular degeneration of both eyes    Migraine    Non-small cell lung cancer, right (HCC) 05/2019   surgeon-- dr kerrin / oncologist-- dr ozzie  pulmologist-- dr j. kara;   05-28-2019  s/p  right upper lobectomy w/ node dissection's,  Stage IA,  no chemo or radiation   NSVT (nonsustained ventricular tachycardia) (HCC)    hx bigemy  and psot op wide complex tachycardia s/p lung lobectomy 02/ 2021   OSA (obstructive sleep apnea)    does not wear her CPAP because it gave me respiratory issues (bronchities and pneumonia)   Osteoporosis    PONV (postoperative nausea and vomiting)    Secondary polycythemia    evaluation by dr odean ronco note in epic 11-03-2015  per note due to tobacco use and respiratory issues   Thoracic ascending aortic aneurysm (HCC) 02/02/2022    Past Surgical History:  Procedure Laterality Date   BIOPSY  06/05/2019   Procedure: BIOPSY;  Surgeon: Rollin Dover, MD;  [facility] ENDOSCOPY;  Service: Endoscopy;;   CARDIAC CATHETERIZATION  11/20/2002   @[facility]  by dr gamble;   completey normal;  patent coronary arteries   CATARACT EXTRACTION W/ INTRAOCULAR LENS IMPLANT Bilateral 08/2020   ESOPHAGOGASTRODUODENOSCOPY Left 06/05/2019   Procedure: ESOPHAGOGASTRODUODENOSCOPY (EGD);  Surgeon: Rollin Dover, MD;  Location: [facility] ENDOSCOPY;  Service: Endoscopy;  Laterality: Left;   GYNECOLOGIC CRYOSURGERY     many yrs ago   INTERCOSTAL NERVE BLOCK Right 05/28/2019   Procedure: Intercostal Nerve Block;  Surgeon: kerrin Elspeth BROCKS, MD;  Location: [facility] OR;  Service: Thoracic;  Laterality: Right;   LAPAROSCOPIC CHOLECYSTECTOMY  10/15/2002   @[facility]    LUMBAR DISC SURGERY  08/15/2000   @[facility]  by dr alix;   left L3--4   (and also lumbar surgery approx. 1989)   NASAL SINUS SURGERY  2004   approx   NODE DISSECTION  05/28/2019   Procedure: Node Dissection;  Surgeon: kerrin Elspeth BROCKS, MD;  Location: [facility] OR;  Service: Thoracic;;   OPEN REDUCTION INTERNAL FIXATION (ORIF)  PROXIMAL PHALANX Left 12/29/2021   Procedure: Left ring and small finger proximal phalangeal closed reduction percutaneous pinning;  Surgeon: Alyse Agent, MD;  Location: [facility] [place];  Service: Orthopedics;  Laterality: Left;   ROTATOR CUFF REPAIR Left    x2  last one  1990s   THORACOSCOPY  05/28/2019   THORASCOPY-WEDGE  RESECTION AND  RIGHT  UPPER LOBECTOMY (Right)   THROAT SURGERY  1997   REMOVAL OF TUMOR  (PER PT BENIGN)   TONSILLECTOMY     age 84   VAGINAL HYSTERECTOMY  2000   w/  BILATERAL SALPINGOOPHORECTOMY    FAMHx:  Family History  Problem Relation Age of Onset   Hypertension Mother    Cancer Mother        LIVER AND LUNG   Hypertension Brother    Heart disease Brother    Hypertension Maternal Grandfather    Heart disease Maternal Grandfather    Heart attack Maternal Grandfather     SOCHx:   reports that she has been smoking cigarettes. She started smoking about 46 years ago. She has a 42 pack-year smoking history. She has never used smokeless tobacco. She reports current alcohol use of about 15.0 standard drinks of alcohol per week. She reports that she does not use drugs.  ALLERGIES:  Allergies  Allergen Reactions   Iodinated Contrast Media Itching and Other (See Comments)    itching but no hives just after iv contrast injection w/ 13 hr prep, future contrast not advised unless absolutely necessary, 13 hr prep would still be advised.//a.calhoun   Ioxaglate Itching and Other (See Comments)    itching but no hives just after iv contrast injection w/ 13 hr prep, future contrast not advised unless absolutely necessary, 13 hr prep would still be advised.//a.calhoun   Omnipaque  Alin.Ard ] Anaphylaxis   Buprenorphine Hcl Nausea And Vomiting   Codeine Nausea And Vomiting   Erythromycin Other (See Comments)    Hospitalized for 5 days after receiving too much   Morphine And Codeine Nausea And Vomiting   Nicoderm [Nicotine ] Itching and Other (See Comments)     Patches cause itching   Oyster Extract Nausea And Vomiting and Other (See Comments)    I went into shock    ROS: Pertinent items noted in HPI and remainder of comprehensive ROS otherwise negative.  HOME MEDS: Current Outpatient Medications  Medication Sig Dispense Refill   ALPRAZolam  (XANAX ) 0.5 MG tablet Take 0.5 mg by mouth 3 (three) times daily as needed for anxiety or sleep.     amLODipine  (NORVASC ) 2.5 MG tablet TAKE 1 TABLET BY MOUTH DAILY. (Patient taking differently: Take 2.5 mg by mouth in the morning and at bedtime.) 180 tablet 1   aspirin  EC 81 MG tablet Take 81 mg by mouth every other day.     bismuth subsalicylate (PEPTO BISMOL) 262 MG chewable tablet Chew 524 mg by mouth as needed for diarrhea or loose stools.     BREO ELLIPTA 100-25 MCG/ACT AEPB 1 puff daily.     cholestyramine  (QUESTRAN ) 4 GM/DOSE powder Take 4 g by mouth daily as needed (for diarrhea).     denosumab  (PROLIA ) 60 MG/ML SOLN injection Inject 60 mg into the skin every 6 (six) months. Administer in upper arm, thigh, or abdomen     ergocalciferol  (VITAMIN D2) 1.25 MG (50000 UT) capsule Take 50,000 Units by mouth every Saturday.     irbesartan  (AVAPRO ) 150 MG tablet TAKE 1 TABLET(150 MG) BY MOUTH DAILY 90 tablet 3   loperamide  (IMODIUM  A-D) 2 MG tablet Take 2 mg by mouth 4 (four) times daily as needed for diarrhea or loose stools.     metoprolol  succinate (TOPROL -XL) 25 MG 24 hr tablet TAKE 1 TABLET(25 MG) BY MOUTH DAILY 90 tablet 0   montelukast  (SINGULAIR ) 10 MG tablet Take 10 mg by mouth at bedtime.  0   Multiple Vitamins-Minerals (PRESERVISION AREDS 2) CAPS Take 1 capsule by mouth in the morning and at bedtime.     pantoprazole  (PROTONIX ) 40 MG tablet Take 1 tablet (40 mg total) by mouth 2 (two) times daily. (Patient taking differently: Take 40 mg by mouth See admin instructions. Take 40 mg by mouth before supper and once a day as needed for reflux) 60 tablet 3   rosuvastatin  (CRESTOR ) 10 MG tablet Take 1  tablet (10 mg total) by mouth daily. 90 tablet 3   SPIRIVA  RESPIMAT 2.5 MCG/ACT AERS INHALE 2 PUFFS INTO THE LUNGS DAILY (Patient taking differently: Inhale 2 puffs into the lungs at bedtime.) 4 g 0   UNKNOWN TO PATIENT Take 1-2 tablets by mouth See admin instructions. [place] brand otc sleep aid: Take 1-2 tablets by mouth as needed for sleep     UNKNOWN TO PATIENT Place 1 drop into both eyes See admin instructions. Unnamed OTC eye drops for dryness: Instill 1 drop into both eyes in the morning     VENTOLIN  HFA 108 (90 BASE) MCG/ACT inhaler Inhale 2 puffs into the lungs every 6 (six) hours as needed for wheezing or shortness of breath.     budesonide  (ENTOCORT EC ) 3 MG 24 hr capsule Take 3 capsules (9 mg total) by mouth in the morning. (Patient not taking: Reported on 12/16/2023) 90 capsule 12   CALCIUM  PO Take 1 tablet by mouth 2 (two) times a week. (Patient not taking: Reported on 12/16/2023)     colestipol (COLESTID) 1 g tablet 2 tab(s) orally 2 times a day for 30 days (Patient not taking: Reported on 12/16/2023)     eszopiclone  3 MG TABS Take 1 tablet (3 mg total) by mouth at bedtime as needed (for sleep- Take immediately before bedtime). (Patient not taking: Reported on 12/16/2023)  0   gabapentin  (NEURONTIN ) 300 MG capsule TAKE 2 CAPSULES(600 MG) BY MOUTH THREE TIMES DAILY (Patient not taking: Reported on 12/16/2023) 180 capsule 2   XIFAXAN 550 MG TABS tablet Take 550 mg by mouth 3 (three) times daily. (Patient not taking: Reported on 12/16/2023)     No current facility-administered medications for this visit.    LABS/IMAGING: No results found for this or any previous visit (from the past 48 hours).  No results found.   VITALS: BP 92/80   Pulse 83   Ht 5' 4 (1.626 m)   Wt 120 lb (54.4 kg)   SpO2 93%   BMI 20.60 kg/m   EXAM: General appearance: alert and no distress Neck: no adenopathy, no carotid bruit, no JVD, supple, symmetrical, trachea midline and thyroid  not enlarged,  symmetric, no tenderness/mass/nodules Lungs: clear to auscultation bilaterally Heart: regular rate and rhythm, S1, S2 normal, no murmur, click, rub or gallop Abdomen: soft, non-tender; bowel sounds normal; no masses,  no organomegaly Extremities: extremities normal, atraumatic, no cyanosis or edema Pulses: 2+ and symmetric Skin: Skin color, texture, turgor normal. No rashes or lesions Neurologic: Grossly normal  EKG: EKG Interpretation Date/Time:  Friday December 16 2023 16:06:28 EDT Ventricular Rate:  83 PR Interval:    QRS Duration:  76 QT Interval:  386 QTC Calculation: 453 R Axis:   -53  Text Interpretation: Sinus rhythm Indeterminate axis Septal infarct (cited on or before 16-Dec-2023) ST & T wave abnormality, consider inferior ischemia ST & T wave abnormality, consider anterolateral ischemia When compared with ECG of 17-Mar-2023 15:56, No significant change since last tracing Confirmed by Mona Kent [phone]) on 12/16/2023 4:18:02  PM    ASSESSMENT:  Dyspnea on exertion and fatigue-low risk nuclear stress test (03/2016) Hypertension-controlled Hyperlipidemia- on Crestor  Tobacco abuse History of lung nodules/emphysema Primary lung tumor status post resection (04/2019) History of NSVT/Ventricular bigeminy related to electrolyte abnormalities Dilated ascending thoracic aorta at 4.4 cm (01/2023)  PLAN: 1.   [name] seems to be doing fairly well.  Her blood pressure has been running low recently.  She has been on amlodipine  2.5 mg twice daily.  I advised her to decrease that to 2.5 mg daily.  Otherwise we will continue her current medicines.  Her lipids are at goal.  She denies any chest pain symptoms.  She was found to have a dilated ascending thoracic aorta up to 4.4 cm by CT in October 2024.  Repeat CT scan is scheduled for this October.  Plan follow-up in a year or sooner as necessary.  Vinie KYM Maxcy, MD, [facility], FNLA, FACP  [place]  [facility]  Medical Director  of the [facility]  [facility] Diplomate of the American Board of Clinical Lipidology Attending Cardiologist  Direct Dial: [phone]  Fax: [phone]  Website:  [email or web address] Vinie BROCKS Ricci Paff 12/16/2023, 4:18 PM

## 2023-12-16 NOTE — Patient Instructions (Signed)
 Medication Instructions:  Take AMLODIPINE  2.5mg  once daily  *If you need a refill on your cardiac medications before your next appointment, please call your pharmacy*   Follow-Up: At [facility], you and your health needs are our priority.  As part of our continuing mission to provide you with exceptional heart care, our providers are all part of one team.  This team includes your primary Cardiologist (physician) and Advanced Practice Providers or APPs (Physician Assistants and Nurse Practitioners) who all work together to provide you with the care you need, when you need it.  Your next appointment:    12 months with Dr. Mona

## 2024-01-01 DIAGNOSIS — H353132 Nonexudative age-related macular degeneration, bilateral, intermediate dry stage: Secondary | ICD-10-CM | POA: Diagnosis not present

## 2024-01-10 DIAGNOSIS — M810 Age-related osteoporosis without current pathological fracture: Secondary | ICD-10-CM | POA: Diagnosis not present

## 2024-01-24 ENCOUNTER — Ambulatory Visit (HOSPITAL_COMMUNITY)
Admission: RE | Admit: 2024-01-24 | Discharge: 2024-01-24 | Disposition: A | Source: Ambulatory Visit | Attending: Internal Medicine | Admitting: Internal Medicine

## 2024-01-24 ENCOUNTER — Inpatient Hospital Stay: Payer: Medicare PPO | Attending: Internal Medicine

## 2024-01-24 DIAGNOSIS — J4489 Other specified chronic obstructive pulmonary disease: Secondary | ICD-10-CM | POA: Diagnosis not present

## 2024-01-24 DIAGNOSIS — I2510 Atherosclerotic heart disease of native coronary artery without angina pectoris: Secondary | ICD-10-CM | POA: Diagnosis not present

## 2024-01-24 DIAGNOSIS — J432 Centrilobular emphysema: Secondary | ICD-10-CM | POA: Insufficient documentation

## 2024-01-24 DIAGNOSIS — Z79899 Other long term (current) drug therapy: Secondary | ICD-10-CM | POA: Diagnosis not present

## 2024-01-24 DIAGNOSIS — I700 Atherosclerosis of aorta: Secondary | ICD-10-CM | POA: Diagnosis not present

## 2024-01-24 DIAGNOSIS — K760 Fatty (change of) liver, not elsewhere classified: Secondary | ICD-10-CM | POA: Insufficient documentation

## 2024-01-24 DIAGNOSIS — I7121 Aneurysm of the ascending aorta, without rupture: Secondary | ICD-10-CM | POA: Insufficient documentation

## 2024-01-24 DIAGNOSIS — C3490 Malignant neoplasm of unspecified part of unspecified bronchus or lung: Secondary | ICD-10-CM

## 2024-01-24 DIAGNOSIS — C3432 Malignant neoplasm of lower lobe, left bronchus or lung: Secondary | ICD-10-CM | POA: Diagnosis not present

## 2024-01-24 LAB — CBC WITH DIFFERENTIAL (CANCER CENTER ONLY)
Abs Immature Granulocytes: 0.03 K/uL (ref 0.00–0.07)
Basophils Absolute: 0.1 K/uL (ref 0.0–0.1)
Basophils Relative: 1 %
Eosinophils Absolute: 0.1 K/uL (ref 0.0–0.5)
Eosinophils Relative: 1 %
HCT: 45.5 % (ref 36.0–46.0)
Hemoglobin: 15.4 g/dL — ABNORMAL HIGH (ref 12.0–15.0)
Immature Granulocytes: 0 %
Lymphocytes Relative: 24 %
Lymphs Abs: 2.4 K/uL (ref 0.7–4.0)
MCH: 33.4 pg (ref 26.0–34.0)
MCHC: 33.8 g/dL (ref 30.0–36.0)
MCV: 98.7 fL (ref 80.0–100.0)
Monocytes Absolute: 0.7 K/uL (ref 0.1–1.0)
Monocytes Relative: 7 %
Neutro Abs: 6.8 K/uL (ref 1.7–7.7)
Neutrophils Relative %: 67 %
Platelet Count: 236 K/uL (ref 150–400)
RBC: 4.61 MIL/uL (ref 3.87–5.11)
RDW: 13.6 % (ref 11.5–15.5)
WBC Count: 10.1 K/uL (ref 4.0–10.5)
nRBC: 0 % (ref 0.0–0.2)

## 2024-01-24 LAB — CMP (CANCER CENTER ONLY)
ALT: 18 U/L (ref 0–44)
AST: 31 U/L (ref 15–41)
Albumin: 4 g/dL (ref 3.5–5.0)
Alkaline Phosphatase: 77 U/L (ref 38–126)
Anion gap: 8 (ref 5–15)
BUN: 8 mg/dL (ref 8–23)
CO2: 32 mmol/L (ref 22–32)
Calcium: 9.3 mg/dL (ref 8.9–10.3)
Chloride: 97 mmol/L — ABNORMAL LOW (ref 98–111)
Creatinine: 0.51 mg/dL (ref 0.44–1.00)
GFR, Estimated: >60 mL/min (ref >60)
Glucose, Bld: 99 mg/dL (ref 70–99)
Potassium: 3.5 mmol/L (ref 3.5–5.1)
Sodium: 137 mmol/L (ref 135–145)
Total Bilirubin: 0.8 mg/dL (ref 0.0–1.2)
Total Protein: 6.5 g/dL (ref 6.5–8.1)

## 2024-01-31 ENCOUNTER — Inpatient Hospital Stay: Payer: Medicare PPO | Attending: Internal Medicine | Admitting: Internal Medicine

## 2024-01-31 VITALS — BP 112/68 | HR 86 | Temp 97.7°F | Resp 17 | Ht 64.0 in | Wt 112.0 lb

## 2024-01-31 DIAGNOSIS — C3490 Malignant neoplasm of unspecified part of unspecified bronchus or lung: Secondary | ICD-10-CM | POA: Diagnosis not present

## 2024-01-31 DIAGNOSIS — Z902 Acquired absence of lung [part of]: Secondary | ICD-10-CM | POA: Insufficient documentation

## 2024-01-31 DIAGNOSIS — C3432 Malignant neoplasm of lower lobe, left bronchus or lung: Secondary | ICD-10-CM | POA: Diagnosis not present

## 2024-01-31 DIAGNOSIS — H353132 Nonexudative age-related macular degeneration, bilateral, intermediate dry stage: Secondary | ICD-10-CM | POA: Diagnosis not present

## 2024-01-31 NOTE — Progress Notes (Signed)
 [facility] [phone]   [phone]  OFFICE PROGRESS NOTE  Melanie Ronal Czar, FNP [address] [place] [address]  DIAGNOSIS: Stage IA (T1b, N0, M0) non-small cell lung cancer, adenocarcinoma presented with right upper lobe nodule   PRIOR THERAPY: Status post right upper lobectomy with lymph node dissection under the care of Dr. Kerrin on May 28, 2019.   CURRENT THERAPY: Observation  INTERVAL HISTORY: [name] 73 y.o. female returns to the clinic today for annual follow-up visit.Discussed the use of AI scribe software for clinical note transcription with the patient, who gave verbal consent to proceed.  History of Present Illness [name] is a 73 year old female with stage 1A adenocarcinoma of the lung who presents for evaluation with repeat CT scan for restaging of her disease.  She has a history of stage 1A adenocarcinoma of the lung and underwent a right upper lobectomy with lymph node dissection in February 2021. Since then, she has been under observation and is here for a repeat CT scan to restage her disease. A recent CT scan revealed a 1.0 by 0.7 cm nodule in the superior segment of the left lower lobe, opposite the site of her previous surgery. Further evaluation is needed to confirm its nature.  Over the past four to five months, she has experienced difficulty walking distances and balance issues, particularly when climbing steps, requiring assistance from her grandchildren. She reports a decline in her quality of life, stating 'a little quality of life sucks a little bit.'  She has a persistent cough but no hemoptysis. She has not had any recent infections such as bronchitis, COVID-19, or the flu. She has a history of colitis, which she states is 'part of my life,' and experiences diarrhea.  She mentions a past experience of having a spot on her lung that was monitored for fifteen years, but  acknowledges that this situation is different. She also has a cyst in her left kidney, which she is not concerned about.    MEDICAL HISTORY: Past Medical History:  Diagnosis Date   Active smoker    per pt quit 01/ 2021 smoking prior to lung lobectomy 02/ 2021 but started back smoking 06/ 2022 stated 5 cig per day   Asthma    Chronic diarrhea    Complication of anesthesia    hard to wake and ponv   Dyslipidemia    Emphysema/COPD    pulmologist--- dr kara;   no oxygen   Endometriosis    Family history of adverse reaction to anesthesia    brother--- slow to wake   Finger fracture, left    left ring and small proximal phalagel fx's   GAD (generalized anxiety disorder)    GERD (gastroesophageal reflux disease)    Hiatal hernia    History of cervical dysplasia    s/p gyn crysurgery yrs ago   History of gastric ulcer 05/2019   post lung lobectomy w/ esophagitis   History of TIA (transient ischemic attack)    12-22-2021  per pt remote TIA > 10 yrs ago,  no residual   Hypertension    followed by cardiologist and pcp;   cardiac cath 11-20-2002 completely normal;   last nuclear stress test 08-14-2010 low risk normal perfusion no ischemia, nuclear ef 59%   Intermittent palpitations    followed by dr hilty   Lymphocytic colitis    followed by [facility]   Macular degeneration  of both eyes    Migraine    Non-small cell lung cancer, right (HCC) 05/2019   surgeon-- dr kerrin / oncologist-- dr ozzie  pulmologist-- dr j. kara;   05-28-2019  s/p  right upper lobectomy w/ node dissection's,  Stage IA,  no chemo or radiation   NSVT (nonsustained ventricular tachycardia) (HCC)    hx bigemy  and psot op wide complex tachycardia s/p lung lobectomy 02/ 2021   OSA (obstructive sleep apnea)    does not wear her CPAP because it gave me respiratory issues (bronchities and pneumonia)   Osteoporosis    PONV (postoperative nausea and vomiting)    Secondary polycythemia    evaluation by dr  odean ronco note in epic 11-03-2015  per note due to tobacco use and respiratory issues   Thoracic ascending aortic aneurysm 02/02/2022    ALLERGIES:  is allergic to iodinated contrast media, ioxaglate, omnipaque  [iohexol ], buprenorphine hcl, codeine, erythromycin, morphine and codeine, nicoderm [nicotine ], and oyster extract.  MEDICATIONS:  Current Outpatient Medications  Medication Sig Dispense Refill   ALPRAZolam  (XANAX ) 0.5 MG tablet Take 0.5 mg by mouth 3 (three) times daily as needed for anxiety or sleep.     amLODipine  (NORVASC ) 2.5 MG tablet TAKE 1 TABLET BY MOUTH DAILY. (Patient taking differently: Take 2.5 mg by mouth in the morning and at bedtime.) 180 tablet 1   aspirin  EC 81 MG tablet Take 81 mg by mouth every other day.     bismuth subsalicylate (PEPTO BISMOL) 262 MG chewable tablet Chew 524 mg by mouth as needed for diarrhea or loose stools.     BREO ELLIPTA 100-25 MCG/ACT AEPB 1 puff daily.     budesonide  (ENTOCORT EC ) 3 MG 24 hr capsule Take 3 capsules (9 mg total) by mouth in the morning. (Patient not taking: Reported on 12/16/2023) 90 capsule 12   CALCIUM  PO Take 1 tablet by mouth 2 (two) times a week. (Patient not taking: Reported on 12/16/2023)     cholestyramine  (QUESTRAN ) 4 GM/DOSE powder Take 4 g by mouth daily as needed (for diarrhea).     colestipol (COLESTID) 1 g tablet 2 tab(s) orally 2 times a day for 30 days (Patient not taking: Reported on 12/16/2023)     denosumab  (PROLIA ) 60 MG/ML SOLN injection Inject 60 mg into the skin every 6 (six) months. Administer in upper arm, thigh, or abdomen     ergocalciferol  (VITAMIN D2) 1.25 MG (50000 UT) capsule Take 50,000 Units by mouth every Saturday.     eszopiclone  3 MG TABS Take 1 tablet (3 mg total) by mouth at bedtime as needed (for sleep- Take immediately before bedtime). (Patient not taking: Reported on 12/16/2023)  0   gabapentin  (NEURONTIN ) 300 MG capsule TAKE 2 CAPSULES(600 MG) BY MOUTH THREE TIMES DAILY (Patient not  taking: Reported on 12/16/2023) 180 capsule 2   irbesartan  (AVAPRO ) 150 MG tablet TAKE 1 TABLET(150 MG) BY MOUTH DAILY 90 tablet 3   loperamide  (IMODIUM  A-D) 2 MG tablet Take 2 mg by mouth 4 (four) times daily as needed for diarrhea or loose stools.     metoprolol  succinate (TOPROL -XL) 25 MG 24 hr tablet TAKE 1 TABLET(25 MG) BY MOUTH DAILY 90 tablet 0   montelukast  (SINGULAIR ) 10 MG tablet Take 10 mg by mouth at bedtime.   0   Multiple Vitamins-Minerals (PRESERVISION AREDS 2) CAPS Take 1 capsule by mouth in the morning and at bedtime.     pantoprazole  (PROTONIX ) 40 MG tablet Take 1 tablet (40  mg total) by mouth 2 (two) times daily. (Patient taking differently: Take 40 mg by mouth See admin instructions. Take 40 mg by mouth before supper and once a day as needed for reflux) 60 tablet 3   rosuvastatin  (CRESTOR ) 10 MG tablet Take 1 tablet (10 mg total) by mouth daily. 90 tablet 3   SPIRIVA  RESPIMAT 2.5 MCG/ACT AERS INHALE 2 PUFFS INTO THE LUNGS DAILY (Patient taking differently: Inhale 2 puffs into the lungs at bedtime.) 4 g 0   UNKNOWN TO PATIENT Take 1-2 tablets by mouth See admin instructions. [employer] brand otc sleep aid: Take 1-2 tablets by mouth as needed for sleep     UNKNOWN TO PATIENT Place 1 drop into both eyes See admin instructions. Unnamed OTC eye drops for dryness: Instill 1 drop into both eyes in the morning     VENTOLIN  HFA 108 (90 BASE) MCG/ACT inhaler Inhale 2 puffs into the lungs every 6 (six) hours as needed for wheezing or shortness of breath.     XIFAXAN 550 MG TABS tablet Take 550 mg by mouth 3 (three) times daily. (Patient not taking: Reported on 12/16/2023)     No current facility-administered medications for this visit.    SURGICAL HISTORY:  Past Surgical History:  Procedure Laterality Date   BIOPSY  06/05/2019   Procedure: BIOPSY;  Surgeon: Rollin Dover, MD;  Location: [facility] ENDOSCOPY;  Service: Endoscopy;;   CARDIAC CATHETERIZATION  11/20/2002   @[facility]  by dr gamble;    completey normal;  patent coronary arteries   CATARACT EXTRACTION W/ INTRAOCULAR LENS IMPLANT Bilateral 08/2020   ESOPHAGOGASTRODUODENOSCOPY Left 06/05/2019   Procedure: ESOPHAGOGASTRODUODENOSCOPY (EGD);  Surgeon: Rollin Dover, MD;  Location: [facility] ENDOSCOPY;  Service: Endoscopy;  Laterality: Left;   GYNECOLOGIC CRYOSURGERY     many yrs ago   INTERCOSTAL NERVE BLOCK Right 05/28/2019   Procedure: Intercostal Nerve Block;  Surgeon: Kerrin Elspeth BROCKS, MD;  Location: [facility] OR;  Service: Thoracic;  Laterality: Right;   LAPAROSCOPIC CHOLECYSTECTOMY  10/15/2002   @[facility]    LUMBAR DISC SURGERY  08/15/2000   @[facility]  by dr alix;   left L3--4   (and also lumbar surgery approx. 1989)   NASAL SINUS SURGERY  2004   approx   NODE DISSECTION  05/28/2019   Procedure: Node Dissection;  Surgeon: Kerrin Elspeth BROCKS, MD;  Location: [facility] OR;  Service: Thoracic;;   OPEN REDUCTION INTERNAL FIXATION (ORIF) PROXIMAL PHALANX Left 12/29/2021   Procedure: Left ring and small finger proximal phalangeal closed reduction percutaneous pinning;  Surgeon: Alyse Agent, MD;  Location: [facility] ;  Service: Orthopedics;  Laterality: Left;   ROTATOR CUFF REPAIR Left    x2  last one  1990s   THORACOSCOPY  05/28/2019   THORASCOPY-WEDGE RESECTION AND  RIGHT  UPPER LOBECTOMY (Right)   THROAT SURGERY  1997   REMOVAL OF TUMOR  (PER PT BENIGN)   TONSILLECTOMY     age 41   VAGINAL HYSTERECTOMY  2000   w/  BILATERAL SALPINGOOPHORECTOMY    REVIEW OF SYSTEMS:  Constitutional: positive for fatigue Eyes: negative Ears, nose, mouth, throat, and face: negative Respiratory: positive for cough Cardiovascular: negative Gastrointestinal: negative Genitourinary:negative Integument/breast: negative Hematologic/lymphatic: negative Musculoskeletal:negative Neurological: negative Behavioral/Psych: negative Endocrine: negative Allergic/Immunologic: negative   PHYSICAL EXAMINATION: General appearance: alert, cooperative,  fatigued, and no distress Head: Normocephalic, without obvious abnormality, atraumatic Neck: no adenopathy, no JVD, supple, symmetrical, trachea midline, and thyroid  not enlarged, symmetric, no tenderness/mass/nodules Lymph nodes: Cervical, supraclavicular, and axillary nodes normal. Resp: clear  to auscultation bilaterally Back: symmetric, no curvature. ROM normal. No CVA tenderness. Cardio: regular rate and rhythm, S1, S2 normal, no murmur, click, rub or gallop GI: soft, non-tender; bowel sounds normal; no masses,  no organomegaly Extremities: extremities normal, atraumatic, no cyanosis or edema Neurologic: Alert and oriented X 3, normal strength and tone. Normal symmetric reflexes. Normal coordination and gait  ECOG PERFORMANCE STATUS: 1 - Symptomatic but completely ambulatory  Blood pressure 112/68, pulse 86, temperature 97.7 F (36.5 C), resp. rate 17, height 5' 4 (1.626 m), weight 112 lb (50.8 kg), SpO2 96%.  LABORATORY DATA: Lab Results  Component Value Date   WBC 10.1 01/24/2024   HGB 15.4 (H) 01/24/2024   HCT 45.5 01/24/2024   MCV 98.7 01/24/2024   PLT 236 01/24/2024      Chemistry      Component Value Date/Time   NA 137 01/24/2024 1523   NA 145 (H) 03/30/2023 1552   K 3.5 01/24/2024 1523   CL 97 (L) 01/24/2024 1523   CO2 32 01/24/2024 1523   BUN 8 01/24/2024 1523   BUN 5 (L) 03/30/2023 1552   CREATININE 0.51 01/24/2024 1523      Component Value Date/Time   CALCIUM  9.3 01/24/2024 1523   ALKPHOS 77 01/24/2024 1523   AST 31 01/24/2024 1523   ALT 18 01/24/2024 1523   BILITOT 0.8 01/24/2024 1523       RADIOGRAPHIC STUDIES: CT Chest Wo Contrast Result Date: 01/30/2024 CLINICAL DATA:  Non-small cell lung cancer staging. Follow-up ascending aortic aneurysm. EXAM: CT CHEST WITHOUT CONTRAST TECHNIQUE: Multidetector CT imaging of the chest was performed following the standard protocol without IV contrast. RADIATION DOSE REDUCTION: This exam was performed according to  the departmental dose-optimization program which includes automated exposure control, adjustment of the mA and/or kV according to patient size and/or use of iterative reconstruction technique. COMPARISON:  Chest CTs without contrast 01/31/2023, 01/25/2022. FINDINGS: Cardiovascular: There are three-vessel coronary artery calcifications heaviest in the LAD and right coronary arteries. There is no pericardial effusion. The cardiac size is normal. There is a chronically prominent pulmonary trunk 3.2 cm. The pulmonary veins are normal caliber. The thoracic aorta and great vessels are heavily calcified. There is likely a calcific flow-limiting stenosis in the proximal left subclavian artery Maximum aortic caliber in the mid ascending segment measuring 4.1 cm transverse on 7:41, 4.3 cm AP on 8:96. The remainder is within normal caliber limits with mild tortuosity. Mediastinum/Nodes: No enlarged mediastinal or axillary lymph nodes. Thyroid  gland, trachea, and esophagus demonstrate no significant findings. Lungs/Pleura: Advanced centrilobular emphysema. Remote right upper lobectomy with scarring change and compensatory right lower lobe hyperexpansion. There is a 1.0 x 0.7 cm enlarging solid nodule with slight spiculation in the superior segment of the left lower lobe on 6:79. I can identify this only in retrospect on the prior study because it abuts a vessel. It measured 6 x 5 mm last year. I do not see this in 2023. Findings are highly worrisome for bronchogenic malignancy. Consider PET-CT for further evaluation or tissue sampling at this time. A 3 mm calcified granuloma is again noted anteriorly in the left upper lobe on 6:65. There are no further nodules. There is no consolidation, effusion or pneumothorax. Upper Abdomen: Heterogeneous hepatic steatosis. Status post cholecystectomy. Chronic extrahepatic biliary dilatation. Visualized portions of the common bile duct measures 1.8 cm, unchanged. There is a Bosniak 1 cyst  anteriorly in the left kidney measuring 2.7 cm, 6.2 Hounsfield units. No acute upper abdominal abnormality.  There is abdominal aortic atherosclerosis. Musculoskeletal: No regional bone metastasis. Numerous dystrophic calcifications both breasts. IMPRESSION: 1. 1.0 x 0.7 cm enlarging solid nodule with slight spiculation in the superior segment of the left lower lobe. Findings are highly worrisome for bronchogenic malignancy. Recommend PET-CT or tissue sampling. 2. Advanced centrilobular emphysema.  Remote right upper lobectomy. 3. Aortic and coronary artery atherosclerosis. 4. 4.1 x 4.3 cm ascending aortic aneurysm. Continued annual follow-up recommended. This recommendation follows 2010 ACCF/AHA/AATS/ACR/ASA/SCA/SCAI/SIR/STS/SVM Guidelines for the Diagnosis and Management of Patients with Thoracic Aortic Disease. Circulation.2010; 121: Z733-z630. Aortic aneurysm NOS (ICD10-I71.9). 5. Prominent pulmonary trunk 3.2 cm, unchanged. 6. Hepatic steatosis. 7. Chronic extrahepatic postcholecystectomy biliary dilatation. 8. These results will be called to the ordering clinician or representative by the Radiologist Assistant, and communication documented in the PACS or [employer]. Aortic Atherosclerosis (ICD10-I70.0) and Emphysema (ICD10-J43.9). Electronically Signed   By: Francis Quam [name]   On: 01/30/2024 01:54     ASSESSMENT AND PLAN: This is a very pleasant 73 years old white female with a stage IA (T1b, N0, M0) non-small cell lung cancer, adenocarcinoma presented with right upper lobe lung nodule status post right lower lobectomy with lymph node dissection under the care of Dr. Kerrin.   The patient has been on observation since 2021 and she is feeling fine with no concerning complaints. She had repeat CT scan of the chest performed recently.  I personally independently reviewed the scan images and discussed the result and showed the images to the patient today.  Her scan showed no concerning finding  for disease recurrence but there was enlarging 1.0 x 0.7 cm solid nodule with slight spiculation in the superior segment of the left lower lobe worrisome for bronchogenic malignancy. Assessment and Plan Assessment & Plan Pulmonary nodule in left lower lobe, suspicious for malignancy A 1.0 x 0.7 cm nodule in the superior segment of the left lower lobe was identified on the recent CT scan. The nodule is suspicious for malignancy, but differential diagnosis includes a mucus plug or infection. - Order PET scan to evaluate the nodule in the left lower lobe - If PET scan shows increased uptake, refer to pulmonologist for biopsy - If PET scan is not suspicious, continue surveillance with periodic scans  Surveillance after right upper lobectomy for stage 1A lung adenocarcinoma Status post right upper lobectomy with lymph node dissection in February 2021 for stage 1A lung adenocarcinoma. Current surveillance includes monitoring for recurrence or new malignancies. - Continue surveillance with periodic imaging studies  Do not resuscitate status She expressed a desire to have a do not resuscitate (DNR) order in place. She has paperwork for her son and wishes to have this documented in her medical records. - Provide DNR form for completion and scan into medical records The patient was advised to call immediately if she has any other concerning symptoms in the interval. The total time spent in the appointment was 30 minutes including review of chart and various tests results, discussions about plan of care and coordination of care plan .  The patient voices understanding of current disease status and treatment options and is in agreement with the current care plan.  All questions were answered. The patient knows to call the clinic with any problems, questions or concerns. We can certainly see the patient much sooner if necessary.  Disclaimer: This note was dictated with voice recognition software. Similar  sounding words can inadvertently be transcribed and may not be corrected upon review.

## 2024-02-07 ENCOUNTER — Encounter (HOSPITAL_COMMUNITY)
Admission: RE | Admit: 2024-02-07 | Discharge: 2024-02-07 | Disposition: A | Discharge status: Home / Self Care | Source: Ambulatory Visit | Attending: Internal Medicine | Admitting: Internal Medicine

## 2024-02-07 DIAGNOSIS — I700 Atherosclerosis of aorta: Secondary | ICD-10-CM | POA: Insufficient documentation

## 2024-02-07 DIAGNOSIS — C3490 Malignant neoplasm of unspecified part of unspecified bronchus or lung: Secondary | ICD-10-CM | POA: Diagnosis not present

## 2024-02-07 DIAGNOSIS — R911 Solitary pulmonary nodule: Secondary | ICD-10-CM | POA: Insufficient documentation

## 2024-02-07 DIAGNOSIS — C3432 Malignant neoplasm of lower lobe, left bronchus or lung: Secondary | ICD-10-CM | POA: Diagnosis not present

## 2024-02-07 DIAGNOSIS — J439 Emphysema, unspecified: Secondary | ICD-10-CM | POA: Diagnosis not present

## 2024-02-07 LAB — GLUCOSE, CAPILLARY: Glucose-Capillary: 114 mg/dL — ABNORMAL HIGH (ref 70–99)

## 2024-02-07 MED ORDER — FLUDEOXYGLUCOSE F - 18 (FDG) INJECTION
5.5800 | Freq: Once | INTRAVENOUS | Status: AC | PRN
  Administered 2024-02-07: 5.58 via INTRAVENOUS

## 2024-02-13 DIAGNOSIS — Z1231 Encounter for screening mammogram for malignant neoplasm of breast: Secondary | ICD-10-CM | POA: Diagnosis not present

## 2024-02-14 ENCOUNTER — Encounter: Payer: Self-pay | Admitting: Internal Medicine

## 2024-02-22 ENCOUNTER — Encounter: Payer: Self-pay | Admitting: Internal Medicine

## 2024-02-23 ENCOUNTER — Inpatient Hospital Stay: Admitting: Internal Medicine

## 2024-02-23 VITALS — BP 123/83 | HR 70 | Temp 97.0°F | Resp 17 | Ht 64.0 in | Wt 113.0 lb

## 2024-02-23 DIAGNOSIS — C3432 Malignant neoplasm of lower lobe, left bronchus or lung: Secondary | ICD-10-CM | POA: Diagnosis not present

## 2024-02-23 DIAGNOSIS — C3491 Malignant neoplasm of unspecified part of right bronchus or lung: Secondary | ICD-10-CM | POA: Diagnosis not present

## 2024-02-23 DIAGNOSIS — C3490 Malignant neoplasm of unspecified part of unspecified bronchus or lung: Secondary | ICD-10-CM | POA: Diagnosis not present

## 2024-02-23 DIAGNOSIS — Z902 Acquired absence of lung [part of]: Secondary | ICD-10-CM | POA: Diagnosis not present

## 2024-02-23 NOTE — Progress Notes (Signed)
 [facility] [phone]   [phone]  OFFICE PROGRESS NOTE  Melanie Ronal Czar, FNP [address] [place] [address]  DIAGNOSIS:  1) Stage IA (T1b, N0, M0) non-small cell lung cancer, adenocarcinoma presented with right upper lobe nodule  2) left lower lobe lung nodule suspicious for lung cancer diagnosed in September 2025.  PRIOR THERAPY: Status post right upper lobectomy with lymph node dissection under the care of Dr. Kerrin on May 28, 2019.   CURRENT THERAPY: Observation  INTERVAL HISTORY: [name] 73 y.o. female returns to the clinic today for annual follow-up visit accompanied by her son.Discussed the use of AI scribe software for clinical note transcription with the patient, who gave verbal consent to proceed.  History of Present Illness [name] is a 73 year old female with stage 1a nonsmall cell lung cancer who presents for evaluation of a hypermetabolic pulmonary nodule.  She has a history of stage 1a nonsmall cell lung cancer, adenocarcinoma, for which she underwent a right upper lobectomy with lymph node dissection in February 2021. Since then, she has been under observation.  A recent CT scan of the chest revealed a 10 mm retrohilar left lower lobe pulmonary nodule. A subsequent PET scan showed this nodule to be hypermetabolic. She noted that the current nodule is smaller than the previous one, which was 13 mm. She is reluctant to undergo another surgical procedure, referencing her previous experience.  She has been experiencing some constipation recently and has a history of colitis.    MEDICAL HISTORY: Past Medical History:  Diagnosis Date   Active smoker    per pt quit 01/ 2021 smoking prior to lung lobectomy 02/ 2021 but started back smoking 06/ 2022 stated 5 cig per day   Asthma    Chronic diarrhea    Complication of anesthesia    hard to wake and ponv   Dyslipidemia     Emphysema/COPD    pulmologist--- dr kara;   no oxygen   Endometriosis    Family history of adverse reaction to anesthesia    brother--- slow to wake   Finger fracture, left    left ring and small proximal phalagel fx's   GAD (generalized anxiety disorder)    GERD (gastroesophageal reflux disease)    Hiatal hernia    History of cervical dysplasia    s/p gyn crysurgery yrs ago   History of gastric ulcer 05/2019   post lung lobectomy w/ esophagitis   History of TIA (transient ischemic attack)    12-22-2021  per pt remote TIA > 10 yrs ago,  no residual   Hypertension    followed by cardiologist and pcp;   cardiac cath 11-20-2002 completely normal;   last nuclear stress test 08-14-2010 low risk normal perfusion no ischemia, nuclear ef 59%   Intermittent palpitations    followed by dr hilty   Lymphocytic colitis    followed by [facility]   Macular degeneration of both eyes    Migraine    Non-small cell lung cancer, right (HCC) 05/2019   surgeon-- dr kerrin / oncologist-- dr ozzie  pulmologist-- dr j. kara;   05-28-2019  s/p  right upper lobectomy w/ node dissection's,  Stage IA,  no chemo or radiation   NSVT (nonsustained ventricular tachycardia) (HCC)    hx bigemy  and psot op wide complex tachycardia s/p lung lobectomy 02/ 2021   OSA (obstructive sleep apnea)  does not wear her CPAP because it gave me respiratory issues (bronchities and pneumonia)   Osteoporosis    PONV (postoperative nausea and vomiting)    Secondary polycythemia    evaluation by dr odean ronco note in epic 11-03-2015  per note due to tobacco use and respiratory issues   Thoracic ascending aortic aneurysm 02/02/2022    ALLERGIES:  is allergic to iodinated contrast media, ioxaglate, omnipaque  [iohexol ], buprenorphine hcl, codeine, erythromycin, morphine and codeine, nicoderm [nicotine ], and oyster extract.  MEDICATIONS:  Current Outpatient Medications  Medication Sig Dispense Refill   ALPRAZolam   (XANAX ) 0.5 MG tablet Take 0.5 mg by mouth 3 (three) times daily as needed for anxiety or sleep.     amLODipine  (NORVASC ) 2.5 MG tablet TAKE 1 TABLET BY MOUTH DAILY. (Patient taking differently: Take 2.5 mg by mouth in the morning and at bedtime.) 180 tablet 1   aspirin  EC 81 MG tablet Take 81 mg by mouth every other day.     bismuth subsalicylate (PEPTO BISMOL) 262 MG chewable tablet Chew 524 mg by mouth as needed for diarrhea or loose stools.     BREO ELLIPTA 100-25 MCG/ACT AEPB 1 puff daily.     budesonide  (ENTOCORT EC ) 3 MG 24 hr capsule Take 3 capsules (9 mg total) by mouth in the morning. 90 capsule 12   CALCIUM  PO Take 1 tablet by mouth 2 (two) times a week.     cholestyramine  (QUESTRAN ) 4 GM/DOSE powder Take 4 g by mouth daily as needed (for diarrhea).     colestipol (COLESTID) 1 g tablet 2 tab(s) orally 2 times a day for 30 days     denosumab  (PROLIA ) 60 MG/ML SOLN injection Inject 60 mg into the skin every 6 (six) months. Administer in upper arm, thigh, or abdomen     ergocalciferol  (VITAMIN D2) 1.25 MG (50000 UT) capsule Take 50,000 Units by mouth every Saturday.     eszopiclone  3 MG TABS Take 1 tablet (3 mg total) by mouth at bedtime as needed (for sleep- Take immediately before bedtime).  0   gabapentin  (NEURONTIN ) 300 MG capsule TAKE 2 CAPSULES(600 MG) BY MOUTH THREE TIMES DAILY 180 capsule 2   irbesartan  (AVAPRO ) 150 MG tablet TAKE 1 TABLET(150 MG) BY MOUTH DAILY 90 tablet 3   loperamide  (IMODIUM  A-D) 2 MG tablet Take 2 mg by mouth 4 (four) times daily as needed for diarrhea or loose stools.     metoprolol  succinate (TOPROL -XL) 25 MG 24 hr tablet TAKE 1 TABLET(25 MG) BY MOUTH DAILY 90 tablet 0   montelukast  (SINGULAIR ) 10 MG tablet Take 10 mg by mouth at bedtime.   0   Multiple Vitamins-Minerals (PRESERVISION AREDS 2) CAPS Take 1 capsule by mouth in the morning and at bedtime.     pantoprazole  (PROTONIX ) 40 MG tablet Take 1 tablet (40 mg total) by mouth 2 (two) times daily. (Patient  taking differently: Take 40 mg by mouth See admin instructions. Take 40 mg by mouth before supper and once a day as needed for reflux) 60 tablet 3   rosuvastatin  (CRESTOR ) 10 MG tablet Take 1 tablet (10 mg total) by mouth daily. 90 tablet 3   SPIRIVA  RESPIMAT 2.5 MCG/ACT AERS INHALE 2 PUFFS INTO THE LUNGS DAILY (Patient taking differently: Inhale 2 puffs into the lungs at bedtime.) 4 g 0   UNKNOWN TO PATIENT Take 1-2 tablets by mouth See admin instructions. [employer] brand otc sleep aid: Take 1-2 tablets by mouth as needed for sleep  UNKNOWN TO PATIENT Place 1 drop into both eyes See admin instructions. Unnamed OTC eye drops for dryness: Instill 1 drop into both eyes in the morning     VENTOLIN  HFA 108 (90 BASE) MCG/ACT inhaler Inhale 2 puffs into the lungs every 6 (six) hours as needed for wheezing or shortness of breath.     XIFAXAN 550 MG TABS tablet Take 550 mg by mouth 3 (three) times daily.     No current facility-administered medications for this visit.    SURGICAL HISTORY:  Past Surgical History:  Procedure Laterality Date   BIOPSY  06/05/2019   Procedure: BIOPSY;  Surgeon: Rollin Dover, MD;  Location: [facility];  Service: Endoscopy;;   CARDIAC CATHETERIZATION  11/20/2002   @[facility]  by dr gamble;   completey normal;  patent coronary arteries   CATARACT EXTRACTION W/ INTRAOCULAR LENS IMPLANT Bilateral 08/2020   ESOPHAGOGASTRODUODENOSCOPY Left 06/05/2019   Procedure: ESOPHAGOGASTRODUODENOSCOPY (EGD);  Surgeon: Rollin Dover, MD;  Location: [facility];  Service: Endoscopy;  Laterality: Left;   GYNECOLOGIC CRYOSURGERY     many yrs ago   INTERCOSTAL NERVE BLOCK Right 05/28/2019   Procedure: Intercostal Nerve Block;  Surgeon: Kerrin Elspeth BROCKS, MD;  Location: [facility] OR;  Service: Thoracic;  Laterality: Right;   LAPAROSCOPIC CHOLECYSTECTOMY  10/15/2002   @[facility]    LUMBAR DISC SURGERY  08/15/2000   @[facility]  by dr alix;   left L3--4   (and also lumbar surgery approx. 1989)   NASAL SINUS  SURGERY  2004   approx   NODE DISSECTION  05/28/2019   Procedure: Node Dissection;  Surgeon: Kerrin Elspeth BROCKS, MD;  Location: [facility] OR;  Service: Thoracic;;   OPEN REDUCTION INTERNAL FIXATION (ORIF) PROXIMAL PHALANX Left 12/29/2021   Procedure: Left ring and small finger proximal phalangeal closed reduction percutaneous pinning;  Surgeon: Alyse Agent, MD;  Location: [facility];  Service: Orthopedics;  Laterality: Left;   ROTATOR CUFF REPAIR Left    x2  last one  1990s   THORACOSCOPY  05/28/2019   THORASCOPY-WEDGE RESECTION AND  RIGHT  UPPER LOBECTOMY (Right)   THROAT SURGERY  1997   REMOVAL OF TUMOR  (PER PT BENIGN)   TONSILLECTOMY     age 5   VAGINAL HYSTERECTOMY  2000   w/  BILATERAL SALPINGOOPHORECTOMY    REVIEW OF SYSTEMS:  Constitutional: positive for fatigue Eyes: negative Ears, nose, mouth, throat, and face: negative Respiratory: positive for cough Cardiovascular: negative Gastrointestinal: negative Genitourinary:negative Integument/breast: negative Hematologic/lymphatic: negative Musculoskeletal:negative Neurological: negative Behavioral/Psych: negative Endocrine: negative Allergic/Immunologic: negative   PHYSICAL EXAMINATION: General appearance: alert, cooperative, fatigued, and no distress Head: Normocephalic, without obvious abnormality, atraumatic Neck: no adenopathy, no JVD, supple, symmetrical, trachea midline, and thyroid  not enlarged, symmetric, no tenderness/mass/nodules Lymph nodes: Cervical, supraclavicular, and axillary nodes normal. Resp: clear to auscultation bilaterally Back: symmetric, no curvature. ROM normal. No CVA tenderness. Cardio: regular rate and rhythm, S1, S2 normal, no murmur, click, rub or gallop GI: soft, non-tender; bowel sounds normal; no masses,  no organomegaly Extremities: extremities normal, atraumatic, no cyanosis or edema Neurologic: Alert and oriented X 3, normal strength and tone. Normal symmetric reflexes.  Normal coordination and gait  ECOG PERFORMANCE STATUS: 1 - Symptomatic but completely ambulatory  Blood pressure 123/83, pulse 70, temperature (!) 97 F (36.1 C), temperature source Temporal, resp. rate 17, height 5' 4 (1.626 m), weight 113 lb (51.3 kg), SpO2 96%.  LABORATORY DATA: Lab Results  Component Value Date   WBC 10.1 01/24/2024   HGB 15.4 (H)  01/24/2024   HCT 45.5 01/24/2024   MCV 98.7 01/24/2024   PLT 236 01/24/2024      Chemistry      Component Value Date/Time   NA 137 01/24/2024 1523   NA 145 (H) 03/30/2023 1552   K 3.5 01/24/2024 1523   CL 97 (L) 01/24/2024 1523   CO2 32 01/24/2024 1523   BUN 8 01/24/2024 1523   BUN 5 (L) 03/30/2023 1552   CREATININE 0.51 01/24/2024 1523      Component Value Date/Time   CALCIUM  9.3 01/24/2024 1523   ALKPHOS 77 01/24/2024 1523   AST 31 01/24/2024 1523   ALT 18 01/24/2024 1523   BILITOT 0.8 01/24/2024 1523       RADIOGRAPHIC STUDIES: NM PET Image Restage (PS) Skull Base to Thigh (F-18 FDG) Result Date: 02/09/2024 CLINICAL DATA:  Subsequent treatment strategy for non-small cell lung cancer. EXAM: NUCLEAR MEDICINE PET SKULL BASE TO THIGH TECHNIQUE: 5.6 mCi F-18 FDG was injected intravenously. Full-ring PET imaging was performed from the skull base to thigh after the radiotracer. CT data was obtained and used for attenuation correction and anatomic localization. Fasting blood glucose: 114 mg/dl COMPARISON:  Chest CT 90/69/7974.  PET-CT 05/08/2019 FINDINGS: Mediastinal blood pool activity: SUV max 2.6 Liver activity: SUV max NA NECK: No hypermetabolic lymph nodes in the neck. Incidental CT findings: None. CHEST: 10 mm retro hilar left lower lobe pulmonary nodule of concern on recent chest CT is hypermetabolic with SUV max = 3.4. No other suspicious hypermetabolic pulmonary nodule or mass. No hypermetabolic lymphadenopathy in the mediastinum or left hilum. Incidental CT findings: Coronary artery calcification is evident. Moderate  atherosclerotic calcification noted in the thoracic aorta. 4.3 cm ascending thoracic aortic aneurysm documented on recent diagnostic chest CT. Centrilobular and paraseptal emphysema evident. Surgical scarring noted parahilar right lung. ABDOMEN/PELVIS: No abnormal hypermetabolic activity within the liver, pancreas, adrenal glands, or spleen. No hypermetabolic lymph nodes in the abdomen or pelvis. Incidental CT findings: Cholecystectomy. Extrahepatic biliary duct dilatation similar to abdomen/pelvis CT 07/28/2022. Small cyst noted interpolar left kidney. Abdominal aortic atherosclerosis. SKELETON: No focal hypermetabolic activity to suggest skeletal metastasis. Incidental CT findings: No worrisome lytic or sclerotic osseous abnormality. IMPRESSION: 1. 10 mm retro hilar left lower lobe pulmonary nodule of concern on recent chest CT is hypermetabolic, consistent with metastatic disease or new primary. 2. No evidence for hypermetabolic metastatic disease in the neck, abdomen, or pelvis. 3. Aortic Atherosclerosis (ICD10-I70.0) and Emphysema (ICD10-J43.9). Electronically Signed   By: Camellia Candle [name]   On: 02/09/2024 05:44   CT Chest Wo Contrast Result Date: 01/30/2024 CLINICAL DATA:  Non-small cell lung cancer staging. Follow-up ascending aortic aneurysm. EXAM: CT CHEST WITHOUT CONTRAST TECHNIQUE: Multidetector CT imaging of the chest was performed following the standard protocol without IV contrast. RADIATION DOSE REDUCTION: This exam was performed according to the departmental dose-optimization program which includes automated exposure control, adjustment of the mA and/or kV according to patient size and/or use of iterative reconstruction technique. COMPARISON:  Chest CTs without contrast 01/31/2023, 01/25/2022. FINDINGS: Cardiovascular: There are three-vessel coronary artery calcifications heaviest in the LAD and right coronary arteries. There is no pericardial effusion. The cardiac size is normal. There is a  chronically prominent pulmonary trunk 3.2 cm. The pulmonary veins are normal caliber. The thoracic aorta and great vessels are heavily calcified. There is likely a calcific flow-limiting stenosis in the proximal left subclavian artery Maximum aortic caliber in the mid ascending segment measuring 4.1 cm transverse on 7:41, 4.3 cm AP  on 8:96. The remainder is within normal caliber limits with mild tortuosity. Mediastinum/Nodes: No enlarged mediastinal or axillary lymph nodes. Thyroid  gland, trachea, and esophagus demonstrate no significant findings. Lungs/Pleura: Advanced centrilobular emphysema. Remote right upper lobectomy with scarring change and compensatory right lower lobe hyperexpansion. There is a 1.0 x 0.7 cm enlarging solid nodule with slight spiculation in the superior segment of the left lower lobe on 6:79. I can identify this only in retrospect on the prior study because it abuts a vessel. It measured 6 x 5 mm last year. I do not see this in 2023. Findings are highly worrisome for bronchogenic malignancy. Consider PET-CT for further evaluation or tissue sampling at this time. A 3 mm calcified granuloma is again noted anteriorly in the left upper lobe on 6:65. There are no further nodules. There is no consolidation, effusion or pneumothorax. Upper Abdomen: Heterogeneous hepatic steatosis. Status post cholecystectomy. Chronic extrahepatic biliary dilatation. Visualized portions of the common bile duct measures 1.8 cm, unchanged. There is a Bosniak 1 cyst anteriorly in the left kidney measuring 2.7 cm, 6.2 Hounsfield units. No acute upper abdominal abnormality. There is abdominal aortic atherosclerosis. Musculoskeletal: No regional bone metastasis. Numerous dystrophic calcifications both breasts. IMPRESSION: 1. 1.0 x 0.7 cm enlarging solid nodule with slight spiculation in the superior segment of the left lower lobe. Findings are highly worrisome for bronchogenic malignancy. Recommend PET-CT or tissue  sampling. 2. Advanced centrilobular emphysema.  Remote right upper lobectomy. 3. Aortic and coronary artery atherosclerosis. 4. 4.1 x 4.3 cm ascending aortic aneurysm. Continued annual follow-up recommended. This recommendation follows 2010 ACCF/AHA/AATS/ACR/ASA/SCA/SCAI/SIR/STS/SVM Guidelines for the Diagnosis and Management of Patients with Thoracic Aortic Disease. Circulation.2010; 121: [id number]. Aortic aneurysm NOS (ICD10-I71.9). 5. Prominent pulmonary trunk 3.2 cm, unchanged. 6. Hepatic steatosis. 7. Chronic extrahepatic postcholecystectomy biliary dilatation. 8. These results will be called to the ordering clinician or representative by the Radiologist Assistant, and communication documented in the PACS or [employer]. Aortic Atherosclerosis (ICD10-I70.0) and Emphysema (ICD10-J43.9). Electronically Signed   By: Francis Quam [name]   On: 01/30/2024 01:54     ASSESSMENT AND PLAN: This is a very pleasant 73 years old white female with a stage IA (T1b, N0, M0) non-small cell lung cancer, adenocarcinoma presented with right upper lobe lung nodule status post right lower lobectomy with lymph node dissection under the care of Dr. Kerrin.   The patient has been on observation since 2021 and she is feeling fine with no concerning complaints. She had repeat CT scan of the chest performed recently. Her scan showed no concerning finding for disease recurrence but there was enlarging 1.0 x 0.7 cm solid nodule with slight spiculation in the superior segment of the left lower lobe worrisome for bronchogenic malignancy. The patient had a PET scan that showed activity in this nodule. I discussed with her and her son several options for manage her condition. Assessment and Plan Assessment & Plan Left lower lobe pulmonary nodule suspicious for malignancy A 10 mm retrohilar left lower lobe pulmonary nodule identified on recent CT scan is hypermetabolic on PET scan, suggesting metastatic disease or a new  primary malignancy. The nodule is smaller than the previous 13 mm nodule from 2021. Differential diagnosis includes metastatic adenocarcinoma or a new primary lung cancer. - Referred to pulmonologist for bronchoscopy to biopsy the nodule. - Coordinated with Dr. Kerrin for potential surgical intervention or biopsy. - Will consider stereotactic body radiation therapy (SBRT) if biopsy confirms malignancy or if surgery is not feasible.  History of  stage 1A non-small cell lung cancer, status post right upper lobectomy Stage 1A non-small cell lung cancer, adenocarcinoma, treated with right upper lobectomy and lymph node dissection in February 2021. Currently under observation with no evidence of disease recurrence except for the new left lower lobe nodule. She was advised to call immediately if she has any other concerning symptoms in the interval.  The total time spent in the appointment was 30 minutes including review of chart and various tests results, discussions about plan of care and coordination of care plan .  The patient voices understanding of current disease status and treatment options and is in agreement with the current care plan.  All questions were answered. The patient knows to call the clinic with any problems, questions or concerns. We can certainly see the patient much sooner if necessary.  Disclaimer: This note was dictated with voice recognition software. Similar sounding words can inadvertently be transcribed and may not be corrected upon review.

## 2024-02-27 ENCOUNTER — Other Ambulatory Visit: Payer: Self-pay | Admitting: Nurse Practitioner

## 2024-02-27 ENCOUNTER — Telehealth: Payer: Self-pay | Admitting: Internal Medicine

## 2024-02-27 ENCOUNTER — Encounter: Payer: Self-pay | Admitting: Internal Medicine

## 2024-02-27 ENCOUNTER — Other Ambulatory Visit: Payer: Self-pay | Admitting: Internal Medicine

## 2024-02-27 ENCOUNTER — Other Ambulatory Visit: Payer: Self-pay | Admitting: Thoracic Surgery (Cardiothoracic Vascular Surgery)

## 2024-02-27 DIAGNOSIS — C3491 Malignant neoplasm of unspecified part of right bronchus or lung: Secondary | ICD-10-CM

## 2024-02-27 NOTE — Telephone Encounter (Signed)
 Scheduled the patient for next appointments. Called and left a voicemail with the details.

## 2024-03-01 DIAGNOSIS — H353132 Nonexudative age-related macular degeneration, bilateral, intermediate dry stage: Secondary | ICD-10-CM | POA: Diagnosis not present

## 2024-03-06 ENCOUNTER — Ambulatory Visit: Admitting: Thoracic Surgery (Cardiothoracic Vascular Surgery)

## 2024-03-06 ENCOUNTER — Ambulatory Visit
Attending: Thoracic Surgery (Cardiothoracic Vascular Surgery) | Admitting: Thoracic Surgery (Cardiothoracic Vascular Surgery)

## 2024-03-06 VITALS — BP 124/80 | HR 75 | Resp 20 | Ht 64.0 in | Wt 114.0 lb

## 2024-03-06 DIAGNOSIS — C3491 Malignant neoplasm of unspecified part of right bronchus or lung: Secondary | ICD-10-CM

## 2024-03-06 DIAGNOSIS — I7121 Aneurysm of the ascending aorta, without rupture: Secondary | ICD-10-CM | POA: Diagnosis not present

## 2024-03-06 NOTE — Progress Notes (Signed)
 [address] [name] [id number]             [phone]     HPI: [name] returns for an annual follow-up visit  [name] is a 73 year old woman with a history of tobacco abuse, COPD, ascending aneurysm, hypertension, hyperlipidemia, endometriosis, migraines, anxiety, thoracic aortic atherosclerosis, coronary atherosclerosis, ascending aortic aneurysm, and a stage Ia adenocarcinoma of the lung status post right upper lobectomy in February 2021.  She had a robotic right upper lobectomy in February 2021.  Postoperative course was complicated by airleak, esophagitis, gastric ulcer, and wide-complex tachycardia.  She did not require adjuvant therapy.  She has been followed by Dr. Sherrod and myself since then.  Also following her for a 4.4 cm ascending aneurysm.  She recently return for her annual follow-up visit with Dr. Sherrod.  CT showed a new 7 x 9 mm nodule in the left lower lobe centrally.  She had a PET/CT which showed nodules hypermetabolic.  There was no evidence of regional or distant metastatic disease.  Not certain whether this is a new primary or recurrence of her previous cancer.  She complains of feeling tired all the time.  Quit smoking 5 years ago around the time of her surgery.  Reluctant to consider biopsy or other invasive procedure.  Past Medical History:  Diagnosis Date   Active smoker    per pt quit 01/ 2021 smoking prior to lung lobectomy 02/ 2021 but started back smoking 06/ 2022 stated 5 cig per day   Asthma    Chronic diarrhea    Complication of anesthesia    hard to wake and ponv   Dyslipidemia    Emphysema/COPD    pulmologist--- dr kara;   no oxygen   Endometriosis    Family history of adverse reaction to anesthesia    brother--- slow to wake   Finger fracture, left    left ring and small proximal phalagel fx's   GAD (generalized anxiety disorder)    GERD (gastroesophageal reflux disease)    Hiatal hernia    History of  cervical dysplasia    s/p gyn crysurgery yrs ago   History of gastric ulcer 05/2019   post lung lobectomy w/ esophagitis   History of TIA (transient ischemic attack)    12-22-2021  per pt remote TIA > 10 yrs ago,  no residual   Hypertension    followed by cardiologist and pcp;   cardiac cath 11-20-2002 completely normal;   last nuclear stress test 08-14-2010 low risk normal perfusion no ischemia, nuclear ef 59%   Intermittent palpitations    followed by dr hilty   Lymphocytic colitis    followed by [facility]   Macular degeneration of both eyes    Migraine    Non-small cell lung cancer, right (HCC) 05/2019   surgeon-- dr kerrin / oncologist-- dr ozzie  pulmologist-- dr j. kara;   05-28-2019  s/p  right upper lobectomy w/ node dissection's,  Stage IA,  no chemo or radiation   NSVT (nonsustained ventricular tachycardia) (HCC)    hx bigemy  and psot op wide complex tachycardia s/p lung lobectomy 02/ 2021   OSA (obstructive sleep apnea)    does not wear her CPAP because it gave me respiratory issues (bronchities and pneumonia)   Osteoporosis    PONV (postoperative nausea and vomiting)    Secondary polycythemia    evaluation by dr odean ronco note in epic 11-03-2015  per note due to tobacco use and respiratory issues   Thoracic ascending aortic aneurysm 02/02/2022   Past Surgical History:  Procedure Laterality Date   BIOPSY  06/05/2019   Procedure: BIOPSY;  Surgeon: Rollin Dover, MD;  Location: [facility] ENDOSCOPY;  Service: Endoscopy;;   CARDIAC CATHETERIZATION  11/20/2002   @[facility]  by dr gamble;   completey normal;  patent coronary arteries   CATARACT EXTRACTION W/ INTRAOCULAR LENS IMPLANT Bilateral 08/2020   ESOPHAGOGASTRODUODENOSCOPY Left 06/05/2019   Procedure: ESOPHAGOGASTRODUODENOSCOPY (EGD);  Surgeon: Rollin Dover, MD;  Location: [facility] ENDOSCOPY;  Service: Endoscopy;  Laterality: Left;   GYNECOLOGIC CRYOSURGERY     many yrs ago   INTERCOSTAL NERVE BLOCK Right 05/28/2019    Procedure: Intercostal Nerve Block;  Surgeon: Kerrin Elspeth BROCKS, MD;  Location: [facility] OR;  Service: Thoracic;  Laterality: Right;   LAPAROSCOPIC CHOLECYSTECTOMY  10/15/2002   @[facility]    LUMBAR DISC SURGERY  08/15/2000   @[facility]  by dr alix;   left L3--4   (and also lumbar surgery approx. 1989)   NASAL SINUS SURGERY  2004   approx   NODE DISSECTION  05/28/2019   Procedure: Node Dissection;  Surgeon: Kerrin Elspeth BROCKS, MD;  Location: [facility] OR;  Service: Thoracic;;   OPEN REDUCTION INTERNAL FIXATION (ORIF) PROXIMAL PHALANX Left 12/29/2021   Procedure: Left ring and small finger proximal phalangeal closed reduction percutaneous pinning;  Surgeon: Alyse Agent, MD;  Location: [facility];  Service: Orthopedics;  Laterality: Left;   ROTATOR CUFF REPAIR Left    x2  last one  1990s   THORACOSCOPY  05/28/2019   THORASCOPY-WEDGE RESECTION AND  RIGHT  UPPER LOBECTOMY (Right)   THROAT SURGERY  1997   REMOVAL OF TUMOR  (PER PT BENIGN)   TONSILLECTOMY     age 76   VAGINAL HYSTERECTOMY  2000   w/  BILATERAL SALPINGOOPHORECTOMY    Current Outpatient Medications  Medication Sig Dispense Refill   ALPRAZolam  (XANAX ) 0.5 MG tablet Take 0.5 mg by mouth 3 (three) times daily as needed for anxiety or sleep.     amLODipine  (NORVASC ) 2.5 MG tablet TAKE 1 TABLET BY MOUTH DAILY. (Patient taking differently: Take 2.5 mg by mouth in the morning and at bedtime.) 180 tablet 1   aspirin  EC 81 MG tablet Take 81 mg by mouth every other day.     bismuth subsalicylate (PEPTO BISMOL) 262 MG chewable tablet Chew 524 mg by mouth as needed for diarrhea or loose stools.     BREO ELLIPTA 100-25 MCG/ACT AEPB 1 puff daily.     budesonide  (ENTOCORT EC ) 3 MG 24 hr capsule Take 3 capsules (9 mg total) by mouth in the morning. 90 capsule 12   CALCIUM  PO Take 1 tablet by mouth 2 (two) times a week.     cholestyramine  (QUESTRAN ) 4 GM/DOSE powder Take 4 g by mouth daily as needed (for diarrhea).     colestipol (COLESTID) 1  g tablet 2 tab(s) orally 2 times a day for 30 days     denosumab  (PROLIA ) 60 MG/ML SOLN injection Inject 60 mg into the skin every 6 (six) months. Administer in upper arm, thigh, or abdomen     ergocalciferol  (VITAMIN D2) 1.25 MG (50000 UT) capsule Take 50,000 Units by mouth every Saturday.     eszopiclone  3 MG TABS Take 1 tablet (3 mg total) by mouth at bedtime as needed (for sleep- Take immediately before bedtime).  0   gabapentin  (NEURONTIN ) 300 MG capsule TAKE 2 CAPSULES(600 MG) BY  MOUTH THREE TIMES DAILY 180 capsule 2   irbesartan  (AVAPRO ) 150 MG tablet TAKE 1 TABLET(150 MG) BY MOUTH DAILY 90 tablet 3   loperamide  (IMODIUM  A-D) 2 MG tablet Take 2 mg by mouth 4 (four) times daily as needed for diarrhea or loose stools.     metoprolol  succinate (TOPROL -XL) 25 MG 24 hr tablet Take 1 tablet (25 mg total) by mouth daily. 90 tablet 2   montelukast  (SINGULAIR ) 10 MG tablet Take 10 mg by mouth at bedtime.   0   Multiple Vitamins-Minerals (PRESERVISION AREDS 2) CAPS Take 1 capsule by mouth in the morning and at bedtime.     pantoprazole  (PROTONIX ) 40 MG tablet Take 1 tablet (40 mg total) by mouth 2 (two) times daily. (Patient taking differently: Take 40 mg by mouth See admin instructions. Take 40 mg by mouth before supper and once a day as needed for reflux) 60 tablet 3   rosuvastatin  (CRESTOR ) 10 MG tablet TAKE 1 TABLET(10 MG) BY MOUTH DAILY 90 tablet 2   SPIRIVA  RESPIMAT 2.5 MCG/ACT AERS INHALE 2 PUFFS INTO THE LUNGS DAILY (Patient taking differently: Inhale 2 puffs into the lungs at bedtime.) 4 g 0   UNKNOWN TO PATIENT Take 1-2 tablets by mouth See admin instructions. [employer] brand otc sleep aid: Take 1-2 tablets by mouth as needed for sleep     UNKNOWN TO PATIENT Place 1 drop into both eyes See admin instructions. Unnamed OTC eye drops for dryness: Instill 1 drop into both eyes in the morning     VENTOLIN  HFA 108 (90 BASE) MCG/ACT inhaler Inhale 2 puffs into the lungs every 6 (six) hours as needed  for wheezing or shortness of breath.     XIFAXAN 550 MG TABS tablet Take 550 mg by mouth 3 (three) times daily.     No current facility-administered medications for this visit.    Physical Exam BP 124/80   Pulse 75   Resp 20   Ht 5' 4 (1.626 m)   Wt 114 lb (51.7 kg)   SpO2 95%   BMI 19.14 kg/m  73 year old woman in no acute distress Alert and oriented x 3 with no focal neurologic deficit Lungs diminished breath sounds bilaterally, no wheezing Cardiac regular rate and rhythm No cervical or supraclavicular adenopathy  Diagnostic Tests: NUCLEAR MEDICINE PET SKULL BASE TO THIGH   TECHNIQUE: 5.6 mCi F-18 FDG was injected intravenously. Full-ring PET imaging was performed from the skull base to thigh after the radiotracer. CT data was obtained and used for attenuation correction and anatomic localization.   Fasting blood glucose: 114 mg/dl   COMPARISON:  Chest CT 01/24/2024.  PET-CT 05/08/2019   FINDINGS: Mediastinal blood pool activity: SUV max 2.6   Liver activity: SUV max NA   NECK: No hypermetabolic lymph nodes in the neck.   Incidental CT findings: None.   CHEST: 10 mm retro hilar left lower lobe pulmonary nodule of concern on recent chest CT is hypermetabolic with SUV max = 3.4. No other suspicious hypermetabolic pulmonary nodule or mass. No hypermetabolic lymphadenopathy in the mediastinum or left hilum.   Incidental CT findings: Coronary artery calcification is evident. Moderate atherosclerotic calcification noted in the thoracic aorta. 4.3 cm ascending thoracic aortic aneurysm documented on recent diagnostic chest CT. Centrilobular and paraseptal emphysema evident. Surgical scarring noted parahilar right lung.   ABDOMEN/PELVIS: No abnormal hypermetabolic activity within the liver, pancreas, adrenal glands, or spleen. No hypermetabolic lymph nodes in the abdomen or pelvis.   Incidental CT findings:  Cholecystectomy. Extrahepatic biliary duct dilatation  similar to abdomen/pelvis CT 07/28/2022. Small cyst noted interpolar left kidney. Abdominal aortic atherosclerosis.   SKELETON: No focal hypermetabolic activity to suggest skeletal metastasis.   Incidental CT findings: No worrisome lytic or sclerotic osseous abnormality.   IMPRESSION: 1. 10 mm retro hilar left lower lobe pulmonary nodule of concern on recent chest CT is hypermetabolic, consistent with metastatic disease or new primary. 2. No evidence for hypermetabolic metastatic disease in the neck, abdomen, or pelvis. 3. Aortic Atherosclerosis (ICD10-I70.0) and Emphysema (ICD10-J43.9).     Electronically Signed   By: Camellia Candle [name]   On: 02/09/2024 05:44   I personally reviewed the CT images and the PET/CT images.  There is a 10 mm left lower lobe nodule which is hypermetabolic.  Likely new primary but cannot rule out metastatic disease.  No evidence of regional or distant metastases.  Aortic and coronary atherosclerosis.  Severe emphysema.  Impression: [name] is a 73 year old woman with a history of tobacco abuse, COPD, ascending aneurysm, hypertension, hyperlipidemia, endometriosis, migraines, anxiety, thoracic aortic atherosclerosis, coronary atherosclerosis, ascending aortic aneurysm, and a stage Ia adenocarcinoma of the lung status post right upper lobectomy in February 2021.  Now found to have a new left lower lobe nodule that is hypermetabolic on PET/CT.  Left lower lobe nodule-most likely a primary bronchogenic carcinoma but cannot definitively rule out recurrence from her previous cancer.  Infectious and inflammatory nodules are also in the differential but are extremely unlikely.  Resection of the nodule would require lobectomy.  She would not be a candidate for that given her previous lobectomy on the other side and severe emphysematous changes.  She also is not interested in pursuing surgery after her complicated course thelast time.  Should be a candidate for  stereotactic radiation.  Will make a referral to radiation oncology for consideration for treatment.  I discussed the possibility of doing a robotic bronchoscopy for biopsy with her.  I informed her of the general nature of the procedure.  She understands it would be done under general anesthesia on an outpatient basis.  No incisions.  High likelihood but no guarantee of a definitive diagnosis.  I informed her of the indications, risks, benefits, and alternatives.  She understands the risks include is associated with general anesthesia such as MI, DVT, PE and also procedure specific risk such as pneumothorax and bleeding.  I do think she is at relatively high risk for bleeding given the large vessel in close proximity to the nodule.  She is reluctant to undergo biopsy and would like to be considered for empiric stereotactic radiation.  Plan: Will review her case at our multidisciplinary thoracic oncology conference on Thursday  We will go ahead and make referral to radiation oncology in the meantime.  Elspeth JAYSON Millers, MD [facility] [phone]

## 2024-03-07 ENCOUNTER — Encounter: Payer: Self-pay | Admitting: *Deleted

## 2024-03-08 ENCOUNTER — Telehealth: Payer: Self-pay | Admitting: Radiation Oncology

## 2024-03-08 ENCOUNTER — Telehealth: Payer: Self-pay

## 2024-03-08 ENCOUNTER — Encounter: Payer: Self-pay | Admitting: Radiation Oncology

## 2024-03-08 ENCOUNTER — Other Ambulatory Visit: Payer: Self-pay

## 2024-03-08 NOTE — Progress Notes (Signed)
  Radiation Oncology         [phone] ________________________________  Name: [name] MRN: [id number]  Date: 03/08/2024  DOB: [birth year]  Chart Note:  We discussed this patient in [facility] today and recommended consideration for SBRT.  I will see her soon to discuss SBRT and a genetic counseling referral.    ________________________________  Donnice LABOR. Patrcia, [name]

## 2024-03-08 NOTE — Progress Notes (Signed)
 The proposed treatment discussed in conference is for discussion purpose only and is not a binding recommendation.  The patients have not been physically examined, or presented with their treatment options.  Therefore, final treatment plans cannot be decided.

## 2024-03-08 NOTE — Telephone Encounter (Signed)
 11/13 @ 3:05 pm Left voicemail for patient to call our office to be sch for consult.

## 2024-03-08 NOTE — Telephone Encounter (Addendum)
 Thoracic Tumor Board Review  [name] was reviewed before presentation on the on the Thoracic Tumor Board on 03/08/2024. She has a personal history of non-small cell lung cancer.   [name] personal and family history is concern and it may be appropriate for her to be referred to adult genetics.  Her personal and family history includes: Complication of anesthesia - She and her brother have a history of being slow to waking up after anesthesia   Personal history of thoracic ascending aortic aneurysm at 65 Macular degeneration of both eyes  Strong family history of heart disease - maternal grandfather heart attack at 39 and 60 (passed at 59 from heart attack)  Melanie Fryer, MS, CGC  Certified Dentist  Email: [email or web address]  Phone: [phone]

## 2024-03-20 NOTE — Progress Notes (Signed)
 Thoracic Location of Tumor / Histology: Non-small Cell Lung Cancer (left lower lobe centrally)  Evelean O. Navarrete presented as referral from Dr. Elspeth BROCKS. [facility] (Cardiothoracic Surgery at [facility]) consideration for SBRT.  02/07/2024 Dr. Sherrod Sherrod NM PET Image Restage (PS) Skull Base to Thigh CLINICAL DATA:  Subsequent treatment strategy for non-small cell lung cancer.  IMPRESSION: 1. 10 mm retro hilar left lower lobe pulmonary nodule of concern on recent chest CT is hypermetabolic, consistent with metastatic disease or new primary. 2. No evidence for hypermetabolic metastatic disease in the neck, abdomen, or pelvis. 3. Aortic Atherosclerosis (ICD10-I70.0) and Emphysema (ICD10-J43.9).  01/24/2024 Dr. Sherrod Sherrod CT Chest without Contrast CLINICAL DATA:  Non-small cell lung cancer staging.   IMPRESSION: 1. 1.0 x 0.7 cm enlarging solid nodule with slight spiculation in the superior segment of the left lower lobe. Findings are highly worrisome for bronchogenic malignancy. Recommend PET-CT or tissue sampling. 2. Advanced centrilobular emphysema.  Remote right upper lobectomy. 3. Aortic and coronary artery atherosclerosis. 4. 4.1 x 4.3 cm ascending aortic aneurysm. Continued annual follow-up recommended. This recommendation follows 2010 ACCF/AHA/AATS/ACR/ASA/SCA/SCAI/SIR/STS/SVM Guidelines for the Diagnosis and Management of Patients with Thoracic Aortic Disease. Circulation.2010; 121: Z733-z630. Aortic aneurysm NOS (ICD10-I71.9). 5. Prominent pulmonary trunk 3.2 cm, unchanged. 6. Hepatic steatosis. 7. Chronic extrahepatic postcholecystectomy biliary dilatation. 8. These results will be called to the ordering clinician or representative by the Radiologist Assistant, and communication documented in the PACS or [employer]. Aortic Atherosclerosis (ICD10-I70.0) and Emphysema (ICD10-J43.9).  Past/Anticipated interventions by pulmonary, if any:  NA  Past/Anticipated interventions by cardiothoracic surgery, if any:  Dr. Elspeth C. Hendrickson   Past/Anticipated interventions by medical oncology, if any: NA  Tobacco/Marijuana/Snuff/ETOH use: Yes, tobacco and alcohol use, no drug use.  Signs/Symptoms Weight changes, if any:  No, reports has no appetite. Respiratory complaints, if any:  No Hemoptysis, if any: No Pain issues, if any:  0/10  SAFETY ISSUES: Prior radiation?  No Pacemaker/ICD?  No Possible current pregnancy? Hysterectomy Is the patient on methotrexate? No  Current Complaints / other details:  None  30 minutes spent total, including time for meaningful use questions, reviewing medication, as well as spent in face-to-face time in nurse evaluation with the patient.

## 2024-03-27 ENCOUNTER — Ambulatory Visit
Admission: RE | Admit: 2024-03-27 | Discharge: 2024-03-27 | Disposition: A | Source: Ambulatory Visit | Attending: Radiation Oncology | Admitting: Radiation Oncology

## 2024-03-27 ENCOUNTER — Ambulatory Visit
Admission: RE | Admit: 2024-03-27 | Discharge: 2024-03-27 | Disposition: A | Source: Ambulatory Visit | Attending: Radiation Oncology

## 2024-03-27 ENCOUNTER — Encounter: Payer: Self-pay | Admitting: Radiation Oncology

## 2024-03-27 VITALS — BP 121/84 | HR 76 | Temp 97.7°F | Resp 18 | Ht 64.0 in | Wt 110.0 lb

## 2024-03-27 DIAGNOSIS — R918 Other nonspecific abnormal finding of lung field: Secondary | ICD-10-CM | POA: Diagnosis not present

## 2024-03-27 DIAGNOSIS — K219 Gastro-esophageal reflux disease without esophagitis: Secondary | ICD-10-CM | POA: Diagnosis not present

## 2024-03-27 DIAGNOSIS — J45909 Unspecified asthma, uncomplicated: Secondary | ICD-10-CM | POA: Diagnosis not present

## 2024-03-27 DIAGNOSIS — E785 Hyperlipidemia, unspecified: Secondary | ICD-10-CM | POA: Diagnosis not present

## 2024-03-27 DIAGNOSIS — C3432 Malignant neoplasm of lower lobe, left bronchus or lung: Secondary | ICD-10-CM | POA: Diagnosis not present

## 2024-03-27 DIAGNOSIS — J439 Emphysema, unspecified: Secondary | ICD-10-CM | POA: Diagnosis not present

## 2024-03-27 DIAGNOSIS — R197 Diarrhea, unspecified: Secondary | ICD-10-CM | POA: Diagnosis not present

## 2024-03-27 DIAGNOSIS — N809 Endometriosis, unspecified: Secondary | ICD-10-CM | POA: Diagnosis not present

## 2024-03-27 DIAGNOSIS — F17210 Nicotine dependence, cigarettes, uncomplicated: Secondary | ICD-10-CM | POA: Diagnosis not present

## 2024-03-27 NOTE — Progress Notes (Signed)
 Radiation Oncology         [phone] ________________________________  Initial outpatient Consultation  Name: [name] MRN: [id number]  Date of Service: 03/27/2024 DOB: [birth year]  RR:Emzcndu, Ronal Czar, FNP  Kerrin Elspeth BROCKS, *   REFERRING PHYSICIAN: Kerrin Elspeth BROCKS, *  DIAGNOSIS: 73 y/o woman with putative Stage IA NSCLC in an enlarging LLL pulmonary nodule, with history of stage IA NSCLC of the RUL s/p lobectomy in 05/2019.    ICD-10-CM   1. Malignant neoplasm of bronchus of left lower lobe (HCC)  C34.32       HISTORY OF PRESENT ILLNESS: [name] is a 73 y.o. female seen at the request of Dr. Kerrin. She has a history of Stage IA, NSCLC, adenocarcinoma of the RUL lung found on low-dose lung cancer chest CT on 04/17/19. A staging PET scan on 05/08/19 was negative for metastatic disease. She elected to proceed with wedge resection on 05/28/19 under the care of Dr. Kerrin, and final surgical pathology confirmed a 1.6 cm well differentiated invasive adenocarcinoma with negative margins and lymph nodes. She has since been followed closely in observation  with both Dr. Kerrin and Dr. Sherrod in medical oncology.  A recent surveillance chest CT on 01/24/24 showed an enlarging 1 cm nodule in the superior left lower lobe. This was further evaluated with a PET scan on 02/07/24 which confirmed hypermetabolism in the LLL pulmonary nodule with no evidence of metastatic disease.     She reviewed the imaging with Dr. Kerrin and given the proximity of the nodule to large blood vessels and previous significant postop complications, she is felt to be high risk for biopsy or surgery. Her case was reviewed in our recent multidisciplinary thoracic oncology conference and this was consensus agreement as well. Therefore, she has been kindly referred to us  today to discuss the potential role of empiric SBRT without tissue diagnosis.  PREVIOUS RADIATION THERAPY:  No  PAST MEDICAL HISTORY:  Past Medical History:  Diagnosis Date   Active smoker    per pt quit 01/ 2021 smoking prior to lung lobectomy 02/ 2021 but started back smoking 06/ 2022 stated 5 cig per day   Asthma    Chronic diarrhea    Complication of anesthesia    hard to wake and ponv   Dyslipidemia    Emphysema/COPD    pulmologist--- dr kara;   no oxygen   Endometriosis    Family history of adverse reaction to anesthesia    brother--- slow to wake   Finger fracture, left    left ring and small proximal phalagel fx's   GAD (generalized anxiety disorder)    GERD (gastroesophageal reflux disease)    Hiatal hernia    History of cervical dysplasia    s/p gyn crysurgery yrs ago   History of gastric ulcer 05/2019   post lung lobectomy w/ esophagitis   History of TIA (transient ischemic attack)    12-22-2021  per pt remote TIA > 10 yrs ago,  no residual   Hypertension    followed by cardiologist and pcp;   cardiac cath 11-20-2002 completely normal;   last nuclear stress test 08-14-2010 low risk normal perfusion no ischemia, nuclear ef 59%   Intermittent palpitations    followed by dr hilty   Lymphocytic colitis    followed by [facility]   Macular degeneration of both eyes    Migraine    Non-small cell lung cancer, right (HCC) 05/2019   surgeon-- dr kerrin /  oncologist-- dr ozzie  pulmologist-- dr j. kara;   05-28-2019  s/p  right upper lobectomy w/ node dissection's,  Stage IA,  no chemo or radiation   NSVT (nonsustained ventricular tachycardia) (HCC)    hx bigemy  and psot op wide complex tachycardia s/p lung lobectomy 02/ 2021   OSA (obstructive sleep apnea)    does not wear her CPAP because it gave me respiratory issues (bronchities and pneumonia)   Osteoporosis    PONV (postoperative nausea and vomiting)    Secondary polycythemia    evaluation by dr odean ronco note in epic 11-03-2015  per note due to tobacco use and respiratory issues   Thoracic ascending aortic  aneurysm 02/02/2022      PAST SURGICAL HISTORY: Past Surgical History:  Procedure Laterality Date   BIOPSY  06/05/2019   Procedure: BIOPSY;  Surgeon: Rollin Dover, MD;  Location: [facility] ENDOSCOPY;  Service: Endoscopy;;   CARDIAC CATHETERIZATION  11/20/2002   @[facility]  by dr gamble;   completey normal;  patent coronary arteries   CATARACT EXTRACTION W/ INTRAOCULAR LENS IMPLANT Bilateral 08/2020   ESOPHAGOGASTRODUODENOSCOPY Left 06/05/2019   Procedure: ESOPHAGOGASTRODUODENOSCOPY (EGD);  Surgeon: Rollin Dover, MD;  Location: [facility] ENDOSCOPY;  Service: Endoscopy;  Laterality: Left;   GYNECOLOGIC CRYOSURGERY     many yrs ago   INTERCOSTAL NERVE BLOCK Right 05/28/2019   Procedure: Intercostal Nerve Block;  Surgeon: Kerrin Elspeth BROCKS, MD;  Location: [facility] OR;  Service: Thoracic;  Laterality: Right;   LAPAROSCOPIC CHOLECYSTECTOMY  10/15/2002   @[facility]    LUMBAR DISC SURGERY  08/15/2000   @[facility]  by dr alix;   left L3--4   (and also lumbar surgery approx. 1989)   NASAL SINUS SURGERY  2004   approx   NODE DISSECTION  05/28/2019   Procedure: Node Dissection;  Surgeon: Kerrin Elspeth BROCKS, MD;  Location[facility] OR;  Service: Thoracic;;   OPEN REDUCTION INTERNAL FIXATION (ORIF) PROXIMAL PHALANX Left 12/29/2021   Procedure: Left ring and small finger proximal phalangeal closed reduction percutaneous pinning;  Surgeon: Alyse Agent, MD;  Location: [facility];  Service: Orthopedics;  Laterality: Left;   ROTATOR CUFF REPAIR Left    x2  last one  1990s   THORACOSCOPY  05/28/2019   THORASCOPY-WEDGE RESECTION AND  RIGHT  UPPER LOBECTOMY (Right)   THROAT SURGERY  1997   REMOVAL OF TUMOR  (PER PT BENIGN)   TONSILLECTOMY     age 24   VAGINAL HYSTERECTOMY  2000   w/  BILATERAL SALPINGOOPHORECTOMY    FAMILY HISTORY:  Family History  Problem Relation Age of Onset   Hypertension Mother    Liver cancer Mother    Lung cancer Mother    Hypertension Brother    Heart disease Brother    Hypertension  Maternal Grandfather    Heart disease Maternal Grandfather    Heart attack Maternal Grandfather 28       second heart attack at 7 which lead to his death    SOCIAL HISTORY:  Social History   Socioeconomic History   Marital status: Divorced    Spouse name: Not on file   Number of children: Not on file   Years of education: Not on file   Highest education level: Not on file  Occupational History   Not on file  Tobacco Use   Smoking status: Every Day    Current packs/day: 0.00    Average packs/day: 1 pack/day for 42.0 years (42.0 ttl pk-yrs)    Types: Cigarettes  Start date: 05/17/1977    Last attempt to quit: 05/18/2019    Years since quitting: 4.8   Smokeless tobacco: Never   Tobacco comments:    Currently 4 to 5 cigarettes/day  Vaping Use   Vaping status: Never Used  Substance and Sexual Activity   Alcohol use: Yes    Alcohol/week: 15.0 standard drinks of alcohol    Types: 15 Standard drinks or equivalent per week   Drug use: No   Sexual activity: Not Currently    Birth control/protection: Surgical    Comment: INTERCOUSRE AGE 11,SEXUAL PARTNERS MORE THAN 5  Other Topics Concern   Not on file  Social History Narrative   Not on file   Social Drivers of Health   Financial Resource Strain: Not on file  Food Insecurity: No Food Insecurity (03/27/2024)   Hunger Vital Sign    Worried About Running Out of Food in the Last Year: Never true    Ran Out of Food in the Last Year: Never true  Transportation Needs: No Transportation Needs (03/27/2024)   PRAPARE - Administrator, Civil Service (Medical): No    Lack of Transportation (Non-Medical): No  Physical Activity: Not on file  Stress: Not on file  Social Connections: Not on file  Intimate Partner Violence: Not At Risk (03/27/2024)   Humiliation, Afraid, Rape, and Kick questionnaire    Fear of Current or Ex-Partner: No    Emotionally Abused: No    Physically Abused: No    Sexually Abused: No     ALLERGIES: Iodinated contrast media, Ioxaglate, Omnipaque  [iohexol ], Buprenorphine hcl, Codeine, Erythromycin, Morphine and codeine, Nicoderm [nicotine ], and Oyster extract  MEDICATIONS:  Current Outpatient Medications  Medication Sig Dispense Refill   ALPRAZolam  (XANAX ) 0.5 MG tablet Take 0.5 mg by mouth 3 (three) times daily as needed for anxiety or sleep.     amLODipine  (NORVASC ) 2.5 MG tablet TAKE 1 TABLET BY MOUTH DAILY. (Patient taking differently: Take 2.5 mg by mouth in the morning and at bedtime.) 180 tablet 1   aspirin  EC 81 MG tablet Take 81 mg by mouth every other day.     bismuth subsalicylate (PEPTO BISMOL) 262 MG chewable tablet Chew 524 mg by mouth as needed for diarrhea or loose stools.     BREO ELLIPTA 100-25 MCG/ACT AEPB 1 puff daily.     budesonide  (ENTOCORT EC ) 3 MG 24 hr capsule Take 3 capsules (9 mg total) by mouth in the morning. 90 capsule 12   CALCIUM  PO Take 1 tablet by mouth 2 (two) times a week.     cholestyramine  (QUESTRAN ) 4 GM/DOSE powder Take 4 g by mouth daily as needed (for diarrhea).     colestipol (COLESTID) 1 g tablet 2 tab(s) orally 2 times a day for 30 days     denosumab  (PROLIA ) 60 MG/ML SOLN injection Inject 60 mg into the skin every 6 (six) months. Administer in upper arm, thigh, or abdomen     ergocalciferol  (VITAMIN D2) 1.25 MG (50000 UT) capsule Take 50,000 Units by mouth every Saturday.     eszopiclone  3 MG TABS Take 1 tablet (3 mg total) by mouth at bedtime as needed (for sleep- Take immediately before bedtime).  0   gabapentin  (NEURONTIN ) 300 MG capsule TAKE 2 CAPSULES(600 MG) BY MOUTH THREE TIMES DAILY 180 capsule 2   irbesartan  (AVAPRO ) 150 MG tablet TAKE 1 TABLET(150 MG) BY MOUTH DAILY 90 tablet 3   loperamide  (IMODIUM  A-D) 2 MG tablet Take 2 mg  by mouth 4 (four) times daily as needed for diarrhea or loose stools.     metoprolol  succinate (TOPROL -XL) 25 MG 24 hr tablet Take 1 tablet (25 mg total) by mouth daily. 90 tablet 2   montelukast   (SINGULAIR ) 10 MG tablet Take 10 mg by mouth at bedtime.   0   Multiple Vitamins-Minerals (PRESERVISION AREDS 2) CAPS Take 1 capsule by mouth in the morning and at bedtime.     pantoprazole  (PROTONIX ) 40 MG tablet Take 1 tablet (40 mg total) by mouth 2 (two) times daily. (Patient taking differently: Take 40 mg by mouth See admin instructions. Take 40 mg by mouth before supper and once a day as needed for reflux) 60 tablet 3   rosuvastatin  (CRESTOR ) 10 MG tablet TAKE 1 TABLET(10 MG) BY MOUTH DAILY 90 tablet 2   SPIRIVA  RESPIMAT 2.5 MCG/ACT AERS INHALE 2 PUFFS INTO THE LUNGS DAILY (Patient taking differently: Inhale 2 puffs into the lungs at bedtime.) 4 g 0   UNKNOWN TO PATIENT Take 1-2 tablets by mouth See admin instructions. [employer] brand otc sleep aid: Take 1-2 tablets by mouth as needed for sleep     UNKNOWN TO PATIENT Place 1 drop into both eyes See admin instructions. Unnamed OTC eye drops for dryness: Instill 1 drop into both eyes in the morning     VENTOLIN  HFA 108 (90 BASE) MCG/ACT inhaler Inhale 2 puffs into the lungs every 6 (six) hours as needed for wheezing or shortness of breath.     XIFAXAN 550 MG TABS tablet Take 550 mg by mouth 3 (three) times daily.     No current facility-administered medications for this encounter.    REVIEW OF SYSTEMS:  On review of systems, the patient reports that she is doing well overall. She denies any chest pain, shortness of breath, cough, fevers, chills, night sweats, unintended weight changes. She denies any bowel or bladder disturbances, and denies abdominal pain, nausea or vomiting. She denies any new musculoskeletal or joint aches or pains.  A complete review of systems is obtained and is otherwise negative.    PHYSICAL EXAM:  Wt Readings from Last 3 Encounters:  03/27/24 110 lb (49.9 kg)  03/06/24 114 lb (51.7 kg)  02/23/24 113 lb (51.3 kg)   Temp Readings from Last 3 Encounters:  03/27/24 97.7 F (36.5 C)  02/23/24 (!) 97 F (36.1 C)  (Temporal)  01/31/24 97.7 F (36.5 C)   BP Readings from Last 3 Encounters:  03/27/24 121/84  03/06/24 124/80  02/23/24 123/83   Pulse Readings from Last 3 Encounters:  03/27/24 76  03/06/24 75  02/23/24 70   Pain Assessment Pain Score: 0-No pain/10  In general this is a well appearing Caucasian woman in no acute distress. She's alert and oriented x4 and appropriate throughout the examination. Cardiopulmonary assessment is negative for acute distress and she exhibits normal effort.     KPS = 90  100 - Normal; no complaints; no evidence of disease. 90   - Able to carry on normal activity; minor signs or symptoms of disease. 80   - Normal activity with effort; some signs or symptoms of disease. 52   - Cares for self; unable to carry on normal activity or to do active work. 60   - Requires occasional assistance, but is able to care for most of his personal needs. 50   - Requires considerable assistance and frequent medical care. 40   - Disabled; requires special care and assistance. 30   -  Severely disabled; hospital admission is indicated although death not imminent. 20   - Very sick; hospital admission necessary; active supportive treatment necessary. 10   - Moribund; fatal processes progressing rapidly. 0     - Dead  Karnofsky DA, Abelmann WH, Craver LS and Burchenal [facility] [phone]) The use of the nitrogen mustards in the palliative treatment of carcinoma: with particular reference to bronchogenic carcinoma Cancer 1 634-56  LABORATORY DATA:  Lab Results  Component Value Date   WBC 10.1 01/24/2024   HGB 15.4 (H) 01/24/2024   HCT 45.5 01/24/2024   MCV 98.7 01/24/2024   PLT 236 01/24/2024   Lab Results  Component Value Date   NA 137 01/24/2024   K 3.5 01/24/2024   CL 97 (L) 01/24/2024   CO2 32 01/24/2024   Lab Results  Component Value Date   ALT 18 01/24/2024   AST 31 01/24/2024   ALKPHOS 77 01/24/2024   BILITOT 0.8 01/24/2024     RADIOGRAPHY: No results found.     IMPRESSION/PLAN: 1. 73 y.o. woman with putative Stage IA NSCLC in an enlarging LLL pulmonary nodule, with history of stage IA NSCLC of the RUL s/p lobectomy in 05/2019.  Today, we talked to the patient and her son about the findings and workup thus far. We discussed the natural history of lung cancer and general treatment, highlighting the role of radiotherapy in the management. In her case, our recommendation is for a 5 fraction course of SBRT directed to the enlarging LLL nodule. We discussed the available radiation techniques, and focused on the details and logistics of delivery. We reviewed the anticipated acute and late sequelae associated with radiation in this setting. The patient was encouraged to ask questions that were answered to her stated satisfaction.  At the end of our discussion, the patient is interested in proceeding with the recommended 5 fraction course of SBRT directed to the enlarging LLL nodule. She has freely signed written consent to proceed today in the office and a copy of this document will be placed in her medical record. She is tentatively scheduled for CT Simulation at 10 am on 04/06/24, so we will share our discussion with Dr. Kerrin and Dr. Sherrod and proceed with treatment planning accordingly, in anticipation of beginning her treatments in the near future. We enjoyed meeting her and her son today and look forward to continuing to participate in her care.  We personally spent 60 minutes in this encounter including chart review, reviewing radiological studies, meeting face-to-face with the patient, entering orders and completing documentation.    Sabra MICAEL Rusk, PA-C    Donnice Barge, MD  [facility]  Radiation Oncology Direct Dial: [phone]  Fax: [phone] .com  Skype  LinkedIn   This document serves as a record of services personally performed by Donnice Barge, MD and Sabra Rusk, PA-C. It was created on their behalf by  Izetta Neither, a trained medical scribe. The creation of this record is based on the scribe's personal observations and the provider's statements to them. This document has been checked and approved by the attending provider.

## 2024-03-31 DIAGNOSIS — H353132 Nonexudative age-related macular degeneration, bilateral, intermediate dry stage: Secondary | ICD-10-CM | POA: Diagnosis not present

## 2024-04-02 ENCOUNTER — Telehealth: Payer: Self-pay | Admitting: *Deleted

## 2024-04-02 NOTE — Telephone Encounter (Signed)
Returned patient's phone call, lvm for a return call 

## 2024-04-06 ENCOUNTER — Telehealth: Payer: Self-pay | Admitting: Radiation Oncology

## 2024-04-06 ENCOUNTER — Ambulatory Visit
Admission: RE | Admit: 2024-04-06 | Discharge: 2024-04-06 | Attending: Radiation Oncology | Admitting: Radiation Oncology

## 2024-04-06 DIAGNOSIS — Z510 Encounter for antineoplastic radiation therapy: Secondary | ICD-10-CM | POA: Diagnosis present

## 2024-04-06 DIAGNOSIS — C3432 Malignant neoplasm of lower lobe, left bronchus or lung: Secondary | ICD-10-CM | POA: Diagnosis present

## 2024-04-06 NOTE — Telephone Encounter (Signed)
 Received an email from CT Sim that patient wanted amount of pocket treatment estimate.   Called patient and left her a voicemail that her out of pocket max is $4000 and that would be the cost she is responsible for towards her treatment.

## 2024-04-07 DIAGNOSIS — C3432 Malignant neoplasm of lower lobe, left bronchus or lung: Secondary | ICD-10-CM | POA: Insufficient documentation

## 2024-04-07 NOTE — Progress Notes (Signed)
°  Radiation Oncology         [phone]) [phone] ________________________________  Name: [name] MRN: [id number]  Date: 04/06/2024  DOB: January 13, 1951  STEREOTACTIC BODY RADIOTHERAPY SIMULATION AND TREATMENT PLANNING NOTE    ICD-10-CM   1. Primary cancer of left lower lobe of lung (HCC)  C34.32       DIAGNOSIS:  73 y/o woman with putative Stage IA NSCLC in an enlarging LLL pulmonary nodule, with history of stage IA NSCLC of the RUL s/p lobectomy in 05/2019.  NARRATIVE:  The patient was brought to the CT Simulation planning suite.  Identity was confirmed.  All relevant records and images related to the planned course of therapy were reviewed.  The patient freely provided informed written consent to proceed with treatment after reviewing the details related to the planned course of therapy. The consent form was witnessed and verified by the simulation staff.  Then, the patient was set-up in a stable reproducible  supine position for radiation therapy.  A BodyFix immobilization pillow was fabricated for reproducible positioning.  Then I personally applied the abdominal compression paddle to limit respiratory excursion.  4D respiratoy motion management CT images were obtained.  Surface markings were placed.  The CT images were loaded into the planning software.  Then, using Cine, MIP, and standard views, the internal target volume (ITV) and planning target volumes (PTV) were delinieated, and avoidance structures were contoured.  Treatment planning then occurred.  The radiation prescription was entered and confirmed.  A total of two complex treatment devices were fabricated in the form of the BodyFix immobilization pillow and a neck accuform cushion.  I have requested : 3D Simulation  I have requested a DVH of the following structures: Heart, Lungs, Esophagus, Chest Wall, Brachial Plexus, Major Blood Vessels, and targets.  SPECIAL TREATMENT PROCEDURE:  The planned course of therapy using radiation  constitutes a special treatment procedure. Special care is required in the management of this patient for the following reasons. This treatment constitutes a Special Treatment Procedure for the following reason: [ High dose per fraction requiring special monitoring for increased toxicities of treatment including daily imaging..  The special nature of the planned course of radiotherapy will require increased physician supervision and oversight to ensure patient's safety with optimal treatment outcomes.  This requires extended time and effort.    RESPIRATORY MOTION MANAGEMENT SIMULATION:  In order to account for effect of respiratory motion on target structures and other organs in the planning and delivery of radiotherapy, this patient underwent respiratory motion management simulation.  To accomplish this, when the patient was brought to the CT simulation planning suite, 4D respiratoy motion management CT images were obtained.  The CT images were loaded into the planning software.  Then, using a variety of tools including Cine, MIP, and standard views, the target volume and planning target volumes (PTV) were delineated.  Avoidance structures were contoured.  Treatment planning then occurred.  Dose volume histograms were generated and reviewed for each of the requested structure.  The resulting plan was carefully reviewed and approved today.  PLAN:  The patient will receive 60 Gy in 5 fractions.  ________________________________  [name], [name]

## 2024-04-09 DIAGNOSIS — Z510 Encounter for antineoplastic radiation therapy: Secondary | ICD-10-CM | POA: Diagnosis not present

## 2024-04-12 ENCOUNTER — Ambulatory Visit: Admission: RE | Admit: 2024-04-12

## 2024-04-12 ENCOUNTER — Other Ambulatory Visit: Payer: Self-pay

## 2024-04-12 DIAGNOSIS — Z510 Encounter for antineoplastic radiation therapy: Secondary | ICD-10-CM | POA: Diagnosis not present

## 2024-04-12 LAB — RAD ONC ARIA SESSION SUMMARY
Course Elapsed Days: 0
Plan Fractions Treated to Date: 1
Plan Prescribed Dose Per Fraction: 12 Gy
Plan Total Fractions Prescribed: 5
Plan Total Prescribed Dose: 60 Gy
Reference Point Dosage Given to Date: 12 Gy
Reference Point Session Dosage Given: 12 Gy
Session Number: 1

## 2024-04-15 ENCOUNTER — Other Ambulatory Visit: Payer: Self-pay

## 2024-04-15 ENCOUNTER — Ambulatory Visit

## 2024-04-15 DIAGNOSIS — Z510 Encounter for antineoplastic radiation therapy: Secondary | ICD-10-CM | POA: Diagnosis not present

## 2024-04-15 LAB — RAD ONC ARIA SESSION SUMMARY
Course Elapsed Days: 3
Plan Fractions Treated to Date: 2
Plan Prescribed Dose Per Fraction: 12 Gy
Plan Total Fractions Prescribed: 5
Plan Total Prescribed Dose: 60 Gy
Reference Point Dosage Given to Date: 24 Gy
Reference Point Session Dosage Given: 12 Gy
Session Number: 2

## 2024-04-16 ENCOUNTER — Ambulatory Visit

## 2024-04-17 ENCOUNTER — Ambulatory Visit

## 2024-04-17 ENCOUNTER — Other Ambulatory Visit: Payer: Self-pay

## 2024-04-17 ENCOUNTER — Ambulatory Visit
Admission: RE | Admit: 2024-04-17 | Discharge: 2024-04-17 | Disposition: A | Discharge status: Home / Self Care | Source: Ambulatory Visit | Attending: Radiation Oncology | Admitting: Radiation Oncology

## 2024-04-17 DIAGNOSIS — Z510 Encounter for antineoplastic radiation therapy: Secondary | ICD-10-CM | POA: Diagnosis not present

## 2024-04-17 LAB — RAD ONC ARIA SESSION SUMMARY
Course Elapsed Days: 5
Plan Fractions Treated to Date: 3
Plan Prescribed Dose Per Fraction: 12 Gy
Plan Total Fractions Prescribed: 5
Plan Total Prescribed Dose: 60 Gy
Reference Point Dosage Given to Date: 36 Gy
Reference Point Session Dosage Given: 12 Gy
Session Number: 3

## 2024-04-18 ENCOUNTER — Ambulatory Visit

## 2024-04-23 ENCOUNTER — Other Ambulatory Visit: Payer: Self-pay

## 2024-04-23 ENCOUNTER — Ambulatory Visit
Admission: RE | Admit: 2024-04-23 | Discharge: 2024-04-23 | Disposition: A | Discharge status: Home / Self Care | Source: Ambulatory Visit | Attending: Radiation Oncology | Admitting: Radiation Oncology

## 2024-04-23 DIAGNOSIS — Z510 Encounter for antineoplastic radiation therapy: Secondary | ICD-10-CM | POA: Diagnosis not present

## 2024-04-23 LAB — RAD ONC ARIA SESSION SUMMARY
Course Elapsed Days: 11
Plan Fractions Treated to Date: 4
Plan Prescribed Dose Per Fraction: 12 Gy
Plan Total Fractions Prescribed: 5
Plan Total Prescribed Dose: 60 Gy
Reference Point Dosage Given to Date: 48 Gy
Reference Point Session Dosage Given: 12 Gy
Session Number: 4

## 2024-04-24 ENCOUNTER — Ambulatory Visit

## 2024-04-24 ENCOUNTER — Telehealth: Payer: Self-pay | Admitting: Internal Medicine

## 2024-04-24 NOTE — Telephone Encounter (Signed)
 Called pts left a voice mail of the changes of their appt

## 2024-04-25 ENCOUNTER — Ambulatory Visit
Admission: RE | Admit: 2024-04-25 | Discharge: 2024-04-25 | Disposition: A | Discharge status: Home / Self Care | Source: Ambulatory Visit | Attending: Radiation Oncology | Admitting: Radiation Oncology

## 2024-04-25 ENCOUNTER — Other Ambulatory Visit: Payer: Self-pay

## 2024-04-25 DIAGNOSIS — Z510 Encounter for antineoplastic radiation therapy: Secondary | ICD-10-CM | POA: Diagnosis not present

## 2024-04-25 LAB — RAD ONC ARIA SESSION SUMMARY
Course Elapsed Days: 13
Plan Fractions Treated to Date: 5
Plan Prescribed Dose Per Fraction: 12 Gy
Plan Total Fractions Prescribed: 5
Plan Total Prescribed Dose: 60 Gy
Reference Point Dosage Given to Date: 60 Gy
Reference Point Session Dosage Given: 12 Gy
Session Number: 5

## 2024-04-26 NOTE — Radiation Completion Notes (Signed)
 Patient Name: [name], FENTER MRN: [id number] Date of Birth: 11/30/1950 Referring Physician: ELSPETH MILLERS, [name] Date of Service: 2024-04-26 Radiation Oncologist: Adina Barge, [name] [facility]                             RADIATION ONCOLOGY END OF TREATMENT NOTE     Diagnosis: C34.32 Malignant neoplasm of lower lobe, left bronchus or lung Staging on 2021-01-26: Adenocarcinoma of right lung, stage 1 (HCC) T=cT1b, N=cN0, M=cM0 Intent: Curative     ==========DELIVERED PLANS==========  First Treatment Date: 2024-04-12 Last Treatment Date: 2024-04-25   Plan Name: Lung_L_SBRT Site: Lung, Left Technique: SBRT/SRT-IMRT Mode: Photon Dose Per Fraction: 12 Gy Prescribed Dose (Delivered / Prescribed): 60 Gy / 60 Gy Prescribed Fxs (Delivered / Prescribed): 5 / 5     ==========ON TREATMENT VISIT DATES========== 2024-04-12, 2024-04-15, 2024-04-17, 2024-04-23, 2024-04-23, 2024-04-25     ==========UPCOMING VISITS========== 06/26/2024 [facility] EST PT 15 Sherrod Sherrod, MD  06/18/2024 [facility] LAB ONLY [facility]        ==========APPENDIX - ON TREATMENT VISIT NOTES==========   See weekly On Treatment Notes in Epic for details in the Media tab (listed as Progress notes on the On Treatment Visit Dates listed above).

## 2024-04-27 ENCOUNTER — Ambulatory Visit

## 2024-04-30 ENCOUNTER — Ambulatory Visit

## 2024-05-01 ENCOUNTER — Ambulatory Visit

## 2024-05-03 ENCOUNTER — Ambulatory Visit

## 2024-05-08 LAB — LAB REPORT - SCANNED: EGFR: 95

## 2024-06-18 ENCOUNTER — Inpatient Hospital Stay

## 2024-06-26 ENCOUNTER — Inpatient Hospital Stay: Admitting: Internal Medicine
